# Patient Record
Sex: Male | Born: 1945
Health system: Southern US, Community
[De-identification: ages and names within clinical notes are randomized; demographics above are authoritative.]

## PROBLEM LIST (undated history)

## (undated) DIAGNOSIS — N529 Male erectile dysfunction, unspecified: Secondary | ICD-10-CM

## (undated) DIAGNOSIS — N200 Calculus of kidney: Secondary | ICD-10-CM

## (undated) DIAGNOSIS — R7303 Prediabetes: Secondary | ICD-10-CM

## (undated) DIAGNOSIS — K5730 Diverticulosis of large intestine without perforation or abscess without bleeding: Secondary | ICD-10-CM

## (undated) DIAGNOSIS — I10 Essential (primary) hypertension: Secondary | ICD-10-CM

## (undated) DIAGNOSIS — N201 Calculus of ureter: Secondary | ICD-10-CM

## (undated) DIAGNOSIS — N183 Chronic kidney disease, stage 3 unspecified: Secondary | ICD-10-CM

## (undated) DIAGNOSIS — D649 Anemia, unspecified: Secondary | ICD-10-CM

## (undated) DIAGNOSIS — E782 Mixed hyperlipidemia: Secondary | ICD-10-CM

## (undated) DIAGNOSIS — N401 Enlarged prostate with lower urinary tract symptoms: Secondary | ICD-10-CM

## (undated) DIAGNOSIS — Z972 Presence of dental prosthetic device (complete) (partial): Secondary | ICD-10-CM

## (undated) DIAGNOSIS — I4510 Unspecified right bundle-branch block: Secondary | ICD-10-CM

## (undated) DIAGNOSIS — Z8719 Personal history of other diseases of the digestive system: Secondary | ICD-10-CM

## (undated) DIAGNOSIS — M069 Rheumatoid arthritis, unspecified: Secondary | ICD-10-CM

## (undated) DIAGNOSIS — Z87898 Personal history of other specified conditions: Secondary | ICD-10-CM

## (undated) DIAGNOSIS — Z8639 Personal history of other endocrine, nutritional and metabolic disease: Secondary | ICD-10-CM

## (undated) DIAGNOSIS — Z87448 Personal history of other diseases of urinary system: Secondary | ICD-10-CM

## (undated) DIAGNOSIS — Z8739 Personal history of other diseases of the musculoskeletal system and connective tissue: Secondary | ICD-10-CM

## (undated) HISTORY — PX: TONSILLECTOMY: SUR1361

## (undated) HISTORY — PX: ANKLE SURGERY: SHX546

## (undated) HISTORY — DX: Essential (primary) hypertension: I10

## (undated) HISTORY — PX: INGUINAL HERNIA REPAIR: SUR1180

## (undated) HISTORY — PX: HEMATOMA EVACUATION: SHX5118

## (undated) HISTORY — PX: APPENDECTOMY: SHX54

---

## 1998-01-27 ENCOUNTER — Ambulatory Visit (HOSPITAL_BASED_OUTPATIENT_CLINIC_OR_DEPARTMENT_OTHER): Admission: RE | Admit: 1998-01-27 | Discharge: 1998-01-27 | Payer: Self-pay | Admitting: Orthopedic Surgery

## 2000-04-11 ENCOUNTER — Encounter: Admission: RE | Admit: 2000-04-11 | Discharge: 2000-04-11 | Payer: Self-pay | Admitting: *Deleted

## 2000-04-11 ENCOUNTER — Encounter: Payer: Self-pay | Admitting: *Deleted

## 2002-10-24 HISTORY — PX: PLANTAR FASCIA SURGERY: SHX746

## 2011-05-10 ENCOUNTER — Encounter: Payer: Self-pay | Admitting: Family Medicine

## 2011-05-10 DIAGNOSIS — I10 Essential (primary) hypertension: Secondary | ICD-10-CM | POA: Insufficient documentation

## 2011-05-10 DIAGNOSIS — E781 Pure hyperglyceridemia: Secondary | ICD-10-CM | POA: Insufficient documentation

## 2011-05-10 DIAGNOSIS — R739 Hyperglycemia, unspecified: Secondary | ICD-10-CM | POA: Insufficient documentation

## 2012-10-03 DIAGNOSIS — I10 Essential (primary) hypertension: Secondary | ICD-10-CM | POA: Diagnosis not present

## 2012-10-03 DIAGNOSIS — E785 Hyperlipidemia, unspecified: Secondary | ICD-10-CM | POA: Diagnosis not present

## 2012-10-03 DIAGNOSIS — R7309 Other abnormal glucose: Secondary | ICD-10-CM | POA: Diagnosis not present

## 2012-10-05 DIAGNOSIS — I10 Essential (primary) hypertension: Secondary | ICD-10-CM | POA: Diagnosis not present

## 2012-10-05 DIAGNOSIS — E785 Hyperlipidemia, unspecified: Secondary | ICD-10-CM | POA: Diagnosis not present

## 2012-10-05 DIAGNOSIS — Z125 Encounter for screening for malignant neoplasm of prostate: Secondary | ICD-10-CM | POA: Diagnosis not present

## 2012-12-22 ENCOUNTER — Encounter: Payer: Self-pay | Admitting: *Deleted

## 2012-12-22 ENCOUNTER — Encounter: Payer: Self-pay | Admitting: Physician Assistant

## 2013-03-19 ENCOUNTER — Encounter: Payer: Self-pay | Admitting: Physician Assistant

## 2013-04-11 ENCOUNTER — Encounter: Payer: Self-pay | Admitting: Physician Assistant

## 2013-04-11 ENCOUNTER — Ambulatory Visit (INDEPENDENT_AMBULATORY_CARE_PROVIDER_SITE_OTHER): Payer: Medicare Other | Admitting: Physician Assistant

## 2013-04-11 VITALS — BP 122/84 | HR 68 | Temp 98.0°F | Resp 18 | Ht 64.5 in | Wt 187.0 lb

## 2013-04-11 DIAGNOSIS — R7309 Other abnormal glucose: Secondary | ICD-10-CM | POA: Diagnosis not present

## 2013-04-11 DIAGNOSIS — R739 Hyperglycemia, unspecified: Secondary | ICD-10-CM

## 2013-04-11 DIAGNOSIS — E785 Hyperlipidemia, unspecified: Secondary | ICD-10-CM

## 2013-04-11 DIAGNOSIS — I10 Essential (primary) hypertension: Secondary | ICD-10-CM | POA: Diagnosis not present

## 2013-04-11 LAB — COMPLETE METABOLIC PANEL WITH GFR
ALT: 20 U/L (ref 0–53)
AST: 18 U/L (ref 0–37)
Albumin: 4.8 g/dL (ref 3.5–5.2)
Alkaline Phosphatase: 52 U/L (ref 39–117)
BUN: 16 mg/dL (ref 6–23)
CO2: 29 mEq/L (ref 19–32)
Calcium: 11.1 mg/dL — ABNORMAL HIGH (ref 8.4–10.5)
Chloride: 104 mEq/L (ref 96–112)
Creat: 1.41 mg/dL — ABNORMAL HIGH (ref 0.50–1.35)
GFR, Est African American: 60 mL/min
GFR, Est Non African American: 52 mL/min — ABNORMAL LOW
Glucose, Bld: 95 mg/dL (ref 70–99)
Potassium: 4.6 mEq/L (ref 3.5–5.3)
Sodium: 142 mEq/L (ref 135–145)
Total Bilirubin: 0.7 mg/dL (ref 0.3–1.2)
Total Protein: 7.3 g/dL (ref 6.0–8.3)

## 2013-04-11 LAB — HEMOGLOBIN A1C
Hgb A1c MFr Bld: 5.7 % — ABNORMAL HIGH (ref <5.7)
Mean Plasma Glucose: 117 mg/dL — ABNORMAL HIGH (ref <117)

## 2013-04-11 LAB — LIPID PANEL
Cholesterol: 231 mg/dL — ABNORMAL HIGH (ref 0–200)
HDL: 39 mg/dL — ABNORMAL LOW (ref >39)
LDL Cholesterol: 141 mg/dL — ABNORMAL HIGH (ref 0–99)
Total CHOL/HDL Ratio: 5.9 Ratio
Triglycerides: 256 mg/dL — ABNORMAL HIGH (ref <150)
VLDL: 51 mg/dL — ABNORMAL HIGH (ref 0–40)

## 2013-04-11 MED ORDER — AMLODIPINE BESYLATE 10 MG PO TABS: 10.0000 mg | ORAL_TABLET | Freq: Every day | ORAL | Status: DC

## 2013-04-11 MED ORDER — BENAZEPRIL HCL 20 MG PO TABS: 20.0000 mg | ORAL_TABLET | Freq: Every day | ORAL | Status: DC

## 2013-04-11 MED ORDER — METOPROLOL SUCCINATE ER 25 MG PO TB24: 25.0000 mg | ORAL_TABLET | Freq: Every day | ORAL | Status: DC

## 2013-04-11 MED ORDER — PRAVASTATIN SODIUM 20 MG PO TABS: 20.0000 mg | ORAL_TABLET | Freq: Every day | ORAL | Status: DC

## 2013-04-12 ENCOUNTER — Ambulatory Visit: Payer: Self-pay | Admitting: Physician Assistant

## 2013-04-12 ENCOUNTER — Telehealth: Payer: Self-pay | Admitting: Family Medicine

## 2013-04-12 DIAGNOSIS — Z79899 Other long term (current) drug therapy: Secondary | ICD-10-CM

## 2013-04-12 DIAGNOSIS — E785 Hyperlipidemia, unspecified: Secondary | ICD-10-CM

## 2013-04-12 MED ORDER — PRAVASTATIN SODIUM 40 MG PO TABS: 40.0000 mg | ORAL_TABLET | Freq: Every day | ORAL | Status: DC

## 2013-04-12 NOTE — Progress Notes (Signed)
Patient ID: Mike Davila MRN: [id number], DOB: 09/19/1946, 67 y.o. Date of Encounter: @DATE @  Chief Complaint:  Chief Complaint  Patient presents with  . 6 mth check up    is fasting    HPI: 67 y.o. year old male  presents for routine f/u OV. He has no complaints. Has been feeling good. Runs a dairy farm. At [place] he told me he had decided to "enjoy life"- goes bowling on Mondays and plays golf once a week. Today he says he is still doing these activities. "Feels great".   1-HTN: Taking all 3 BP meds as directed. No adverse effects. He checks BP at home. Mostly gets 120s80s. Highest 145/90. 2-HLD: Taking Pravastatin as directed. No myalgias or other adv effects. Still eats a lot of fried foods. Have discussed this numerous times and "he just cant give that up." ! 3- Hyperglycemia: This became a new diagnosis 09/2012. On htose labs, fasting glucose was 109. A1C was 6.1. At [place] 12/13 he told me he was eating 2-3 fudge bars per day. Today he tells me he has pretty much stopped those fudge bars. Also, has stoped all candy. Has stopped sodas-was drinking 4-5 per day. Now drinking only water. Also, was drinking a lot of OJ. Now just drinks one 8 oz glass each morning. Eats vry little bread/sandwiches. Some potatoes but not a lot. Minimal pasta.   Even with exertion, has no chest pressure, heaviness, tightness. No SOB/DOE.   Past Medical History  Diagnosis Date  . Hypertension   . Hyperglycemia   . Palpitations   . History of tobacco use   . Hernia     2  . Hematoma of leg     left leg  . Hyperlipidemia      Home Meds: See attached medication section for current medication list. Any medications entered into computer today will not appear on this note's list. The medications listed below were entered prior to today. Current Outpatient Prescriptions on File Prior to Visit  Medication Sig Dispense Refill  . ibuprofen (ADVIL,MOTRIN) 100 MG tablet Take 100 mg by mouth every 6 (six) hours  as needed.        . sildenafil (VIAGRA) 100 MG tablet Take 100 mg by mouth daily as needed.         No current facility-administered medications on file prior to visit.    Allergies:  Allergies  Allergen Reactions  . Crestor (Rosuvastatin Calcium)   . Lipitor (Atorvastatin Calcium)   . Sulfa Antibiotics   . Tetanus Toxoids     History   Social History  . Marital Status: Single    Spouse Name: N/A    Number of Children: N/A  . Years of Education: N/A   Occupational History  . Not on file.   Social History Main Topics  . Smoking status: Former Research scientist (life sciences)  . Smokeless tobacco: Not on file  . Alcohol Use: Not on file  . Drug Use: Not on file  . Sexually Active: Not on file   Other Topics Concern  . Not on file   Social History Narrative  . No narrative on file    Family History  Problem Relation Age of Onset  . Heart disease Father     MI     Review of Systems:  See HPI for pertinent ROS. All other ROS negative.    Physical Exam: Blood pressure 122/84, pulse 68, temperature 98 F (36.7 C), temperature source Oral, resp. rate 18, height  5' 4.5" (1.638 m), weight 187 lb (84.823 kg)., Body mass index is 31.61 kg/(m^2). General: WNWD WM. Appears in no acute distress. Neck: Supple. No thyromegaly. No lymphadenopathy. No carotid bruits.  Lungs: Clear bilaterally to auscultation without wheezes, rales, or rhonchi. Breathing is unlabored. Heart: RRR with S1 S2. No murmurs, rubs, or gallops. Abdomen: Soft, non-tender, non-distended with normoactive bowel sounds. No hepatomegaly. No rebound/guarding. No obvious abdominal masses. Musculoskeletal:  Strength and tone normal for age. Extremities/Skin: Warm and dry. No clubbing or cyanosis. No edema. No rashes or suspicious lesions. Neuro: Alert and oriented X 3. Moves all extremities spontaneously. Gait is normal. CNII-XII grossly in tact. Psych:  Responds to questions appropriately with a normal affect.     ASSESSMENT AND  PLAN:  67 y.o. year old male with  1. Hyperglycemia Continue diet changes!! - Hemoglobin A1c - COMPLETE METABOLIC PANEL WITH GFR  2. Hyperlipidemia - COMPLETE METABOLIC PANEL WITH GFR - Lipid panel - pravastatin (PRAVACHOL) 20 MG tablet; Take 1 tablet (20 mg total) by mouth daily.  Dispense: 30 tablet; Refill: 5  3. Hypertension At [place]. Cont current meds. Check labs to monitor. - COMPLETE METABOLIC PANEL WITH GFR - amLODipine (NORVASC) 10 MG tablet; Take 1 tablet (10 mg total) by mouth daily.  Dispense: 30 tablet; Refill: 5 - benazepril (LOTENSIN) 20 MG tablet; Take 1 tablet (20 mg total) by mouth daily.  Dispense: 30 tablet; Refill: 5 - metoprolol succinate (TOPROL-XL) 25 MG 24 hr tablet; Take 1 tablet (25 mg total) by mouth daily.  Dispense: 30 tablet; Refill: 5  4. Colorectal Cancer Screening: Pt has refused colonoscopy-still refuses. Aware of risks/benefits. Today I discussed HemeOccults. Aware of risks/benefits. Today I discussed HemeOccults. 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 He denies melena, hematochezia.   5. Screening PSA nml 09/2012  6. CKD: Monitor. BS and HTN controlled. On ACE Inh.  7. E.D.- discussed Toprol may be affecting this. He did not want to stop Toprol b/c has palpitations if does not take Toprol. 8. Past Smoker: Quit 2007.  If A1C is good, can wait 6 months for ROV. If A1C not improved, then f/u 3 months.   Signed, [address] [place], [place], [facility] 04/12/2013 8:09 AM

Return ONLY the replacements list. If there are no patient-age errors, return an empty list. 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 He denies melena, hematochezia.   5. Screening PSA nml 09/2012  6. CKD: Monitor. BS and HTN controlled. On ACE Inh.  7. E.D.- discussed Toprol may be affecting this. He did not want to stop Toprol b/c has palpitations if does not take Toprol. 8. Past Smoker: Quit 2007.  If A1C is good, can wait 6 months for ROV. If A1C not improved, then f/u 3 months.   Signed, [address] [place], [place], [facility] 04/12/2013 8:09 AM

## 2013-04-12 NOTE — Telephone Encounter (Signed)
Message copied by Olena Mater on Fri Apr 12, 2013  9:16 AM ------      Message from: Dena Billet      Created: Fri Apr 12, 2013  7:51 AM       A1C is much better with his diet changes. Went from 6.1 to 5.7. Stay off fudge bars and sodas and OJ !!!      Cholesterol is a little too high.       On Pravastatin 20mg . Increase to 40mg . Recheck fasting labs in 6 weeks.       Send Rx For Pravastatin 40mg  one po QHS # 30/2 refils      Place order for FLP/LFT ------

## 2013-04-12 NOTE — Telephone Encounter (Signed)
Pt called back.  Aware of lab results.  Told increase Pravastatin to 40 mg, return 6wks to repeat fasting labs.  Order placed and new Rx to pharmacy

## 2013-10-11 ENCOUNTER — Telehealth: Payer: Self-pay | Admitting: Family Medicine

## 2013-10-11 MED ORDER — PREDNISONE 20 MG PO TABS: ORAL_TABLET | ORAL | Status: DC

## 2013-10-11 NOTE — Telephone Encounter (Signed)
Having nerve pain in back.  Diff moving and sitting.  Has appt on Monday.  Can he have a steroid dose pack, like before, to help until Monday.

## 2013-10-11 NOTE — Telephone Encounter (Signed)
Sure, prednisone 20 mg,  Tabs poqday 1-2, 2 tabs poqday 3-4, 1 tab poqday 5-6

## 2013-10-11 NOTE — Telephone Encounter (Signed)
Pt informed, Rx to pharmacy

## 2013-10-14 ENCOUNTER — Ambulatory Visit (INDEPENDENT_AMBULATORY_CARE_PROVIDER_SITE_OTHER): Payer: Medicare Other | Admitting: Physician Assistant

## 2013-10-14 ENCOUNTER — Encounter: Payer: Self-pay | Admitting: Physician Assistant

## 2013-10-14 ENCOUNTER — Ambulatory Visit
Admission: RE | Admit: 2013-10-14 | Discharge: 2013-10-14 | Disposition: A | Discharge status: Home / Self Care | Payer: Medicare Other | Source: Ambulatory Visit | Attending: Physician Assistant | Admitting: Physician Assistant

## 2013-10-14 VITALS — BP 114/78 | HR 68 | Temp 98.7°F | Resp 18 | Wt 195.0 lb

## 2013-10-14 DIAGNOSIS — Z125 Encounter for screening for malignant neoplasm of prostate: Secondary | ICD-10-CM

## 2013-10-14 DIAGNOSIS — I10 Essential (primary) hypertension: Secondary | ICD-10-CM | POA: Diagnosis not present

## 2013-10-14 DIAGNOSIS — R7309 Other abnormal glucose: Secondary | ICD-10-CM | POA: Diagnosis not present

## 2013-10-14 DIAGNOSIS — M47817 Spondylosis without myelopathy or radiculopathy, lumbosacral region: Secondary | ICD-10-CM | POA: Diagnosis not present

## 2013-10-14 DIAGNOSIS — M5431 Sciatica, right side: Secondary | ICD-10-CM

## 2013-10-14 DIAGNOSIS — E785 Hyperlipidemia, unspecified: Secondary | ICD-10-CM | POA: Diagnosis not present

## 2013-10-14 DIAGNOSIS — R739 Hyperglycemia, unspecified: Secondary | ICD-10-CM

## 2013-10-14 DIAGNOSIS — M5430 Sciatica, unspecified side: Secondary | ICD-10-CM

## 2013-10-14 DIAGNOSIS — E781 Pure hyperglyceridemia: Secondary | ICD-10-CM

## 2013-10-14 LAB — COMPLETE METABOLIC PANEL WITH GFR
AST: 11 U/L (ref 0–37)
Albumin: 4.6 g/dL (ref 3.5–5.2)
BUN: 28 mg/dL — ABNORMAL HIGH (ref 6–23)
Calcium: 10.1 mg/dL (ref 8.4–10.5)
Chloride: 103 mEq/L (ref 96–112)
Creat: 1.34 mg/dL (ref 0.50–1.35)
GFR, Est Non African American: 55 mL/min — ABNORMAL LOW
Glucose, Bld: 97 mg/dL (ref 70–99)

## 2013-10-14 LAB — PSA, MEDICARE: PSA: 2.57 ng/mL (ref <=4.00)

## 2013-10-14 LAB — LIPID PANEL
Cholesterol: 204 mg/dL — ABNORMAL HIGH (ref 0–200)
Triglycerides: 269 mg/dL — ABNORMAL HIGH (ref <150)
VLDL: 54 mg/dL — ABNORMAL HIGH (ref 0–40)

## 2013-10-14 LAB — HEMOGLOBIN A1C: Mean Plasma Glucose: 123 mg/dL — ABNORMAL HIGH (ref <117)

## 2013-10-14 MED ORDER — METHYLPREDNISOLONE ACETATE 80 MG/ML IJ SUSP
80.0000 mg | Freq: Once | INTRAMUSCULAR | Status: AC
  Administered 2013-10-14: 80 mg via INTRAMUSCULAR

## 2013-10-14 NOTE — Progress Notes (Signed)
Patient ID: Mike Davila MRN: [id number], DOB: 02/20/1946, 67 y.o. Date of Encounter: @DATE @  Chief Complaint:  Chief Complaint  Patient presents with  . 6 mth check up    c/o sciatica pain, is fasting    HPI: 67 y.o. year old white male  presents for routine followup.  Also, he reports that he is having significant right sciatica. He says that the pain started on the morning of Wednesday 10/09/13. He said he had done nothing out of the ordinary the day before or at the time of the pain onset. He says that he noticed the pain that morning. He left that evening for a trip to [place] with other men. Says he had to sit for 13-1/2 hours to get there. He says that he really been looking forward to this trip and really did not want to miss the trip--so,  he was trying to get through it. However, the pain was so bad that they were only in [place] for 4 hours before they all decided they would just have to come back home because of his pain. He called here on Friday 10/11/13. We called him in a prednisone pack. He says that the pain is some better but not resolved. he is concerned it is not going to completely resolve by the time he completes the pack. He says that this is the third time he has had sciatica. He says that the last episode was probably about 3 or 4 years ago. He says that he can feel the pain from his right low back down to the right buttock- significant pain there- and it goes down the right leg to the level of the calf.  Says he only feels this when he is sitting. Says he has no problem when he is lying down standing or walking. 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 He has had no weakness in the leg. No loss of bowel or bladder  function/incontinent  Hypertention: He is taking all of her blood pressure medications as directed. No adverse effects.  Hyperlipidemia: He is taking pravastatin as directed. No myalgias or other adverse effects. Still it a lot of fried foods. We have discussed this numerous times and "he just can't give that up"!  Hyperglycemia: This became a new diagnosis 09/2012. On those labs fasting glucose is 109. A1c was 6.1. At [place] 09/2012 he told me he was eating 2-3 fudge bars per day. After that, he pretty much stopped eating fudge bars. He had stopped eating candy in general. As well in the past he had been drinking 4-5 sodas per day. He changed this to drinking only water. Also in the past he was drinking a lot of orange juice. Now he has been drinking just one 8 ounce glasses each morning. He says that this is still about the same. Again today he tells me the same as in the past regarding eating very little bread/sandwiches. He eats some potatoes but not a lot. Eats minimal pasta. Says his wife is a very good cook and he really loves to eat. Says he does drink quite a bit of milk but eats little ice cream.  When I was  discussing the orange juice and sugar content he did admit that yesterday while watching TV he ate 7 or 8 tangerines.!!   Past Medical History  Diagnosis Date  . Hypertension   . Hyperglycemia   . Palpitations   . History of tobacco use   . Hernia     2  . Hematoma of leg     left leg  . Hyperlipidemia      Home Meds: See attached medication section for current medication list. Any medications entered into computer today will not appear on this note's list. The medications listed below were entered prior to today. Current Outpatient Prescriptions on File Prior to Visit  Medication Sig Dispense Refill  . amLODipine (NORVASC) 10 MG tablet Take 1 tablet (10 mg total) by mouth daily.  30 tablet  5  . benazepril (LOTENSIN) 20 MG tablet Take 1 tablet (20 mg total) by mouth daily.   30 tablet  5  . ibuprofen (ADVIL,MOTRIN) 100 MG tablet Take 100 mg by mouth every 6 (six) hours as needed.        . metoprolol succinate (TOPROL-XL) 25 MG 24 hr tablet Take 1 tablet (25 mg total) by mouth daily.  30 tablet  5  . pravastatin (PRAVACHOL) 40 MG tablet Take 1 tablet (40 mg total) by mouth daily.  90 tablet  0  . predniSONE (DELTASONE) 20 MG tablet Three tablets by mouth on day 1-2, two tablets by mouth on day 3-4, one tablet by mouth on day 5-6.  12 tablet  0  . sildenafil (VIAGRA) 100 MG tablet Take 100 mg by mouth daily as needed.         No current facility-administered medications on file prior to visit.    Allergies:  Allergies  Allergen Reactions  . Crestor [Rosuvastatin Calcium]   . Lipitor [Atorvastatin Calcium]   . Sulfa Antibiotics   . Tetanus Toxoids     History   Social History  . Marital Status: Single    Spouse Name: N/A    Number of Children: N/A  . Years of Education: N/A   Occupational History  . Not on file.   Social History Main Topics  . Smoking status: Former Research scientist (life sciences)  . Smokeless tobacco: Not on file  . Alcohol Use: Not on file  . Drug Use: Not on file  . Sexual Activity: Not on file   Other Topics Concern  . Not on file   Social History Narrative  . No narrative on file    Family History  Problem Relation Age of Onset  . Heart disease Father     MI     Review of Systems:  See HPI for pertinent ROS. All other ROS negative.    Physical Exam: Blood pressure 114/78, pulse 68, temperature 98.7 F (37.1 C), temperature source Oral, resp. rate 18, weight 195 lb (88.451 kg)., Body mass index is 32.97 kg/(m^2). General: WNWD WM. Very Pleasant. Appears in no acute distress. Neck: Supple. No thyromegaly. No lymphadenopathy. No carotid bruits. Lungs: Clear bilaterally to auscultation without wheezes, rales, or rhonchi. Breathing is unlabored. Heart: RRR with S1 S2. No murmurs, rubs, or gallops. Abdomen: Soft, non-tender, non-distended  with normoactive bowel sounds. No hepatomegaly. No rebound/guarding. No obvious abdominal masses. Musculoskeletal:  Right low back: Mild tenderness with palpation of the right low back around L4 level. Significant pain with palpation of the right sciatic notch.  Bilateral straight leg raise and hip adduction are basically normal. He has 5 over  5 strength of the right lower stomach he and with flexion and extension of the at the ankle. Extremities/Skin: Warm and dry. No clubbing or cyanosis. No edema. No rashes or suspicious lesions. Neuro: Alert and oriented X 3. Moves all extremities spontaneously. Gait is normal. CNII-XII grossly in tact. Psych:  Responds to questions appropriately with a normal affect.     ASSESSMENT AND PLAN:  67 y.o. year old male with  1. Sciatica, right Will obtain x-ray to rule out pathology and assess the discs.  Go ahead and Depo-Medrol 80 mg IM now. Beginning tomorrow he can complete his oral prednisone taper. If all of his pain does not resolve with this, then he definitely needs to followup with me. I have discussed this with him at length and he voices understanding and agrees to follow up if his symptoms do not resolve after completion of this round of prednisone. - DG Lumbar Spine Complete; Future - methylPREDNISolone acetate (DEPO-MEDROL) injection 80 mg; Inject 1 mL (80 mg total) into the muscle once.  2. Hyperglycemia The history of present illness. He has made significant dietary changes that is still eating quite a bit of sugar. - COMPLETE METABOLIC PANEL WITH GFR - Hemoglobin A1c  3. Hypertension Blood pressure is at goal. Continue current medications. Check lab to monitor. - COMPLETE METABOLIC PANEL WITH GFR  4. Hyperlipidemia He is on pravastatin. This was increased from 20 mg to 40 mg on 04/12/13. Recheck labs to monitor. - COMPLETE METABOLIC PANEL WITH GFR - Lipid panel  5. Hypertriglyceridemia See #2 above as well as #4.  6. Prostate cancer  screening Last PSA was 12/13. Recheck now. - PSA, Medicare  7. Colorectal cancer screening: Patient has refused colonoscopy. Still refuses. Aware of risks/benefits. As well we have discussed Hemoccults. He refuses to do these. 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 He denies any melena or hematochezia.  8. CKD: Monitor. Blood sugar and hypertension and pain control. On ACE inhibitor.  9. ED: Discussed Toprol may be affecting this. He does not want to stop Toprol because has palpitations if he does not take Toprol.  10. Past smoker: Quit 2007. He says he has no problems with shortness of breath or wheezing ever since he stopped smoking. Does not need to start medications for COPD.  Regular office visit in 3-6 months depending on how this A1c looks today.   [address] [place], [place], [facility] 10/14/2013 9:34 AM

## 2013-11-13 ENCOUNTER — Other Ambulatory Visit: Payer: Self-pay | Admitting: Physician Assistant

## 2013-11-13 DIAGNOSIS — I10 Essential (primary) hypertension: Secondary | ICD-10-CM

## 2013-11-13 NOTE — Telephone Encounter (Signed)
Medication refilled per protocol. 

## 2014-04-17 ENCOUNTER — Other Ambulatory Visit: Payer: Self-pay | Admitting: Physician Assistant

## 2014-04-17 NOTE — Telephone Encounter (Signed)
Prescription sent to pharmacy.

## 2014-05-16 ENCOUNTER — Encounter: Payer: Self-pay | Admitting: Family Medicine

## 2014-05-16 ENCOUNTER — Other Ambulatory Visit: Payer: Self-pay | Admitting: Physician Assistant

## 2014-05-16 DIAGNOSIS — I10 Essential (primary) hypertension: Secondary | ICD-10-CM

## 2014-05-16 NOTE — Telephone Encounter (Signed)
Medication refill for one time only.  Patient needs to be seen.  Letter sent for patient to call and schedule 

## 2014-05-19 ENCOUNTER — Other Ambulatory Visit: Payer: Self-pay | Admitting: Physician Assistant

## 2014-05-19 NOTE — Telephone Encounter (Signed)
Medication filled x1 with no refills.   Requires office visit before any further refills can be given.   Letter sent.  

## 2014-06-12 ENCOUNTER — Encounter: Payer: Self-pay | Admitting: Physician Assistant

## 2014-06-12 ENCOUNTER — Ambulatory Visit (INDEPENDENT_AMBULATORY_CARE_PROVIDER_SITE_OTHER): Payer: Medicare Other | Admitting: Physician Assistant

## 2014-06-12 VITALS — BP 136/78 | HR 62 | Temp 98.2°F | Resp 14 | Ht 65.0 in | Wt 192.0 lb

## 2014-06-12 DIAGNOSIS — I10 Essential (primary) hypertension: Secondary | ICD-10-CM

## 2014-06-12 DIAGNOSIS — E785 Hyperlipidemia, unspecified: Secondary | ICD-10-CM | POA: Diagnosis not present

## 2014-06-12 DIAGNOSIS — R7309 Other abnormal glucose: Secondary | ICD-10-CM

## 2014-06-12 DIAGNOSIS — R739 Hyperglycemia, unspecified: Secondary | ICD-10-CM

## 2014-06-12 DIAGNOSIS — E781 Pure hyperglyceridemia: Secondary | ICD-10-CM | POA: Diagnosis not present

## 2014-06-12 LAB — LIPID PANEL
CHOL/HDL RATIO: 5.3 ratio
Cholesterol: 189 mg/dL (ref 0–200)
HDL: 36 mg/dL — AB (ref >39)
LDL CALC: 103 mg/dL — AB (ref 0–99)
Triglycerides: 249 mg/dL — ABNORMAL HIGH (ref <150)
VLDL: 50 mg/dL — AB (ref 0–40)

## 2014-06-12 LAB — COMPLETE METABOLIC PANEL WITH GFR
ALBUMIN: 4.5 g/dL (ref 3.5–5.2)
ALT: 32 U/L (ref 0–53)
AST: 22 U/L (ref 0–37)
Alkaline Phosphatase: 51 U/L (ref 39–117)
BUN: 16 mg/dL (ref 6–23)
CALCIUM: 11.4 mg/dL — AB (ref 8.4–10.5)
CHLORIDE: 103 meq/L (ref 96–112)
CO2: 29 meq/L (ref 19–32)
CREATININE: 1.45 mg/dL — AB (ref 0.50–1.35)
GFR, EST AFRICAN AMERICAN: 58 mL/min — AB
GFR, Est Non African American: 50 mL/min — ABNORMAL LOW
Glucose, Bld: 93 mg/dL (ref 70–99)
Potassium: 3.8 mEq/L (ref 3.5–5.3)
Sodium: 142 mEq/L (ref 135–145)
TOTAL PROTEIN: 7.4 g/dL (ref 6.0–8.3)
Total Bilirubin: 0.5 mg/dL (ref 0.2–1.2)

## 2014-06-12 LAB — HEMOGLOBIN A1C
Hgb A1c MFr Bld: 5.9 % — ABNORMAL HIGH (ref <5.7)
MEAN PLASMA GLUCOSE: 123 mg/dL — AB (ref <117)

## 2014-06-12 MED ORDER — METOPROLOL SUCCINATE ER 25 MG PO TB24: ORAL_TABLET | ORAL | Status: DC

## 2014-06-12 MED ORDER — BENAZEPRIL HCL 20 MG PO TABS: ORAL_TABLET | ORAL | Status: DC

## 2014-06-12 MED ORDER — AMLODIPINE BESYLATE 10 MG PO TABS: ORAL_TABLET | ORAL | Status: DC

## 2014-06-12 MED ORDER — PRAVASTATIN SODIUM 40 MG PO TABS: 40.0000 mg | ORAL_TABLET | Freq: Every day | ORAL | Status: DC

## 2014-06-12 NOTE — Progress Notes (Signed)
Patient ID: Mike Davila MRN: [id number], DOB: 02-Oct-1946, 68 y.o. Date of Encounter: @DATE @  Chief Complaint:  Chief Complaint  Patient presents with  . 6 month F/U    is fasting    HPI: 68 y.o. year old white male  presents for routine followup.  Hypertention: He is taking all of her blood pressure medications as directed. No adverse effects.  Hyperlipidemia: He is taking pravastatin as directed. No myalgias or other adverse effects. Still it a lot of fried foods. We have discussed this numerous times and "he just can't give that up"!  Hyperglycemia: This became a new diagnosis 09/2012. On those labs fasting glucose is 109. A1c was 6.1. At [place] 09/2012 he told me he was eating 2-3 fudge bars per day. After that, he pretty much stopped eating fudge bars. He had stopped eating candy in general. As well in the past he had been drinking 4-5 sodas per day. He changed this to drinking only water. Also in the past he was drinking a lot of orange juice. At [place] 09/2013 he said he has been drinking just one 8 ounce glasses each morning.   Again today he tells me the same as in the past regarding eating very little bread/sandwiches. He eats some potatoes but not a lot. Eats minimal pasta. Says his wife is a very good cook and he really loves to eat. In past he said he was drinking quite a bit of milk but eats little ice cream.  Today- 05/2014-- he says that he has stayed off of this sodas. Has stopped the orange juice. Now is drinking very little milk. Says that he basically drinks only water. Drinks water at breakfast lunch supper and throughout the day !! Says the only bread he eats is on sandwiches. Admits that he is does still enjoy his fried squash and has eaten this a lot over the summer. Says his wife works for the school system so she has been off for the summer it has been there to cook. She has just started back to work so she is not there to cook the big lunches that they were  having over the summer.     Past Medical History  Diagnosis Date  . Hypertension   . Hyperglycemia   . Palpitations   . History of tobacco use   . Hernia     2  . Hematoma of leg     left leg  . Hyperlipidemia      Home Meds:  Outpatient Prescriptions Prior to Visit  Medication Sig Dispense Refill  . amLODipine (NORVASC) 10 MG tablet TAKE 1 TABLET (10 MG TOTAL) BY MOUTH DAILY.  30 tablet  0  . benazepril (LOTENSIN) 20 MG tablet TAKE 1 TABLET BY MOUTH EVERY DAY  30 tablet  0  . ibuprofen (ADVIL,MOTRIN) 100 MG tablet Take 100 mg by mouth every 6 (six) hours as needed.        . metoprolol succinate (TOPROL-XL) 25 MG 24 hr tablet TAKE 1 TABLET (25 MG TOTAL) BY MOUTH DAILY.  30 tablet  0  . pravastatin (PRAVACHOL) 40 MG tablet Take 1 tablet (40 mg total) by mouth daily.  90 tablet  0  . sildenafil (VIAGRA) 100 MG tablet Take 100 mg by mouth daily as needed.        . predniSONE (DELTASONE) 20 MG tablet Three tablets by mouth on day 1-2, two tablets by mouth on day 3-4, one tablet by mouth on  day 5-6.  12 tablet  0   No facility-administered medications prior to visit.     Allergies:  Allergies  Allergen Reactions  . Crestor [Rosuvastatin Calcium]   . Lipitor [Atorvastatin Calcium]   . Sulfa Antibiotics   . Tetanus Toxoids     History   Social History  . Marital Status: Single    Spouse Name: N/A    Number of Children: N/A  . Years of Education: N/A   Occupational History  . Not on file.   Social History Main Topics  . Smoking status: Former Research scientist (life sciences)  . Smokeless tobacco: Current User    Types: Chew  . Alcohol Use: No  . Drug Use: No  . Sexual Activity: Yes   Other Topics Concern  . Not on file   Social History Narrative   Married.   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    Family History  Problem Relation Age of Onset  . Heart disease Father     MI     Review of Systems:  See HPI for pertinent ROS. All other ROS negative.    Physical Exam: Blood pressure 136/78, pulse  62, temperature 98.2 F (36.8 C), temperature source Oral, resp. rate 14, height 5\' 5"  (1.651 m), weight 192 lb (87.091 kg)., Body mass index is 31.95 kg/(m^2). General: WNWD WM. Very Pleasant. Appears in no acute distress. Neck: Supple. No thyromegaly. No lymphadenopathy. No carotid bruits. Lungs: Clear bilaterally to auscultation without wheezes, rales, or rhonchi. Breathing is unlabored. Heart: RRR with S1 S2. No murmurs, rubs, or gallops. Abdomen: Soft, non-tender, non-distended with normoactive bowel sounds. No hepatomegaly. No rebound/guarding. No obvious abdominal masses. Musculoskeletal: Strength and tone normal for age.  Extremities/Skin: Warm and dry. No edema.  Neuro: Alert and oriented X 3. Moves all extremities spontaneously. Gait is normal. CNII-XII grossly in tact. Psych:  Responds to questions appropriately with a normal affect.     ASSESSMENT AND PLAN:  68 y.o. year old male with   1. Hyperlipidemia - COMPLETE METABOLIC PANEL WITH GFR - Lipid panel  2. Hypertriglyceridemia - COMPLETE METABOLIC PANEL WITH GFR - Lipid panel  3. Hyperglycemia - COMPLETE METABOLIC PANEL WITH GFR - Hemoglobin A1c  4. Essential hypertension Blood pressure is at goal. Check labs to monitor. Continue current medications. - COMPLETE METABOLIC PANEL WITH GFR   5. Prostate cancer screening Last PSA was 09/2013. Normal.   7. Colorectal cancer screening: Patient has refused colonoscopy. Still refuses. Aware of risks/benefits. As well we have discussed Hemoccults. He refuses to do these. 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 He denies any melena or hematochezia.  8. CKD: Monitor. Blood sugar and hypertension and pain control. On ACE inhibitor.  9. ED: Discussed Toprol may be affecting this. He does not want to stop Toprol because has palpitations if he does not take Toprol.  10. Past smoker:  Quit 2007. He says he has no problems with shortness of breath or wheezing ever since he stopped smoking. Does not need to start medications for COPD.  11. Immunizations:  Discussed pneumonia vaccines. He defers. 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  Regular office visit in 6 months. Follow up sooner if needed.   Signed, [address] [place], [place], [facility] 06/12/2014 9:12 AM

## 2014-06-13 ENCOUNTER — Encounter: Payer: Self-pay | Admitting: *Deleted

## 2014-11-10 ENCOUNTER — Telehealth: Payer: Self-pay | Admitting: Family Medicine

## 2014-11-10 NOTE — Telephone Encounter (Signed)
[phone] [facility] [place] Pt is needing a refill on her prednisone because he has hurt his back

## 2014-11-11 MED ORDER — PREDNISONE 20 MG PO TABS: ORAL_TABLET | ORAL | Status: DC

## 2014-11-11 NOTE — Telephone Encounter (Signed)
Has twisted back again and having sciatica pain.  Says steroid dose pack always helps.   Can he get RX for that??

## 2014-11-11 NOTE — Telephone Encounter (Signed)
Rx to pharmacy and pt is aware.

## 2014-11-11 NOTE — Telephone Encounter (Signed)
Ok 60x2, 40x2, 20x2 of prednisone

## 2014-12-01 ENCOUNTER — Encounter: Payer: Self-pay | Admitting: Family Medicine

## 2014-12-01 ENCOUNTER — Other Ambulatory Visit: Payer: Self-pay | Admitting: Physician Assistant

## 2014-12-01 DIAGNOSIS — I10 Essential (primary) hypertension: Secondary | ICD-10-CM

## 2014-12-01 NOTE — Telephone Encounter (Signed)
Medication refill for one time only.  Patient needs to be seen.  Letter sent for patient to call and schedule 

## 2014-12-17 ENCOUNTER — Telehealth: Payer: Self-pay | Admitting: Family Medicine

## 2014-12-17 DIAGNOSIS — I10 Essential (primary) hypertension: Secondary | ICD-10-CM

## 2014-12-17 MED ORDER — METOPROLOL SUCCINATE ER 25 MG PO TB24: ORAL_TABLET | ORAL | Status: DC

## 2014-12-17 MED ORDER — BENAZEPRIL HCL 20 MG PO TABS: 20.0000 mg | ORAL_TABLET | Freq: Every day | ORAL | Status: DC

## 2014-12-17 MED ORDER — AMLODIPINE BESYLATE 10 MG PO TABS: ORAL_TABLET | ORAL | Status: DC

## 2014-12-17 NOTE — Telephone Encounter (Signed)
Pt has appt 12/23/14  One mth refill each sent

## 2014-12-17 NOTE — Telephone Encounter (Signed)
[phone]  Pt is calling because he is needing a refill on  amLODipine (NORVASC) 10 MG tablet    benazepril (LOTENSIN) 20 MG tablet       metoprolol succinate (TOPROL-XL) 25 MG 24 hr tablet [facility] [place]

## 2014-12-23 ENCOUNTER — Ambulatory Visit (INDEPENDENT_AMBULATORY_CARE_PROVIDER_SITE_OTHER): Payer: Medicare Other | Admitting: Physician Assistant

## 2014-12-23 ENCOUNTER — Encounter: Payer: Self-pay | Admitting: Family Medicine

## 2014-12-23 ENCOUNTER — Encounter: Payer: Self-pay | Admitting: Physician Assistant

## 2014-12-23 VITALS — BP 130/86 | HR 60 | Temp 97.6°F | Resp 18 | Wt 194.0 lb

## 2014-12-23 DIAGNOSIS — E785 Hyperlipidemia, unspecified: Secondary | ICD-10-CM

## 2014-12-23 DIAGNOSIS — R7309 Other abnormal glucose: Secondary | ICD-10-CM | POA: Diagnosis not present

## 2014-12-23 DIAGNOSIS — R739 Hyperglycemia, unspecified: Secondary | ICD-10-CM

## 2014-12-23 DIAGNOSIS — I10 Essential (primary) hypertension: Secondary | ICD-10-CM

## 2014-12-23 DIAGNOSIS — E781 Pure hyperglyceridemia: Secondary | ICD-10-CM | POA: Diagnosis not present

## 2014-12-23 DIAGNOSIS — Z125 Encounter for screening for malignant neoplasm of prostate: Secondary | ICD-10-CM

## 2014-12-23 LAB — COMPLETE METABOLIC PANEL WITH GFR
ALT: 43 U/L (ref 0–53)
AST: 26 U/L (ref 0–37)
Albumin: 4.8 g/dL (ref 3.5–5.2)
Alkaline Phosphatase: 64 U/L (ref 39–117)
BUN: 19 mg/dL (ref 6–23)
CO2: 28 meq/L (ref 19–32)
CREATININE: 1.41 mg/dL — AB (ref 0.50–1.35)
Calcium: 11.8 mg/dL — ABNORMAL HIGH (ref 8.4–10.5)
Chloride: 100 mEq/L (ref 96–112)
GFR, Est African American: 59 mL/min — ABNORMAL LOW
GFR, Est Non African American: 51 mL/min — ABNORMAL LOW
Glucose, Bld: 88 mg/dL (ref 70–99)
Potassium: 4.1 mEq/L (ref 3.5–5.3)
Sodium: 144 mEq/L (ref 135–145)
TOTAL PROTEIN: 7.1 g/dL (ref 6.0–8.3)
Total Bilirubin: 0.7 mg/dL (ref 0.2–1.2)

## 2014-12-23 LAB — LIPID PANEL
CHOLESTEROL: 179 mg/dL (ref 0–200)
HDL: 36 mg/dL — ABNORMAL LOW (ref >=40)
LDL Cholesterol: 102 mg/dL — ABNORMAL HIGH (ref 0–99)
Total CHOL/HDL Ratio: 5 Ratio
Triglycerides: 205 mg/dL — ABNORMAL HIGH (ref <150)
VLDL: 41 mg/dL — AB (ref 0–40)

## 2014-12-23 LAB — HEMOGLOBIN A1C
Hgb A1c MFr Bld: 6.3 % — ABNORMAL HIGH (ref <5.7)
Mean Plasma Glucose: 134 mg/dL — ABNORMAL HIGH (ref <117)

## 2014-12-23 NOTE — Progress Notes (Signed)
Patient ID: KIDUS BESTUL MRN: [id number], DOB: 09/11/1946, 69 y.o. Date of Encounter: @DATE @  Chief Complaint:  Chief Complaint  Patient presents with  . 6 mth check up    is fasting, req check PSA    HPI: 69 y.o. year old white male  presents for routine followup.  Hypertention: He is taking all of her blood pressure medications as directed. No adverse effects.  Hyperlipidemia: He is taking pravastatin as directed. No myalgias or other adverse effects. Still it a lot of fried foods. We have discussed this numerous times and "he just can't give that up"!  Hyperglycemia: This became a new diagnosis 09/2012. On those labs fasting glucose is 109. A1c was 6.1. At [place] 09/2012 he told me he was eating 2-3 fudge bars per day. After that, he pretty much stopped eating fudge bars. He had stopped eating candy in general. As well in the past he had been drinking 4-5 sodas per day. He changed this to drinking only water. Also in the past he was drinking a lot of orange juice. At [place] 09/2013 he said he has been drinking just one 8 ounce glasses each morning.   Again today he tells me the same as in the past regarding eating very little bread/sandwiches. He eats some potatoes but not a lot. Eats minimal pasta. Says his wife is a very good cook and he really loves to eat. In past he said he was drinking quite a bit of milk but eats little ice cream.  At [place]--- 05/2014-- he says that he has stayed off of this sodas. Has stopped the orange juice. Now is drinking very little milk. Says that he basically drinks only water. Drinks water at breakfast lunch supper and throughout the day !! Says the only bread he eats is on sandwiches. Admits that he is does still enjoy his fried squash and has eaten this a lot over the summer. Says his wife works for the school system so she has been off for the summer it has been there to cook. She has just started back to work so she is not there to cook the big lunches  that they were having over the summer.   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     Past Medical History  Diagnosis Date  . Hypertension   . Hyperglycemia   . Palpitations   . History of tobacco use   . Hernia     2  . Hematoma of leg     left leg  . Hyperlipidemia      Home Meds:  Outpatient Prescriptions Prior to Visit  Medication Sig Dispense Refill  . amLODipine (NORVASC) 10 MG tablet TAKE 1 TABLET (10 MG TOTAL) BY MOUTH DAILY. 30 tablet 0  . benazepril (LOTENSIN) 20 MG tablet Take 1 tablet (20 mg total) by mouth daily. 30 tablet 0  . ibuprofen (ADVIL,MOTRIN) 100 MG tablet Take 100 mg by mouth every 6 (six) hours as needed.      . metoprolol succinate (TOPROL-XL) 25 MG 24 hr tablet TAKE 1 TABLET (25 MG TOTAL) BY MOUTH DAILY. 30 tablet 0  . pravastatin (PRAVACHOL) 40 MG tablet Take 1 tablet (40 mg total) by mouth daily. 90 tablet 1  . sildenafil (VIAGRA) 100 MG tablet Take 100 mg by mouth daily as needed.      Marland Kitchen  predniSONE (DELTASONE) 20 MG tablet Take three tablets once daily for 2 days, then two tablets once daily for 2 days, then one tablet daily for 2 days 12 tablet 0   No facility-administered medications prior to visit.     Allergies:  Allergies  Allergen Reactions  . Crestor [Rosuvastatin Calcium]   . Lipitor [Atorvastatin Calcium]   . Sulfa Antibiotics   . Tetanus Toxoids     History   Social History  . Marital Status: Single    Spouse Name: N/A  . Number of Children: N/A  . Years of Education: N/A   Occupational History  . Not on file.   Social History Main Topics  . Smoking status: Former Research scientist (life sciences)  . Smokeless tobacco: Current User    Types: Chew  . Alcohol Use: No  . Drug Use: No  . Sexual Activity: Yes   Other Topics Concern  . Not on file   Social History Narrative   Married.   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    Family History  Problem Relation  Age of Onset  . Heart disease Father     MI     Review of Systems:  See HPI for pertinent ROS. All other ROS negative.    Physical Exam: Blood pressure 130/86, pulse 60, temperature 97.6 F (36.4 C), temperature source Oral, resp. rate 18, weight 194 lb (87.998 kg)., Body mass index is 32.28 kg/(m^2). General: WNWD WM. Very Pleasant. Appears in no acute distress. Neck: Supple. No thyromegaly. No lymphadenopathy. No carotid bruits. Lungs: Clear bilaterally to auscultation without wheezes, rales, or rhonchi. Breathing is unlabored. Heart: RRR with S1 S2. No murmurs, rubs, or gallops. Abdomen: Soft, non-tender, non-distended with normoactive bowel sounds. No hepatomegaly. No rebound/guarding. No obvious abdominal masses. Musculoskeletal: Strength and tone normal for age.  Extremities/Skin: Warm and dry. No edema.  Neuro: Alert and oriented X 3. Moves all extremities spontaneously. Gait is normal. CNII-XII grossly in tact. Psych:  Responds to questions appropriately with a normal affect.     ASSESSMENT AND PLAN:  69 y.o. year old male with   1. Hyperlipidemia - COMPLETE METABOLIC PANEL WITH GFR - Lipid panel  2. Hypertriglyceridemia - COMPLETE METABOLIC PANEL WITH GFR - Lipid panel  3. Hyperglycemia - COMPLETE METABOLIC PANEL WITH GFR - Hemoglobin A1c  4. Essential hypertension Blood pressure is at goal. Check labs to monitor. Continue current medications. - COMPLETE METABOLIC PANEL WITH GFR   5. Prostate cancer screening Last PSA was 09/2013. Normal. Recheck PSA now---12/23/2014   7. Colorectal cancer screening: Patient has refused colonoscopy. Still refuses. Aware of risks/benefits. As well we have discussed Hemoccults. He refuses to do these. 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 He denies any melena or hematochezia.  8. CKD: Monitor. Blood sugar and hypertension and pain  control. On ACE inhibitor.  9. ED: Discussed Toprol may be affecting this. He does not want to stop Toprol because has palpitations if he does not take Toprol.  10. Past smoker: Quit 2007. He says he has no problems with shortness of breath or wheezing ever since he stopped smoking. Does not need to start medications for COPD.  11. Immunizations:  Discussed pneumonia vaccines. He defers. 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  Regular office visit in 6 months. Follow up sooner if needed.  Signed, [address] [place], [place], [facility] 12/23/2014 9:41 AM

## 2014-12-24 LAB — PSA, MEDICARE: PSA: 3.25 ng/mL (ref <=4.00)

## 2014-12-29 DIAGNOSIS — H9190 Unspecified hearing loss, unspecified ear: Secondary | ICD-10-CM | POA: Diagnosis not present

## 2014-12-29 DIAGNOSIS — H6121 Impacted cerumen, right ear: Secondary | ICD-10-CM | POA: Diagnosis not present

## 2015-01-13 ENCOUNTER — Telehealth: Payer: Self-pay | Admitting: Family Medicine

## 2015-01-13 NOTE — Telephone Encounter (Signed)
[phone] PT is needing to have referral to have MRI done of his back, I advised pt that he may have to come in to be seen before apt is scheduled but he said dr pickard is aware of his back pain

## 2015-01-13 NOTE — Telephone Encounter (Signed)
Pt states he is having chronic pain in his neck and back and would like to have MRI done ASAP, states that Dr. Dennard Schaumann had mentioned that if his symptoms continue then will do MRI. Please advise!

## 2015-01-15 DIAGNOSIS — M545 Low back pain: Secondary | ICD-10-CM | POA: Diagnosis not present

## 2015-01-21 DIAGNOSIS — M545 Low back pain: Secondary | ICD-10-CM | POA: Diagnosis not present

## 2015-01-21 DIAGNOSIS — M5440 Lumbago with sciatica, unspecified side: Secondary | ICD-10-CM | POA: Diagnosis not present

## 2015-01-26 DIAGNOSIS — M5441 Lumbago with sciatica, right side: Secondary | ICD-10-CM | POA: Diagnosis not present

## 2015-01-26 DIAGNOSIS — M4806 Spinal stenosis, lumbar region: Secondary | ICD-10-CM | POA: Diagnosis not present

## 2015-01-26 DIAGNOSIS — Z6829 Body mass index (BMI) 29.0-29.9, adult: Secondary | ICD-10-CM | POA: Diagnosis not present

## 2015-01-26 DIAGNOSIS — M4316 Spondylolisthesis, lumbar region: Secondary | ICD-10-CM | POA: Diagnosis not present

## 2015-02-18 ENCOUNTER — Other Ambulatory Visit: Payer: Self-pay | Admitting: Physician Assistant

## 2015-02-18 NOTE — Telephone Encounter (Signed)
Medication refilled per protocol. 

## 2015-08-14 ENCOUNTER — Encounter: Payer: Self-pay | Admitting: Family Medicine

## 2015-08-14 ENCOUNTER — Other Ambulatory Visit: Payer: Self-pay | Admitting: Physician Assistant

## 2015-08-14 NOTE — Telephone Encounter (Signed)
Medication refill for one time only.  Patient needs to be seen.  Letter sent for patient to call and schedule 

## 2015-09-09 ENCOUNTER — Encounter: Payer: Self-pay | Admitting: Physician Assistant

## 2015-09-09 ENCOUNTER — Other Ambulatory Visit: Payer: Self-pay | Admitting: Family Medicine

## 2015-09-09 ENCOUNTER — Ambulatory Visit (INDEPENDENT_AMBULATORY_CARE_PROVIDER_SITE_OTHER): Payer: Medicare Other | Admitting: Physician Assistant

## 2015-09-09 VITALS — BP 116/70 | HR 64 | Temp 98.3°F | Resp 18 | Ht 65.5 in | Wt 196.0 lb

## 2015-09-09 DIAGNOSIS — R739 Hyperglycemia, unspecified: Secondary | ICD-10-CM | POA: Diagnosis not present

## 2015-09-09 DIAGNOSIS — E781 Pure hyperglyceridemia: Secondary | ICD-10-CM

## 2015-09-09 DIAGNOSIS — Z125 Encounter for screening for malignant neoplasm of prostate: Secondary | ICD-10-CM | POA: Diagnosis not present

## 2015-09-09 DIAGNOSIS — R7309 Other abnormal glucose: Secondary | ICD-10-CM | POA: Diagnosis not present

## 2015-09-09 DIAGNOSIS — E785 Hyperlipidemia, unspecified: Secondary | ICD-10-CM

## 2015-09-09 DIAGNOSIS — I10 Essential (primary) hypertension: Secondary | ICD-10-CM

## 2015-09-09 LAB — LIPID PANEL
CHOL/HDL RATIO: 7.3 ratio — AB (ref <=5.0)
Cholesterol: 205 mg/dL — ABNORMAL HIGH (ref 125–200)
HDL: 28 mg/dL — ABNORMAL LOW (ref >=40)
LDL Cholesterol: 115 mg/dL (ref <130)
Triglycerides: 312 mg/dL — ABNORMAL HIGH (ref <150)
VLDL: 62 mg/dL — AB (ref <30)

## 2015-09-09 LAB — COMPLETE METABOLIC PANEL WITH GFR
ALBUMIN: 4.7 g/dL (ref 3.6–5.1)
ALK PHOS: 63 U/L (ref 40–115)
ALT: 44 U/L (ref 9–46)
AST: 27 U/L (ref 10–35)
BUN: 20 mg/dL (ref 7–25)
CO2: 27 mmol/L (ref 20–31)
Calcium: 10.5 mg/dL — ABNORMAL HIGH (ref 8.6–10.3)
Chloride: 101 mmol/L (ref 98–110)
Creat: 1.45 mg/dL — ABNORMAL HIGH (ref 0.70–1.25)
GFR, Est African American: 57 mL/min — ABNORMAL LOW (ref >=60)
GFR, Est Non African American: 49 mL/min — ABNORMAL LOW (ref >=60)
GLUCOSE: 94 mg/dL (ref 70–99)
POTASSIUM: 4.3 mmol/L (ref 3.5–5.3)
SODIUM: 140 mmol/L (ref 135–146)
TOTAL PROTEIN: 7.3 g/dL (ref 6.1–8.1)
Total Bilirubin: 0.5 mg/dL (ref 0.2–1.2)

## 2015-09-09 LAB — HEMOGLOBIN A1C
HEMOGLOBIN A1C: 6.2 % — AB (ref <5.7)
MEAN PLASMA GLUCOSE: 131 mg/dL — AB (ref <117)

## 2015-09-09 MED ORDER — AMLODIPINE BESYLATE 10 MG PO TABS: 10.0000 mg | ORAL_TABLET | Freq: Every day | ORAL | Status: DC

## 2015-09-09 MED ORDER — BENAZEPRIL HCL 20 MG PO TABS: 20.0000 mg | ORAL_TABLET | Freq: Every day | ORAL | Status: DC

## 2015-09-09 MED ORDER — METOPROLOL SUCCINATE ER 25 MG PO TB24: 25.0000 mg | ORAL_TABLET | Freq: Every day | ORAL | Status: DC

## 2015-09-09 MED ORDER — PRAVASTATIN SODIUM 40 MG PO TABS: 40.0000 mg | ORAL_TABLET | Freq: Every day | ORAL | Status: DC

## 2015-09-09 NOTE — Telephone Encounter (Signed)
Medication refilled per protocol. 

## 2015-09-09 NOTE — Progress Notes (Signed)
Patient ID: Mike Davila MRN: [id number], DOB: October 08, 1946, 69 y.o. Date of Encounter: @DATE @  Chief Complaint:  Chief Complaint  Patient presents with  . 6 month check up    is fasrting  . Medication Refill    HPI: 69 y.o. year old white male  presents for routine followup.  Hypertention: He is taking all of her blood pressure medications as directed. No adverse effects.  Hyperlipidemia: He is taking pravastatin as directed. No myalgias or other adverse effects. Still it a lot of fried foods. We have discussed this numerous times and "he just can't give that up"!  Hyperglycemia: This became a new diagnosis 09/2012. On those labs fasting glucose is 109. A1c was 6.1. At [place] 09/2012 he told me he was eating 2-3 fudge bars per day. After that, he pretty much stopped eating fudge bars. He had stopped eating candy in general. As well in the past he had been drinking 4-5 sodas per day. He changed this to drinking only water. Also in the past he was drinking a lot of orange juice. At [place] 09/2013 he said he has been drinking just one 8 ounce glasses each morning.   At f/u [place] he told me the same as in the past regarding eating very little bread/sandwiches. He eats some potatoes but not a lot. Eats minimal pasta. Says his wife is a very good cook and he really loves to eat. In past he said he was drinking quite a bit of milk but eats little ice cream.  At [place]--- 05/2014-- he says that he has stayed off of this sodas. Has stopped the orange juice. Now is drinking very little milk. Says that he basically drinks only water. Drinks water at breakfast lunch supper and throughout the day !! Says the only bread he eats is on sandwiches. Admits that he is does still enjoy his fried squash and has eaten this a lot over the summer. Says his wife works for the school system so she has been off for the summer it has been there to cook. She has just started back to work so she is not there to cook the big  lunches that they were having over the summer.   At [place] 09/09/15: Reviewed with him the result note attached to his last lab. At that time his A1c had increased slightly despite the fact that he had stopped the fudge bars the orange juice the sodas. Discussed that even if he is very compliant with his diet, that it is possible that the natural progression of the disease of diabetes will be that his pancreas continues to not put out proper amount of insulin, his body may not be sensitive to the insulin, and it may still be possible that we eventually have to add medication to control this. He voices understanding. He does state that he does occasionally drink a small Coke and that recently they have had candy left over from Halloween and he will sometimes take him 1 or 2 Reeces Pieces with him as he goes out the door. Also says that his wife has been making these little cakes that are really yummy.  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 Says that he has hired  help for that now. Says that he always just naturally wakes up at about 5 but stays in the bed until around 6.    Past Medical History  Diagnosis Date  . Hypertension   . Hyperglycemia   . Palpitations   . History of tobacco use   . Hernia     2  . Hematoma of leg     left leg  . Hyperlipidemia      Home Meds:  Outpatient Prescriptions Prior to Visit  Medication Sig Dispense Refill  . amLODipine (NORVASC) 10 MG tablet TAKE 1 TABLET BY MOUTH EVERY DAY 30 tablet 0  . benazepril (LOTENSIN) 20 MG tablet TAKE 1 TABLET BY MOUTH EVERY DAY 30 tablet 0  . ibuprofen (ADVIL,MOTRIN) 100 MG tablet Take 100 mg by mouth every 6 (six) hours as needed.      . metoprolol succinate (TOPROL-XL) 25 MG 24 hr tablet TAKE 1 TABLET BY MOUTH EVERY DAY 30 tablet 0  . pravastatin  (PRAVACHOL) 40 MG tablet Take 1 tablet (40 mg total) by mouth daily. 90 tablet 1  . sildenafil (VIAGRA) 100 MG tablet Take 100 mg by mouth daily as needed.       No facility-administered medications prior to visit.     Allergies:  Allergies  Allergen Reactions  . Crestor [Rosuvastatin Calcium]   . Lipitor [Atorvastatin Calcium]   . Sulfa Antibiotics   . Tetanus Toxoids     Social History   Social History  . Marital Status: Single    Spouse Name: N/A  . Number of Children: N/A  . Years of Education: N/A   Occupational History  . Not on file.   Social History Main Topics  . Smoking status: Former Research scientist (life sciences)  . Smokeless tobacco: Current User    Types: Chew  . Alcohol Use: No  . Drug Use: No  . Sexual Activity: Yes   Other Topics Concern  . Not on file   Social History Narrative   Married.   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    Family History  Problem Relation Age of Onset  . Heart disease Father     MI     Review of Systems:  See HPI for pertinent ROS. All other ROS negative.    Physical Exam: Blood pressure 116/70, pulse 64, temperature 98.3 F (36.8 C), temperature source Oral, resp. rate 18, height 5' 5.5" (1.664 m), weight 196 lb (88.905 kg)., Body mass index is 32.11 kg/(m^2). General: WNWD WM. Very Pleasant. Appears in no acute distress. Neck: Supple. No thyromegaly. No lymphadenopathy. No carotid bruits. Lungs: Clear bilaterally to auscultation without wheezes, rales, or rhonchi. Breathing is unlabored. Heart: RRR with S1 S2. No murmurs, rubs, or gallops. Abdomen: Soft, non-tender, non-distended with normoactive bowel sounds. No hepatomegaly. No rebound/guarding. No obvious abdominal masses. Musculoskeletal: Strength and tone normal for age.  Extremities/Skin: Warm and dry. No edema.  Neuro: Alert and oriented X 3. Moves all extremities spontaneously. Gait is normal. CNII-XII grossly in tact. Psych:  Responds to questions appropriately with a normal affect.      ASSESSMENT AND PLAN:  69 y.o. year old male with   1. Hyperlipidemia He is on pravastatin. Will check labs to monitor. - COMPLETE METABOLIC PANEL WITH GFR - Lipid panel  2. Hypertriglyceridemia He is on pravastatin. Will check labs to monitor. - COMPLETE METABOLIC PANEL WITH GFR - Lipid panel  3. Hyperglycemia See history of present illness for details. So far this has been controlled with diet. If A1c  increases significantly this time will have him come back in 3 months rather than waiting 6 months between visits. - COMPLETE METABOLIC PANEL WITH GFR - Hemoglobin A1c  4. Essential hypertension Blood pressure is at goal. Check labs to monitor. Continue current medications. - COMPLETE METABOLIC PANEL WITH GFR   5. Prostate cancer screening Last PSA was 12/23/2014---. Normal. Check annually. Patient does want to make sure this is done annually   7. Colorectal cancer screening: Patient has refused colonoscopy. Still refuses. Aware of risks/benefits. As well we have discussed Hemoccults. He refuses to do these. 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 He denies any melena or hematochezia.  8. CKD: Monitor. Blood sugar and hypertension and pain control. On ACE inhibitor.  9. ED: Discussed Toprol may be affecting this. He does not want to stop Toprol because has palpitations if he does not take Toprol.  10. Past smoker: Quit 2007. He says he has no problems with shortness of breath or wheezing ever since he stopped smoking. Does not need to start medications for COPD.  11. Immunizations:  Discussed pneumonia vaccines. He defers. 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  Regular office visit in 6 months. Follow up sooner if needed.   Marin Olp [place], [place], [facility] 09/09/2015 8:54 AM

## 2015-09-11 ENCOUNTER — Other Ambulatory Visit: Payer: Self-pay | Admitting: *Deleted

## 2015-09-11 MED ORDER — PRAVASTATIN SODIUM 80 MG PO TABS: 80.0000 mg | ORAL_TABLET | Freq: Every day | ORAL | Status: DC

## 2015-09-14 ENCOUNTER — Other Ambulatory Visit: Payer: Self-pay | Admitting: Physician Assistant

## 2015-09-14 NOTE — Telephone Encounter (Signed)
Medication refilled per protocol. 

## 2016-03-14 ENCOUNTER — Other Ambulatory Visit: Payer: Self-pay | Admitting: Physician Assistant

## 2016-03-14 NOTE — Telephone Encounter (Signed)
Refill appropriate and filled per protocol. 

## 2016-06-15 ENCOUNTER — Encounter: Payer: Self-pay | Admitting: Physician Assistant

## 2016-06-15 ENCOUNTER — Ambulatory Visit (INDEPENDENT_AMBULATORY_CARE_PROVIDER_SITE_OTHER): Payer: Medicare Other | Admitting: Physician Assistant

## 2016-06-15 VITALS — BP 116/78 | HR 67 | Temp 98.1°F | Wt 192.0 lb

## 2016-06-15 DIAGNOSIS — I10 Essential (primary) hypertension: Secondary | ICD-10-CM

## 2016-06-15 DIAGNOSIS — E781 Pure hyperglyceridemia: Secondary | ICD-10-CM | POA: Diagnosis not present

## 2016-06-15 DIAGNOSIS — E785 Hyperlipidemia, unspecified: Secondary | ICD-10-CM

## 2016-06-15 DIAGNOSIS — R739 Hyperglycemia, unspecified: Secondary | ICD-10-CM | POA: Diagnosis not present

## 2016-06-15 DIAGNOSIS — E559 Vitamin D deficiency, unspecified: Secondary | ICD-10-CM | POA: Diagnosis not present

## 2016-06-15 DIAGNOSIS — R5383 Other fatigue: Secondary | ICD-10-CM

## 2016-06-15 DIAGNOSIS — Z125 Encounter for screening for malignant neoplasm of prostate: Secondary | ICD-10-CM

## 2016-06-15 LAB — CBC WITH DIFFERENTIAL/PLATELET
BASOS PCT: 1 %
Basophils Absolute: 78 cells/uL (ref 0–200)
EOS PCT: 2 %
Eosinophils Absolute: 156 cells/uL (ref 15–500)
HCT: 41.4 % (ref 38.5–50.0)
Hemoglobin: 13.8 g/dL (ref 13.0–17.0)
LYMPHS PCT: 25 %
Lymphs Abs: 1950 cells/uL (ref 850–3900)
MCH: 29.4 pg (ref 27.0–33.0)
MCHC: 33.3 g/dL (ref 32.0–36.0)
MCV: 88.1 fL (ref 80.0–100.0)
MONOS PCT: 6 %
MPV: 10.4 fL (ref 7.5–12.5)
Monocytes Absolute: 468 cells/uL (ref 200–950)
NEUTROS ABS: 5148 {cells}/uL (ref 1500–7800)
Neutrophils Relative %: 66 %
PLATELETS: 246 10*3/uL (ref 140–400)
RBC: 4.7 MIL/uL (ref 4.20–5.80)
RDW: 14.8 % (ref 11.0–15.0)
WBC: 7.8 10*3/uL (ref 3.8–10.8)

## 2016-06-15 LAB — LIPID PANEL
Cholesterol: 225 mg/dL — ABNORMAL HIGH (ref 125–200)
HDL: 40 mg/dL (ref >=40)
LDL CALC: 114 mg/dL (ref <130)
TRIGLYCERIDES: 353 mg/dL — AB (ref <150)
Total CHOL/HDL Ratio: 5.6 Ratio — ABNORMAL HIGH (ref <=5.0)
VLDL: 71 mg/dL — AB (ref <30)

## 2016-06-15 LAB — COMPLETE METABOLIC PANEL WITH GFR
ALT: 32 U/L (ref 9–46)
AST: 20 U/L (ref 10–35)
Albumin: 4.5 g/dL (ref 3.6–5.1)
Alkaline Phosphatase: 61 U/L (ref 40–115)
BILIRUBIN TOTAL: 0.5 mg/dL (ref 0.2–1.2)
BUN: 28 mg/dL — ABNORMAL HIGH (ref 7–25)
CHLORIDE: 104 mmol/L (ref 98–110)
CO2: 24 mmol/L (ref 20–31)
CREATININE: 1.7 mg/dL — AB (ref 0.70–1.25)
Calcium: 11.1 mg/dL — ABNORMAL HIGH (ref 8.6–10.3)
GFR, EST NON AFRICAN AMERICAN: 41 mL/min — AB (ref >=60)
GFR, Est African American: 47 mL/min — ABNORMAL LOW (ref >=60)
GLUCOSE: 80 mg/dL (ref 70–99)
Potassium: 4.2 mmol/L (ref 3.5–5.3)
SODIUM: 142 mmol/L (ref 135–146)
TOTAL PROTEIN: 7.2 g/dL (ref 6.1–8.1)

## 2016-06-15 LAB — TSH: TSH: 3.28 mIU/L (ref 0.40–4.50)

## 2016-06-15 MED ORDER — SILDENAFIL CITRATE 100 MG PO TABS: 100.0000 mg | ORAL_TABLET | Freq: Every day | ORAL | 11 refills | Status: DC | PRN

## 2016-06-15 NOTE — Progress Notes (Signed)
Patient ID: UZIAH ORENDER MRN: [id number], DOB: 12/12/1945, 70 y.o. Date of Encounter: @DATE @  Chief Complaint:  Chief Complaint  Patient presents with  . Follow-up    HPI: 70 y.o. year old white male  presents for routine followup.  Hypertention: He is taking all of his blood pressure medications as directed. No adverse effects.  Hyperlipidemia: He is taking pravastatin as directed. No myalgias or other adverse effects. Still it a lot of fried foods. We have discussed this numerous times and "he just can't give that up"!  Hyperglycemia: This became a new diagnosis 09/2012. On those labs fasting glucose is 109. A1c was 6.1. At [place] 09/2012 he told me he was eating 2-3 fudge bars per day. After that, he pretty much stopped eating fudge bars. He had stopped eating candy in general. As well in the past he had been drinking 4-5 sodas per day. He changed this to drinking only water. Also in the past he was drinking a lot of orange juice. At [place] 09/2013 he said he has been drinking just one 8 ounce glasses each morning.   At f/u OV he told me the same as in the past regarding eating very little bread/sandwiches. He eats some potatoes but not a lot. Eats minimal pasta. Says his wife is a very good cook and he really loves to eat. In past he said he was drinking quite a bit of milk but eats little ice cream.  At [place]--- 05/2014-- he says that he has stayed off of this sodas. Has stopped the orange juice. Now is drinking very little milk. Says that he basically drinks only water. Drinks water at breakfast lunch supper and throughout the day !! Says the only bread he eats is on sandwiches. Admits that he is does still enjoy his fried squash and has eaten this a lot over the summer. Says his wife works for the school system so she has been off for the summer it has been there to cook. She has just started back to work so she is not there to cook the big lunches that they were having over the  summer.   At [place] 09/09/15: Reviewed with him the result note attached to his last lab. At that time his A1c had increased slightly despite the fact that he had stopped the fudge bars the orange juice the sodas. Discussed that even if he is very compliant with his diet, that it is possible that the natural progression of the disease of diabetes will be that his pancreas continues to not put out proper amount of insulin, his body may not be sensitive to the insulin, and it may still be possible that we eventually have to add medication to control this. He voices understanding. He does state that he does occasionally drink a small Coke and that recently they have had candy left over from Halloween and he will sometimes take him 1 or 2 Reeces Pieces with him as he goes out the door. Also says that his wife has been making these little cakes that are really yummy.  At [place] 05/2016--- he reports that he thinks he has been doing pretty well with being compliant with diet. says that he doesn't feel like he has been eating as much of his wife's cooking this summer as sometimes in the past. Says that with the heat and the hard work he just hasn't felt as hungry. Says the heat and his age are catching up with him!!  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 Says that he always just naturally wakes up at about 5 but stays in the bed until around 6.    Past Medical History:  Diagnosis Date  . Hematoma of leg    left leg  . Hernia    2  . History of tobacco use   . Hyperglycemia   . Hyperlipidemia   . Hypertension   . Palpitations      Home Meds:  Outpatient Medications Prior to Visit  Medication Sig Dispense Refill  . amLODipine (NORVASC) 10 MG tablet TAKE ONE TABLET BY MOUTH DAILY. 90  tablet 0  . benazepril (LOTENSIN) 20 MG tablet TAKE ONE TABLET BY MOUTH DAILY. 90 tablet 0  . ibuprofen (ADVIL,MOTRIN) 100 MG tablet Take 100 mg by mouth every 6 (six) hours as needed.      . metoprolol succinate (TOPROL-XL) 25 MG 24 hr tablet TAKE ONE TABLET BY MOUTH DAILY. 90 tablet 0  . pravastatin (PRAVACHOL) 80 MG tablet Take 1 tablet (80 mg total) by mouth daily. 90 tablet 1  . sildenafil (VIAGRA) 100 MG tablet Take 100 mg by mouth daily as needed.       No facility-administered medications prior to visit.      Allergies:  Allergies  Allergen Reactions  . Crestor [Rosuvastatin Calcium]   . Lipitor [Atorvastatin Calcium]   . Sulfa Antibiotics   . Tetanus Toxoids     Social History   Social History  . Marital status: Single    Spouse name: N/A  . Number of children: N/A  . Years of education: N/A   Occupational History  . Not on file.   Social History Main Topics  . Smoking status: Former Research scientist (life sciences)  . Smokeless tobacco: Current User    Types: Chew  . Alcohol use No  . Drug use: No  . Sexual activity: Yes   Other Topics Concern  . Not on file   Social History Narrative   Married.   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    Family History  Problem Relation Age of Onset  . Heart disease Father     MI     Review of Systems:  See HPI for pertinent ROS. All other ROS negative.    Physical Exam: Blood pressure 116/78, pulse 67, temperature 98.1 F (36.7 C), weight 192 lb (87.1 kg), SpO2 97 %., Body mass index is 31.46 kg/m. General: WNWD WM. Very Pleasant. Appears in no acute distress. Neck: Supple. No thyromegaly. No lymphadenopathy. No carotid bruits. Lungs: Clear bilaterally to auscultation without wheezes, rales, or rhonchi. Breathing is unlabored. Heart: RRR with S1 S2. No murmurs, rubs, or gallops. Abdomen: Soft, non-tender, non-distended with normoactive bowel sounds. No hepatomegaly. No rebound/guarding. No obvious abdominal masses. Musculoskeletal: Strength and tone normal  for age.  Extremities/Skin: Warm and dry. No edema.  Neuro: Alert and oriented X 3. Moves all extremities spontaneously. Gait is normal. CNII-XII grossly in tact. Psych:  Responds to questions appropriately with a normal affect.     ASSESSMENT AND PLAN:  70 y.o. year old male with   1. Hyperlipidemia He is on pravastatin. Will check labs to monitor. - COMPLETE METABOLIC PANEL WITH GFR -  Lipid panel  2. Hypertriglyceridemia He is on pravastatin. Will check labs to monitor. - COMPLETE METABOLIC PANEL WITH GFR - Lipid panel  3. Hyperglycemia See history of present illness for details. So far this has been controlled with diet. If A1c increases significantly this time will have him come back in 3 months rather than waiting 6 months between visits. - COMPLETE METABOLIC PANEL WITH GFR - Hemoglobin A1c  4. Essential hypertension Blood pressure is at goal. Check labs to monitor. Continue current medications. - COMPLETE METABOLIC PANEL WITH GFR   5. Prostate cancer screening Last PSA was 12/23/2014---. Normal. Rechecked 06/15/2016-- Check annually. Patient does want to make sure this is done annually   7. Colorectal cancer screening: Patient has refused colonoscopy. Still refuses. Aware of risks/benefits. As well we have discussed Hemoccults. He refuses to do these. 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 He denies any melena or hematochezia.  8. CKD: Monitor. Blood sugar and hypertension and pain control. On ACE inhibitor.  9. ED: Discussed Toprol may be affecting this. He does not want to stop Toprol because has palpitations if he does not take Toprol. Uses Viagra.  10. Past smoker: Quit 2007. He says he has no problems with shortness of breath or wheezing ever since he stopped smoking. Does not need to start medications for COPD.  11. Immunizations:  Discussed pneumonia vaccines. He defers.  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  Regular office visit in 6 months. Follow up sooner if needed.   [address] [place], [place], [facility] 06/15/2016 10:25 AM

## 2016-06-16 LAB — HEMOGLOBIN A1C
HEMOGLOBIN A1C: 5.9 % — AB (ref <5.7)
Mean Plasma Glucose: 123 mg/dL

## 2016-06-16 LAB — VITAMIN D 25 HYDROXY (VIT D DEFICIENCY, FRACTURES): VIT D 25 HYDROXY: 37 ng/mL (ref 30–100)

## 2016-06-16 LAB — PSA: PSA: 1.7 ng/mL (ref <=4.0)

## 2016-06-17 ENCOUNTER — Other Ambulatory Visit: Payer: Self-pay | Admitting: Physician Assistant

## 2016-06-17 NOTE — Telephone Encounter (Signed)
Refill appropriate and filled per protocol. 

## 2016-09-20 ENCOUNTER — Other Ambulatory Visit: Payer: Self-pay | Admitting: Family Medicine

## 2016-09-21 NOTE — Telephone Encounter (Signed)
Rx filled per protocol  

## 2016-11-28 ENCOUNTER — Encounter: Payer: Self-pay | Admitting: Physician Assistant

## 2016-11-28 ENCOUNTER — Ambulatory Visit (INDEPENDENT_AMBULATORY_CARE_PROVIDER_SITE_OTHER): Payer: Medicare Other | Admitting: Physician Assistant

## 2016-11-28 VITALS — BP 130/82 | HR 71 | Temp 97.6°F | Resp 18 | Wt 202.8 lb

## 2016-11-28 DIAGNOSIS — J0110 Acute frontal sinusitis, unspecified: Secondary | ICD-10-CM | POA: Diagnosis not present

## 2016-11-28 MED ORDER — CETIRIZINE HCL 10 MG PO TABS: 10.0000 mg | ORAL_TABLET | Freq: Every day | ORAL | 11 refills | Status: DC

## 2016-11-28 MED ORDER — PREDNISONE 20 MG PO TABS: ORAL_TABLET | ORAL | 0 refills | Status: DC

## 2016-11-28 NOTE — Progress Notes (Signed)
Patient ID: Mike Davila MRN: [id number], DOB: 03/29/1946, 71 y.o. Date of Encounter: 11/28/2016, 10:20 AM    Chief Complaint:  Chief Complaint  Patient presents with  . Hypertension    when bending over severe head pain on right side of head,when laying down pressure on right side of head for abour 30sec     HPI: 71 y.o. year old male presents with above.   She states that he started checking his blood pressure at home because in the past the only time he's had any type of headache was--- he had some headache, came here, and that's when we found out that he had high blood pressure. Since these symptoms started, he started checking his blood pressure. However, he thinks that his blood pressure machine is not accurate. Says that he put in new batteries but it still was reading "Error" but then at times would give a blood pressure reading but it was high.  Wanted to come here and have [place] recheck blood pressure as he was concerned high blood pressure may be the cause of this headache. Now that we are getting good blood pressure reading here he is sure that just his cuff is not working.  Says that he started noticing the symptoms Friday afternoon 11/25/16. Any time he bends down and has his head down low, he feels pressure all around and behind the right eye. Right fore head and in the right upper cheek region. Has had no vision changes. Has developed no additional new symptoms on Friday or since then. Says that every morning he blows his nose 3 or 4 times and throughout the day will have some runny nose that he just wipes with a handkerchief. Says that this is chronic. Says that this is all clear drainage. He does note that when he blows his nose it all comes out of the right side. Has had no fevers or chills. No thick dark mucus. No other associated symptoms.     Home Meds:   Outpatient Medications Prior to Visit  Medication Sig Dispense Refill  . amLODipine (NORVASC) 10 MG tablet TAKE  ONE TABLET BY MOUTH DAILY. 90 tablet 0  . aspirin (CHILDRENS ASPIRIN) 81 MG chewable tablet Chew 81 mg by mouth daily.    . benazepril (LOTENSIN) 20 MG tablet TAKE ONE TABLET BY MOUTH DAILY. 90 tablet 0  . ibuprofen (ADVIL,MOTRIN) 100 MG tablet Take 100 mg by mouth every 6 (six) hours as needed.      . metoprolol succinate (TOPROL-XL) 25 MG 24 hr tablet TAKE ONE TABLET BY MOUTH DAILY. 90 tablet 0  . pravastatin (PRAVACHOL) 80 MG tablet Take 1 tablet (80 mg total) by mouth daily. 90 tablet 1  . sildenafil (VIAGRA) 100 MG tablet Take 1 tablet (100 mg total) by mouth daily as needed. 10 tablet 11   No facility-administered medications prior to visit.     Allergies:  Allergies  Allergen Reactions  . Crestor [Rosuvastatin Calcium]   . Lipitor [Atorvastatin Calcium]   . Sulfa Antibiotics   . Tetanus Toxoids       Review of Systems: See HPI for pertinent ROS. All other ROS negative.    Physical Exam: Blood pressure 130/82, pulse 71, temperature 97.6 F (36.4 C), temperature source Oral, resp. rate 18, weight 202 lb 12.8 oz (92 kg), SpO2 96 %., Body mass index is 33.23 kg/m. General: WNWD WM.  Appears in no acute distress. HEENT: Normocephalic, atraumatic, eyes without discharge, sclera non-icteric, nares  are without discharge. Bilateral auditory canals clear, TM's are without perforation, pearly grey and translucent with reflective cone of light bilaterally. Oral cavity moist, posterior pharynx without exudate, erythema, peritonsillar abscess, or post nasal drip. No rash on face.  No tenderness with percussion at site of temporal artery. Mild tenderness with percussion to right maxillary and right frontal sinus.  No tenderness with percussion to other sinus regions. Neck: Supple. No thyromegaly. No lymphadenopathy. No carotid bruit. Lungs: Clear bilaterally to auscultation without wheezes, rales, or rhonchi. Breathing is unlabored. Heart: Regular rhythm. No murmurs, rubs, or  gallops. Msk:  Strength and tone normal for age. Extremities/Skin: Warm and dry. No rashes.  Neuro: Alert and oriented X 3. Moves all extremities spontaneously. Gait is normal. CNII-XII grossly in tact. Psych:  Responds to questions appropriately with a normal affect.     ASSESSMENT AND PLAN:  71 y.o. year old male with  1. Acute non-recurrent frontal sinusitis If Zyrtec causes drowsiness, take this at nighttime. Take Zyrtec on a daily basis. Take Prednisone taper as directed. Follow up if develops any additional new symptoms. F/U if symptoms do not resolve upon completion of the prednisone taper. - predniSONE (DELTASONE) 20 MG tablet; Take 3 daily for 2 days, then 2 daily for 2 days, then 1 daily for 2 days.  Dispense: 12 tablet; Refill: 0 - cetirizine (ZYRTEC) 10 MG tablet; Take 1 tablet (10 mg total) by mouth daily.  Dispense: 30 tablet; Refill: [address] [place], [place], [facility] 11/28/2016 10:20 AM

## 2016-12-22 ENCOUNTER — Other Ambulatory Visit: Payer: Self-pay | Admitting: Family Medicine

## 2017-03-21 ENCOUNTER — Other Ambulatory Visit: Payer: Self-pay | Admitting: Family Medicine

## 2017-06-17 ENCOUNTER — Other Ambulatory Visit: Payer: Self-pay | Admitting: Physician Assistant

## 2017-06-19 NOTE — Telephone Encounter (Signed)
Refill appropriate 

## 2017-09-13 ENCOUNTER — Other Ambulatory Visit: Payer: Self-pay | Admitting: Physician Assistant

## 2017-10-05 DIAGNOSIS — Z8719 Personal history of other diseases of the digestive system: Secondary | ICD-10-CM

## 2017-10-05 HISTORY — DX: Personal history of other diseases of the digestive system: Z87.19

## 2017-10-23 ENCOUNTER — Other Ambulatory Visit: Payer: Self-pay | Admitting: Physician Assistant

## 2017-10-23 ENCOUNTER — Ambulatory Visit (HOSPITAL_COMMUNITY)
Admission: RE | Admit: 2017-10-23 | Discharge: 2017-10-23 | Disposition: A | Discharge status: Home / Self Care | Payer: Medicare Other | Source: Ambulatory Visit | Attending: Physician Assistant | Admitting: Physician Assistant

## 2017-10-23 ENCOUNTER — Encounter (HOSPITAL_COMMUNITY): Payer: Self-pay | Admitting: Emergency Medicine

## 2017-10-23 ENCOUNTER — Ambulatory Visit (INDEPENDENT_AMBULATORY_CARE_PROVIDER_SITE_OTHER): Payer: Medicare Other | Admitting: Physician Assistant

## 2017-10-23 ENCOUNTER — Inpatient Hospital Stay (HOSPITAL_COMMUNITY)
Admission: EM | Admit: 2017-10-23 | Discharge: 2017-10-28 | DRG: 682 | Disposition: A | Discharge status: Home / Self Care | Payer: Medicare Other | Attending: Internal Medicine | Admitting: Internal Medicine

## 2017-10-23 ENCOUNTER — Encounter: Payer: Self-pay | Admitting: Physician Assistant

## 2017-10-23 ENCOUNTER — Other Ambulatory Visit: Payer: Self-pay

## 2017-10-23 VITALS — BP 128/84 | HR 104 | Temp 97.6°F | Resp 18 | Wt 176.4 lb

## 2017-10-23 DIAGNOSIS — N400 Enlarged prostate without lower urinary tract symptoms: Secondary | ICD-10-CM | POA: Diagnosis present

## 2017-10-23 DIAGNOSIS — M10061 Idiopathic gout, right knee: Secondary | ICD-10-CM | POA: Diagnosis present

## 2017-10-23 DIAGNOSIS — R112 Nausea with vomiting, unspecified: Secondary | ICD-10-CM

## 2017-10-23 DIAGNOSIS — K5732 Diverticulitis of large intestine without perforation or abscess without bleeding: Secondary | ICD-10-CM | POA: Diagnosis present

## 2017-10-23 DIAGNOSIS — R739 Hyperglycemia, unspecified: Secondary | ICD-10-CM | POA: Diagnosis present

## 2017-10-23 DIAGNOSIS — R197 Diarrhea, unspecified: Secondary | ICD-10-CM

## 2017-10-23 DIAGNOSIS — T39395A Adverse effect of other nonsteroidal anti-inflammatory drugs [NSAID], initial encounter: Secondary | ICD-10-CM | POA: Diagnosis present

## 2017-10-23 DIAGNOSIS — E781 Pure hyperglyceridemia: Secondary | ICD-10-CM | POA: Diagnosis present

## 2017-10-23 DIAGNOSIS — R531 Weakness: Secondary | ICD-10-CM

## 2017-10-23 DIAGNOSIS — E785 Hyperlipidemia, unspecified: Secondary | ICD-10-CM | POA: Diagnosis present

## 2017-10-23 DIAGNOSIS — K5730 Diverticulosis of large intestine without perforation or abscess without bleeding: Secondary | ICD-10-CM | POA: Diagnosis present

## 2017-10-23 DIAGNOSIS — K909 Intestinal malabsorption, unspecified: Secondary | ICD-10-CM | POA: Diagnosis not present

## 2017-10-23 DIAGNOSIS — I10 Essential (primary) hypertension: Secondary | ICD-10-CM | POA: Diagnosis not present

## 2017-10-23 DIAGNOSIS — R634 Abnormal weight loss: Secondary | ICD-10-CM | POA: Diagnosis present

## 2017-10-23 DIAGNOSIS — R7303 Prediabetes: Secondary | ICD-10-CM | POA: Diagnosis present

## 2017-10-23 DIAGNOSIS — Z7982 Long term (current) use of aspirin: Secondary | ICD-10-CM | POA: Diagnosis not present

## 2017-10-23 DIAGNOSIS — N202 Calculus of kidney with calculus of ureter: Secondary | ICD-10-CM | POA: Diagnosis not present

## 2017-10-23 DIAGNOSIS — K648 Other hemorrhoids: Secondary | ICD-10-CM | POA: Diagnosis present

## 2017-10-23 DIAGNOSIS — E875 Hyperkalemia: Secondary | ICD-10-CM | POA: Diagnosis not present

## 2017-10-23 DIAGNOSIS — E43 Unspecified severe protein-calorie malnutrition: Secondary | ICD-10-CM | POA: Diagnosis present

## 2017-10-23 DIAGNOSIS — E860 Dehydration: Secondary | ICD-10-CM | POA: Diagnosis present

## 2017-10-23 DIAGNOSIS — K219 Gastro-esophageal reflux disease without esophagitis: Secondary | ICD-10-CM | POA: Diagnosis present

## 2017-10-23 DIAGNOSIS — Z8639 Personal history of other endocrine, nutritional and metabolic disease: Secondary | ICD-10-CM

## 2017-10-23 DIAGNOSIS — I129 Hypertensive chronic kidney disease with stage 1 through stage 4 chronic kidney disease, or unspecified chronic kidney disease: Secondary | ICD-10-CM | POA: Diagnosis present

## 2017-10-23 DIAGNOSIS — R9431 Abnormal electrocardiogram [ECG] [EKG]: Secondary | ICD-10-CM | POA: Diagnosis not present

## 2017-10-23 DIAGNOSIS — Z8739 Personal history of other diseases of the musculoskeletal system and connective tissue: Secondary | ICD-10-CM

## 2017-10-23 DIAGNOSIS — Z87891 Personal history of nicotine dependence: Secondary | ICD-10-CM | POA: Diagnosis not present

## 2017-10-23 DIAGNOSIS — E872 Acidosis, unspecified: Secondary | ICD-10-CM | POA: Diagnosis present

## 2017-10-23 DIAGNOSIS — N170 Acute kidney failure with tubular necrosis: Principal | ICD-10-CM | POA: Diagnosis present

## 2017-10-23 DIAGNOSIS — R109 Unspecified abdominal pain: Secondary | ICD-10-CM | POA: Diagnosis not present

## 2017-10-23 DIAGNOSIS — D649 Anemia, unspecified: Secondary | ICD-10-CM | POA: Diagnosis present

## 2017-10-23 DIAGNOSIS — Z6827 Body mass index (BMI) 27.0-27.9, adult: Secondary | ICD-10-CM

## 2017-10-23 DIAGNOSIS — N183 Chronic kidney disease, stage 3 (moderate): Secondary | ICD-10-CM | POA: Diagnosis present

## 2017-10-23 DIAGNOSIS — N184 Chronic kidney disease, stage 4 (severe): Secondary | ICD-10-CM | POA: Diagnosis present

## 2017-10-23 DIAGNOSIS — N179 Acute kidney failure, unspecified: Secondary | ICD-10-CM | POA: Diagnosis not present

## 2017-10-23 DIAGNOSIS — M109 Gout, unspecified: Secondary | ICD-10-CM | POA: Diagnosis not present

## 2017-10-23 DIAGNOSIS — Z87448 Personal history of other diseases of urinary system: Secondary | ICD-10-CM

## 2017-10-23 HISTORY — DX: Personal history of other endocrine, nutritional and metabolic disease: Z86.39

## 2017-10-23 HISTORY — DX: Personal history of other diseases of the musculoskeletal system and connective tissue: Z87.39

## 2017-10-23 HISTORY — DX: Personal history of other diseases of urinary system: Z87.448

## 2017-10-23 LAB — URINALYSIS, ROUTINE W REFLEX MICROSCOPIC
BILIRUBIN URINE: NEGATIVE
Bacteria, UA: NONE SEEN
GLUCOSE, UA: NEGATIVE mg/dL
Ketones, ur: NEGATIVE mg/dL
NITRITE: NEGATIVE
PH: 5 (ref 5.0–8.0)
Protein, ur: NEGATIVE mg/dL
SPECIFIC GRAVITY, URINE: 1.011 (ref 1.005–1.030)

## 2017-10-23 LAB — COMPREHENSIVE METABOLIC PANEL
ALT: 12 U/L — AB (ref 17–63)
AST: 9 U/L — ABNORMAL LOW (ref 15–41)
Albumin: 3.7 g/dL (ref 3.5–5.0)
Alkaline Phosphatase: 64 U/L (ref 38–126)
Anion gap: 11 (ref 5–15)
BUN: 97 mg/dL — ABNORMAL HIGH (ref 6–20)
CHLORIDE: 109 mmol/L (ref 101–111)
CO2: 17 mmol/L — ABNORMAL LOW (ref 22–32)
CREATININE: 4.75 mg/dL — AB (ref 0.61–1.24)
Calcium: 11.6 mg/dL — ABNORMAL HIGH (ref 8.9–10.3)
GFR, EST AFRICAN AMERICAN: 13 mL/min — AB (ref >60)
GFR, EST NON AFRICAN AMERICAN: 11 mL/min — AB (ref >60)
Glucose, Bld: 116 mg/dL — ABNORMAL HIGH (ref 65–99)
Potassium: 5.4 mmol/L — ABNORMAL HIGH (ref 3.5–5.1)
Sodium: 137 mmol/L (ref 135–145)
Total Bilirubin: 0.6 mg/dL (ref 0.3–1.2)
Total Protein: 7 g/dL (ref 6.5–8.1)

## 2017-10-23 LAB — CBC WITH DIFFERENTIAL/PLATELET
Basophils Absolute: 0 10*3/uL (ref 0.0–0.1)
Basophils Relative: 0 %
EOS PCT: 2 %
Eosinophils Absolute: 0.2 10*3/uL (ref 0.0–0.7)
HCT: 31.1 % — ABNORMAL LOW (ref 39.0–52.0)
Hemoglobin: 10.5 g/dL — ABNORMAL LOW (ref 13.0–17.0)
LYMPHS ABS: 1.3 10*3/uL (ref 0.7–4.0)
LYMPHS PCT: 14 %
MCH: 28.8 pg (ref 26.0–34.0)
MCHC: 33.8 g/dL (ref 30.0–36.0)
MCV: 85.2 fL (ref 78.0–100.0)
MONO ABS: 0.7 10*3/uL (ref 0.1–1.0)
Monocytes Relative: 7 %
Neutro Abs: 6.9 10*3/uL (ref 1.7–7.7)
Neutrophils Relative %: 77 %
PLATELETS: 236 10*3/uL (ref 150–400)
RBC: 3.65 MIL/uL — AB (ref 4.22–5.81)
RDW: 14.3 % (ref 11.5–15.5)
WBC: 9 10*3/uL (ref 4.0–10.5)

## 2017-10-23 LAB — CK: Total CK: 29 U/L — ABNORMAL LOW (ref 49–397)

## 2017-10-23 LAB — POCT I-STAT CREATININE: CREATININE: 5.7 mg/dL — AB (ref 0.61–1.24)

## 2017-10-23 LAB — CBC
HEMATOCRIT: 34.7 % — AB (ref 38.5–50.0)
Hemoglobin: 12 g/dL — ABNORMAL LOW (ref 13.2–17.1)
MCH: 29.1 pg (ref 27.0–33.0)
MCHC: 34.6 g/dL (ref 32.0–36.0)
MCV: 84 fL (ref 80.0–100.0)
Platelets: 290 10*3/uL (ref 140–400)
RBC: 4.13 10*6/uL — ABNORMAL LOW (ref 4.20–5.80)
RDW: 14.9 % (ref 11.0–15.0)
WBC: 9.8 10*3/uL (ref 3.8–10.8)

## 2017-10-23 LAB — PHOSPHORUS: Phosphorus: 5.2 mg/dL — ABNORMAL HIGH (ref 2.5–4.6)

## 2017-10-23 MED ORDER — IOPAMIDOL (ISOVUE-300) INJECTION 61%: 100.0000 mL | Freq: Once | INTRAVENOUS | Status: DC | PRN

## 2017-10-23 MED ORDER — PROMETHAZINE HCL 25 MG PO TABS: 25.0000 mg | ORAL_TABLET | Freq: Four times a day (QID) | ORAL | 0 refills | Status: DC | PRN

## 2017-10-23 MED ORDER — ORAL CARE MOUTH RINSE
15.0000 mL | Freq: Two times a day (BID) | OROMUCOSAL | Status: DC
  Administered 2017-10-24 – 2017-10-25 (×2): 15 mL via OROMUCOSAL

## 2017-10-23 MED ORDER — ONDANSETRON HCL 4 MG/2ML IJ SOLN
4.0000 mg | Freq: Four times a day (QID) | INTRAMUSCULAR | Status: DC | PRN
  Administered 2017-10-28: 4 mg via INTRAVENOUS
  Filled 2017-10-23: qty 2

## 2017-10-23 MED ORDER — SODIUM CHLORIDE 0.9 % IV SOLN
INTRAVENOUS | Status: DC
Start: 2017-10-23 — End: 2017-10-28
  Administered 2017-10-23 – 2017-10-28 (×11): via INTRAVENOUS

## 2017-10-23 MED ORDER — ACETAMINOPHEN 650 MG RE SUPP: 650.0000 mg | Freq: Four times a day (QID) | RECTAL | Status: DC | PRN

## 2017-10-23 MED ORDER — CIPROFLOXACIN IN D5W 400 MG/200ML IV SOLN
400.0000 mg | Freq: Once | INTRAVENOUS | Status: AC
  Administered 2017-10-23: 400 mg via INTRAVENOUS
  Filled 2017-10-23: qty 200

## 2017-10-23 MED ORDER — SODIUM CHLORIDE 0.9 % IV SOLN
INTRAVENOUS | Status: DC
  Administered 2017-10-23: 19:00:00 via INTRAVENOUS

## 2017-10-23 MED ORDER — MAGNESIUM SULFATE 2 GM/50ML IV SOLN
2.0000 g | INTRAVENOUS | Status: AC
  Administered 2017-10-23: 2 g via INTRAVENOUS
  Filled 2017-10-23: qty 50

## 2017-10-23 MED ORDER — CIPROFLOXACIN IN D5W 400 MG/200ML IV SOLN
400.0000 mg | Freq: Two times a day (BID) | INTRAVENOUS | Status: DC
  Administered 2017-10-24: 400 mg via INTRAVENOUS
  Filled 2017-10-23: qty 200

## 2017-10-23 MED ORDER — ENOXAPARIN SODIUM 30 MG/0.3ML ~~LOC~~ SOLN
30.0000 mg | Freq: Every day | SUBCUTANEOUS | Status: DC
  Filled 2017-10-23 (×2): qty 0.3

## 2017-10-23 MED ORDER — METRONIDAZOLE IN NACL 5-0.79 MG/ML-% IV SOLN
500.0000 mg | Freq: Three times a day (TID) | INTRAVENOUS | Status: DC
  Administered 2017-10-24 – 2017-10-28 (×13): 500 mg via INTRAVENOUS
  Filled 2017-10-23 (×13): qty 100

## 2017-10-23 MED ORDER — METRONIDAZOLE 500 MG PO TABS
500.0000 mg | ORAL_TABLET | Freq: Once | ORAL | Status: AC
  Administered 2017-10-23: 500 mg via ORAL
  Filled 2017-10-23: qty 1

## 2017-10-23 MED ORDER — ONDANSETRON HCL 4 MG/2ML IJ SOLN
4.0000 mg | Freq: Once | INTRAMUSCULAR | Status: AC
Start: 2017-10-23 — End: 2017-10-23
  Administered 2017-10-23: 4 mg via INTRAVENOUS
  Filled 2017-10-23: qty 2

## 2017-10-23 MED ORDER — SODIUM CHLORIDE 0.9 % IV BOLUS (SEPSIS)
1000.0000 mL | Freq: Once | INTRAVENOUS | Status: AC
  Administered 2017-10-23: 1000 mL via INTRAVENOUS

## 2017-10-23 MED ORDER — ONDANSETRON HCL 4 MG PO TABS: 4.0000 mg | ORAL_TABLET | Freq: Four times a day (QID) | ORAL | Status: DC | PRN

## 2017-10-23 MED ORDER — ACETAMINOPHEN 325 MG PO TABS
650.0000 mg | ORAL_TABLET | Freq: Four times a day (QID) | ORAL | Status: DC | PRN
  Administered 2017-10-24: 650 mg via ORAL
  Filled 2017-10-23: qty 2

## 2017-10-23 MED ORDER — BOOST / RESOURCE BREEZE PO LIQD CUSTOM
1.0000 | Freq: Three times a day (TID) | ORAL | Status: DC
  Administered 2017-10-24 – 2017-10-27 (×2): 1 via ORAL

## 2017-10-23 NOTE — Progress Notes (Signed)
Patient ID: Mike Davila MRN: [id number], DOB: 1946/03/15, 71 y.o. Date of Encounter: @DATE @  Chief Complaint:  Chief Complaint  Patient presents with  . Diarrhea    x1 month   . Emesis    x1day     HPI: 71 y.o. year old male  presents with above.   He reports that he has been having diarrhea for about 7-8 weeks. Reports that every time he eats, 12-14 hours later-- he has diarrhea. He reports that anytime he smells food, he feels extremely nauseous like he is going to vomit, but has not vomited at all until this morning. This morning he vomited one episode.  It is the only episode of vomiting he has had. However, he reports that for the past 7 or 8 weeks that he has felt nausea 24 hours a day constant.  Has constantly felt nauseous like he is going to throw up. Has had no area of abdominal pain.  Just the nausea feeling. Has seen no melena or hematochezia. Diarrhea has been watery diarrhea. He has had no recent travel and has not been on antibiotic recently. He reports that on August 1 his son took another job.  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  States that this has caused patient to feel a little upset and emotionally upset.  States that this the only other contributing factor he can think of for me to be aware of. He states that for the past 25 days he has been eating the equivalent of one sausage biscuit per day and has not been eating any more than that for about 25 days now.  States that he is also noticed having decreased urine output.  Reports that he is feeling significantly weak. Reports that when we had the recent snowstorm that that day he was not physically able to walk to the barn.  Was too weak to walk through that deep snow.  Ever since then he has continued to stay in the house and has not gone out to the barn and has not been working.  Asked who is taking care of the cows.  He has a dairy farm.  States that his brother and  his son have been taking care of the dairy farm.  Reports that he has not been out of the house for 3 weeks. He is aware of no other additional associated symptoms.  Has had no known fevers or chills.  Reviewed weights in our chart. August 2017 --- 192 pounds 11/28/2016 -------- 202 pounds Today ----------- 176 pounds  Blood Pressure here today is 128/84.  Patient reports that he does feel weak.  However every time he stands up he states that he is not feeling lightheaded or presyncope with that.   Past Medical History:  Diagnosis Date  . Hematoma of leg    left leg  . Hernia    2  . History of tobacco use   . Hyperglycemia   . Hyperlipidemia   . Hypertension   . Palpitations      Home Meds: Outpatient Medications Prior to Visit  Medication Sig Dispense Refill  . amLODipine (NORVASC) 10 MG tablet TAKE ONE TABLET BY MOUTH DAILY. 30 tablet 0  . aspirin (CHILDRENS ASPIRIN) 81 MG chewable tablet Chew 81 mg by mouth daily.    . benazepril (LOTENSIN) 20 MG tablet TAKE ONE TABLET BY MOUTH DAILY. 30 tablet 0  . cetirizine (ZYRTEC) 10  MG tablet Take 1 tablet (10 mg total) by mouth daily. 30 tablet 11  . ibuprofen (ADVIL,MOTRIN) 100 MG tablet Take 100 mg by mouth every 6 (six) hours as needed.      . metoprolol succinate (TOPROL-XL) 25 MG 24 hr tablet TAKE ONE TABLET BY MOUTH DAILY. 30 tablet 0  . pravastatin (PRAVACHOL) 80 MG tablet Take 1 tablet (80 mg total) by mouth daily. 90 tablet 1  . sildenafil (VIAGRA) 100 MG tablet Take 1 tablet (100 mg total) by mouth daily as needed. 10 tablet 11  . predniSONE (DELTASONE) 20 MG tablet Take 3 daily for 2 days, then 2 daily for 2 days, then 1 daily for 2 days. 12 tablet 0   No facility-administered medications prior to visit.     Allergies:  Allergies  Allergen Reactions  . Crestor [Rosuvastatin Calcium]   . Lipitor [Atorvastatin Calcium]   . Sulfa Antibiotics   . Tetanus Toxoids     Social History   Socioeconomic History  .  Marital status: Single    Spouse name: Not on file  . Number of children: Not on file  . Years of education: Not on file  . Highest education level: Not on file  Social Needs  . Financial resource strain: Not on file  . Food insecurity - worry: Not on file  . Food insecurity - inability: Not on file  . Transportation needs - medical: Not on file  . Transportation needs - non-medical: Not on file  Occupational History  . Not on file  Tobacco Use  . Smoking status: Former Research scientist (life sciences)  . Smokeless tobacco: Current User    Types: Chew  Substance and Sexual Activity  . Alcohol use: No  . Drug use: No  . Sexual activity: Yes  Other Topics Concern  . Not on file  Social History Narrative   Married.   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    Family History  Problem Relation Age of Onset  . Heart disease Father        MI     Review of Systems:  See HPI for pertinent ROS. All other ROS negative.    Physical Exam: Blood pressure 128/84, pulse (!) 104, temperature 97.6 F (36.4 C), temperature source Oral, resp. rate 18, weight 80 kg (176 lb 6.4 oz), SpO2 98 %., Body mass index is 28.91 kg/m. General: WNWD WM. Appears in no acute distress. Neck: Supple. No thyromegaly. No lymphadenopathy. Lungs: Clear bilaterally to auscultation without wheezes, rales, or rhonchi. Breathing is unlabored. Heart: RRR with S1 S2. No murmurs, rubs, or gallops. Abdomen: Soft, non-tender, non-distended with normoactive bowel sounds. No hepatomegaly. No rebound/guarding. No obvious abdominal masses.  There is no area of tenderness with palpation of the abdomen. Musculoskeletal:  Strength and tone normal for age. Extremities/Skin: Warm and dry. Neuro: Alert and oriented X 3. Moves all extremities spontaneously. Gait is normal. CNII-XII grossly in tact. Psych:  Responds to questions appropriately with a normal affect.   Results for orders placed or performed in visit on 10/23/17  CBC  Result Value Ref Range   WBC 9.8 3.8 - 10.8  Thousand/uL   RBC 4.13 (L) 4.20 - 5.80 Million/uL   Hemoglobin 12.0 (L) 13.2 - 17.1 g/dL   HCT 34.7 (L) 38.5 - 50.0 %   MCV 84.0 80.0 - 100.0 fL   MCH 29.1 27.0 - 33.0 pg   MCHC 34.6 32.0 - 36.0 g/dL   RDW 14.9 11.0 - 15.0 %   Platelets 290 140 -  400 Thousand/uL     ASSESSMENT AND PLAN:  71 y.o. year old male with  1. Unintentional weight loss CBC run here in the office and got that result while patient here.  This is stable. I was going to give him 2 bags of fluid but nurse unable to get IV started. I am in the process of having him scheduled for a CT abdomen pelvis for today.  He is fasting. I will give him Phenergan to use to keep the nausea controlled so that he will have more appetite. After CT complete, he is to force fluids.  Follow-up with him with results as I get results of the labs and CT. As well I have put in referral to GI and marked this as urgent.  If I have no answers for him once I get results of labs and CT and he will follow-up with GI for further evaluation. He voices understanding and agrees.  Discussed indications for going to ER. - CBC - COMPLETE METABOLIC PANEL WITH GFR - TSH - Amylase - Lipase - Celiac Pnl 2 rflx Endomysial Ab Ttr - Celiac Disease Comprehensive Panel with Reflexes - CT ABDOMEN W CONTRAST; Future - Ambulatory referral to Gastroenterology  2. Weakness - CBC - COMPLETE METABOLIC PANEL WITH GFR - TSH - Amylase - Lipase - Celiac Pnl 2 rflx Endomysial Ab Ttr - Celiac Disease Comprehensive Panel with Reflexes - CT ABDOMEN W CONTRAST; Future - Ambulatory referral to Gastroenterology  3. Nausea and vomiting, intractability of vomiting not specified, unspecified vomiting type - CBC - COMPLETE METABOLIC PANEL WITH GFR - TSH - Amylase - Lipase - CT ABDOMEN W CONTRAST; Future - promethazine (PHENERGAN) 25 MG tablet; Take 1 tablet (25 mg total) by mouth every 6 (six) hours as needed for nausea or vomiting.  Dispense: 30 tablet; Refill:  0 - Ambulatory referral to Gastroenterology  4. Diarrhea, unspecified type - CBC - COMPLETE METABOLIC PANEL WITH GFR - TSH - Amylase - Lipase - Celiac Pnl 2 rflx Endomysial Ab Ttr - Celiac Disease Comprehensive Panel with Reflexes - CT ABDOMEN W CONTRAST; Future - Ambulatory referral to Gastroenterology   Signed, Olean Ree [place], [place], [facility] 10/23/2017 1:20 PM

## 2017-10-23 NOTE — ED Notes (Signed)
Pt sent for CT by PCP  pt noted to have creatinine of 5.7 after CT  Called PCP who instructed Rad to bring pt to ED for eval

## 2017-10-23 NOTE — H&P (Signed)
History and Physical    Mike Davila TMH:[id number] DOB: 30-May-1946 DOA: 10/23/2017  PCP: [name], MD   Patient coming from: Home.  I have personally briefly reviewed patient's old medical records in [place]  Chief Complaint: Abnormal lab results.  HPI: Mike Davila is a 71 y.o. male with medical history significant of hematoma of the left lower extremity, bilateral inguinal hernia surgical repair, previous tobacco use, hyperglycemia, hypertriglyceridemia, hypertension, palpitations who is coming to the emergency department referred by his primary care doctor due to abnormal lab results.  The patient states that for the past 6-8 weeks he has been having multiple episodes of diarrhea.  He mentions that each time he eats, he subsequently has diarrhea.  He mentions that the smell of food makes him very nauseous, but he has not vomited until he had an episode of emesis today in the morning.  He has loss of about 26 pounds. He denies abdominal pain, melena or hematochezia. Today, he went to see his primary care provider due to these issues.  Lab work and a CT scan abdomen/pelvis with contrast were ordered.  However, his creatinine level was elevated from baseline and a noncontrast CT was ordered.  He was subsequently asked to come to the emergency department for further evaluation and treatment.  He mentions that he has been urinating very little the past 3-4 days, but had not urinated today, until he was given an IV fluid bolus in the emergency department.  He denies fever, chills, dyspnea, chest pain, palpitations, dizziness, diaphoresis, PND, orthopnea or pitting edema of the lower extremities.  He denies dysuria, frequency or hematuria, but has been oliguric for the past few days.  ED Course: Initial vital signs temperature 98.74F, pulse 115, respirations 18, blood pressure 126/82 and O2 sat 96% on room air.    His workup shows a CBC with a white count of 9.0 and  normal differential, hemoglobin 10.5 g/dL and platelets 236.  Sodium 137, potassium 5.4, chloride 109 and CO2 17 mmol/L.  He has a normal anion gap.  His glucose was 116, BUN 97, creatinine 4.75 and calcium 11.6 mg/dL.  LFTs were unremarkable.   CT abdomen/pelvis without contrast 1. Mild sigmoid diverticulitis.  No perforation or abscess. 2. 9 mm calculus in the mid to distal right ureter, without evidence of hydronephrosis. 3. Additional 7 mm nonobstructing calculus in the upper pole of the left kidney. Two small layering calculi within the bladder. 4. Marked prostatomegaly with mass effect on the left posterior bladder wall. 5.  Aortic atherosclerosis (ICD10-I70.0).  Please see images and full radiology report for further detail.  Treatment in ED.  The patient received a 1 L normal saline bolus, Zofran 4 mg IVPB x1, Cipro 400 mg and metronidazole 500 mg IVPB.  Patient stated that he felt better after IV fluids.  Review of Systems: As per HPI otherwise 10 point review of systems negative.    Past Medical History:  Diagnosis Date  . Hematoma of leg    left leg  . Hernia    2  . History of tobacco use   . Hyperglycemia   . Hyperlipidemia   . Hypertension   . Palpitations     Past Surgical History:  Procedure Laterality Date  . ANKLE SURGERY     4 or 5 ankle surgeries.  . APPENDECTOMY    . HERNIA REPAIR    . PLANTAR FASCIA SURGERY    . TONSILLECTOMY  reports that he has quit smoking. His smokeless tobacco use includes chew. He reports that he does not drink alcohol or use drugs.  Allergies  Allergen Reactions  . Crestor [Rosuvastatin Calcium]   . Lipitor [Atorvastatin Calcium]   . Sulfa Antibiotics   . Tetanus Toxoids     Family History  Problem Relation Age of Onset  . Thyroid cancer Mother   . Heart disease Father        MI  . Colon cancer Father   . Melanoma Father   . Benign prostatic hyperplasia Brother   . Diabetes Mellitus II Maternal Grandmother     . Ovarian cancer Daughter     Prior to Admission medications   Medication Sig Start Date End Date Taking? Authorizing Provider  amLODipine (NORVASC) 10 MG tablet TAKE ONE TABLET BY MOUTH DAILY. 09/13/17   Orlena Sheldon, PA-C  aspirin (CHILDRENS ASPIRIN) 81 MG chewable tablet Chew 81 mg by mouth daily.    [provider]  benazepril (LOTENSIN) 20 MG tablet TAKE ONE TABLET BY MOUTH DAILY. 09/13/17   Orlena Sheldon, PA-C  cetirizine (ZYRTEC) 10 MG tablet Take 1 tablet (10 mg total) by mouth daily. 11/28/16   Orlena Sheldon, PA-C  ibuprofen (ADVIL,MOTRIN) 100 MG tablet Take 100 mg by mouth every 6 (six) hours as needed.      [provider]  metoprolol succinate (TOPROL-XL) 25 MG 24 hr tablet TAKE ONE TABLET BY MOUTH DAILY. 09/13/17   Orlena Sheldon, PA-C  pravastatin (PRAVACHOL) 80 MG tablet Take 1 tablet (80 mg total) by mouth daily. 09/11/15   Orlena Sheldon, PA-C  promethazine (PHENERGAN) 25 MG tablet Take 1 tablet (25 mg total) by mouth every 6 (six) hours as needed for nausea or vomiting. 10/23/17   Orlena Sheldon, PA-C  sildenafil (VIAGRA) 100 MG tablet Take 1 tablet (100 mg total) by mouth daily as needed. 06/15/16   Orlena Sheldon, PA-C    Physical Exam: Vitals:   10/23/17 2000 10/23/17 2100 10/23/17 2130 10/23/17 2305  BP: (!) 96/47 114/74 109/75 95/71  Pulse: 98 93 100 (!) 101  Resp:      Temp:    98.4 F (36.9 C)  TempSrc:    Oral  SpO2: 97% 96% 98% 97%  Weight:    81 kg (178 lb 9.2 oz)  Height:    5\' 8"  (1.727 m)    Constitutional: Looks chronically ill, but in NAD, calm, comfortable Eyes: PERRL, lids and conjunctivae normal ENMT: Mucous membranes and lips are dry. Posterior pharynx clear of any exudate or lesions. Neck: normal, supple, no masses, no thyromegaly Respiratory: clear to auscultation bilaterally, no wheezing, no crackles. Normal respiratory effort. No accessory muscle use.  Cardiovascular: Tachycardic at 102 bpm, no murmurs / rubs / gallops. No  extremity edema. 2+ pedal pulses. No carotid bruits.  Abdomen: Soft, no tenderness, no masses palpated. No hepatosplenomegaly. Bowel sounds positive.  Musculoskeletal: no clubbing / cyanosis. Good ROM, no contractures. Normal muscle tone.  Skin: Multiple hypopigmented and hyperpigmented macules on forearms. Neurologic: CN 2-12 grossly intact. Sensation intact, DTR normal. Strength 5/5 in all 4.  Psychiatric: Normal judgment and insight. Alert and oriented x 4.  Normal mood.    Labs on Admission: I have personally reviewed following labs and imaging studies  CBC: Recent Labs  Lab 10/23/17 1208 10/23/17 1947  WBC 9.8 9.0  NEUTROABS  --  6.9  HGB 12.0* 10.5*  HCT 34.7* 31.1*  MCV 84.0  85.2  PLT 290 474   Basic Metabolic Panel: Recent Labs  Lab 10/23/17 1737 10/23/17 1947  NA  --  137  K  --  5.4*  CL  --  109  CO2  --  17*  GLUCOSE  --  116*  BUN  --  97*  CREATININE 5.70* 4.75*  CALCIUM  --  11.6*  PHOS  --  5.2*   GFR: Estimated Creatinine Clearance: 14 mL/min (A) (by C-G formula based on SCr of 4.75 mg/dL (H)). Liver Function Tests: Recent Labs  Lab 10/23/17 1947  AST 9*  ALT 12*  ALKPHOS 64  BILITOT 0.6  PROT 7.0  ALBUMIN 3.7   No results for input(s): LIPASE, AMYLASE in the last 168 hours. No results for input(s): AMMONIA in the last 168 hours. Coagulation Profile: No results for input(s): INR, PROTIME in the last 168 hours. Cardiac Enzymes: Recent Labs  Lab 10/23/17 2158  CKTOTAL 29*   BNP (last 3 results) No results for input(s): PROBNP in the last 8760 hours. HbA1C: No results for input(s): HGBA1C in the last 72 hours. CBG: No results for input(s): GLUCAP in the last 168 hours. Lipid Profile: No results for input(s): CHOL, HDL, LDLCALC, TRIG, CHOLHDL, LDLDIRECT in the last 72 hours. Thyroid Function Tests: No results for input(s): TSH, T4TOTAL, FREET4, T3FREE, THYROIDAB in the last 72 hours. Anemia Panel: No results for input(s): VITAMINB12,  FOLATE, FERRITIN, TIBC, IRON, RETICCTPCT in the last 72 hours. Urine analysis:    Component Value Date/Time   COLORURINE STRAW (A) 10/23/2017 2053   APPEARANCEUR CLEAR 10/23/2017 2053   LABSPEC 1.011 10/23/2017 2053   PHURINE 5.0 10/23/2017 2053   GLUCOSEU NEGATIVE 10/23/2017 2053   HGBUR SMALL (A) 10/23/2017 2053   BILIRUBINUR NEGATIVE 10/23/2017 2053   KETONESUR NEGATIVE 10/23/2017 2053   PROTEINUR NEGATIVE 10/23/2017 2053   NITRITE NEGATIVE 10/23/2017 2053   LEUKOCYTESUR TRACE (A) 10/23/2017 2053    Radiological Exams on Admission: Ct Abdomen Pelvis Wo Contrast  Result Date: 10/23/2017 CLINICAL DATA:  Nausea, vomiting, and diarrhea. EXAM: CT ABDOMEN AND PELVIS WITHOUT CONTRAST TECHNIQUE: Multidetector CT imaging of the abdomen and pelvis was performed following the standard protocol without IV contrast. COMPARISON:  None. FINDINGS: Lower chest: No acute abnormality. Hepatobiliary: No focal liver abnormality is seen. No gallstones, gallbladder wall thickening, or biliary dilatation. Pancreas: Unremarkable. No pancreatic ductal dilatation or surrounding inflammatory changes. Spleen: Normal in size without focal abnormality. Adrenals/Urinary Tract: The adrenal glands are unremarkable. Right greater than left renal cortical scarring. There is a 1.7 cm cystic lesion within the midpole of the right kidney, with very thin marginal calcification. There is a 9 mm calculus in the distal right ureter. No right hydronephrosis. There is a 3.7 cm simple cyst in the left kidney. Subcentimeter hyperdense lesion in the left kidney this bone likely a complicated cyst. There is a 7 mm nonobstructing calculus in the upper pole the left kidney. There are two small layering calculi in the bladder. Mild circumferential bladder wall thickening is nonspecific, and may be related to underdistention. Stomach/Bowel: Prominent left-sided colonic diverticulosis with focal short segment wall thickening of the sigmoid  colon, with mild surrounding inflammatory changes. The appendix is surgically absent. The stomach and small bowel are unremarkable. No bowel obstruction. Vascular/Lymphatic: Aortic atherosclerosis. Ectasia of the right common iliac artery. No enlarged abdominal or pelvic lymph nodes. Reproductive: The prostate is markedly enlarged, and indents the left posterior bladder wall. Other: No free fluid or pneumoperitoneum. Musculoskeletal: No  acute or significant osseous findings. IMPRESSION: 1. Mild sigmoid diverticulitis.  No perforation or abscess. 2. 9 mm calculus in the mid to distal right ureter, without evidence of hydronephrosis. 3. Additional 7 mm nonobstructing calculus in the upper pole of the left kidney. Two small layering calculi within the bladder. 4. Marked prostatomegaly with mass effect on the left posterior bladder wall. 5.  Aortic atherosclerosis (ICD10-I70.0). Electronically Signed   By: Titus Dubin M.D.   On: 10/23/2017 18:11    EKG: Independently reviewed. Vent. rate 89 BPM PR interval 142 ms QRS duration 108 ms QT/QTc 342/416 ms P-R-T axes 58 -17 60 Normal sinus rhythm Incomplete right bundle branch block Cannot rule out Anterior infarct , age undetermined Abnormal ECG No previous tracing to compare with.  Assessment/Plan Principal Problem:   Acute renal failure (ARF) (HCC)   AKI (acute kidney injury) ([place]) Seems to be prerenal due to GI losses and decreased oral intake. However, the patient has an enlarged prostate, although he denies  difficulty urinating. Admit to telemetry/inpatient. Continue IV fluids. Hold benazepril. Monitor renal function and electrolytes. Monitor intake and output. Consider expanding workup with no improvement. Consider nephrology consult if no improvement. Check total CK.  Active Problems:   Diverticulitis large intestine Continue ciprofloxacin 400 mg IVPB every 12 hours. Continue metronidazole 500 mg IVPB every 8 hours. Clear liquids  diet. Consider GI evaluation. Consider general surgery evaluation if he worsens.    Hyperkalemia Continue IV hydration. Follow-up potassium level in the morning.    Hypercalcemia Likely due to ARF. Continue IV hydration. Follow-up calcium level in a.m.    Hypertension Hold ACE inhibitor. Continue amlodipine 10 mg p.o. daily. Continue metoprolol 25 mg p.o. daily. Monitor blood pressure, heart rate and renal function.    Hyperglycemia Check hemoglobin A1c. CBG monitoring before meals and bedtime.    Hypertriglyceridemia/Hyperlipidemia Hold pravastatin. Check total CK.    Anemia Monitor hematocrit and hemoglobin. Check anemia panel.     DVT prophylaxis: Lovenox SQ. Code Status: Full code. Family Communication: His wife and daughter were present in the [facility] room. Disposition Plan: Admit for IV hydration and further workup. Consults called: Routine nephrology consult. Admission status: Inpatient/telemetry.   Reubin Milan MD [facility] Pager [phone].  If 7PM-7AM, please contact night-coverage [email or web address] Password [email or web address]  10/23/2017, 11:10 PM

## 2017-10-23 NOTE — ED Notes (Signed)
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

## 2017-10-23 NOTE — ED Provider Notes (Signed)
[facility] EMERGENCY DEPARTMENT Provider Note   CSN: [id number] Arrival date & time: 10/23/17  1754     History   Chief Complaint Chief Complaint  Patient presents with  . Abnormal Lab    HPI Mike Davila is a 71 y.o. male.  HPI  The patient is a 71 year old male, he has a known history of hypertension, hyperlipidemia, he has had diarrhea for the better part of the last 2 months which he states is daily, multiple episodes, watery and brown, he denies any blood, abdominal pain or vomiting but states he has lost his appetite and is nauseated such that he is not eating or drinking anything at all.  The patient reports that this has been progressive and today he went to the doctor for the first time, he was sent for a CT scan and a measure of renal function prior to the CT scan showed acute renal failure.  The patient reports that he has had very little to drink, his urinary output has become significantly decreased over the last couple of months but especially over the last few days.  Symptoms are gradually worsening and have now become severe.  He is on an ACE inhibitor  Past Medical History:  Diagnosis Date  . Hematoma of leg    left leg  . Hernia    2  . History of tobacco use   . Hyperglycemia   . Hyperlipidemia   . Hypertension   . Palpitations     Patient Active Problem List   Diagnosis Date Noted  . Screening for prostate cancer 12/23/2014  . Hyperlipidemia   . Hypertension   . Hyperglycemia   . Hypertriglyceridemia     Past Surgical History:  Procedure Laterality Date  . ANKLE SURGERY    . APPENDECTOMY    . HERNIA REPAIR    . PLANTAR FASCIA SURGERY    . TONSILLECTOMY         Home Medications    Prior to Admission medications   Medication Sig Start Date End Date Taking? Authorizing Provider  amLODipine (NORVASC) 10 MG tablet TAKE ONE TABLET BY MOUTH DAILY. 09/13/17   Orlena Sheldon, PA-C  aspirin (CHILDRENS ASPIRIN) 81 MG chewable tablet Chew  81 mg by mouth daily.    [provider]  benazepril (LOTENSIN) 20 MG tablet TAKE ONE TABLET BY MOUTH DAILY. 09/13/17   Orlena Sheldon, PA-C  cetirizine (ZYRTEC) 10 MG tablet Take 1 tablet (10 mg total) by mouth daily. 11/28/16   Orlena Sheldon, PA-C  ibuprofen (ADVIL,MOTRIN) 100 MG tablet Take 100 mg by mouth every 6 (six) hours as needed.      [provider]  metoprolol succinate (TOPROL-XL) 25 MG 24 hr tablet TAKE ONE TABLET BY MOUTH DAILY. 09/13/17   Orlena Sheldon, PA-C  pravastatin (PRAVACHOL) 80 MG tablet Take 1 tablet (80 mg total) by mouth daily. 09/11/15   Orlena Sheldon, PA-C  promethazine (PHENERGAN) 25 MG tablet Take 1 tablet (25 mg total) by mouth every 6 (six) hours as needed for nausea or vomiting. 10/23/17   Orlena Sheldon, PA-C  sildenafil (VIAGRA) 100 MG tablet Take 1 tablet (100 mg total) by mouth daily as needed. 06/15/16   Orlena Sheldon, PA-C    Family History Family History  Problem Relation Age of Onset  . Heart disease Father        MI    Social History Social History   Tobacco Use  . Smoking  status: Former Research scientist (life sciences)  . Smokeless tobacco: Current User    Types: Chew  Substance Use Topics  . Alcohol use: No  . Drug use: No     Allergies   Crestor [rosuvastatin calcium]; Lipitor [atorvastatin calcium]; Sulfa antibiotics; and Tetanus toxoids   Review of Systems Review of Systems  All other systems reviewed and are negative.    Physical Exam Updated Vital Signs BP 126/82   Pulse (!) 115   Temp 98.1 F (36.7 C) (Oral)   Resp 18   SpO2 96%   Physical Exam  Constitutional: He appears well-developed and well-nourished. No distress.  HENT:  Head: Normocephalic and atraumatic.  Mouth/Throat: Oropharynx is clear and moist. No oropharyngeal exudate.  Eyes: Conjunctivae and EOM are normal. Pupils are equal, round, and reactive to light. Right eye exhibits no discharge. Left eye exhibits no discharge. No scleral icterus.  Neck: Normal range of  motion. Neck supple. No JVD present. No thyromegaly present.  Cardiovascular: Normal rate, regular rhythm, normal heart sounds and intact distal pulses. Exam reveals no gallop and no friction rub.  No murmur heard. Pulmonary/Chest: Effort normal and breath sounds normal. No respiratory distress. He has no wheezes. He has no rales.  Abdominal: Soft. Bowel sounds are normal. He exhibits no distension and no mass. There is no tenderness.  Nontender abdomen, no masses  Musculoskeletal: Normal range of motion. He exhibits no edema or tenderness.  Lymphadenopathy:    He has no cervical adenopathy.  Neurological: He is alert. Coordination normal.  Skin: Skin is warm and dry. No rash noted. No erythema.  Psychiatric: He has a normal mood and affect. His behavior is normal.  Nursing note and vitals reviewed.    ED Treatments / Results  Labs (all labs ordered are listed, but only abnormal results are displayed) Labs Reviewed  URINE CULTURE  C DIFFICILE QUICK SCREEN W PCR REFLEX  GASTROINTESTINAL PANEL BY PCR, STOOL (REPLACES STOOL CULTURE)  CBC WITH DIFFERENTIAL/PLATELET  COMPREHENSIVE METABOLIC PANEL  URINALYSIS, ROUTINE W REFLEX MICROSCOPIC  PHOSPHORUS    Radiology Ct Abdomen Pelvis Wo Contrast  Result Date: 10/23/2017 CLINICAL DATA:  Nausea, vomiting, and diarrhea. EXAM: CT ABDOMEN AND PELVIS WITHOUT CONTRAST TECHNIQUE: Multidetector CT imaging of the abdomen and pelvis was performed following the standard protocol without IV contrast. COMPARISON:  None. FINDINGS: Lower chest: No acute abnormality. Hepatobiliary: No focal liver abnormality is seen. No gallstones, gallbladder wall thickening, or biliary dilatation. Pancreas: Unremarkable. No pancreatic ductal dilatation or surrounding inflammatory changes. Spleen: Normal in size without focal abnormality. Adrenals/Urinary Tract: The adrenal glands are unremarkable. Right greater than left renal cortical scarring. There is a 1.7 cm cystic  lesion within the midpole of the right kidney, with very thin marginal calcification. There is a 9 mm calculus in the distal right ureter. No right hydronephrosis. There is a 3.7 cm simple cyst in the left kidney. Subcentimeter hyperdense lesion in the left kidney this bone likely a complicated cyst. There is a 7 mm nonobstructing calculus in the upper pole the left kidney. There are two small layering calculi in the bladder. Mild circumferential bladder wall thickening is nonspecific, and may be related to underdistention. Stomach/Bowel: Prominent left-sided colonic diverticulosis with focal short segment wall thickening of the sigmoid colon, with mild surrounding inflammatory changes. The appendix is surgically absent. The stomach and small bowel are unremarkable. No bowel obstruction. Vascular/Lymphatic: Aortic atherosclerosis. Ectasia of the right common iliac artery. No enlarged abdominal or pelvic lymph nodes. Reproductive: The prostate is  markedly enlarged, and indents the left posterior bladder wall. Other: No free fluid or pneumoperitoneum. Musculoskeletal: No acute or significant osseous findings. IMPRESSION: 1. Mild sigmoid diverticulitis.  No perforation or abscess. 2. 9 mm calculus in the mid to distal right ureter, without evidence of hydronephrosis. 3. Additional 7 mm nonobstructing calculus in the upper pole of the left kidney. Two small layering calculi within the bladder. 4. Marked prostatomegaly with mass effect on the left posterior bladder wall. 5.  Aortic atherosclerosis (ICD10-I70.0). Electronically Signed   By: Titus Dubin M.D.   On: 10/23/2017 18:11    Procedures Procedures (including critical care time)  Medications Ordered in ED Medications  sodium chloride 0.9 % bolus 1,000 mL (not administered)  magnesium sulfate IVPB 2 g 50 mL (not administered)  0.9 %  sodium chloride infusion (not administered)     Initial Impression / Assessment and Plan / ED Course  I have  reviewed the triage vital signs and the nursing notes.  Pertinent labs & imaging results that were available during my care of the patient were reviewed by me and considered in my medical decision making (see chart for details).     The patient does appear dehydrated, he does have acute kidney injury as witnessed by his labs below, he was last checked over a year ago with a creatinine of 1.7 but today it measured over 5.  He does have what appears to be an obstructing renal stone however no hydronephrosis to suggest that this is the cause.  Rather his lack of oral intake with his associated diarrhea is much more likely the source.  I would suspect this is prerenal and thus I will give him IV fluids, discuss with urology as well as the hospitalist and I anticipate admission to the hospital.  There is no other CT findings other than mild diverticulitis however the patient has absolutely no abdominal tenderness or fevers.  The patient will be admitted to the hospital on the hospitalist service  BUN  Date Value Ref Range Status  06/15/2016 28 (H) 7 - 25 mg/dL Final  09/09/2015 20 7 - 25 mg/dL Final  12/23/2014 19 6 - 23 mg/dL Final  06/12/2014 16 6 - 23 mg/dL Final   Creat  Date Value Ref Range Status  06/15/2016 1.70 (H) 0.70 - 1.25 mg/dL Final    Comment:      For patients > or = 70 years of age: The upper reference limit for Creatinine is approximately 13% higher for people identified as African-American.     09/09/2015 1.45 (H) 0.70 - 1.25 mg/dL Final  12/23/2014 1.41 (H) 0.50 - 1.35 mg/dL Final  06/12/2014 1.45 (H) 0.50 - 1.35 mg/dL Final   Creatinine, Ser  Date Value Ref Range Status  10/23/2017 5.70 (H) 0.61 - 1.24 mg/dL Final      Final Clinical Impressions(s) / ED Diagnoses   Final diagnoses:  Acute renal failure, unspecified acute renal failure type [facility])  Diverticulitis of colon    ED Discharge Orders    None       Noemi Chapel, MD 10/30/17 [phone]

## 2017-10-23 NOTE — ED Notes (Signed)
Pt states he is unable to urinate at this time & hasn't been able to all day. RN aware.

## 2017-10-23 NOTE — ED Notes (Signed)
Call for report 

## 2017-10-23 NOTE — ED Triage Notes (Signed)
Pt here for out patient abd CT, creatine was elevated.  diarrhea for over a month.

## 2017-10-23 NOTE — ED Notes (Signed)
Pt vomited approx 400 cc of ? contrast

## 2017-10-24 ENCOUNTER — Inpatient Hospital Stay (HOSPITAL_COMMUNITY): Payer: Medicare Other

## 2017-10-24 ENCOUNTER — Other Ambulatory Visit: Payer: Self-pay

## 2017-10-24 DIAGNOSIS — R9431 Abnormal electrocardiogram [ECG] [EKG]: Secondary | ICD-10-CM

## 2017-10-24 DIAGNOSIS — R197 Diarrhea, unspecified: Secondary | ICD-10-CM

## 2017-10-24 DIAGNOSIS — R739 Hyperglycemia, unspecified: Secondary | ICD-10-CM

## 2017-10-24 DIAGNOSIS — D649 Anemia, unspecified: Secondary | ICD-10-CM

## 2017-10-24 DIAGNOSIS — E875 Hyperkalemia: Secondary | ICD-10-CM

## 2017-10-24 DIAGNOSIS — I10 Essential (primary) hypertension: Secondary | ICD-10-CM

## 2017-10-24 DIAGNOSIS — Z87898 Personal history of other specified conditions: Secondary | ICD-10-CM

## 2017-10-24 DIAGNOSIS — E781 Pure hyperglyceridemia: Secondary | ICD-10-CM

## 2017-10-24 DIAGNOSIS — K5732 Diverticulitis of large intestine without perforation or abscess without bleeding: Secondary | ICD-10-CM

## 2017-10-24 HISTORY — PX: TRANSTHORACIC ECHOCARDIOGRAM: SHX275

## 2017-10-24 HISTORY — DX: Personal history of other specified conditions: Z87.898

## 2017-10-24 LAB — COMPLETE METABOLIC PANEL WITH GFR
AG RATIO: 1.5 (calc) (ref 1.0–2.5)
ALKALINE PHOSPHATASE (APISO): 76 U/L (ref 40–115)
ALT: 8 U/L — AB (ref 9–46)
AST: 4 U/L — AB (ref 10–35)
Albumin: 4.3 g/dL (ref 3.6–5.1)
BUN/Creatinine Ratio: 20 (calc) (ref 6–22)
BUN: 96 mg/dL — ABNORMAL HIGH (ref 7–25)
CALCIUM: 12.7 mg/dL — AB (ref 8.6–10.3)
CHLORIDE: 106 mmol/L (ref 98–110)
CO2: 19 mmol/L — ABNORMAL LOW (ref 20–32)
Creat: 4.84 mg/dL — ABNORMAL HIGH (ref 0.70–1.18)
GFR, EST NON AFRICAN AMERICAN: 11 mL/min/{1.73_m2} — AB (ref >=60)
GFR, Est African American: 13 mL/min/{1.73_m2} — ABNORMAL LOW (ref >=60)
Globulin: 2.8 g/dL (calc) (ref 1.9–3.7)
Glucose, Bld: 106 mg/dL — ABNORMAL HIGH (ref 65–99)
POTASSIUM: 6 mmol/L — AB (ref 3.5–5.3)
SODIUM: 136 mmol/L (ref 135–146)
Total Bilirubin: 0.3 mg/dL (ref 0.2–1.2)
Total Protein: 7.1 g/dL (ref 6.1–8.1)

## 2017-10-24 LAB — BASIC METABOLIC PANEL
Anion gap: 9 (ref 5–15)
BUN: 90 mg/dL — ABNORMAL HIGH (ref 6–20)
CO2: 15 mmol/L — AB (ref 22–32)
Calcium: 10.2 mg/dL (ref 8.9–10.3)
Chloride: 111 mmol/L (ref 101–111)
Creatinine, Ser: 4.3 mg/dL — ABNORMAL HIGH (ref 0.61–1.24)
GFR calc non Af Amer: 13 mL/min — ABNORMAL LOW (ref >60)
GFR, EST AFRICAN AMERICAN: 15 mL/min — AB (ref >60)
GLUCOSE: 99 mg/dL (ref 65–99)
POTASSIUM: 5 mmol/L (ref 3.5–5.1)
Sodium: 135 mmol/L (ref 135–145)

## 2017-10-24 LAB — C DIFFICILE QUICK SCREEN W PCR REFLEX
C DIFFICILE (CDIFF) INTERP: NOT DETECTED
C DIFFICILE (CDIFF) TOXIN: NEGATIVE
C DIFFICLE (CDIFF) ANTIGEN: NEGATIVE

## 2017-10-24 LAB — FOLATE: FOLATE: 12.5 ng/mL (ref >5.9)

## 2017-10-24 LAB — CBC
HEMATOCRIT: 27.4 % — AB (ref 39.0–52.0)
Hemoglobin: 8.9 g/dL — ABNORMAL LOW (ref 13.0–17.0)
MCH: 28.1 pg (ref 26.0–34.0)
MCHC: 32.5 g/dL (ref 30.0–36.0)
MCV: 86.4 fL (ref 78.0–100.0)
Platelets: 214 10*3/uL (ref 150–400)
RBC: 3.17 MIL/uL — AB (ref 4.22–5.81)
RDW: 14.2 % (ref 11.5–15.5)
WBC: 7.4 10*3/uL (ref 4.0–10.5)

## 2017-10-24 LAB — IRON AND TIBC
Iron: 59 ug/dL (ref 45–182)
SATURATION RATIOS: 25 % (ref 17.9–39.5)
TIBC: 241 ug/dL — AB (ref 250–450)
UIBC: 182 ug/dL

## 2017-10-24 LAB — ECHOCARDIOGRAM COMPLETE
HEIGHTINCHES: 68 in
Weight: 2857.16 oz

## 2017-10-24 LAB — HEMOGLOBIN A1C
Hgb A1c MFr Bld: 5.8 % — ABNORMAL HIGH (ref 4.8–5.6)
Mean Plasma Glucose: 119.76 mg/dL

## 2017-10-24 LAB — RETICULOCYTES
RBC.: 3.19 MIL/uL — AB (ref 4.22–5.81)
Retic Count, Absolute: 63.8 10*3/uL (ref 19.0–186.0)
Retic Ct Pct: 2 % (ref 0.4–3.1)

## 2017-10-24 LAB — FERRITIN: Ferritin: 335 ng/mL (ref 24–336)

## 2017-10-24 LAB — AMYLASE: AMYLASE: 82 U/L (ref 21–101)

## 2017-10-24 LAB — OCCULT BLOOD X 1 CARD TO LAB, STOOL: FECAL OCCULT BLD: POSITIVE — AB

## 2017-10-24 LAB — LIPASE: LIPASE: 52 U/L (ref 7–60)

## 2017-10-24 LAB — TSH: TSH: 4.99 m[IU]/L — AB (ref 0.40–4.50)

## 2017-10-24 LAB — VITAMIN B12: VITAMIN B 12: 476 pg/mL (ref 180–914)

## 2017-10-24 MED ORDER — CIPROFLOXACIN IN D5W 400 MG/200ML IV SOLN
400.0000 mg | INTRAVENOUS | Status: DC
  Administered 2017-10-25 – 2017-10-27 (×3): 400 mg via INTRAVENOUS
  Filled 2017-10-24 (×3): qty 200

## 2017-10-24 MED ORDER — AMLODIPINE BESYLATE 5 MG PO TABS
10.0000 mg | ORAL_TABLET | Freq: Every day | ORAL | Status: DC
  Administered 2017-10-25 – 2017-10-28 (×3): 10 mg via ORAL
  Filled 2017-10-24 (×3): qty 2

## 2017-10-24 MED ORDER — FAMOTIDINE 20 MG PO TABS
20.0000 mg | ORAL_TABLET | Freq: Every day | ORAL | Status: DC
  Administered 2017-10-24 – 2017-10-28 (×4): 20 mg via ORAL
  Filled 2017-10-24 (×4): qty 1

## 2017-10-24 MED ORDER — SODIUM BICARBONATE 650 MG PO TABS
650.0000 mg | ORAL_TABLET | Freq: Two times a day (BID) | ORAL | Status: DC
  Administered 2017-10-24 – 2017-10-26 (×5): 650 mg via ORAL
  Filled 2017-10-24 (×5): qty 1

## 2017-10-24 MED ORDER — ASPIRIN 81 MG PO CHEW
81.0000 mg | CHEWABLE_TABLET | Freq: Every day | ORAL | Status: DC
  Administered 2017-10-24 – 2017-10-26 (×3): 81 mg via ORAL
  Filled 2017-10-24 (×3): qty 1

## 2017-10-24 MED ORDER — LORATADINE 10 MG PO TABS
10.0000 mg | ORAL_TABLET | Freq: Every day | ORAL | Status: DC
  Administered 2017-10-24 – 2017-10-28 (×4): 10 mg via ORAL
  Filled 2017-10-24 (×4): qty 1

## 2017-10-24 MED ORDER — METOPROLOL SUCCINATE ER 25 MG PO TB24
25.0000 mg | ORAL_TABLET | Freq: Every day | ORAL | Status: DC
  Administered 2017-10-26 – 2017-10-28 (×3): 25 mg via ORAL
  Filled 2017-10-24 (×4): qty 1

## 2017-10-24 NOTE — Progress Notes (Signed)
PROGRESS NOTE    Mike Davila  GGY:[id number] DOB: 28-May-1946 DOA: 10/23/2017 PCP: [name], MD    Brief Narrative:  Mike Davila is a 72 y.o. male with medical history significant of hematoma of the left lower extremity, bilateral inguinal hernia surgical repair, previous tobacco use, hyperglycemia, hypertriglyceridemia, hypertension, palpitations who is coming to the emergency department referred by his primary care doctor due to abnormal lab results.  The patient states that for the past 6-8 weeks he has been having multiple episodes of diarrhea.  He mentions that each time he eats, he subsequently has diarrhea.  He mentions that the smell of food makes him very nauseous, but he has not vomited until he had an episode of emesis today in the morning.  He has loss of about 26 pounds. He denies abdominal pain, melena or hematochezia. Today, he went to see his primary care provider due to these issues.  Lab work and a CT scan abdomen/pelvis with contrast were ordered.  However, his creatinine level was elevated from baseline and a noncontrast CT was ordered.  He was subsequently asked to come to the emergency department for further evaluation and treatment.  He mentions that he has been urinating very little the past 3-4 days, but had not urinated today, until he was given an IV fluid bolus in the emergency department.  He denies fever, chills, dyspnea, chest pain, palpitations, dizziness, diaphoresis, PND, orthopnea or pitting edema of the lower extremities.  He denies dysuria, frequency or hematuria, but has been oliguric for the past few days.     Assessment & Plan:   Principal Problem:   Acute renal failure (ARF) (HCC) Active Problems:   Hypertension   Hyperglycemia   Hypertriglyceridemia   Hyperlipidemia   Anemia   Hyperkalemia   AKI (acute kidney injury) ([place])   Diverticulitis large intestine   Hypercalcemia   Acute renal failure (ARF) (HCC)   AKI (acute  kidney injury) ([place]) Continue IV fluids Will need outpatient urology as prostate was seen to be enlarged on CT scan Hold benazepril. Nephrology consulted Monitor intake and output. CK of 29  Diverticulitis large intestine Ciprofloxacin 400 mg IV every 12 hours and metronidazole 500 mg IV every 8 hours Clear liquids diet. GI consultation    Hyperkalemia Potassium has improved with IV hydration    Hypercalcemia Likely due to ARF. Continue IV hydration. Follow-up calcium level in a.m.    Hypertension Hold ACE inhibitor even renal failure Continue amlodipine 10 mg p.o. daily. Continue metoprolol 25 mg p.o. daily. Monitor blood pressure, heart rate and renal function.    Hyperglycemia Hemoglobin A1c is pending CBG monitoring before meals and bedtime.    Hypertriglyceridemia/Hyperlipidemia Hold pravastatin.    Anemia H&H slowly downtrending but likely slowly approaching what is patient's norm given that he was significantly volume depleted Iron panel pending     DVT prophylaxis: lovenox Code Status: Full code Family Communication: wife is bedside Disposition Plan: pending evaluation by GI and improvement in kidney function   Consultants:   Gastroenterology  Nephrology  Procedures:   None  Antimicrobials:   None    Subjective: HEENT and evaluated.  He reports nausea and vomiting for at least 8 weeks and a loss of 26 pounds.  He states his last colonoscopy was over 20 years ago secondary to his father having a colonoscopy in 6 months later being diagnosed with colon cancer.  He has not eaten since Saturday which was 4 days ago.  Denies abdominal pain.  Says he feels better today than he did at time of admission.  Objective: Vitals:   10/23/17 2100 10/23/17 2130 10/23/17 2305 10/24/17 0701  BP: 114/74 109/75 95/71 (!) 91/53  Pulse: 93 100 (!) 101 81  Resp:    18  Temp:   98.4 F (36.9 C) (!) 97.5 F (36.4 C)  TempSrc:   Oral Oral  SpO2: 96% 98%  97% 95%  Weight:   81 kg (178 lb 9.2 oz)   Height:   5\' 8"  (1.727 m)     Intake/Output Summary (Last 24 hours) at 10/24/2017 1007 Last data filed at 10/24/2017 0600 Gross per 24 hour  Intake 827.5 ml  Output 1175 ml  Net -347.5 ml   Filed Weights   10/23/17 2305  Weight: 81 kg (178 lb 9.2 oz)    Examination:  General exam: Appears calm and comfortable  Respiratory system: Clear to auscultation. Respiratory effort normal. Cardiovascular system: S1 & S2 heard, RRR. No JVD, murmurs, rubs, gallops or clicks. No pedal edema. Gastrointestinal system: Abdomen is nondistended, soft and nontender. No organomegaly or masses felt. Normal bowel sounds heard. Central nervous system: Alert and oriented. No focal neurological deficits. Extremities: Symmetric 5 x 5 power. Skin: Hypopigmented and hyperpigmented macules on forearms bilaterally Psychiatry: Judgement and insight appear normal. Mood & affect appropriate.     Data Reviewed: I have personally reviewed following labs and imaging studies  CBC: Recent Labs  Lab 10/23/17 1208 10/23/17 1947 10/24/17 0542  WBC 9.8 9.0 7.4  NEUTROABS  --  6.9  --   HGB 12.0* 10.5* 8.9*  HCT 34.7* 31.1* 27.4*  MCV 84.0 85.2 86.4  PLT 290 236 811   Basic Metabolic Panel: Recent Labs  Lab 10/23/17 1208 10/23/17 1737 10/23/17 1947 10/24/17 0542  NA 136  --  137 135  K 6.0*  --  5.4* 5.0  CL 106  --  109 111  CO2 19*  --  17* 15*  GLUCOSE 106*  --  116* 99  BUN 96*  --  97* 90*  CREATININE 4.84* 5.70* 4.75* 4.30*  CALCIUM 12.7*  --  11.6* 10.2  PHOS  --   --  5.2*  --    GFR: Estimated Creatinine Clearance: 15.5 mL/min (A) (by C-G formula based on SCr of 4.3 mg/dL (H)). Liver Function Tests: Recent Labs  Lab 10/23/17 1208 10/23/17 1947  AST 4* 9*  ALT 8* 12*  ALKPHOS  --  64  BILITOT 0.3 0.6  PROT 7.1 7.0  ALBUMIN  --  3.7   Recent Labs  Lab 10/23/17 1208  LIPASE 52  AMYLASE 82   No results for input(s): AMMONIA in the last  168 hours. Coagulation Profile: No results for input(s): INR, PROTIME in the last 168 hours. Cardiac Enzymes: Recent Labs  Lab 10/23/17 2158  CKTOTAL 29*   BNP (last 3 results) No results for input(s): PROBNP in the last 8760 hours. HbA1C: No results for input(s): HGBA1C in the last 72 hours. CBG: No results for input(s): GLUCAP in the last 168 hours. Lipid Profile: No results for input(s): CHOL, HDL, LDLCALC, TRIG, CHOLHDL, LDLDIRECT in the last 72 hours. Thyroid Function Tests: Recent Labs    10/23/17 1208  TSH 4.99*   Anemia Panel: No results for input(s): VITAMINB12, FOLATE, FERRITIN, TIBC, IRON, RETICCTPCT in the last 72 hours. Sepsis Labs: No results for input(s): PROCALCITON, LATICACIDVEN in the last 168 hours.  Recent Results (from the past 240  hour(s))  C difficile quick scan w PCR reflex     Status: None   Collection Time: 10/24/17  4:20 AM  Result Value Ref Range Status   C Diff antigen NEGATIVE NEGATIVE Final   C Diff toxin NEGATIVE NEGATIVE Final   C Diff interpretation No C. difficile detected.  Final         Radiology Studies: Ct Abdomen Pelvis Wo Contrast  Result Date: 10/23/2017 CLINICAL DATA:  Nausea, vomiting, and diarrhea. EXAM: CT ABDOMEN AND PELVIS WITHOUT CONTRAST TECHNIQUE: Multidetector CT imaging of the abdomen and pelvis was performed following the standard protocol without IV contrast. COMPARISON:  None. FINDINGS: Lower chest: No acute abnormality. Hepatobiliary: No focal liver abnormality is seen. No gallstones, gallbladder wall thickening, or biliary dilatation. Pancreas: Unremarkable. No pancreatic ductal dilatation or surrounding inflammatory changes. Spleen: Normal in size without focal abnormality. Adrenals/Urinary Tract: The adrenal glands are unremarkable. Right greater than left renal cortical scarring. There is a 1.7 cm cystic lesion within the midpole of the right kidney, with very thin marginal calcification. There is a 9 mm  calculus in the distal right ureter. No right hydronephrosis. There is a 3.7 cm simple cyst in the left kidney. Subcentimeter hyperdense lesion in the left kidney this bone likely a complicated cyst. There is a 7 mm nonobstructing calculus in the upper pole the left kidney. There are two small layering calculi in the bladder. Mild circumferential bladder wall thickening is nonspecific, and may be related to underdistention. Stomach/Bowel: Prominent left-sided colonic diverticulosis with focal short segment wall thickening of the sigmoid colon, with mild surrounding inflammatory changes. The appendix is surgically absent. The stomach and small bowel are unremarkable. No bowel obstruction. Vascular/Lymphatic: Aortic atherosclerosis. Ectasia of the right common iliac artery. No enlarged abdominal or pelvic lymph nodes. Reproductive: The prostate is markedly enlarged, and indents the left posterior bladder wall. Other: No free fluid or pneumoperitoneum. Musculoskeletal: No acute or significant osseous findings. IMPRESSION: 1. Mild sigmoid diverticulitis.  No perforation or abscess. 2. 9 mm calculus in the mid to distal right ureter, without evidence of hydronephrosis. 3. Additional 7 mm nonobstructing calculus in the upper pole of the left kidney. Two small layering calculi within the bladder. 4. Marked prostatomegaly with mass effect on the left posterior bladder wall. 5.  Aortic atherosclerosis (ICD10-I70.0). Electronically Signed   By: Titus Dubin M.D.   On: 10/23/2017 18:11        Scheduled Meds: . enoxaparin (LOVENOX) injection  30 mg Subcutaneous QHS  . feeding supplement  1 Container Oral TID BM  . mouth rinse  15 mL Mouth Rinse BID  . sodium bicarbonate  650 mg Oral BID   Continuous Infusions: . sodium chloride 135 mL/hr at 10/24/17 0952  . ciprofloxacin 400 mg (10/24/17 0953)  . metronidazole Stopped (10/24/17 0509)     LOS: 1 day    Time spent: 35 minutes     Loretha Stapler,  MD [facility] Pager [phone]  If 7PM-7AM, please contact night-coverage [email or web address] Password [email or web address] 10/24/2017, 10:07 AM

## 2017-10-24 NOTE — Progress Notes (Signed)
*  PRELIMINARY RESULTS* Echocardiogram 2D Echocardiogram has been performed.  [name] 10/24/2017, 9:33 AM

## 2017-10-24 NOTE — Progress Notes (Signed)
Pt refused Lovenox dose and mouthwash tonight. Pt educated that lovenox was for DVT prevention. Pt stated, " I have gotten poked to many times tonight." Will continue to monitor

## 2017-10-24 NOTE — Progress Notes (Addendum)
Verbal order to wait to do EKG this am per Dr. Olevia Bowens

## 2017-10-24 NOTE — Consult Note (Signed)
Reason for Consult: Acute kidney injury superimposed on chronic Referring Physician: [name] is an 73 y.o. male.  HPI: He is a patient who has history of hypertension, headache, chronic renal failure, back pain presently came after he was found to have elevated BUN and creatinine.  According to the patient he has some nausea and diarrhea for the last couple of weeks.  Initially he was having 3-4 bowel movements a day but the last couple of weeks has increased to more than 5/day.  There is no blood.  It is associated with nausea and poor p.o. intake but no vomiting.  Patient also denies any abdominal pain, fever, chills or sweating.  Patient denies any previous history of kidney stone.  Past Medical History:  Diagnosis Date  . Hematoma of leg    left leg  . Hernia    2  . History of tobacco use   . Hyperglycemia   . Hyperlipidemia   . Hypertension   . Palpitations     Past Surgical History:  Procedure Laterality Date  . ANKLE SURGERY     4 or 5 ankle surgeries.  . APPENDECTOMY    . HERNIA REPAIR    . PLANTAR FASCIA SURGERY    . TONSILLECTOMY      Family History  Problem Relation Age of Onset  . Thyroid cancer Mother   . Heart disease Father        MI  . Colon cancer Father   . Melanoma Father   . Benign prostatic hyperplasia Brother   . Diabetes Mellitus II Maternal Grandmother   . Ovarian cancer Daughter     Social History:  reports that he has quit smoking. His smokeless tobacco use includes chew. He reports that he does not drink alcohol or use drugs.  Allergies:  Allergies  Allergen Reactions  . Crestor [Rosuvastatin Calcium]   . Lipitor [Atorvastatin Calcium]   . Sulfa Antibiotics   . Tetanus Toxoids     Medications: I have reviewed the patient's current medications.  Results for orders placed or performed during the hospital encounter of 10/23/17 (from the past 48 hour(s))  CBC with Differential/Platelet     Status: Abnormal    Collection Time: 10/23/17  7:47 PM  Result Value Ref Range   WBC 9.0 4.0 - 10.5 K/uL   RBC 3.65 (L) 4.22 - 5.81 MIL/uL   Hemoglobin 10.5 (L) 13.0 - 17.0 g/dL   HCT 31.1 (L) 39.0 - 52.0 %   MCV 85.2 78.0 - 100.0 fL   MCH 28.8 26.0 - 34.0 pg   MCHC 33.8 30.0 - 36.0 g/dL   RDW 14.3 11.5 - 15.5 %   Platelets 236 150 - 400 K/uL   Neutrophils Relative % 77 %   Neutro Abs 6.9 1.7 - 7.7 K/uL   Lymphocytes Relative 14 %   Lymphs Abs 1.3 0.7 - 4.0 K/uL   Monocytes Relative 7 %   Monocytes Absolute 0.7 0.1 - 1.0 K/uL   Eosinophils Relative 2 %   Eosinophils Absolute 0.2 0.0 - 0.7 K/uL   Basophils Relative 0 %   Basophils Absolute 0.0 0.0 - 0.1 K/uL  Comprehensive metabolic panel     Status: Abnormal   Collection Time: 10/23/17  7:47 PM  Result Value Ref Range   Sodium 137 135 - 145 mmol/L   Potassium 5.4 (H) 3.5 - 5.1 mmol/L   Chloride 109 101 - 111 mmol/L   CO2 17 (L) 22 -  32 mmol/L   Glucose, Bld 116 (H) 65 - 99 mg/dL   BUN 97 (H) 6 - 20 mg/dL   Creatinine, Ser 4.75 (H) 0.61 - 1.24 mg/dL   Calcium 11.6 (H) 8.9 - 10.3 mg/dL   Total Protein 7.0 6.5 - 8.1 g/dL   Albumin 3.7 3.5 - 5.0 g/dL   AST 9 (L) 15 - 41 U/L   ALT 12 (L) 17 - 63 U/L   Alkaline Phosphatase 64 38 - 126 U/L   Total Bilirubin 0.6 0.3 - 1.2 mg/dL   GFR calc non Af Amer 11 (L) >60 mL/min   GFR calc Af Amer 13 (L) >60 mL/min    Comment: (NOTE) The eGFR has been calculated using the CKD EPI equation. This calculation has not been validated in all clinical situations. eGFR's persistently <60 mL/min signify possible Chronic Kidney Disease.    Anion gap 11 5 - 15  Phosphorus     Status: Abnormal   Collection Time: 10/23/17  7:47 PM  Result Value Ref Range   Phosphorus 5.2 (H) 2.5 - 4.6 mg/dL  Urinalysis, Routine w reflex microscopic     Status: Abnormal   Collection Time: 10/23/17  8:53 PM  Result Value Ref Range   Color, Urine STRAW (A) YELLOW   APPearance CLEAR CLEAR   Specific Gravity, Urine 1.011 1.005 -  1.030   pH 5.0 5.0 - 8.0   Glucose, UA NEGATIVE NEGATIVE mg/dL   Hgb urine dipstick SMALL (A) NEGATIVE   Bilirubin Urine NEGATIVE NEGATIVE   Ketones, ur NEGATIVE NEGATIVE mg/dL   Protein, ur NEGATIVE NEGATIVE mg/dL   Nitrite NEGATIVE NEGATIVE   Leukocytes, UA TRACE (A) NEGATIVE   RBC / HPF 0-5 0 - 5 RBC/hpf   WBC, UA 0-5 0 - 5 WBC/hpf   Bacteria, UA NONE SEEN NONE SEEN   Squamous Epithelial / LPF 0-5 (A) NONE SEEN  CK     Status: Abnormal   Collection Time: 10/23/17  9:58 PM  Result Value Ref Range   Total CK 29 (L) 49 - 397 U/L  C difficile quick scan w PCR reflex     Status: None   Collection Time: 10/24/17  4:20 AM  Result Value Ref Range   C Diff antigen NEGATIVE NEGATIVE   C Diff toxin NEGATIVE NEGATIVE   C Diff interpretation No C. difficile detected.   Basic metabolic panel     Status: Abnormal   Collection Time: 10/24/17  5:42 AM  Result Value Ref Range   Sodium 135 135 - 145 mmol/L   Potassium 5.0 3.5 - 5.1 mmol/L   Chloride 111 101 - 111 mmol/L   CO2 15 (L) 22 - 32 mmol/L   Glucose, Bld 99 65 - 99 mg/dL   BUN 90 (H) 6 - 20 mg/dL   Creatinine, Ser 4.30 (H) 0.61 - 1.24 mg/dL   Calcium 10.2 8.9 - 10.3 mg/dL   GFR calc non Af Amer 13 (L) >60 mL/min   GFR calc Af Amer 15 (L) >60 mL/min    Comment: (NOTE) The eGFR has been calculated using the CKD EPI equation. This calculation has not been validated in all clinical situations. eGFR's persistently <60 mL/min signify possible Chronic Kidney Disease.    Anion gap 9 5 - 15  CBC     Status: Abnormal   Collection Time: 10/24/17  5:42 AM  Result Value Ref Range   WBC 7.4 4.0 - 10.5 K/uL   RBC 3.17 (L) 4.22 - 5.81  MIL/uL   Hemoglobin 8.9 (L) 13.0 - 17.0 g/dL   HCT 27.4 (L) 39.0 - 52.0 %   MCV 86.4 78.0 - 100.0 fL   MCH 28.1 26.0 - 34.0 pg   MCHC 32.5 30.0 - 36.0 g/dL   RDW 14.2 11.5 - 15.5 %   Platelets 214 150 - 400 K/uL    Ct Abdomen Pelvis Wo Contrast  Result Date: 10/23/2017 CLINICAL DATA:  Nausea,  vomiting, and diarrhea. EXAM: CT ABDOMEN AND PELVIS WITHOUT CONTRAST TECHNIQUE: Multidetector CT imaging of the abdomen and pelvis was performed following the standard protocol without IV contrast. COMPARISON:  None. FINDINGS: Lower chest: No acute abnormality. Hepatobiliary: No focal liver abnormality is seen. No gallstones, gallbladder wall thickening, or biliary dilatation. Pancreas: Unremarkable. No pancreatic ductal dilatation or surrounding inflammatory changes. Spleen: Normal in size without focal abnormality. Adrenals/Urinary Tract: The adrenal glands are unremarkable. Right greater than left renal cortical scarring. There is a 1.7 cm cystic lesion within the midpole of the right kidney, with very thin marginal calcification. There is a 9 mm calculus in the distal right ureter. No right hydronephrosis. There is a 3.7 cm simple cyst in the left kidney. Subcentimeter hyperdense lesion in the left kidney this bone likely a complicated cyst. There is a 7 mm nonobstructing calculus in the upper pole the left kidney. There are two small layering calculi in the bladder. Mild circumferential bladder wall thickening is nonspecific, and may be related to underdistention. Stomach/Bowel: Prominent left-sided colonic diverticulosis with focal short segment wall thickening of the sigmoid colon, with mild surrounding inflammatory changes. The appendix is surgically absent. The stomach and small bowel are unremarkable. No bowel obstruction. Vascular/Lymphatic: Aortic atherosclerosis. Ectasia of the right common iliac artery. No enlarged abdominal or pelvic lymph nodes. Reproductive: The prostate is markedly enlarged, and indents the left posterior bladder wall. Other: No free fluid or pneumoperitoneum. Musculoskeletal: No acute or significant osseous findings. IMPRESSION: 1. Mild sigmoid diverticulitis.  No perforation or abscess. 2. 9 mm calculus in the mid to distal right ureter, without evidence of hydronephrosis. 3.  Additional 7 mm nonobstructing calculus in the upper pole of the left kidney. Two small layering calculi within the bladder. 4. Marked prostatomegaly with mass effect on the left posterior bladder wall. 5.  Aortic atherosclerosis (ICD10-I70.0). Electronically Signed   By: Titus Dubin M.D.   On: 10/23/2017 18:11    Review of Systems  Constitutional: Negative for chills and fever.  Respiratory: Negative for cough, sputum production and shortness of breath.   Cardiovascular: Negative for palpitations, orthopnea and leg swelling.  Gastrointestinal: Positive for diarrhea and nausea. Negative for abdominal pain, blood in stool, melena and vomiting.  Genitourinary: Negative for dysuria, frequency, hematuria and urgency.       Decreased urine output  Musculoskeletal: Positive for back pain.  Neurological: Positive for headaches.  Psychiatric/Behavioral: The patient is nervous/anxious.    Blood pressure (!) 91/53, pulse 81, temperature (!) 97.5 F (36.4 C), temperature source Oral, resp. rate 18, height 5' 8"  (1.727 m), weight 81 kg (178 lb 9.2 oz), SpO2 95 %. Physical Exam  Constitutional: He is oriented to person, place, and time. No distress.  Eyes: No scleral icterus.  Neck: No JVD present.  Cardiovascular: Normal rate and regular rhythm.  Respiratory: No respiratory distress. He has no wheezes.  GI: He exhibits no distension. There is no tenderness.  Musculoskeletal: He exhibits no edema.  Neurological: He is alert and oriented to person, place, and time.  Assessment/Plan: 1] acute kidney injury superimposed on chronic: Etiology seems to be multifactorial including prerenal syndrome/ACE/NSAIDS as patient was taking ibuprofen for headache and also back pain.  Patient is states that he is taking possibly about 12-15 tablets a week but not every day.  He has been taking this for many years. 2] chronic renal failure: Etiology at this moment not clear.  Patient is states that he had history  of nephritis as a child hood and also history of urinary tract infection.  However since then he was doing all right and no history of renal failure.  His blood work showed his creatinine to be 1.41 on 04/11/2013.  Repeat creatinine on 12/23/14 was 1.41.  His last blood work on 06/15/16 his creatinine was 1.70 and EGFR of 41 cc/min hence his stage III.  This could be secondary to NSAIDS/chronic interstitial nephritis/questionable obstructive uropathy as patient has bilateral kidney stone and also history of BPH. 3] hyperkalemia: His potassium has corrected.  This could be secondary to his renal failure/ACE/NSAID S. 4] hypercalcemia possibly secondary to dehydration however other etiologies at this moment cannot be a ruled out.  His calcium has improved with hydration. 5] hypertension: His blood pressure is reasonably controlled 6] low CO2: Possibly metabolic acidosis 7] history of hyperglycemia 8] anemia: Workup is pending. Plan: 1]We will check ANA, complement, hepatitis B surface antigen, hepatitis C antibody 2] we will check 125 vitamin D level and intact PTH 3] we will check 24-hour urine for protein and immunoelectrophoresis 4] agree with hydration and will check his renal panel in the morning. 5] will start patient on sodium bicarbonate 650 mg p.o. twice daily 6] we will decrease IV fluid to 135 cc/h  Shada Nienaber S 10/24/2017, 8:58 AM

## 2017-10-24 NOTE — Consult Note (Signed)
Referring Provider: Eber Jones, MD Primary Care Physician:  [name], MD Primary Gastroenterologist:  Dr. Laural Golden  Reason for Consultation:    Diarrhea of over 4 months duration with weight loss. Abnormal CT suggesting sigmoid diverticulitis.  HPI:   Patient is 72 year old Caucasian male who was in usual state of health until August 2018 when he developed diarrhea.  He felt like he had a virus or a GI bug.  He did not experience fever rectal bleeding abdominal pain nausea or vomiting.  He felt better with Pepto-Bismol but diarrhea would return on stopping this medication.  He had no more than 3-4 bowel movements per day.  However around Thanksgiving he began to have at least 8 stools per day.  Stool was loose without blood or black color.  He did not experience abdominal pain fever or chills.  He did have a single episode of spontaneous vomiting.  He also began to experience nausea and heartburn for which he took Tums with quick relief.  Lately he had been using Tums daily.  Since his diarrhea got worse he began to lose weight.  He states in the last 5-6 weeks he has lost 20 pounds.  All in all he has lost 24 pounds in he feels he lost 4 pounds in the preceding 3 months.  There is no history of pancreatitis. Patient finally decided to see his primary care physician.  He was seen by Ms. Orlena Sheldon PA-C yesterday morning.  He had lab studies and was scheduled for abdominopelvic CT.  However his creatinine was 4.84.  His serum creatinine about a year ago was 1.7.  He was also noted to have serum potassium of 6 calcium of 12.7.  Patient was therefore advised to come to emergency room.  He was begun on IV fluids.  Abdominopelvic CT with contrast was obtained.  It showed aortic atherosclerotic changes stone in upper pole of left kidney and a stone in right ureter without obstructive uropathy.  Is also noted to have left-sided diverticulosis and mild changes sigmoid diverticulitis.  Patient  was hospitalized.  He was begun on IV fluids.  He was seen in consultation by Dr. Lowanda Foster.  Stool studies were ordered.  GI pathogen panel is pending but C. difficile antigen and toxin are negative. Patient reports having a colonoscopy 20 years ago and was normal.  He decided not to have another exam even though his father was diagnosed with colon carcinoma at age 74. Multiple other studies are pending including serum iron TIBC ferritin B12 and folate levels. Ms. Luna Glasgow ordered celiac panel which is also pending. Serum amylase was 82 and lipase 52.  Both of these are normal.  Patient has been taking ibuprofen for back pain.  He has been taking this medication on frequent basis but not daily.  He told Dr. Lowanda Foster that he takes 12-15 pills/day but to me he indicates he takes less than that.  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  He has not worked in the last 1 month.  He smokes cigarettes with 35 years but a pack a day but quit 17 years ago.  He does not drink alcohol.  He is married.  His wife is at bedside.  They have daughter age 53 who was treated for ovarian cancer 20 years earlier and is doing fine.  There is son is 65 years old and in good health.  Mother is 60 years old and lives at home with her  daughter.  She has memory loss.  Mother was treated for thyroid cancer.  Father died at age 22 of MI.  He had surgery for  Colo-rectal carcinoma at age 54 and was also treated for melanoma around the same time. Patient has a brother and a sister but they are both older and doing well.   Past Medical History:  Diagnosis Date   Hypertension    Hyperlipidemia   ED              .    .    .      Past Surgical History:  Procedure Laterality Date  . ANKLE SURGERY     4 or 5 ankle surgeries.  . APPENDECTOMY    . HERNIA REPAIR; bilateral    . PLANTAR FASCIA SURGERY    . TONSILLECTOMY      Prior to Admission medications   Medication Sig Start Date End Date Taking? Authorizing  Provider  amLODipine (NORVASC) 10 MG tablet TAKE ONE TABLET BY MOUTH DAILY. 09/13/17  Yes Dixon, Mary B, PA-C  benazepril (LOTENSIN) 20 MG tablet TAKE ONE TABLET BY MOUTH DAILY. 09/13/17  Yes Dena Billet B, PA-C  ibuprofen (ADVIL,MOTRIN) 200 MG tablet Take 800 mg by mouth every 6 (six) hours as needed.   Yes [provider]  metoprolol succinate (TOPROL-XL) 25 MG 24 hr tablet TAKE ONE TABLET BY MOUTH DAILY. 09/13/17  Yes Orlena Sheldon, PA-C  promethazine (PHENERGAN) 25 MG tablet Take 1 tablet (25 mg total) by mouth every 6 (six) hours as needed for nausea or vomiting. 10/23/17  Yes Dena Billet B, PA-C  sildenafil (VIAGRA) 100 MG tablet Take 1 tablet (100 mg total) by mouth daily as needed. 06/15/16  Yes Orlena Sheldon, PA-C    Current Facility-Administered Medications  Medication Dose Route Frequency Provider Last Rate Last Dose  . 0.9 %  sodium chloride infusion   Intravenous Continuous Fran Lowes, MD 135 mL/hr at 10/24/17 0952    . acetaminophen (TYLENOL) tablet 650 mg  650 mg Oral Q6H PRN Reubin Milan, MD       Or  . acetaminophen (TYLENOL) suppository 650 mg  650 mg Rectal Q6H PRN Reubin Milan, MD      . amLODipine [facility]) tablet 10 mg  10 mg Oral Daily Reubin Milan, MD      . aspirin chewable tablet 81 mg  81 mg Oral Daily Reubin Milan, MD   81 mg at 10/24/17 1146  . ciprofloxacin (CIPRO) IVPB 400 mg  400 mg Intravenous Q12H Reubin Milan, MD   Stopped at 10/24/17 1053  . enoxaparin (LOVENOX) injection 30 mg  30 mg Subcutaneous QHS Reubin Milan, MD      . feeding supplement (BOOST / RESOURCE BREEZE) liquid 1 Container  1 Container Oral TID BM Reubin Milan, MD   1 Container at 10/24/17 1015  . loratadine (CLARITIN) tablet 10 mg  10 mg Oral Daily Reubin Milan, MD   10 mg at 10/24/17 1146  . MEDLINE mouth rinse  15 mL Mouth Rinse BID Reubin Milan, MD   15 mL at 10/24/17 1015  . metoprolol succinate (TOPROL-XL) 24  hr tablet 25 mg  25 mg Oral Daily Reubin Milan, MD      . metroNIDAZOLE (FLAGYL) IVPB 500 mg  500 mg Intravenous Q8H Reubin Milan, MD 100 mL/hr at 10/24/17 1146 500 mg at 10/24/17 1146  . ondansetron (ZOFRAN) tablet 4  mg  4 mg Oral Q6H PRN Reubin Milan, MD       Or  . ondansetron [facility]) injection 4 mg  4 mg Intravenous Q6H PRN Reubin Milan, MD      . sodium bicarbonate tablet 650 mg  650 mg Oral BID Fran Lowes, MD   650 mg at 10/24/17 1015    Allergies as of 10/23/2017 - Review Complete 10/23/2017  Allergen Reaction Noted  . Crestor [rosuvastatin calcium]  05/10/2011  . Lipitor [atorvastatin calcium]  05/10/2011  . Sulfa antibiotics  05/10/2011  . Tetanus toxoids  05/10/2011    Family History  Problem Relation Age of Onset  . Thyroid cancer Mother   . Heart disease Father        MI  . Colon cancer Father   . Melanoma Father   . Benign prostatic hyperplasia Brother   . Diabetes Mellitus II Maternal Grandmother   . Ovarian cancer Daughter     Social History   Socioeconomic History  . Marital status: Single    Spouse name: Not on file  . Number of children: Not on file  . Years of education: Not on file  . Highest education level: Not on file  Social Needs  . Financial resource strain: Not on file  . Food insecurity - worry: Not on file  . Food insecurity - inability: Not on file  . Transportation needs - medical: Not on file  . Transportation needs - non-medical: Not on file  Occupational History  . Not on file  Tobacco Use  . Smoking status: Former Research scientist (life sciences)  . Smokeless tobacco: Current User    Types: Chew  Substance and Sexual Activity  . Alcohol use: No  . Drug use: No  . Sexual activity: Yes  Other Topics Concern  . Not on file  Social History Narrative   Married.   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    Review of Systems: See HPI, otherwise normal ROS  Physical Exam: Temp:  [97.5 F (36.4 C)-98.4 F (36.9 C)] 97.5 F (36.4 C) (01/01  0701) Pulse Rate:  [81-115] 81 (01/01 0701) Resp:  [18] 18 (01/01 0701) BP: (91-163)/(47-151) 91/53 (01/01 0701) SpO2:  [95 %-98 %] 95 % (01/01 0701) Weight:  [178 lb 9.2 oz (81 kg)] 178 lb 9.2 oz (81 kg) (12/31 2305) Last BM Date: 10/23/17  Well-developed well-nourished Caucasian male in NAD. Conjunctivitis pale.  Sclerae nonicteric. Oropharyngeal mucosa is normal.  He has few teeth and lower jaw in poor condition.  He is edentulous in upper jaw. No neck masses or thyromegaly noted. Cardiac exam with regular rhythm normal S1 and S2.  No murmur or gallop noted. Auscultation of lungs revealed was a clear breath sounds bilaterally. Abdomen is symmetrical.  Bowel sounds are normal.  No bruit noted.  On palpation abdomen is soft and nontender without organomegaly or masses. No peripheral edema or clubbing noted.   Lab Results: Recent Labs    10/23/17 1208 10/23/17 1947 10/24/17 0542  WBC 9.8 9.0 7.4  HGB 12.0* 10.5* 8.9*  HCT 34.7* 31.1* 27.4*  PLT 290 236 214   BMET Recent Labs    10/23/17 1208 10/23/17 1737 10/23/17 1947 10/24/17 0542  NA 136  --  137 135  K 6.0*  --  5.4* 5.0  CL 106  --  109 111  CO2 19*  --  17* 15*  GLUCOSE 106*  --  116* 99  BUN 96*  --  97* 90*  CREATININE 4.84* 5.70*  4.75* 4.30*  CALCIUM 12.7*  --  11.6* 10.2   LFT Recent Labs    10/23/17 1947  PROT 7.0  ALBUMIN 3.7  AST 9*  ALT 12*  ALKPHOS 64  BILITOT 0.6   C. difficile antigen negative C. difficile toxin negative. GI pathogen panel pending.   Studies/Results: Ct Abdomen Pelvis Wo Contrast  Result Date: 10/23/2017 CLINICAL DATA:  Nausea, vomiting, and diarrhea. EXAM: CT ABDOMEN AND PELVIS WITHOUT CONTRAST TECHNIQUE: Multidetector CT imaging of the abdomen and pelvis was performed following the standard protocol without IV contrast. COMPARISON:  None. FINDINGS: Lower chest: No acute abnormality. Hepatobiliary: No focal liver abnormality is seen. No gallstones, gallbladder wall  thickening, or biliary dilatation. Pancreas: Unremarkable. No pancreatic ductal dilatation or surrounding inflammatory changes. Spleen: Normal in size without focal abnormality. Adrenals/Urinary Tract: The adrenal glands are unremarkable. Right greater than left renal cortical scarring. There is a 1.7 cm cystic lesion within the midpole of the right kidney, with very thin marginal calcification. There is a 9 mm calculus in the distal right ureter. No right hydronephrosis. There is a 3.7 cm simple cyst in the left kidney. Subcentimeter hyperdense lesion in the left kidney this bone likely a complicated cyst. There is a 7 mm nonobstructing calculus in the upper pole the left kidney. There are two small layering calculi in the bladder. Mild circumferential bladder wall thickening is nonspecific, and may be related to underdistention. Stomach/Bowel: Prominent left-sided colonic diverticulosis with focal short segment wall thickening of the sigmoid colon, with mild surrounding inflammatory changes. The appendix is surgically absent. The stomach and small bowel are unremarkable. No bowel obstruction. Vascular/Lymphatic: Aortic atherosclerosis. Ectasia of the right common iliac artery. No enlarged abdominal or pelvic lymph nodes. Reproductive: The prostate is markedly enlarged, and indents the left posterior bladder wall. Other: No free fluid or pneumoperitoneum. Musculoskeletal: No acute or significant osseous findings. IMPRESSION: 1. Mild sigmoid diverticulitis.  No perforation or abscess. 2. 9 mm calculus in the mid to distal right ureter, without evidence of hydronephrosis. 3. Additional 7 mm nonobstructing calculus in the upper pole of the left kidney. Two small layering calculi within the bladder. 4. Marked prostatomegaly with mass effect on the left posterior bladder wall. 5.  Aortic atherosclerosis (ICD10-I70.0). Electronically Signed   By: Titus Dubin M.D.   On: 10/23/2017 18:11   I have reviewed  abdominopelvic CT; diverticula noted in descending and sigmoid colon with focal wall thickening and sigmoid colon and pericolonic stranding which is mild.  CT also showed cholelithiasis.  Assessment;  Patient is 72 year old Caucasian male who presents with over 27-month history of diarrhea which became worse about 5 weeks ago associated with 24 pound weight loss.  Over the last few weeks he has suffered nausea frequent heartburn and single episode of emesis.  On initial evaluation in the emergency room he was noted to be in renal failure with hyperkalemia hypercalcemia and metabolic acidosis.  Serum potassium and calcium levels have normalized with hydration. Abdominal pelvic CT reveals a nonobstructive stone in left kidney and right ureter as well as left-sided colonic diverticulosis and mild changes of sigmoid diverticulitis.  Patient is now on antibiotics.  Clinically he does not have diverticulitis.  Patient's GI issues can be summarized as below.  #1. Diarrhea of over 4 months duration.  Patient does not appear to be toxic or febrile, therefore infection is less likely.  GI pathogen panel and celiac antibody panel are pending.  He could have low-grade inflammatory bowel disease or  malabsorptive syndrome.  He will need diagnostic colonoscopy in near future.  Colonoscopy may have to be delayed if GI pathogen panel is positive.  #2.  Abnormal CT raising suspicion of diverticulitis.  As noted above his symptoms are not consistent with this diagnosis and so is abdominal exam which is benign.  Will get another opinion on the CT.  #3. Anemia.  No history of melena or rectal bleeding.  Will do Hemoccult and wait for iron studies.  He could have peptic ulcer disease given frequent NSAID use.  If he has iron deficiency he will need EGD and colonoscopy.  #4.  GERD.  He has been using Tums on frequent basis lately.  We will start him on H2B.  #5.  Acute on chronic kidney disease.  Acute injury most likely  secondary to dehydration and NSAID use as he has been taking Advil very frequently if not daily.  #6.  Asymptomatic cholelithiasis.  Recommendations;  Hemoccults x1. Continue IV antibiotics for now. Advance diet to full liquids. Famotidine 20 mg p.o. twice daily. Read review of CT with radiologist in a.m. for second opinion. Timing of colonoscopy to be determined.   LOS: 1 day   Malayasia Mirkin  10/24/2017, 12:12 PM

## 2017-10-24 NOTE — Progress Notes (Signed)
Hold Metoprolol and Amlodipine per Dr Adair Patter. BP is 91/53.  Will continue to monitor.

## 2017-10-24 NOTE — Progress Notes (Signed)
Late entry:  24 hour urine protein urine started at 1040 on 10/24/2017.

## 2017-10-25 DIAGNOSIS — R197 Diarrhea, unspecified: Secondary | ICD-10-CM

## 2017-10-25 DIAGNOSIS — K909 Intestinal malabsorption, unspecified: Secondary | ICD-10-CM

## 2017-10-25 LAB — CBC
HEMATOCRIT: 28.1 % — AB (ref 39.0–52.0)
HEMOGLOBIN: 9.1 g/dL — AB (ref 13.0–17.0)
MCH: 28.4 pg (ref 26.0–34.0)
MCHC: 32.4 g/dL (ref 30.0–36.0)
MCV: 87.8 fL (ref 78.0–100.0)
Platelets: 229 10*3/uL (ref 150–400)
RBC: 3.2 MIL/uL — AB (ref 4.22–5.81)
RDW: 14.7 % (ref 11.5–15.5)
WBC: 7.3 10*3/uL (ref 4.0–10.5)

## 2017-10-25 LAB — RENAL FUNCTION PANEL
Albumin: 3.4 g/dL — ABNORMAL LOW (ref 3.5–5.0)
Anion gap: 8 (ref 5–15)
BUN: 70 mg/dL — ABNORMAL HIGH (ref 6–20)
CHLORIDE: 115 mmol/L — AB (ref 101–111)
CO2: 15 mmol/L — ABNORMAL LOW (ref 22–32)
Calcium: 9.6 mg/dL (ref 8.9–10.3)
Creatinine, Ser: 3.98 mg/dL — ABNORMAL HIGH (ref 0.61–1.24)
GFR calc non Af Amer: 14 mL/min — ABNORMAL LOW (ref >60)
GFR, EST AFRICAN AMERICAN: 16 mL/min — AB (ref >60)
Glucose, Bld: 85 mg/dL (ref 65–99)
POTASSIUM: 5.2 mmol/L — AB (ref 3.5–5.1)
Phosphorus: 3.1 mg/dL (ref 2.5–4.6)
Sodium: 138 mmol/L (ref 135–145)

## 2017-10-25 LAB — CELIAC DISEASE COMPREHENSIVE PANEL WITH REFLEXES
(tTG) Ab, IgA: 1 U/mL
IMMUNOGLOBULIN A: 207 mg/dL (ref 81–463)

## 2017-10-25 LAB — GASTROINTESTINAL PANEL BY PCR, STOOL (REPLACES STOOL CULTURE)

## 2017-10-25 LAB — COMPLEMENT, TOTAL

## 2017-10-25 LAB — C4 COMPLEMENT: COMPLEMENT C4, BODY FLUID: 38 mg/dL (ref 14–44)

## 2017-10-25 LAB — BASIC METABOLIC PANEL
Anion gap: 8 (ref 5–15)
BUN: 70 mg/dL — ABNORMAL HIGH (ref 6–20)
CHLORIDE: 114 mmol/L — AB (ref 101–111)
CO2: 15 mmol/L — ABNORMAL LOW (ref 22–32)
Calcium: 9.6 mg/dL (ref 8.9–10.3)
Creatinine, Ser: 3.96 mg/dL — ABNORMAL HIGH (ref 0.61–1.24)
GFR calc non Af Amer: 14 mL/min — ABNORMAL LOW (ref >60)
GFR, EST AFRICAN AMERICAN: 16 mL/min — AB (ref >60)
Glucose, Bld: 85 mg/dL (ref 65–99)
POTASSIUM: 5.1 mmol/L (ref 3.5–5.1)
SODIUM: 137 mmol/L (ref 135–145)

## 2017-10-25 LAB — PROTEIN, URINE, 24 HOUR
COLLECTION INTERVAL-UPROT: 24 h
Protein, 24H Urine: 660 mg/d — ABNORMAL HIGH (ref 50–100)
Protein, Urine: 22 mg/dL
Urine Total Volume-UPROT: 3000 mL

## 2017-10-25 LAB — PARATHYROID HORMONE, INTACT (NO CA): PTH: 13 pg/mL — ABNORMAL LOW (ref 15–65)

## 2017-10-25 LAB — ANTINUCLEAR ANTIBODIES, IFA: ANA Ab, IFA: NEGATIVE

## 2017-10-25 LAB — HEPATITIS B SURFACE ANTIGEN: Hepatitis B Surface Ag: NEGATIVE

## 2017-10-25 LAB — HEPATITIS C ANTIBODY

## 2017-10-25 LAB — URINE CULTURE: Culture: NO GROWTH

## 2017-10-25 LAB — C3 COMPLEMENT: C3 Complement: 109 mg/dL (ref 82–167)

## 2017-10-25 LAB — EXTRA LAV TOP TUBE

## 2017-10-25 MED ORDER — PREDNISONE 20 MG PO TABS
40.0000 mg | ORAL_TABLET | Freq: Every day | ORAL | Status: DC
  Administered 2017-10-25 – 2017-10-28 (×3): 40 mg via ORAL
  Filled 2017-10-25 (×3): qty 2

## 2017-10-25 NOTE — Plan of Care (Signed)
Pt refuses Boost supplement and Medline mouth rinse; will continue to monitor.

## 2017-10-25 NOTE — Progress Notes (Signed)
Mike Davila  MRN: [id number]  DOB/AGE: 1946-07-17 72 y.o.  Primary Care Physician:Pickard, Cammie Mcgee, MD  Admit date: 10/23/2017  Chief Complaint:  Chief Complaint  Patient presents with  . Abnormal Lab    S-Pt presented on  10/23/2017 with  Chief Complaint  Patient presents with  . Abnormal Lab  .    Pt says " I am feeling  100% better"    Pt wife is present in the room, she voices no new concerns.   Meds . amLODipine  10 mg Oral Daily  . aspirin  81 mg Oral Daily  . enoxaparin (LOVENOX) injection  30 mg Subcutaneous QHS  . famotidine  20 mg Oral Daily  . feeding supplement  1 Container Oral TID BM  . loratadine  10 mg Oral Daily  . mouth rinse  15 mL Mouth Rinse BID  . metoprolol succinate  25 mg Oral Daily  . sodium bicarbonate  650 mg Oral BID       Physical Exam: Vital signs in last 24 hours: Temp:  [97.6 F (36.4 C)-98.3 F (36.8 C)] 97.9 F (36.6 C) (01/02 0645) Pulse Rate:  [85-98] 85 (01/02 0645) Resp:  [18-48] 18 (01/02 0645) BP: (86-96)/(57-64) 96/63 (01/02 0645) SpO2:  [96 %-100 %] 98 % (01/02 0645) Weight change:  Last BM Date: 10/24/17  Intake/Output from previous day: 01/01 0701 - 01/02 0700 In: 4182.8 [P.O.:240; I.V.:3342.8; IV Piggyback:600] Out: -  No intake/output data recorded.   Physical Exam: General- pt is awake,alert, oriented to time place and person Resp- No acute REsp distress, CTA B/L NO Rhonchi CVS- S1S2 regular in rate and rhythm GIT- BS+, soft, NT, ND EXT- NO LE Edema, Cyanosis   Lab Results: CBC Recent Labs    10/24/17 0542 10/25/17 0612  WBC 7.4 7.3  HGB 8.9* 9.1*  HCT 27.4* 28.1*  PLT 214 229    BMET Recent Labs    10/25/17 0612 10/25/17 0616  NA 137 138  K 5.1 5.2*  CL 114* 115*  CO2 15* 15*  GLUCOSE 85 85  BUN 70* 70*  CREATININE 3.96* 3.98*  CALCIUM 9.6 9.6   Creat trend 2018  5.7=> 3.98 2017  1.7 2016   1.4 2015   1.4 2014   1.3-1.4   MICRO Recent Results (from the past 240  hour(s))  C difficile quick scan w PCR reflex     Status: None   Collection Time: 10/24/17  4:20 AM  Result Value Ref Range Status   C Diff antigen NEGATIVE NEGATIVE Final   C Diff toxin NEGATIVE NEGATIVE Final   C Diff interpretation No C. difficile detected.  Final      Lab Results  Component Value Date   CALCIUM 9.6 10/25/2017   PHOS 3.1 10/25/2017               Impression: 1)Renal  AKI secondary to ATN/Prerenal                AKI sec to hypovolemia/NSAIDS                AKi on CKD               CKD stage 3.               CKD since 2014               CKD secondary to NSAIDS/hx of Multiple UTI causing FSGS  Progression of CKD marked with AKI                Proteinura will check.                  AKI better                Creat trending down    2)HTN  Medication- On Calcium Channel Blockers   3)Anemia HGb at goal (9--11)  4)CKD Mineral-Bone Disorder PTH not avail. Secondary Hyperparathyroidism w/u pending . Phosphorus at goal. Calcium is at goal  5)GI- admitted with chronic diarrhea GI and Primary MD following  6)Electrolytes  hyperkalemic    Minimal  NOrmonatremic   7)Acid base Co2 not at goal On po bicarb    Plan:   Will continue current care. Will follow bmet. I did have extensive discussion with pt and his spouse ( after pt permission) regarding his kidney related issues.   Kenden Brandt S 10/25/2017, 9:26 AM

## 2017-10-25 NOTE — Progress Notes (Signed)
Patient c/o pain and swelling of right knee since last night. He said he forgot to tell me this morning. He is able to ambulate but with difficulty. On exam right knee is moderately swollen, no warmth but tender in lower anterior knee. Suspect acute gouty arthritis. Will place him on oral prednisone 40 mg daily x 5 days.

## 2017-10-25 NOTE — Progress Notes (Signed)
PROGRESS NOTE                                                                                                                                                                                                             Patient Demographics:    Mike Davila, is a 72 y.o. male, DOB - Dec 31, 1945, KDT:[id number]  Admit date - 10/23/2017   Admitting Physician Reubin Milan, MD  Outpatient Primary MD for the patient is [name], MD  LOS - 2  Outpatient Specialists: None  Chief Complaint  Patient presents with  . Abnormal Lab       Brief Narrative   72 year old male with hypertension, hyperlipidemia, remote tobacco use presented with 6-8 weeks of persistent diarrhea and almost 30 pound weight loss without associated abdominal pain, melena or hematochezia. He went to PCP on the day of admission where blood work was done which showed acute kidney injury and sent to the ED. Patient admitted for further workup and GI and nephrology consulted.   Subjective:   Patient reports having 3 episodes of watery diarrhea yesterday. Denies abdominal pain.  Assessment  & Plan :    Principal Problem: Acute kidney injury on chronic kidney disease stage III ([place]) Secondary to acute tubular necrosis with dehydration and NSAID use. Per nephrology patient has EKG due to NSAID use and FSGS. Complements and ANA negative. Follow ANCA antibody. Renal function slightly improving in a.m. lab. Avoid nephrotoxins. Monitor closely.  Active Problems: Diarrhea with significant weight loss. Stool for C. difficile negative. Pending GI pathogen panel and celiac antibody. GI suspect she could have inflammatory bowel disease versus malabsorption syndrome. Plan on colonoscopy possibly as inpatient GI pathogen panel negative. CT abdomen with suspicion for diverticulitis, however has no abdominal symptoms besides diarrhea. On empiric Cipro and  Flagyl.  Anemia Hemoccult positive. Iron panel unremarkable. Normal B12. Possibly needs EGD this admission.  Hyperkalemia Monitor with fluids.  Hypercalcemia Likely associated with acute kidney injury. Improved with fluids. PTH low.  Prediabetes Monitor CBG.   Hyperlipidemia  Old statin.  Normal anion gap metabolic acidosis Possibly due to ongoing diarrhea and HTN. Monitor for now.  Code Status : Full code  Family Communication  : Wife at bedside  Disposition Plan  : Pending inpatient workup  Barriers For Discharge : Active symptoms  Consults  :  GI Nephrology   Procedures  :  CT abdomen and pelvis   DVT Prophylaxis  : Lovenox.  Lab Results  Component Value Date   PLT 229 10/25/2017    Antibiotics  :   Anti-infectives (From admission, onward)   Start     Dose/Rate Route Frequency Ordered Stop   10/25/17 0953  ciprofloxacin (CIPRO) IVPB 400 mg     400 mg 200 mL/hr over 60 Minutes Intravenous Every 24 hours 10/24/17 1230     10/24/17 0800  ciprofloxacin (CIPRO) IVPB 400 mg  Status:  Discontinued     400 mg 200 mL/hr over 60 Minutes Intravenous Every 12 hours 10/23/17 2218 10/24/17 1230   10/24/17 0300  metroNIDAZOLE (FLAGYL) IVPB 500 mg     500 mg 100 mL/hr over 60 Minutes Intravenous Every 8 hours 10/23/17 2218     10/23/17 1830  ciprofloxacin (CIPRO) IVPB 400 mg     400 mg 200 mL/hr over 60 Minutes Intravenous  Once 10/23/17 1828 10/23/17 2039   10/23/17 1830  metroNIDAZOLE (FLAGYL) tablet 500 mg     500 mg Oral  Once 10/23/17 1828 10/23/17 1910        Objective:   Vitals:   10/24/17 1416 10/24/17 2006 10/24/17 2031 10/25/17 0645  BP: 93/64 (!) 86/57 95/62 96/63   Pulse: 98 88  85  Resp: (!) 48 18  18  Temp: 98.3 F (36.8 C) 97.6 F (36.4 C)  97.9 F (36.6 C)  TempSrc: Oral Oral  Oral  SpO2: 100% 96%  98%  Weight:      Height:        Wt Readings from Last 3 Encounters:  10/23/17 81 kg (178 lb 9.2 oz)  10/23/17 80 kg (176 lb 6.4 oz)    11/28/16 92 kg (202 lb 12.8 oz)     Intake/Output Summary (Last 24 hours) at 10/25/2017 1545 Last data filed at 10/25/2017 1500 Gross per 24 hour  Intake 4597.5 ml  Output -  Net 4597.5 ml     Physical Exam  Gen: not in distress HEENT: Pallor present, moist mucosa, supple neck Chest: clear b/l, no added sounds CVS: N S1&S2, no murmurs, rubs or gallop GI: soft, NT, ND, BS+ Musculoskeletal: warm, no edema     Data Review:    CBC Recent Labs  Lab 10/23/17 1208 10/23/17 1947 10/24/17 0542 10/25/17 0612  WBC 9.8 9.0 7.4 7.3  HGB 12.0* 10.5* 8.9* 9.1*  HCT 34.7* 31.1* 27.4* 28.1*  PLT 290 236 214 229  MCV 84.0 85.2 86.4 87.8  MCH 29.1 28.8 28.1 28.4  MCHC 34.6 33.8 32.5 32.4  RDW 14.9 14.3 14.2 14.7  LYMPHSABS  --  1.3  --   --   MONOABS  --  0.7  --   --   EOSABS  --  0.2  --   --   BASOSABS  --  0.0  --   --     Chemistries  Recent Labs  Lab 10/23/17 1208 10/23/17 1737 10/23/17 1947 10/24/17 0542 10/25/17 0612 10/25/17 0616  NA 136  --  137 135 137 138  K 6.0*  --  5.4* 5.0 5.1 5.2*  CL 106  --  109 111 114* 115*  CO2 19*  --  17* 15* 15* 15*  GLUCOSE 106*  --  116* 99 85 85  BUN 96*  --  97* 90* 70* 70*  CREATININE 4.84* 5.70* 4.75* 4.30* 3.96* 3.98*  CALCIUM 12.7*  --  11.6*  10.2 9.6 9.6  AST 4*  --  9*  --   --   --   ALT 8*  --  12*  --   --   --   ALKPHOS  --   --  64  --   --   --   BILITOT 0.3  --  0.6  --   --   --    ------------------------------------------------------------------------------------------------------------------ No results for input(s): CHOL, HDL, LDLCALC, TRIG, CHOLHDL, LDLDIRECT in the last 72 hours.  Lab Results  Component Value Date   HGBA1C 5.8 (H) 10/24/2017   ------------------------------------------------------------------------------------------------------------------ Recent Labs    10/23/17 1208  TSH 4.99*    ------------------------------------------------------------------------------------------------------------------ Recent Labs    10/24/17 0929  VITAMINB12 476  FOLATE 12.5  FERRITIN 335  TIBC 241*  IRON 59  RETICCTPCT 2.0    Coagulation profile No results for input(s): INR, PROTIME in the last 168 hours.  No results for input(s): DDIMER in the last 72 hours.  Cardiac Enzymes No results for input(s): CKMB, TROPONINI, MYOGLOBIN in the last 168 hours.  Invalid input(s): CK ------------------------------------------------------------------------------------------------------------------ No results found for: BNP  Inpatient Medications  Scheduled Meds: . amLODipine  10 mg Oral Daily  . aspirin  81 mg Oral Daily  . enoxaparin (LOVENOX) injection  30 mg Subcutaneous QHS  . famotidine  20 mg Oral Daily  . feeding supplement  1 Container Oral TID BM  . loratadine  10 mg Oral Daily  . mouth rinse  15 mL Mouth Rinse BID  . metoprolol succinate  25 mg Oral Daily  . sodium bicarbonate  650 mg Oral BID   Continuous Infusions: . sodium chloride 135 mL/hr at 10/25/17 0950  . ciprofloxacin Stopped (10/25/17 1045)  . metronidazole Stopped (10/25/17 1233)   PRN Meds:.acetaminophen **OR** acetaminophen, ondansetron **OR** ondansetron (ZOFRAN) IV  Micro Results Recent Results (from the past 240 hour(s))  Urine Culture     Status: None   Collection Time: 10/23/17  8:53 PM  Result Value Ref Range Status   Specimen Description URINE, CLEAN CATCH  Final   Special Requests NONE  Final   Culture   Final    NO GROWTH Performed at [facility], [place] [address], [place], [place] [address]    Report Status 10/25/2017 FINAL  Final  C difficile quick scan w PCR reflex     Status: None   Collection Time: 10/24/17  4:20 AM  Result Value Ref Range Status   C Diff antigen NEGATIVE NEGATIVE Final   C Diff toxin NEGATIVE NEGATIVE Final   C Diff interpretation No C. difficile detected.   Final  Gastrointestinal Panel by PCR , Stool     Status: None   Collection Time: 10/24/17  4:20 AM  Result Value Ref Range Status   Campylobacter species NOT DETECTED NOT DETECTED Final   Plesimonas shigelloides NOT DETECTED NOT DETECTED Final   Salmonella species NOT DETECTED NOT DETECTED Final   Yersinia enterocolitica NOT DETECTED NOT DETECTED Final   Vibrio species NOT DETECTED NOT DETECTED Final   Vibrio cholerae NOT DETECTED NOT DETECTED Final   Enteroaggregative E coli (EAEC) NOT DETECTED NOT DETECTED Final   Enteropathogenic E coli (EPEC) NOT DETECTED NOT DETECTED Final   Enterotoxigenic E coli (ETEC) NOT DETECTED NOT DETECTED Final   Shiga like toxin producing E coli (STEC) NOT DETECTED NOT DETECTED Final   Shigella/Enteroinvasive E coli (EIEC) NOT DETECTED NOT DETECTED Final   Cryptosporidium NOT DETECTED NOT DETECTED Final  Cyclospora cayetanensis NOT DETECTED NOT DETECTED Final   Entamoeba histolytica NOT DETECTED NOT DETECTED Final   Giardia lamblia NOT DETECTED NOT DETECTED Final   Adenovirus F40/41 NOT DETECTED NOT DETECTED Final   Astrovirus NOT DETECTED NOT DETECTED Final   Norovirus GI/GII NOT DETECTED NOT DETECTED Final   Rotavirus A NOT DETECTED NOT DETECTED Final   Sapovirus (I, II, IV, and V) NOT DETECTED NOT DETECTED Final    Comment: Performed at [facility], [address] [place], [place] [id number]    Radiology Reports Ct Abdomen Pelvis Wo Contrast  Result Date: 10/23/2017 CLINICAL DATA:  Nausea, vomiting, and diarrhea. EXAM: CT ABDOMEN AND PELVIS WITHOUT CONTRAST TECHNIQUE: Multidetector CT imaging of the abdomen and pelvis was performed following the standard protocol without IV contrast. COMPARISON:  None. FINDINGS: Lower chest: No acute abnormality. Hepatobiliary: No focal liver abnormality is seen. No gallstones, gallbladder wall thickening, or biliary dilatation. Pancreas: Unremarkable. No pancreatic ductal dilatation or surrounding  inflammatory changes. Spleen: Normal in size without focal abnormality. Adrenals/Urinary Tract: The adrenal glands are unremarkable. Right greater than left renal cortical scarring. There is a 1.7 cm cystic lesion within the midpole of the right kidney, with very thin marginal calcification. There is a 9 mm calculus in the distal right ureter. No right hydronephrosis. There is a 3.7 cm simple cyst in the left kidney. Subcentimeter hyperdense lesion in the left kidney this bone likely a complicated cyst. There is a 7 mm nonobstructing calculus in the upper pole the left kidney. There are two small layering calculi in the bladder. Mild circumferential bladder wall thickening is nonspecific, and may be related to underdistention. Stomach/Bowel: Prominent left-sided colonic diverticulosis with focal short segment wall thickening of the sigmoid colon, with mild surrounding inflammatory changes. The appendix is surgically absent. The stomach and small bowel are unremarkable. No bowel obstruction. Vascular/Lymphatic: Aortic atherosclerosis. Ectasia of the right common iliac artery. No enlarged abdominal or pelvic lymph nodes. Reproductive: The prostate is markedly enlarged, and indents the left posterior bladder wall. Other: No free fluid or pneumoperitoneum. Musculoskeletal: No acute or significant osseous findings. IMPRESSION: 1. Mild sigmoid diverticulitis.  No perforation or abscess. 2. 9 mm calculus in the mid to distal right ureter, without evidence of hydronephrosis. 3. Additional 7 mm nonobstructing calculus in the upper pole of the left kidney. Two small layering calculi within the bladder. 4. Marked prostatomegaly with mass effect on the left posterior bladder wall. 5.  Aortic atherosclerosis (ICD10-I70.0). Electronically Signed   By: Titus Dubin M.D.   On: 10/23/2017 18:11    Time Spent in minutes  25   Ethin Drummond M.D on 10/25/2017 at 3:45 PM  Between 7am to 7pm - Pager - [phone]  After 7pm  go to [email or web address] - password [facility]  [facility] -  Office  [phone]

## 2017-10-25 NOTE — Progress Notes (Signed)
  Subjective:  Patient has no complaints.  His appetite is improved.  He is not nauseated.  He denies abdominal pain.  He has noted increased urine output.  He had 2 bowel movements today and each time he passed small amount of stool.  He denies melena or rectal bleeding.  Objective: Blood pressure (!) 95/56, pulse 89, temperature 98.7 F (37.1 C), temperature source Oral, resp. rate 17, height 5\' 8"  (1.727 m), weight 178 lb 9.2 oz (81 kg), SpO2 98 %. Patient is alert and in no acute distress. Abdomen is full.  Bowel sounds are normal.  On palpation it soft and nontender without organomegaly or masses.  Labs/studies Results:  Recent Labs    11/17/17 1947 10/24/17 0542 10/25/17 0612  WBC 9.0 7.4 7.3  HGB 10.5* 8.9* 9.1*  HCT 31.1* 27.4* 28.1*  PLT 236 214 229    BMET  Recent Labs    10/24/17 0542 10/25/17 0612 10/25/17 0616  NA 135 137 138  K 5.0 5.1 5.2*  CL 111 114* 115*  CO2 15* 15* 15*  GLUCOSE 99 85 85  BUN 90* 70* 70*  CREATININE 4.30* 3.96* 3.98*  CALCIUM 10.2 9.6 9.6    LFT  Recent Labs    11/17/2017 1208 2017/11/17 1947 10/25/17 0616  PROT 7.1 7.0  --   ALBUMIN  --  3.7 3.4*  AST 4* 9*  --   ALT 8* 12*  --   ALKPHOS  --  64  --   BILITOT 0.3 0.6  --     PT/INR  No results for input(s): LABPROT, INR in the last 72 hours.  Hepatitis Panel  Recent Labs    10/24/17 0929  HEPBSAG Negative  HCVAB <0.1    Stool is guaiac positive.  GI pathogen panel is negative.  Assessment:  #1.  Diarrhea of over 4 months duration with weight loss.  GI pathogen panel is negative.  Stool is guaiac positive.  He could have low-grade inflammatory bowel disease.  Ms. Orlena Sheldon PA-C had ordered celiac disease panel but for some reason has not been done.  #2.  Mild sigmoid diverticulitis.  CT reviewed with Dr. Lorriane Shire and finding is very soft.  Patient's abdominal exam remains benign.  Since he has no symptoms of diverticulitis I believe we could proceed with  colonoscopy during this admission.  #3.  Anemia.  No evidence of iron B12 or folate deficiency.  Stool is guaiac positive.  Therefore he could be losing blood from his GI tract but I suspect anemia is multifactorial.  #4.  Acute on chronic kidney disease.  Renal function is improving.  Source of acute injury felt to be due to NSAID use and dehydration.  Patient is being followed by Dr. Lowanda Foster of nephrology service.  #5.  Acute right knee gout.  Patient begun on prednisone by Dr. Clementeen Graham.  Recommendations:  Colonoscopy on 10/27/2017.

## 2017-10-25 NOTE — Progress Notes (Signed)
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 CT abdomen/pelivs findings suggest: mild sigmoid diverticulitis.  GI is following and full workup is in process (colonoscopy, celiac antibody -pathogen panel). Acute on chronic kidney disease. BUN on 06/15/16-28 mg/dl, and Cr 1.70 mg/dl. He relays that the past 5-6 weeks he has felt like he had "a stomach flu". He has not been able to tolerate even the smell of food as his wife was cooking. Today he is feeling much better and has consumed 2 bowls of cream soup and ice cream.  His weight is down significantly since Thanksgiving-by 10%. He says his wt had increased up to 200 lb range after he stopped smoking.   1/3- This morning he is not a very good mood. He is complaining that nutrition services didn't send anything he could eat and is not happy with the liquid diet. We talked about alternate options and he is not interested in supplementing protein -says why bother I'm having a colonoscopy later today and it will just be washed out. Talked to him about drinking the [facility] - he doesn't like the taste and is not interested in trying it mixed with a different juice, Sprite or gingerale. He wants [place] to send him a Ribeye.  The patient meets criteria for acute severe malnutrition energy intake </= 50% for >/= 5 days, > 5% wt loss in 1 month. The patient was dehydrated on admission, has acute on chronic CKD, mild sigmoid diverticulitis.   Intake/Output Summary (Last  24 hours) at 10/26/2017 1119 Last data filed at 10/26/2017 0900 Gross per 24 hour  Intake 4247.75 ml  Output -  Net 4247.75 ml   Patient reports much improved urine output.   Recent Labs  Lab 10/23/17 1947  10/25/17 0616 10/26/17 0619 10/26/17 0623  NA 137   < > 138 138 138  K 5.4*   < > 5.2* 5.6* 5.6*  CL 109   < > 115* 116* 116*  CO2 17*   < > 15* 12* 13*  BUN 97*   < > 70* 56* 54*  CREATININE 4.75*   < > 3.98* 3.55* 3.55*  CALCIUM 11.6*   < > 9.6 9.2 9.1  PHOS 5.2*  --  3.1  --  3.3  GLUCOSE 116*   < > 85 127* 129*   < > = values in this interval not displayed.    Labs: potassium 5.2, Cl-115 BUN-70, Cr 3.98. His B-12 -WNL. Albumin 3.4 H/H- 9.1/28.1.    Meds: Pepcid   NUTRITION - FOCUSED PHYSICAL EXAM:  Mild muscle and fat depletions upper body consistent with acute illness. He meets criteria for for acute severe malnutrition.    Diet Order:  Diet clear liquid Room service appropriate? Yes; Fluid consistency: Thin  EDUCATION NEEDS:   Education needs have been addressed Skin:  Skin Assessment: Reviewed RN Assessment  Last BM:  1/1- loose stool (type 7)  Height:   Ht Readings from Last  1 Encounters:  10/23/17 5\' 8"  (1.727 m)    Weight:   Wt Readings from Last 1 Encounters:  10/23/17 178 lb 9.2 oz (81 kg)    Ideal Body Weight:  70 kg  BMI:  Body mass index is 27.15 kg/m.  Estimated Nutritional Needs:   Kcal:  6546-5035  Protein:  55-62 gr   Fluid:  >2.0 liters daily   Colman Cater MS,RD,CSG,LDN Office: #[phone] Pager: [phone]

## 2017-10-26 ENCOUNTER — Encounter (INDEPENDENT_AMBULATORY_CARE_PROVIDER_SITE_OTHER): Payer: Self-pay | Admitting: Internal Medicine

## 2017-10-26 DIAGNOSIS — N170 Acute kidney failure with tubular necrosis: Principal | ICD-10-CM

## 2017-10-26 DIAGNOSIS — E43 Unspecified severe protein-calorie malnutrition: Secondary | ICD-10-CM | POA: Diagnosis present

## 2017-10-26 DIAGNOSIS — M109 Gout, unspecified: Secondary | ICD-10-CM

## 2017-10-26 LAB — RENAL FUNCTION PANEL
Albumin: 3.4 g/dL — ABNORMAL LOW (ref 3.5–5.0)
Anion gap: 9 (ref 5–15)
BUN: 54 mg/dL — ABNORMAL HIGH (ref 6–20)
CALCIUM: 9.1 mg/dL (ref 8.9–10.3)
CO2: 13 mmol/L — AB (ref 22–32)
CREATININE: 3.55 mg/dL — AB (ref 0.61–1.24)
Chloride: 116 mmol/L — ABNORMAL HIGH (ref 101–111)
GFR calc Af Amer: 19 mL/min — ABNORMAL LOW (ref >60)
GFR calc non Af Amer: 16 mL/min — ABNORMAL LOW (ref >60)
GLUCOSE: 129 mg/dL — AB (ref 65–99)
Phosphorus: 3.3 mg/dL (ref 2.5–4.6)
Potassium: 5.6 mmol/L — ABNORMAL HIGH (ref 3.5–5.1)
Sodium: 138 mmol/L (ref 135–145)

## 2017-10-26 LAB — BASIC METABOLIC PANEL
Anion gap: 10 (ref 5–15)
BUN: 56 mg/dL — ABNORMAL HIGH (ref 6–20)
CO2: 12 mmol/L — AB (ref 22–32)
CREATININE: 3.55 mg/dL — AB (ref 0.61–1.24)
Calcium: 9.2 mg/dL (ref 8.9–10.3)
Chloride: 116 mmol/L — ABNORMAL HIGH (ref 101–111)
GFR calc non Af Amer: 16 mL/min — ABNORMAL LOW (ref >60)
GFR, EST AFRICAN AMERICAN: 19 mL/min — AB (ref >60)
GLUCOSE: 127 mg/dL — AB (ref 65–99)
Potassium: 5.6 mmol/L — ABNORMAL HIGH (ref 3.5–5.1)
Sodium: 138 mmol/L (ref 135–145)

## 2017-10-26 LAB — MPO/PR-3 (ANCA) ANTIBODIES: ANCA Proteinase 3: <3.5 U/mL (ref 0.0–3.5)

## 2017-10-26 LAB — CBC
HCT: 28.1 % — ABNORMAL LOW (ref 39.0–52.0)
Hemoglobin: 9 g/dL — ABNORMAL LOW (ref 13.0–17.0)
MCH: 28 pg (ref 26.0–34.0)
MCHC: 32 g/dL (ref 30.0–36.0)
MCV: 87.3 fL (ref 78.0–100.0)
PLATELETS: 221 10*3/uL (ref 150–400)
RBC: 3.22 MIL/uL — ABNORMAL LOW (ref 4.22–5.81)
RDW: 14.6 % (ref 11.5–15.5)
WBC: 6.5 10*3/uL (ref 4.0–10.5)

## 2017-10-26 MED ORDER — SODIUM BICARBONATE 650 MG PO TABS
650.0000 mg | ORAL_TABLET | Freq: Three times a day (TID) | ORAL | Status: DC
  Administered 2017-10-26 – 2017-10-28 (×5): 650 mg via ORAL
  Filled 2017-10-26 (×5): qty 1

## 2017-10-26 MED ORDER — SODIUM POLYSTYRENE SULFONATE 15 GM/60ML PO SUSP
30.0000 g | ORAL | Status: AC
  Administered 2017-10-26 (×2): 30 g via ORAL
  Filled 2017-10-26 (×2): qty 120

## 2017-10-26 MED ORDER — PEG 3350-KCL-NA BICARB-NACL 420 G PO SOLR
4000.0000 mL | Freq: Once | ORAL | Status: AC
  Administered 2017-10-26: 4000 mL via ORAL
  Filled 2017-10-26: qty 4000

## 2017-10-26 MED ORDER — SODIUM CHLORIDE 0.9 % IV SOLN: INTRAVENOUS | Status: DC

## 2017-10-26 NOTE — Progress Notes (Signed)
  Subjective:  Patient has no complaints.  He had one bowel movement this morning when he passed small amount of mushy stool.  He denies melena or rectal bleeding or abdominal pain.  His appetite is much better.  Objective: Blood pressure 102/70, pulse 73, temperature 97.7 F (36.5 C), temperature source Oral, resp. rate 16, height 5\' 8"  (1.727 m), weight 178 lb 9.2 oz (81 kg), SpO2 98 %. Patient is alert and in no acute distress. Abdomen is symmetrical.  Bowel sounds are normal.  On palpation it is soft and nontender without organomegaly or masses. 1+ pitting edema around ankles.  Labs/studies Results:  Recent Labs    11-13-2017 0542 10/25/17 0612 10/26/17 0619  WBC 7.4 7.3 6.5  HGB 8.9* 9.1* 9.0*  HCT 27.4* 28.1* 28.1*  PLT 214 229 221    BMET  Recent Labs    10/25/17 0616 10/26/17 0619 10/26/17 0623  NA 138 138 138  K 5.2* 5.6* 5.6*  CL 115* 116* 116*  CO2 15* 12* 13*  GLUCOSE 85 127* 129*  BUN 70* 56* 54*  CREATININE 3.98* 3.55* 3.55*  CALCIUM 9.6 9.2 9.1    LFT  Recent Labs    10/23/17 1208 10/23/17 1947 10/25/17 0616 10/26/17 0623  PROT 7.1 7.0  --   --   ALBUMIN  --  3.7 3.4* 3.4*  AST 4* 9*  --   --   ALT 8* 12*  --   --   ALKPHOS  --  64  --   --   BILITOT 0.3 0.6  --   --     PT/INR  No results for input(s): LABPROT, INR in the last 72 hours.  Hepatitis Panel  Recent Labs    13-Nov-2017 0929  HEPBSAG Negative  HCVAB <0.1     Assessment:  #1.  Unexplained diarrhea.  He has had diarrhea for more than 4 months associated with weight loss.  GI pathogen panel is negative.  Tissue transglutaminase antibody IgA is normal.  Given that he has heme positive stool he could have low-grade inflammatory bowel disease.  #2.  Anemia.  No evidence of iron deficiency and B12 and folate levels are normal.  Since his stool is heme positive he could have been losing blood from his GI tract.  #3.  Abnormal CT raising concern for mild sigmoid colon diverticulitis.   Abdominal exam is benign.  He remains on antibiotics.  Will consider stopping antibiotics following colonoscopy tomorrow.  #4.  Acute on chronic kidney disease.  Gradual improvement in renal function with hydration.  He has mild hyperkalemia she has been addressed by Dr. Lowanda Foster.   Recommendations:  DC aspirin. DC Lovenox order (which he is not taking). Nulytely prep today. Colonoscopy on 10/27/2017.

## 2017-10-26 NOTE — Progress Notes (Signed)
PROGRESS NOTE                                                                                                                                                                                                             Patient Demographics:    Mike Davila, is a 72 y.o. male, DOB - 1946-07-13, SKA:[id number]  Admit date - 10/23/2017   Admitting Physician Reubin Milan, MD  Outpatient Primary MD for the patient is [name], MD  LOS - 3  Outpatient Specialists: None  Chief Complaint  Patient presents with  . Abnormal Lab       Brief Narrative   72 year old male with hypertension, hyperlipidemia, remote tobacco use presented with 6-8 weeks of persistent diarrhea and almost 30 pound weight loss without associated abdominal pain, melena or hematochezia. He went to PCP on the day of admission where blood work was done which showed acute kidney injury and sent to the ED. Patient admitted for further workup and GI and nephrology consulted.   Subjective:   Only had 1 loose bowel movement today. Right knee pain much improved this morning.  Assessment  & Plan :    Principal Problem: Acute kidney injury on chronic kidney disease stage III ([place]) Secondary to acute tubular necrosis with dehydration and NSAID use. 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 Complements and ANA negative. Follow ANCA antibody. Creatinine unchanged from yesterday. Continue to monitor.  Active Problems: Diarrhea with significant weight loss. Stool for C. difficile and GI pathogen panel negative. Celiac antibody pending.. GI suspect she could have inflammatory bowel disease versus malabsorption syndrome. Colonoscopy tomorrow. CT abdomen with suspicion for diverticulitis, however has no abdominal symptoms besides diarrhea. On empiric Cipro and Flagyl.  Anemia Hemoccult positive. Iron panel unremarkable. Normal B12.  colonoscopy tomorrow.  Hyperkalemia Reportedly drinking orange juice and has been placed on low potassium diet and ordered Kayexalate by nephrology.  Normal anion gap metabolic acidosis Possibly due to ongoing diarrhea and HTN. Nephrology recommends to continue sodium bicarbonate supplement and monitor.  Acute gouty arthritis of right knee Started on 5 day course of oral prednisone with improvement.  Hypercalcemia Likely associated with acute kidney injury. Improved with fluids. PTH low.  Prediabetes Monitor CBG.   Hyperlipidemia  Hold statin    Code Status : Full code  Family Communication  : Wife at bedside  Disposition Plan  : Pending inpatient workup  Barriers For Discharge : Active symptoms  Consults  :   GI Nephrology   Procedures  :  CT abdomen and pelvis   DVT Prophylaxis  : Lovenox.  Lab Results  Component Value Date   PLT 221 10/26/2017    Antibiotics  :   Anti-infectives (From admission, onward)   Start     Dose/Rate Route Frequency Ordered Stop   10/25/17 0953  ciprofloxacin (CIPRO) IVPB 400 mg     400 mg 200 mL/hr over 60 Minutes Intravenous Every 24 hours 10/24/17 1230     10/24/17 0800  ciprofloxacin (CIPRO) IVPB 400 mg  Status:  Discontinued     400 mg 200 mL/hr over 60 Minutes Intravenous Every 12 hours 10/23/17 2218 10/24/17 1230   10/24/17 0300  metroNIDAZOLE (FLAGYL) IVPB 500 mg     500 mg 100 mL/hr over 60 Minutes Intravenous Every 8 hours 10/23/17 2218     10/23/17 1830  ciprofloxacin (CIPRO) IVPB 400 mg     400 mg 200 mL/hr over 60 Minutes Intravenous  Once 10/23/17 1828 10/23/17 2039   10/23/17 1830  metroNIDAZOLE (FLAGYL) tablet 500 mg     500 mg Oral  Once 10/23/17 1828 10/23/17 1910        Objective:   Vitals:   10/25/17 0645 10/25/17 1811 10/25/17 2023 10/26/17 0614  BP: 96/63 (!) 95/56 94/60 102/70  Pulse: 85 89 77 73  Resp: 18 17 16 16   Temp: 97.9 F (36.6 C) 98.7 F (37.1 C) 98.2 F (36.8 C) 97.7 F (36.5 C)   TempSrc: Oral Oral Oral Oral  SpO2: 98% 98% 99% 98%  Weight:      Height:        Wt Readings from Last 3 Encounters:  10/23/17 81 kg (178 lb 9.2 oz)  10/23/17 80 kg (176 lb 6.4 oz)  11/28/16 92 kg (202 lb 12.8 oz)     Intake/Output Summary (Last 24 hours) at 10/26/2017 1412 Last data filed at 10/26/2017 0900 Gross per 24 hour  Intake 4247.75 ml  Output -  Net 4247.75 ml     Physical Exam Gen.: Elderly male not in distress HEENT: Pallor +, moist mucosa, supple neck Chest: Clear bilaterally   CVS: Normal S1 and S2, no murmurs GI: Soft, nondistended, nontender Musculoskeletal: Right knee swelling and tenderness improved from yesterday.   Data Review:    CBC Recent Labs  Lab 10/23/17 1208 10/23/17 1947 10/24/17 0542 10/25/17 0612 10/26/17 0619  WBC 9.8 9.0 7.4 7.3 6.5  HGB 12.0* 10.5* 8.9* 9.1* 9.0*  HCT 34.7* 31.1* 27.4* 28.1* 28.1*  PLT 290 236 214 229 221  MCV 84.0 85.2 86.4 87.8 87.3  MCH 29.1 28.8 28.1 28.4 28.0  MCHC 34.6 33.8 32.5 32.4 32.0  RDW 14.9 14.3 14.2 14.7 14.6  LYMPHSABS  --  1.3  --   --   --   MONOABS  --  0.7  --   --   --   EOSABS  --  0.2  --   --   --   BASOSABS  --  0.0  --   --   --     Chemistries  Recent Labs  Lab 10/23/17 1208  10/23/17 1947 10/24/17 0542 10/25/17 0612 10/25/17 0616 10/26/17 0619 10/26/17 0623  NA 136  --  137 135 137 138 138 138  K 6.0*  --  5.4* 5.0  5.1 5.2* 5.6* 5.6*  CL 106  --  109 111 114* 115* 116* 116*  CO2 19*  --  17* 15* 15* 15* 12* 13*  GLUCOSE 106*  --  116* 99 85 85 127* 129*  BUN 96*  --  97* 90* 70* 70* 56* 54*  CREATININE 4.84*   < > 4.75* 4.30* 3.96* 3.98* 3.55* 3.55*  CALCIUM 12.7*  --  11.6* 10.2 9.6 9.6 9.2 9.1  AST 4*  --  9*  --   --   --   --   --   ALT 8*  --  12*  --   --   --   --   --   ALKPHOS  --   --  64  --   --   --   --   --   BILITOT 0.3  --  0.6  --   --   --   --   --    < > = values in this interval not displayed.    ------------------------------------------------------------------------------------------------------------------ No results for input(s): CHOL, HDL, LDLCALC, TRIG, CHOLHDL, LDLDIRECT in the last 72 hours.  Lab Results  Component Value Date   HGBA1C 5.8 (H) 10/24/2017   ------------------------------------------------------------------------------------------------------------------ No results for input(s): TSH, T4TOTAL, T3FREE, THYROIDAB in the last 72 hours.  Invalid input(s): FREET3 ------------------------------------------------------------------------------------------------------------------ Recent Labs    10/24/17 0929  VITAMINB12 476  FOLATE 12.5  FERRITIN 335  TIBC 241*  IRON 59  RETICCTPCT 2.0    Coagulation profile No results for input(s): INR, PROTIME in the last 168 hours.  No results for input(s): DDIMER in the last 72 hours.  Cardiac Enzymes No results for input(s): CKMB, TROPONINI, MYOGLOBIN in the last 168 hours.  Invalid input(s): CK ------------------------------------------------------------------------------------------------------------------ No results found for: BNP  Inpatient Medications  Scheduled Meds: . amLODipine  10 mg Oral Daily  . famotidine  20 mg Oral Daily  . feeding supplement  1 Container Oral TID BM  . loratadine  10 mg Oral Daily  . mouth rinse  15 mL Mouth Rinse BID  . metoprolol succinate  25 mg Oral Daily  . polyethylene glycol-electrolytes  4,000 mL Oral Once  . predniSONE  40 mg Oral Daily  . sodium bicarbonate  650 mg Oral TID  . sodium polystyrene  30 g Oral Q4H   Continuous Infusions: . sodium chloride 135 mL/hr at 10/26/17 0507  . ciprofloxacin Stopped (10/26/17 1129)  . metronidazole 500 mg (10/26/17 1254)   PRN Meds:.acetaminophen **OR** acetaminophen, ondansetron **OR** ondansetron (ZOFRAN) IV  Micro Results Recent Results (from the past 240 hour(s))  Urine Culture     Status: None   Collection Time:  10/23/17  8:53 PM  Result Value Ref Range Status   Specimen Description URINE, CLEAN CATCH  Final   Special Requests NONE  Final   Culture   Final    NO GROWTH Performed at [facility], [place] [address], [place], [place] [address]    Report Status 10/25/2017 FINAL  Final  C difficile quick scan w PCR reflex     Status: None   Collection Time: 10/24/17  4:20 AM  Result Value Ref Range Status   C Diff antigen NEGATIVE NEGATIVE Final   C Diff toxin NEGATIVE NEGATIVE Final   C Diff interpretation No C. difficile detected.  Final  Gastrointestinal Panel by PCR , Stool     Status: None   Collection Time: 10/24/17  4:20 AM  Result Value Ref  Range Status   Campylobacter species NOT DETECTED NOT DETECTED Final   Plesimonas shigelloides NOT DETECTED NOT DETECTED Final   Salmonella species NOT DETECTED NOT DETECTED Final   Yersinia enterocolitica NOT DETECTED NOT DETECTED Final   Vibrio species NOT DETECTED NOT DETECTED Final   Vibrio cholerae NOT DETECTED NOT DETECTED Final   Enteroaggregative E coli (EAEC) NOT DETECTED NOT DETECTED Final   Enteropathogenic E coli (EPEC) NOT DETECTED NOT DETECTED Final   Enterotoxigenic E coli (ETEC) NOT DETECTED NOT DETECTED Final   Shiga like toxin producing E coli (STEC) NOT DETECTED NOT DETECTED Final   Shigella/Enteroinvasive E coli (EIEC) NOT DETECTED NOT DETECTED Final   Cryptosporidium NOT DETECTED NOT DETECTED Final   Cyclospora cayetanensis NOT DETECTED NOT DETECTED Final   Entamoeba histolytica NOT DETECTED NOT DETECTED Final   Giardia lamblia NOT DETECTED NOT DETECTED Final   Adenovirus F40/41 NOT DETECTED NOT DETECTED Final   Astrovirus NOT DETECTED NOT DETECTED Final   Norovirus GI/GII NOT DETECTED NOT DETECTED Final   Rotavirus A NOT DETECTED NOT DETECTED Final   Sapovirus (I, II, IV, and V) NOT DETECTED NOT DETECTED Final    Comment: Performed at [facility], [address] [place], [place] [address]    Radiology  Reports Ct Abdomen Pelvis Wo Contrast  Result Date: 10/23/2017 CLINICAL DATA:  Nausea, vomiting, and diarrhea. EXAM: CT ABDOMEN AND PELVIS WITHOUT CONTRAST TECHNIQUE: Multidetector CT imaging of the abdomen and pelvis was performed following the standard protocol without IV contrast. COMPARISON:  None. FINDINGS: Lower chest: No acute abnormality. Hepatobiliary: No focal liver abnormality is seen. No gallstones, gallbladder wall thickening, or biliary dilatation. Pancreas: Unremarkable. No pancreatic ductal dilatation or surrounding inflammatory changes. Spleen: Normal in size without focal abnormality. Adrenals/Urinary Tract: The adrenal glands are unremarkable. Right greater than left renal cortical scarring. There is a 1.7 cm cystic lesion within the midpole of the right kidney, with very thin marginal calcification. There is a 9 mm calculus in the distal right ureter. No right hydronephrosis. There is a 3.7 cm simple cyst in the left kidney. Subcentimeter hyperdense lesion in the left kidney this bone likely a complicated cyst. There is a 7 mm nonobstructing calculus in the upper pole the left kidney. There are two small layering calculi in the bladder. Mild circumferential bladder wall thickening is nonspecific, and may be related to underdistention. Stomach/Bowel: Prominent left-sided colonic diverticulosis with focal short segment wall thickening of the sigmoid colon, with mild surrounding inflammatory changes. The appendix is surgically absent. The stomach and small bowel are unremarkable. No bowel obstruction. Vascular/Lymphatic: Aortic atherosclerosis. Ectasia of the right common iliac artery. No enlarged abdominal or pelvic lymph nodes. Reproductive: The prostate is markedly enlarged, and indents the left posterior bladder wall. Other: No free fluid or pneumoperitoneum. Musculoskeletal: No acute or significant osseous findings. IMPRESSION: 1. Mild sigmoid diverticulitis.  No perforation or abscess. 2.  9 mm calculus in the mid to distal right ureter, without evidence of hydronephrosis. 3. Additional 7 mm nonobstructing calculus in the upper pole of the left kidney. Two small layering calculi within the bladder. 4. Marked prostatomegaly with mass effect on the left posterior bladder wall. 5.  Aortic atherosclerosis (ICD10-I70.0). Electronically Signed   By: Titus Dubin M.D.   On: 10/23/2017 18:11    Time Spent in minutes  25   Carmisha Larusso M.D on 10/26/2017 at 2:12 PM  Between 7am to 7pm - Pager - [phone]  After 7pm go to [email or web address] - password  [facility] -  Office  [phone]

## 2017-10-26 NOTE — Progress Notes (Signed)
Subjective: Interval History: has no complaint of nausea or vomiting.  Patient also denies any difficulty breathing..  Objective: Vital signs in last 24 hours: Temp:  [97.7 F (36.5 C)-98.7 F (37.1 C)] 97.7 F (36.5 C) (01/03 0614) Pulse Rate:  [73-89] 73 (01/03 0614) Resp:  [16-17] 16 (01/03 0614) BP: (94-102)/(56-70) 102/70 (01/03 0614) SpO2:  [98 %-99 %] 98 % (01/03 5638) Weight change:   Intake/Output from previous day: 01/02 0701 - 01/03 0700 In: 4007.8 [I.V.:3507.8; IV Piggyback:500] Out: -  Intake/Output this shift: Total I/O In: 240 [P.O.:240] Out: -   General appearance: alert, cooperative and no distress Resp: clear to auscultation bilaterally Cardio: regular rate and rhythm Extremities: No edema  Lab Results: Recent Labs    10/25/17 0612 10/26/17 0619  WBC 7.3 6.5  HGB 9.1* 9.0*  HCT 28.1* 28.1*  PLT 229 221   BMET:  Recent Labs    10/26/17 0619 10/26/17 0623  NA 138 138  K 5.6* 5.6*  CL 116* 116*  CO2 12* 13*  GLUCOSE 127* 129*  BUN 56* 54*  CREATININE 3.55* 3.55*  CALCIUM 9.2 9.1   Recent Labs    10/24/17 0929  PTH 13*   Iron Studies:  Recent Labs    10/24/17 0929  IRON 59  TIBC 241*  FERRITIN 335    Studies/Results: No results found.  I have reviewed the patient's current medications.  Assessment/Plan: 1] acute kidney injury superimposed on chronic.  Etiology for his acute kidney injury was thought to be secondary to prerenal syndrome/ACE/NSAIDS/hypercalcemia.  Presently his renal function is improving.  Patient presently is a symptomatic. 2] hyperkalemia: His potassium has increased after improving.  Etiology was thought to be secondary to combination of ACE inhibitor/worsening of renal failure/ibuprofen and high potassium intake.  His potassium today is 5.6.  Patient has been drinking orange juice. 3] hypercalcemia: His calcium is normal.  When he came calcium was high and PTH was low.  Patient has been consuming lots of  Tums. 4] low CO2: Possibly metabolic.  His is on sodium bicarb CO2 is low but improving 5] anemia: Possibly secondary to chronic renal failure however iron deficiency anemia cannot be ruled out.  Patient for colonoscopy possibly tomorrow. 6] chronic renal failure stage III.  Etiology was thought to be secondary to NSAIDS/chronic interstitial nephritis  Plan: 1]We will put porcine on low potassium diet 2] we will give him Kayexalate 30 g p.o. x2 doses 3] we will check his renal panel in the morning.   LOS: 3 days   Kaarin Pardy S 10/26/2017,11:09 AM

## 2017-10-27 ENCOUNTER — Encounter (HOSPITAL_COMMUNITY): Payer: Self-pay | Admitting: *Deleted

## 2017-10-27 ENCOUNTER — Encounter (HOSPITAL_COMMUNITY)
Admission: EM | Disposition: A | Discharge status: Home / Self Care | Payer: Self-pay | Source: Home / Self Care | Attending: Internal Medicine

## 2017-10-27 DIAGNOSIS — E872 Acidosis: Secondary | ICD-10-CM

## 2017-10-27 DIAGNOSIS — K5730 Diverticulosis of large intestine without perforation or abscess without bleeding: Secondary | ICD-10-CM

## 2017-10-27 DIAGNOSIS — K648 Other hemorrhoids: Secondary | ICD-10-CM

## 2017-10-27 HISTORY — PX: COLONOSCOPY: SHX5424

## 2017-10-27 LAB — RENAL FUNCTION PANEL
Albumin: 3.1 g/dL — ABNORMAL LOW (ref 3.5–5.0)
Anion gap: 9 (ref 5–15)
BUN: 41 mg/dL — ABNORMAL HIGH (ref 6–20)
CALCIUM: 8.1 mg/dL — AB (ref 8.9–10.3)
CO2: 14 mmol/L — AB (ref 22–32)
Chloride: 115 mmol/L — ABNORMAL HIGH (ref 101–111)
Creatinine, Ser: 3 mg/dL — ABNORMAL HIGH (ref 0.61–1.24)
GFR calc non Af Amer: 20 mL/min — ABNORMAL LOW (ref >60)
GFR, EST AFRICAN AMERICAN: 23 mL/min — AB (ref >60)
GLUCOSE: 103 mg/dL — AB (ref 65–99)
PHOSPHORUS: 3.3 mg/dL (ref 2.5–4.6)
POTASSIUM: 4.4 mmol/L (ref 3.5–5.1)
Sodium: 138 mmol/L (ref 135–145)

## 2017-10-27 LAB — CBC
HEMATOCRIT: 25.7 % — AB (ref 39.0–52.0)
HEMOGLOBIN: 8.2 g/dL — AB (ref 13.0–17.0)
MCH: 27.9 pg (ref 26.0–34.0)
MCHC: 31.9 g/dL (ref 30.0–36.0)
MCV: 87.4 fL (ref 78.0–100.0)
Platelets: 228 10*3/uL (ref 150–400)
RBC: 2.94 MIL/uL — AB (ref 4.22–5.81)
RDW: 14.8 % (ref 11.5–15.5)
WBC: 8.3 10*3/uL (ref 4.0–10.5)

## 2017-10-27 SURGERY — COLONOSCOPY
Anesthesia: Moderate Sedation

## 2017-10-27 MED ORDER — STERILE WATER FOR IRRIGATION IR SOLN
Status: DC | PRN
  Administered 2017-10-27: 14:00:00

## 2017-10-27 MED ORDER — MIDAZOLAM HCL 5 MG/5ML IJ SOLN
INTRAMUSCULAR | Status: AC
  Filled 2017-10-27: qty 5

## 2017-10-27 MED ORDER — SODIUM CHLORIDE 0.9 % IV SOLN
Freq: Once | INTRAVENOUS | Status: AC
  Administered 2017-10-27: 14:00:00 via INTRAVENOUS

## 2017-10-27 MED ORDER — MEPERIDINE HCL 50 MG/ML IJ SOLN: INTRAMUSCULAR | Status: DC | PRN

## 2017-10-27 MED ORDER — MIDAZOLAM HCL 5 MG/5ML IJ SOLN
INTRAMUSCULAR | Status: DC | PRN
  Administered 2017-10-27: 1 mg via INTRAVENOUS
  Administered 2017-10-27 (×2): 2 mg via INTRAVENOUS

## 2017-10-27 MED ORDER — FENTANYL CITRATE (PF) 100 MCG/2ML IJ SOLN
INTRAMUSCULAR | Status: DC | PRN
  Administered 2017-10-27 (×4): 25 ug via INTRAVENOUS

## 2017-10-27 MED ORDER — FENTANYL CITRATE (PF) 100 MCG/2ML IJ SOLN
INTRAMUSCULAR | Status: AC
  Filled 2017-10-27: qty 2

## 2017-10-27 NOTE — Progress Notes (Signed)
PROGRESS NOTE                                                                                                                                                                                                             Patient Demographics:    Mike Davila, is a 72 y.o. male, DOB - 1946-05-13, LKG:[id number]  Admit date - 10/23/2017   Admitting Physician Reubin Milan, MD  Outpatient Primary MD for the patient is [name], MD  LOS - 4  Outpatient Specialists: None  Chief Complaint  Patient presents with  . Abnormal Lab       Brief Narrative   72 year old male with hypertension, hyperlipidemia, remote tobacco use presented with 6-8 weeks of persistent diarrhea and almost 30 pound weight loss without associated abdominal pain, melena or hematochezia. He went to PCP on the day of admission where blood work was done which showed acute kidney injury and sent to the ED. Patient admitted for further workup and GI and nephrology consulted.   Subjective:   No further diarrhea. Right knee pain resolved. Patient anxious about his colonoscopy.  Assessment  & Plan :    Principal Problem: Acute kidney injury on chronic kidney disease stage III ([place]) Secondary to acute tubular necrosis with dehydration and NSAID use. 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 Complements , ANA and ANCA negative. Creatinine slowly improving. Nephrology recommends to continue fluids at current rate.  Active Problems: Diarrhea with significant weight loss. Stool for C. difficile and GI pathogen panel negative. Celiac antibody negative.. GI suspect he could have inflammatory bowel disease versus malabsorption syndrome. Colonoscopy today. CT abdomen suspicious for diverticulitis, however has no abdominal symptoms besides diarrhea.  On empiric Cipro and Flagyl which can be discontinued if colonoscopy unremarkable.    Anemia Hemoccult positive. Iron panel unremarkable. Normalfollow with colonoscopy.  Hyperkalemia reportedly drinking orange juice and was placed on low potassium diet. Improved with Kayexalate.   Normal anion gap metabolic acidosis Possibly due to ongoing diarrhea andbicarbonates slowly improving. Nephrology recommends to continue supplement and monitor.  Acute gouty arthritis of right  knee On 5 day course of prednisone with significant improvement.  Hypercalcemia Likely associated with acute kidney injury. Improved with fluids. PTH low.  Prediabetes Monitor CBG.   Hyperlipidemia  Hold statin    Code  Status : Full code  Family Communication  : Wife at bedside  Disposition Plan  : Pending inpatient workup, home possibly in 1-2 days if function continues to improve.   Barriers For Discharge : Active symptoms  Consults  :   GI Nephrology   Procedures  :  CT abdomen and pelvis  colonoscopy this afternoon   DVT Prophylaxis  : Lovenox.  Lab Results  Component Value Date   PLT 228 10/27/2017    Antibiotics  :   Anti-infectives (From admission, onward)   Start     Dose/Rate Route Frequency Ordered Stop   10/25/17 0953  ciprofloxacin (CIPRO) IVPB 400 mg     400 mg 200 mL/hr over 60 Minutes Intravenous Every 24 hours 10/24/17 1230     10/24/17 0800  ciprofloxacin (CIPRO) IVPB 400 mg  Status:  Discontinued     400 mg 200 mL/hr over 60 Minutes Intravenous Every 12 hours 10/23/17 2218 10/24/17 1230   10/24/17 0300  metroNIDAZOLE (FLAGYL) IVPB 500 mg     500 mg 100 mL/hr over 60 Minutes Intravenous Every 8 hours 10/23/17 2218     10/23/17 1830  ciprofloxacin (CIPRO) IVPB 400 mg     400 mg 200 mL/hr over 60 Minutes Intravenous  Once 10/23/17 1828 10/23/17 2039   10/23/17 1830  metroNIDAZOLE (FLAGYL) tablet 500 mg     500 mg Oral  Once 10/23/17 1828 10/23/17 1910        Objective:   Vitals:   10/26/17 0614 10/26/17 2212 10/27/17 0602 10/27/17 1028  BP:  102/70 120/73 96/65 111/65  Pulse: 73 85 70 77  Resp: 16 16 16    Temp: 97.7 F (36.5 C) (!) 97.4 F (36.3 C) (!) 97.5 F (36.4 C)   TempSrc: Oral Oral Oral   SpO2: 98% 100% 99%   Weight:      Height:        Wt Readings from Last 3 Encounters:  10/23/17 81 kg (178 lb 9.2 oz)  10/23/17 80 kg (176 lb 6.4 oz)  11/28/16 92 kg (202 lb 12.8 oz)     Intake/Output Summary (Last 24 hours) at 10/27/2017 1149 Last data filed at 10/27/2017 0620 Gross per 24 hour  Intake 3897.5 ml  Output -  Net 3897.5 ml     Physical Exam Gen.: Elderly male not in distress HEENT: Pallor present, moist mucosa  chest: Clear bilaterally  CVS: Normal S1 and S2, no murmurs GI: Soft, nondistended, nontender Musculoskeletal: Warm, no edema, right knee swelling and tenderness resolved     Data Review:    CBC Recent Labs  Lab 10/23/17 1947 10/24/17 0542 10/25/17 0612 10/26/17 0619 10/27/17 0548  WBC 9.0 7.4 7.3 6.5 8.3  HGB 10.5* 8.9* 9.1* 9.0* 8.2*  HCT 31.1* 27.4* 28.1* 28.1* 25.7*  PLT 236 214 229 221 228  MCV 85.2 86.4 87.8 87.3 87.4  MCH 28.8 28.1 28.4 28.0 27.9  MCHC 33.8 32.5 32.4 32.0 31.9  RDW 14.3 14.2 14.7 14.6 14.8  LYMPHSABS 1.3  --   --   --   --   MONOABS 0.7  --   --   --   --   EOSABS 0.2  --   --   --   --   BASOSABS 0.0  --   --   --   --     Chemistries  Recent Labs  Lab 10/23/17 1208  10/23/17 1947  10/25/17 0612 10/25/17 2119 10/26/17 4174 10/26/17 0814 10/27/17 4818  NA 136  --  137   < > 137 138 138 138 138  K 6.0*  --  5.4*   < > 5.1 5.2* 5.6* 5.6* 4.4  CL 106  --  109   < > 114* 115* 116* 116* 115*  CO2 19*  --  17*   < > 15* 15* 12* 13* 14*  GLUCOSE 106*  --  116*   < > 85 85 127* 129* 103*  BUN 96*  --  97*   < > 70* 70* 56* 54* 41*  CREATININE 4.84*   < > 4.75*   < > 3.96* 3.98* 3.55* 3.55* 3.00*  CALCIUM 12.7*  --  11.6*   < > 9.6 9.6 9.2 9.1 8.1*  AST 4*  --  9*  --   --   --   --   --   --   ALT 8*  --  12*  --   --   --   --   --   --   ALKPHOS   --   --  64  --   --   --   --   --   --   BILITOT 0.3  --  0.6  --   --   --   --   --   --    < > = values in this interval not displayed.   ------------------------------------------------------------------------------------------------------------------ No results for input(s): CHOL, HDL, LDLCALC, TRIG, CHOLHDL, LDLDIRECT in the last 72 hours.  Lab Results  Component Value Date   HGBA1C 5.8 (H) 10/24/2017   ------------------------------------------------------------------------------------------------------------------ No results for input(s): TSH, T4TOTAL, T3FREE, THYROIDAB in the last 72 hours.  Invalid input(s): FREET3 ------------------------------------------------------------------------------------------------------------------ No results for input(s): VITAMINB12, FOLATE, FERRITIN, TIBC, IRON, RETICCTPCT in the last 72 hours.  Coagulation profile No results for input(s): INR, PROTIME in the last 168 hours.  No results for input(s): DDIMER in the last 72 hours.  Cardiac Enzymes No results for input(s): CKMB, TROPONINI, MYOGLOBIN in the last 168 hours.  Invalid input(s): CK ------------------------------------------------------------------------------------------------------------------ No results found for: BNP  Inpatient Medications  Scheduled Meds: . amLODipine  10 mg Oral Daily  . famotidine  20 mg Oral Daily  . feeding supplement  1 Container Oral TID BM  . loratadine  10 mg Oral Daily  . mouth rinse  15 mL Mouth Rinse BID  . metoprolol succinate  25 mg Oral Daily  . predniSONE  40 mg Oral Daily  . sodium bicarbonate  650 mg Oral TID   Continuous Infusions: . sodium chloride 135 mL/hr at 10/27/17 0834  . sodium chloride    . ciprofloxacin 400 mg (10/27/17 1027)  . metronidazole Stopped (10/27/17 0303)   PRN Meds:.acetaminophen **OR** acetaminophen, ondansetron **OR** ondansetron (ZOFRAN) IV  Micro Results Recent Results (from the past 240 hour(s))    Urine Culture     Status: None   Collection Time: 10/23/17  8:53 PM  Result Value Ref Range Status   Specimen Description URINE, CLEAN CATCH  Final   Special Requests NONE  Final   Culture   Final    NO GROWTH Performed at [facility], [place] [address], [place], [place] [address]    Report Status 10/25/2017 FINAL  Final  C difficile quick scan w PCR reflex     Status: None   Collection Time: 10/24/17  4:20 AM  Result Value Ref Range Status   C Diff antigen NEGATIVE NEGATIVE Final   C Diff  toxin NEGATIVE NEGATIVE Final   C Diff interpretation No C. difficile detected.  Final  Gastrointestinal Panel by PCR , Stool     Status: None   Collection Time: 10/24/17  4:20 AM  Result Value Ref Range Status   Campylobacter species NOT DETECTED NOT DETECTED Final   Plesimonas shigelloides NOT DETECTED NOT DETECTED Final   Salmonella species NOT DETECTED NOT DETECTED Final   Yersinia enterocolitica NOT DETECTED NOT DETECTED Final   Vibrio species NOT DETECTED NOT DETECTED Final   Vibrio cholerae NOT DETECTED NOT DETECTED Final   Enteroaggregative E coli (EAEC) NOT DETECTED NOT DETECTED Final   Enteropathogenic E coli (EPEC) NOT DETECTED NOT DETECTED Final   Enterotoxigenic E coli (ETEC) NOT DETECTED NOT DETECTED Final   Shiga like toxin producing E coli (STEC) NOT DETECTED NOT DETECTED Final   Shigella/Enteroinvasive E coli (EIEC) NOT DETECTED NOT DETECTED Final   Cryptosporidium NOT DETECTED NOT DETECTED Final   Cyclospora cayetanensis NOT DETECTED NOT DETECTED Final   Entamoeba histolytica NOT DETECTED NOT DETECTED Final   Giardia lamblia NOT DETECTED NOT DETECTED Final   Adenovirus F40/41 NOT DETECTED NOT DETECTED Final   Astrovirus NOT DETECTED NOT DETECTED Final   Norovirus GI/GII NOT DETECTED NOT DETECTED Final   Rotavirus A NOT DETECTED NOT DETECTED Final   Sapovirus (I, II, IV, and V) NOT DETECTED NOT DETECTED Final    Comment: Performed at [facility], [address], [place], [place] [address]    Radiology Reports Ct Abdomen Pelvis Wo Contrast  Result Date: 10/23/2017 CLINICAL DATA:  Nausea, vomiting, and diarrhea. EXAM: CT ABDOMEN AND PELVIS WITHOUT CONTRAST TECHNIQUE: Multidetector CT imaging of the abdomen and pelvis was performed following the standard protocol without IV contrast. COMPARISON:  None. FINDINGS: Lower chest: No acute abnormality. Hepatobiliary: No focal liver abnormality is seen. No gallstones, gallbladder wall thickening, or biliary dilatation. Pancreas: Unremarkable. No pancreatic ductal dilatation or surrounding inflammatory changes. Spleen: Normal in size without focal abnormality. Adrenals/Urinary Tract: The adrenal glands are unremarkable. Right greater than left renal cortical scarring. There is a 1.7 cm cystic lesion within the midpole of the right kidney, with very thin marginal calcification. There is a 9 mm calculus in the distal right ureter. No right hydronephrosis. There is a 3.7 cm simple cyst in the left kidney. Subcentimeter hyperdense lesion in the left kidney this bone likely a complicated cyst. There is a 7 mm nonobstructing calculus in the upper pole the left kidney. There are two small layering calculi in the bladder. Mild circumferential bladder wall thickening is nonspecific, and may be related to underdistention. Stomach/Bowel: Prominent left-sided colonic diverticulosis with focal short segment wall thickening of the sigmoid colon, with mild surrounding inflammatory changes. The appendix is surgically absent. The stomach and small bowel are unremarkable. No bowel obstruction. Vascular/Lymphatic: Aortic atherosclerosis. Ectasia of the right common iliac artery. No enlarged abdominal or pelvic lymph nodes. Reproductive: The prostate is markedly enlarged, and indents the left posterior bladder wall. Other: No free fluid or pneumoperitoneum. Musculoskeletal: No acute or significant osseous findings. IMPRESSION: 1. Mild sigmoid  diverticulitis.  No perforation or abscess. 2. 9 mm calculus in the mid to distal right ureter, without evidence of hydronephrosis. 3. Additional 7 mm nonobstructing calculus in the upper pole of the left kidney. Two small layering calculi within the bladder. 4. Marked prostatomegaly with mass effect on the left posterior bladder wall. 5.  Aortic atherosclerosis (ICD10-I70.0). Electronically Signed   By: Orville Govern.D.  On: 10/23/2017 18:11    Time Spent in minutes  25   Katty Fretwell M.D on 10/27/2017 at 11:49 AM  Between 7am to 7pm - Pager - [phone]  After 7pm go to [email or web address] - password [facility]  [facility] -  Office  [phone]

## 2017-10-27 NOTE — Progress Notes (Signed)
KHALEEF RUBY  MRN: [id number]  DOB/AGE: 71-Jun-1947 71 y.o.  Primary Care Physician:Pickard, Cammie Mcgee, MD  Admit date: 10/23/2017  Chief Complaint:  Chief Complaint  Patient presents with  . Abnormal Lab    S-Pt presented on  10/23/2017 with  Chief Complaint  Patient presents with  . Abnormal Lab  .    Pt says " I am feeling better".    Pt says " I finished the gallon of the thing, I am looking for getting the scope done today"    Meds . amLODipine  10 mg Oral Daily  . famotidine  20 mg Oral Daily  . feeding supplement  1 Container Oral TID BM  . loratadine  10 mg Oral Daily  . mouth rinse  15 mL Mouth Rinse BID  . metoprolol succinate  25 mg Oral Daily  . predniSONE  40 mg Oral Daily  . sodium bicarbonate  650 mg Oral TID       Physical Exam: Vital signs in last 24 hours: Temp:  [97.4 F (36.3 C)-97.5 F (36.4 C)] 97.5 F (36.4 C) (01/04 0602) Pulse Rate:  [70-85] 70 (01/04 0602) Resp:  [16] 16 (01/04 0602) BP: (96-120)/(65-73) 96/65 (01/04 0602) SpO2:  [99 %-100 %] 99 % (01/04 0602) Weight change:  Last BM Date: 10/26/17  Intake/Output from previous day: 01/03 0701 - 01/04 0700 In: 4137.5 [P.O.:240; I.V.:3397.5; IV Piggyback:500] Out: -  No intake/output data recorded.   Physical Exam: General- pt is awake,alert, oriented to time place and person Resp- No acute REsp distress, CTA B/L NO Rhonchi CVS- S1S2 regular in rate and rhythm GIT- BS+, soft, NT, ND EXT- NO LE Edema, Cyanosis   Lab Results: CBC Recent Labs    10/26/17 0619 10/27/17 0548  WBC 6.5 8.3  HGB 9.0* 8.2*  HCT 28.1* 25.7*  PLT 221 228    BMET Recent Labs    10/26/17 0623 10/27/17 0548  NA 138 138  K 5.6* 4.4  CL 116* 115*  CO2 13* 14*  GLUCOSE 129* 103*  BUN 54* 41*  CREATININE 3.55* 3.00*  CALCIUM 9.1 8.1*   Creat trend 2018  5.7=> 3.98=> 3.55=>3.0 2017  1.7 2016   1.4 2015   1.4 2014   1.3-1.4   MICRO Recent Results (from the past 240 hour(s))   Urine Culture     Status: None   Collection Time: 10/23/17  8:53 PM  Result Value Ref Range Status   Specimen Description URINE, CLEAN CATCH  Final   Special Requests NONE  Final   Culture   Final    NO GROWTH Performed at [facility], [place] [address], [place], [place] [address]    Report Status 10/25/2017 FINAL  Final  C difficile quick scan w PCR reflex     Status: None   Collection Time: 10/24/17  4:20 AM  Result Value Ref Range Status   C Diff antigen NEGATIVE NEGATIVE Final   C Diff toxin NEGATIVE NEGATIVE Final   C Diff interpretation No C. difficile detected.  Final  Gastrointestinal Panel by PCR , Stool     Status: None   Collection Time: 10/24/17  4:20 AM  Result Value Ref Range Status   Campylobacter species NOT DETECTED NOT DETECTED Final   Plesimonas shigelloides NOT DETECTED NOT DETECTED Final   Salmonella species NOT DETECTED NOT DETECTED Final   Yersinia enterocolitica NOT DETECTED NOT DETECTED Final   Vibrio species NOT DETECTED NOT DETECTED Final  Vibrio cholerae NOT DETECTED NOT DETECTED Final   Enteroaggregative E coli (EAEC) NOT DETECTED NOT DETECTED Final   Enteropathogenic E coli (EPEC) NOT DETECTED NOT DETECTED Final   Enterotoxigenic E coli (ETEC) NOT DETECTED NOT DETECTED Final   Shiga like toxin producing E coli (STEC) NOT DETECTED NOT DETECTED Final   Shigella/Enteroinvasive E coli (EIEC) NOT DETECTED NOT DETECTED Final   Cryptosporidium NOT DETECTED NOT DETECTED Final   Cyclospora cayetanensis NOT DETECTED NOT DETECTED Final   Entamoeba histolytica NOT DETECTED NOT DETECTED Final   Giardia lamblia NOT DETECTED NOT DETECTED Final   Adenovirus F40/41 NOT DETECTED NOT DETECTED Final   Astrovirus NOT DETECTED NOT DETECTED Final   Norovirus GI/GII NOT DETECTED NOT DETECTED Final   Rotavirus A NOT DETECTED NOT DETECTED Final   Sapovirus (I, II, IV, and V) NOT DETECTED NOT DETECTED Final    Comment: Performed at [facility], [address] [place], [place] [address]      Lab Results  Component Value Date   PTH 13 (L) 10/24/2017   CALCIUM 8.1 (L) 10/27/2017   PHOS 3.3 10/27/2017               Impression: 1)Renal  AKI secondary to ATN/Prerenal                AKI sec to hypovolemia/NSAIDS                AKi on CKD               CKD stage 3.               CKD since 2014               CKD secondary to NSAIDS/hx of Multiple UTI causing FSGS                Progression of CKD marked with AKI                Proteinura will check.                  AKI better                Creat trending down    2)HTN  Medication- On Calcium Channel Blockers   3)Anemia HGb at goal (9--11)  4)CKD Mineral-Bone Disorder PTH not avail. Secondary Hyperparathyroidism w/u pending . Phosphorus at goal. Calcium is at goal  5)GI- admitted with chronic diarrhea GI and Primary MD following  6)Electrolytes  hyperkalemic   better  NOrmonatremic   7)Acid base Co2 not at goal On po bicarb better   Plan:   Will continue current care. Will follow bmet.    BHUTANI,MANPREET S 10/27/2017, 9:33 AM

## 2017-10-27 NOTE — Progress Notes (Signed)
Patient is here for colonoscopy.  He had no difficulty with prep. Denies abdominal pain. Serum creatinine is down to 3.0. Potassium is 4.4. Pre-sedation evaluation completed. Proceed with colonoscopy.

## 2017-10-27 NOTE — Op Note (Addendum)
[facility] Patient Name: Mike Davila Procedure Date: 10/27/2017 12:25 PM MRN: [id number] Date of Birth: 26-Apr-1946 Attending MD: Hildred Laser , MD CSN: [id number] Age: 72 Admit Type: Outpatient Procedure:                Colonoscopy Indications:              Clinically significant diarrhea of unexplained                            origin, Heme positive stool Providers:                Hildred Laser, MD, Otis Peak B. Sharon Seller, RN, Starla Link RN, RN Referring MD:             Osa Craver, MD Medicines:                Fentanyl 100 micrograms IV, Midazolam 5 mg IV Complications:            No immediate complications. Estimated Blood Loss:     Estimated blood loss was minimal. Procedure:                Pre-Anesthesia Assessment:                           - Prior to the procedure, a History and Physical                            was performed, and patient medications and                            allergies were reviewed. The patient's tolerance of                            previous anesthesia was also reviewed. The risks                            and benefits of the procedure and the sedation                            options and risks were discussed with the patient.                            All questions were answered, and informed consent                            was obtained. Prior Anticoagulants: The patient                            last took ibuprofen 7 days prior to the procedure.                            ASA Grade Assessment: III - A patient with severe  systemic disease. After reviewing the risks and                            benefits, the patient was deemed in satisfactory                            condition to undergo the procedure.                           After obtaining informed consent, the colonoscope                            was passed under direct vision. Throughout the               procedure, the patient's blood pressure, pulse, and                            oxygen saturations were monitored continuously. The                            [id number]-[id number] ([id number]) scope was introduced through                            the anus and advanced to the the cecum, identified                            by appendiceal orifice and ileocecal valve. The                            colonoscopy was somewhat difficult due to a                            tortuous colon. The patient tolerated the procedure                            well. The quality of the bowel preparation was                            adequate. The ileocecal valve, appendiceal orifice,                            and rectum were photographed. Scope In: 2:30:05 PM Scope Out: 2:57:10 PM Total Procedure Duration: 0 hours 27 minutes 5 seconds  Findings:      The perianal and digital rectal examinations were normal.      Multiple small and large-mouthed diverticula were found in the entire       colon.      Normal mucosa was found in the sigmoid colon. Biopsies for histology       were taken with a cold forceps from the sigmoid colon for evaluation of       microscopic colitis.      Internal hemorrhoids were found during retroflexion. The hemorrhoids       were small. Impression:               - Diverticulosis in the  entire examined colon.                           - Normal mucosa in the sigmoid colon. Biopsied.                           - Internal hemorrhoids. Moderate Sedation:      Moderate (conscious) sedation was administered by the endoscopy nurse       and supervised by the endoscopist. The following parameters were       monitored: oxygen saturation, heart rate, blood pressure, CO2       capnography and response to care. Total physician intraservice time was       44 minutes. Recommendation:           - Patient has a contact number available for                            emergencies. The signs and  symptoms of potential                            delayed complications were discussed with the                            patient. Return to normal activities tomorrow.                            Written discharge instructions were provided to the                            patient.                           - Low sodium diet today.                           - Continue present medications.                           - Await pathology results.                           - Repeat colonoscopy in 10 years for screening                            purposes. Procedure Code(s):        --- Professional ---                           [phone], Colonoscopy, flexible; with biopsy, single                            or multiple                           99152, Moderate sedation services provided by the  same physician or other qualified health care                            professional performing the diagnostic or                            therapeutic service that the sedation supports,                            requiring the presence of an independent trained                            observer to assist in the monitoring of the                            patient's level of consciousness and physiological                            status; initial 15 minutes of intraservice time,                            patient age 34 years or older                           [phone], Moderate sedation services; each additional                            15 minutes intraservice time                           99153, Moderate sedation services; each additional                            15 minutes intraservice time Diagnosis Code(s):        --- Professional ---                           K64.8, Other hemorrhoids                           R19.7, Diarrhea, unspecified                           R19.5, Other fecal abnormalities                           K57.30, Diverticulosis of large intestine  without                            perforation or abscess without bleeding CPT copyright 2016 [employer]. All rights reserved. The codes documented in this report are preliminary and upon coder review may  be revised to meet current compliance requirements. Hildred Laser, MD Hildred Laser, MD 10/27/2017 3:09:53 PM This report has been signed electronically. Number of Addenda: 0

## 2017-10-27 NOTE — Progress Notes (Signed)
Brief colonoscopy note.  Colonic diverticulosis. Biopsies taken from mucosa of sigmoid colon. Internal hemorrhoids.

## 2017-10-28 DIAGNOSIS — N170 Acute kidney failure with tubular necrosis: Secondary | ICD-10-CM | POA: Diagnosis present

## 2017-10-28 DIAGNOSIS — R634 Abnormal weight loss: Secondary | ICD-10-CM | POA: Diagnosis present

## 2017-10-28 DIAGNOSIS — E872 Acidosis, unspecified: Secondary | ICD-10-CM | POA: Diagnosis present

## 2017-10-28 DIAGNOSIS — N179 Acute kidney failure, unspecified: Secondary | ICD-10-CM | POA: Diagnosis present

## 2017-10-28 DIAGNOSIS — K5730 Diverticulosis of large intestine without perforation or abscess without bleeding: Secondary | ICD-10-CM | POA: Diagnosis present

## 2017-10-28 DIAGNOSIS — N183 Chronic kidney disease, stage 3 (moderate): Secondary | ICD-10-CM

## 2017-10-28 DIAGNOSIS — N184 Chronic kidney disease, stage 4 (severe): Secondary | ICD-10-CM | POA: Diagnosis present

## 2017-10-28 LAB — RENAL FUNCTION PANEL
ANION GAP: 3 — AB (ref 5–15)
Albumin: 2.9 g/dL — ABNORMAL LOW (ref 3.5–5.0)
BUN: 35 mg/dL — ABNORMAL HIGH (ref 6–20)
CHLORIDE: 120 mmol/L — AB (ref 101–111)
CO2: 20 mmol/L — ABNORMAL LOW (ref 22–32)
CREATININE: 2.84 mg/dL — AB (ref 0.61–1.24)
Calcium: 8 mg/dL — ABNORMAL LOW (ref 8.9–10.3)
GFR, EST AFRICAN AMERICAN: 24 mL/min — AB (ref >60)
GFR, EST NON AFRICAN AMERICAN: 21 mL/min — AB (ref >60)
Glucose, Bld: 84 mg/dL (ref 65–99)
POTASSIUM: 4.5 mmol/L (ref 3.5–5.1)
Phosphorus: 3.4 mg/dL (ref 2.5–4.6)
Sodium: 143 mmol/L (ref 135–145)

## 2017-10-28 LAB — CBC
HCT: 27.1 % — ABNORMAL LOW (ref 39.0–52.0)
HEMOGLOBIN: 8.7 g/dL — AB (ref 13.0–17.0)
MCH: 28.2 pg (ref 26.0–34.0)
MCHC: 32.1 g/dL (ref 30.0–36.0)
MCV: 88 fL (ref 78.0–100.0)
Platelets: 233 10*3/uL (ref 150–400)
RBC: 3.08 MIL/uL — AB (ref 4.22–5.81)
RDW: 15 % (ref 11.5–15.5)
WBC: 5.6 10*3/uL (ref 4.0–10.5)

## 2017-10-28 MED ORDER — LOPERAMIDE HCL 2 MG PO CAPS: 2.0000 mg | ORAL_CAPSULE | ORAL | 0 refills | Status: DC | PRN

## 2017-10-28 MED ORDER — BOOST BREEZE PO LIQD: 1.0000 | Freq: Three times a day (TID) | ORAL | 0 refills | Status: DC

## 2017-10-28 MED ORDER — METOPROLOL SUCCINATE ER 25 MG PO TB24: 25.0000 mg | ORAL_TABLET | Freq: Every day | ORAL | Status: DC

## 2017-10-28 MED ORDER — LOPERAMIDE HCL 2 MG PO CAPS
2.0000 mg | ORAL_CAPSULE | ORAL | Status: DC | PRN
  Administered 2017-10-28: 2 mg via ORAL
  Filled 2017-10-28: qty 1

## 2017-10-28 NOTE — Progress Notes (Signed)
Mike Davila  MRN: [id number]  DOB/AGE: 71-Dec-1947 71 y.o.  Primary Care Physician:Pickard, Cammie Mcgee, MD  Admit date: 10/23/2017  Chief Complaint:  Chief Complaint  Patient presents with  . Abnormal Lab    S-Pt presented on  10/23/2017 with  Chief Complaint  Patient presents with  . Abnormal Lab  .    Pt says " I am feeling so much better".    Pt wife is present in the room .voices no new concerns.    Meds . amLODipine  10 mg Oral Daily  . famotidine  20 mg Oral Daily  . feeding supplement  1 Container Oral TID BM  . loratadine  10 mg Oral Daily  . mouth rinse  15 mL Mouth Rinse BID  . metoprolol succinate  25 mg Oral Daily  . predniSONE  40 mg Oral Daily  . sodium bicarbonate  650 mg Oral TID       Physical Exam: Vital signs in last 24 hours: Temp:  [98 F (36.7 C)-98.3 F (36.8 C)] 98 F (36.7 C) (01/05 0450) Pulse Rate:  [75-98] 75 (01/05 0450) Resp:  [0-31] 14 (01/05 0450) BP: (84-120)/(14-84) 104/70 (01/05 0943) SpO2:  [93 %-99 %] 95 % (01/05 0450) Weight change:  Last BM Date: 10/27/17  Intake/Output from previous day: 01/04 0701 - 01/05 0700 In: 2135.3 [I.V.:1835.3; IV Piggyback:300] Out: -  No intake/output data recorded.   Physical Exam: General- pt is awake,alert, oriented to time place and person Resp- No acute REsp distress, CTA B/L NO Rhonchi CVS- S1S2 regular in rate and rhythm GIT- BS+, soft, NT, ND EXT- trace  LE Edema, Cyanosis   Lab Results: CBC Recent Labs    10/27/17 0548 10/28/17 0548  WBC 8.3 5.6  HGB 8.2* 8.7*  HCT 25.7* 27.1*  PLT 228 233    BMET Recent Labs    10/27/17 0548 10/28/17 0548  NA 138 143  K 4.4 4.5  CL 115* 120*  CO2 14* 20*  GLUCOSE 103* 84  BUN 41* 35*  CREATININE 3.00* 2.84*  CALCIUM 8.1* 8.0*   Creat trend 2018  5.7=> 3.98=> 3.55=>3.0=>2.8 2017  1.7 2016   1.4 2015   1.4 2014   1.3-1.4   MICRO Recent Results (from the past 240 hour(s))  Urine Culture     Status: None    Collection Time: 10/23/17  8:53 PM  Result Value Ref Range Status   Specimen Description URINE, CLEAN CATCH  Final   Special Requests NONE  Final   Culture   Final    NO GROWTH Performed at [facility], [place] [address], [place], [place] [address]    Report Status 10/25/2017 FINAL  Final  C difficile quick scan w PCR reflex     Status: None   Collection Time: 10/24/17  4:20 AM  Result Value Ref Range Status   C Diff antigen NEGATIVE NEGATIVE Final   C Diff toxin NEGATIVE NEGATIVE Final   C Diff interpretation No C. difficile detected.  Final  Gastrointestinal Panel by PCR , Stool     Status: None   Collection Time: 10/24/17  4:20 AM  Result Value Ref Range Status   Campylobacter species NOT DETECTED NOT DETECTED Final   Plesimonas shigelloides NOT DETECTED NOT DETECTED Final   Salmonella species NOT DETECTED NOT DETECTED Final   Yersinia enterocolitica NOT DETECTED NOT DETECTED Final   Vibrio species NOT DETECTED NOT DETECTED Final   Vibrio cholerae NOT DETECTED NOT DETECTED  Final   Enteroaggregative E coli (EAEC) NOT DETECTED NOT DETECTED Final   Enteropathogenic E coli (EPEC) NOT DETECTED NOT DETECTED Final   Enterotoxigenic E coli (ETEC) NOT DETECTED NOT DETECTED Final   Shiga like toxin producing E coli (STEC) NOT DETECTED NOT DETECTED Final   Shigella/Enteroinvasive E coli (EIEC) NOT DETECTED NOT DETECTED Final   Cryptosporidium NOT DETECTED NOT DETECTED Final   Cyclospora cayetanensis NOT DETECTED NOT DETECTED Final   Entamoeba histolytica NOT DETECTED NOT DETECTED Final   Giardia lamblia NOT DETECTED NOT DETECTED Final   Adenovirus F40/41 NOT DETECTED NOT DETECTED Final   Astrovirus NOT DETECTED NOT DETECTED Final   Norovirus GI/GII NOT DETECTED NOT DETECTED Final   Rotavirus A NOT DETECTED NOT DETECTED Final   Sapovirus (I, II, IV, and V) NOT DETECTED NOT DETECTED Final    Comment: Performed at [facility], [address] [place], [place] [address]       Lab Results  Component Value Date   PTH 13 (L) 10/24/2017   CALCIUM 8.0 (L) 10/28/2017   PHOS 3.4 10/28/2017               Impression: 1)Renal  AKI secondary to ATN/Prerenal                AKI sec to hypovolemia/NSAIDS                AKi on CKD               CKD stage 3.               CKD since 2014               CKD secondary to NSAIDS/hx of Multiple UTI causing FSGS                Progression of CKD marked with AKI                Proteinura will check.                  AKI better                Creat trending down    2)HTN  Medication- On Calcium Channel Blockers   3)Anemia HGb at goal (9--11)  4)CKD Mineral-Bone Disorder PTH not avail. Secondary Hyperparathyroidism w/u pending . Phosphorus at goal. Calcium is at goal  5)GI- admitted with chronic diarrhea GI and Primary MD following  6)Electrolytes  hyperkalemic   better  NOrmonatremic   7)Acid base Co2 not at goal On po bicarb Much better   Plan:   Will continue current care.  If pt is dced, pt will benefit from nephrology follow up as outpt.     Liela Rylee S 10/28/2017, 10:54 AM

## 2017-10-28 NOTE — Progress Notes (Signed)
Discharge instructions given on medications,and follow up visits,patient verbalized understanding. Prescriptions sent with patient.IV discontinued,catheter intact. Vital signs stable. Accompanied by staff to an awaiting vehicle.

## 2017-10-28 NOTE — Discharge Summary (Signed)
Physician Discharge Summary  Mike Davila NFA:[id number] DOB: February 01, 1946 DOA: 10/23/2017  PCP: [name], MD  Admit date: 10/23/2017 Discharge date: 10/28/2017  Admitted From: Home Disposition:  Home  Recommendations for Outpatient Follow-up:  1. Follow up with PCP in 1 week. Needs renal function panel checked during outpatient visit. 2. Follow-up with GI IN 3-4 weeks  3. Follow-up with nephrologistTruddie Crumble)  in 2 weeks  Home Health: None Equipment/Devices: None  Discharge Condition: Fair CODE STATUS: Full code Diet recommendation: renal    Discharge Diagnoses:  Principal Problem:   Acute renal failure with acute tubular necrosis superimposed on stage 3 chronic kidney disease (HCC)   Active Problems:     Diverticulosis of colon   Weight loss, unintentional   Metabolic acidosis with normal anion gap and bicarbonate losses   Hypertension   Hypertriglyceridemia   Hyperlipidemia   Anemia   Hyperkalemia   Diverticulitis large intestine   Hypercalcemia   Protein-calorie malnutrition, severe   Brief narrative/history of present illness Please refer to admission H&P for details, in brief,72 year old male with hypertension, hyperlipidemia, remote tobacco use presented with 6-8 weeks of persistent diarrhea and almost 30 pound weight loss without associated abdominal pain, melena or hematochezia. He went to PCP on the day of admission where blood work was done which showed acute kidney injury and sent to the ED. Patient admitted for further workup and GI and nephrology consulted.  Principal Problem: Acute kidney injury on chronic kidney disease stage III ([name]) Secondary to acute tubular necrosis with dehydration and NSAID use. 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 Complements , ANA and ANCA negative. Hepatitis panel negative. Creatinine slowly improving (2.84 today). Instructed on hydrating himself well. Strongly advised on avoiding NSAIDs.  Follow-up with nephrology as outpatient.  Active Problems: Diarrhea with significant weight loss. Stool for C. difficile and GI pathogen panel negative. Celiac antibody negative..  CT abdomen suspicious for diverticulitis, however has no abdominal symptoms besides diarrhea.  Received empiric ciprofloxacin and Flagyl which will be discontinued. Colonoscopy done showed colonic diverticulosis without inflammation or mass. Biopsies taken from sigmoid colon mucosa. Also showed nonbleeding internal hemorrhoids. Follow-up with biopsy results as outpatient. Had few episodes of diarrhea today but otherwise much improved clinically.   Anemia, unspecified Hemoccult positive. Iron panel unremarkable. Monitor as outpatient and follow colonoscopy biopsy results.  Hyperkalemia reportedly drinking orange juice and was placed on low potassium diet. Improved with Kayexalate.   Normal anion gap metabolic acidosis Possibly due to ongoing diarrhea and bicarbonate loss. Now resolved with supplements.  Acute gouty arthritis of right  knee Received 40 of oral prednisone and symptoms resolved.  Essential hypertension Blood pressure low normal. I will discharge him only on beta blocker. Will discontinue his ACE inhibitor and amlodipine.  Hypercalcemia Likely associated with acute kidney injury. Improved with fluids. PTH low.  Prediabetes Stable CBG.  Hyperlipidemia  Resume statin   Family Communication  : Wife at bedside  Disposition Plan  :  home   Consults  :   GI Nephrology   Procedures  :  CT abdomen and pelvis  colonoscopy    Discharge Instructions   Allergies as of 10/28/2017      Reactions   Crestor [rosuvastatin Calcium]    Lipitor [atorvastatin Calcium]    Sulfa Antibiotics    Tetanus Toxoids       Medication List    STOP taking these medications   amLODipine 10 MG tablet Commonly known  as:  NORVASC   benazepril 20 MG tablet Commonly known as:   LOTENSIN   ibuprofen 200 MG tablet Commonly known as:  ADVIL,MOTRIN     TAKE these medications   feeding supplement (BOOST BREEZE) Liqd Take 1 Container by mouth 3 (three) times daily with meals.   loperamide 2 MG capsule Commonly known as:  IMODIUM Take 1 capsule (2 mg total) by mouth as needed for diarrhea or loose stools.   metoprolol succinate 25 MG 24 hr tablet Commonly known as:  TOPROL-XL TAKE ONE TABLET BY MOUTH DAILY.   promethazine 25 MG tablet Commonly known as:  PHENERGAN Take 1 tablet (25 mg total) by mouth every 6 (six) hours as needed for nausea or vomiting.   sildenafil 100 MG tablet Commonly known as:  VIAGRA Take 1 tablet (100 mg total) by mouth daily as needed.      Follow-up Information    [name], MD. Schedule an appointment as soon as possible for a visit in 1 week(s).   Specialty:  Family Medicine Contact information: [address] [place] [address] [phone]        Rogene Houston, MD Follow up in 3 week(s).   Specialty:  Gastroenterology Why:  office will call Contact information: [place], [address] [place] [address] [phone]        Fran Lowes, MD. Schedule an appointment as soon as possible for a visit in 2 week(s).   Specialty:  Nephrology Contact information: [address] [place] [address] [phone]          Allergies  Allergen Reactions  . Crestor [Rosuvastatin Calcium]   . Lipitor [Atorvastatin Calcium]   . Sulfa Antibiotics   . Tetanus Toxoids       Procedures/Studies: Ct Abdomen Pelvis Wo Contrast  Result Date: 10/23/2017 CLINICAL DATA:  Nausea, vomiting, and diarrhea. EXAM: CT ABDOMEN AND PELVIS WITHOUT CONTRAST TECHNIQUE: Multidetector CT imaging of the abdomen and pelvis was performed following the standard protocol without IV contrast. COMPARISON:  None. FINDINGS: Lower chest: No acute abnormality. Hepatobiliary: No focal liver abnormality is seen.  No gallstones, gallbladder wall thickening, or biliary dilatation. Pancreas: Unremarkable. No pancreatic ductal dilatation or surrounding inflammatory changes. Spleen: Normal in size without focal abnormality. Adrenals/Urinary Tract: The adrenal glands are unremarkable. Right greater than left renal cortical scarring. There is a 1.7 cm cystic lesion within the midpole of the right kidney, with very thin marginal calcification. There is a 9 mm calculus in the distal right ureter. No right hydronephrosis. There is a 3.7 cm simple cyst in the left kidney. Subcentimeter hyperdense lesion in the left kidney this bone likely a complicated cyst. There is a 7 mm nonobstructing calculus in the upper pole the left kidney. There are two small layering calculi in the bladder. Mild circumferential bladder wall thickening is nonspecific, and may be related to underdistention. Stomach/Bowel: Prominent left-sided colonic diverticulosis with focal short segment wall thickening of the sigmoid colon, with mild surrounding inflammatory changes. The appendix is surgically absent. The stomach and small bowel are unremarkable. No bowel obstruction. Vascular/Lymphatic: Aortic atherosclerosis. Ectasia of the right common iliac artery. No enlarged abdominal or pelvic lymph nodes. Reproductive: The prostate is markedly enlarged, and indents the left posterior bladder wall. Other: No free fluid or pneumoperitoneum. Musculoskeletal: No acute or significant osseous findings. IMPRESSION: 1. Mild sigmoid diverticulitis.  No perforation or abscess. 2. 9 mm calculus in the mid to distal right ureter, without  evidence of hydronephrosis. 3. Additional 7 mm nonobstructing calculus in the upper pole of the left kidney. Two small layering calculi within the bladder. 4. Marked prostatomegaly with mass effect on the left posterior bladder wall. 5.  Aortic atherosclerosis (ICD10-I70.0). Electronically Signed   By: Titus Dubin M.D.   On: 10/23/2017 18:11     Colonoscopy Multiple colonic diverticula. Biopsies taken from sigmoid colon to evaluate for microscopic colitis. Nonbleeding internal hemorrhoids.  Subjective: Reports 3-4 episodes of diarrhea last night. Feels much better this morning.  Discharge Exam: Vitals:   10/28/17 0450 10/28/17 0943  BP: 96/61 104/70  Pulse: 75   Resp: 14   Temp: 98 F (36.7 C)   SpO2: 95%    Vitals:   10/27/17 1510 10/27/17 2241 10/28/17 0450 10/28/17 0943  BP: 91/72 (!) 98/58 96/61 104/70  Pulse: 85 80 75   Resp: 17 15 14    Temp:  98 F (36.7 C) 98 F (36.7 C)   TempSrc:  Oral Oral   SpO2: 98% 96% 95%   Weight:      Height:        Gen.: Elderly male not in distress HEENT: Pallor present, moist mucosa  chest: Clear bilaterally  CVS: Normal S1 and S2, no murmurs GI: Soft, nondistended, nontender Musculoskeletal: Warm, no edema, right knee swelling and tenderness resolved       The results of significant diagnostics from this hospitalization (including imaging, microbiology, ancillary and laboratory) are listed below for reference.     Microbiology: Recent Results (from the past 240 hour(s))  Urine Culture     Status: None   Collection Time: 10/23/17  8:53 PM  Result Value Ref Range Status   Specimen Description URINE, CLEAN CATCH  Final   Special Requests NONE  Final   Culture   Final    NO GROWTH Performed at [facility], [address], [place], [place] [address]    Report Status 10/25/2017 FINAL  Final  C difficile quick scan w PCR reflex     Status: None   Collection Time: 10/24/17  4:20 AM  Result Value Ref Range Status   C Diff antigen NEGATIVE NEGATIVE Final   C Diff toxin NEGATIVE NEGATIVE Final   C Diff interpretation No C. difficile detected.  Final  Gastrointestinal Panel by PCR , Stool     Status: None   Collection Time: 10/24/17  4:20 AM  Result Value Ref Range Status   Campylobacter species NOT DETECTED NOT DETECTED Final   Plesimonas shigelloides  NOT DETECTED NOT DETECTED Final   Salmonella species NOT DETECTED NOT DETECTED Final   Yersinia enterocolitica NOT DETECTED NOT DETECTED Final   Vibrio species NOT DETECTED NOT DETECTED Final   Vibrio cholerae NOT DETECTED NOT DETECTED Final   Enteroaggregative E coli (EAEC) NOT DETECTED NOT DETECTED Final   Enteropathogenic E coli (EPEC) NOT DETECTED NOT DETECTED Final   Enterotoxigenic E coli (ETEC) NOT DETECTED NOT DETECTED Final   Shiga like toxin producing E coli (STEC) NOT DETECTED NOT DETECTED Final   Shigella/Enteroinvasive E coli (EIEC) NOT DETECTED NOT DETECTED Final   Cryptosporidium NOT DETECTED NOT DETECTED Final   Cyclospora cayetanensis NOT DETECTED NOT DETECTED Final   Entamoeba histolytica NOT DETECTED NOT DETECTED Final   Giardia lamblia NOT DETECTED NOT DETECTED Final   Adenovirus F40/41 NOT DETECTED NOT DETECTED Final   Astrovirus NOT DETECTED NOT DETECTED Final   Norovirus GI/GII NOT DETECTED NOT DETECTED Final   Rotavirus A  NOT DETECTED NOT DETECTED Final   Sapovirus (I, II, IV, and V) NOT DETECTED NOT DETECTED Final    Comment: Performed at [facility], [address] [place], [place] [address]     Labs: BNP (last 3 results) No results for input(s): BNP in the last 8760 hours. Basic Metabolic Panel: Recent Labs  Lab 10/23/17 1947  10/25/17 0616 10/26/17 0619 10/26/17 0623 10/27/17 0548 10/28/17 0548  NA 137   < > 138 138 138 138 143  K 5.4*   < > 5.2* 5.6* 5.6* 4.4 4.5  CL 109   < > 115* 116* 116* 115* 120*  CO2 17*   < > 15* 12* 13* 14* 20*  GLUCOSE 116*   < > 85 127* 129* 103* 84  BUN 97*   < > 70* 56* 54* 41* 35*  CREATININE 4.75*   < > 3.98* 3.55* 3.55* 3.00* 2.84*  CALCIUM 11.6*   < > 9.6 9.2 9.1 8.1* 8.0*  PHOS 5.2*  --  3.1  --  3.3 3.3 3.4   < > = values in this interval not displayed.   Liver Function Tests: Recent Labs  Lab 10/23/17 1208 10/23/17 1947 10/25/17 0616 10/26/17 0623 10/27/17 0548 10/28/17 0548  AST 4* 9*   --   --   --   --   ALT 8* 12*  --   --   --   --   ALKPHOS  --  64  --   --   --   --   BILITOT 0.3 0.6  --   --   --   --   PROT 7.1 7.0  --   --   --   --   ALBUMIN  --  3.7 3.4* 3.4* 3.1* 2.9*   Recent Labs  Lab 10/23/17 1208  LIPASE 52  AMYLASE 82   No results for input(s): AMMONIA in the last 168 hours. CBC: Recent Labs  Lab 10/23/17 1947 10/24/17 0542 10/25/17 0612 10/26/17 0619 10/27/17 0548 10/28/17 0548  WBC 9.0 7.4 7.3 6.5 8.3 5.6  NEUTROABS 6.9  --   --   --   --   --   HGB 10.5* 8.9* 9.1* 9.0* 8.2* 8.7*  HCT 31.1* 27.4* 28.1* 28.1* 25.7* 27.1*  MCV 85.2 86.4 87.8 87.3 87.4 88.0  PLT 236 214 229 221 228 233   Cardiac Enzymes: Recent Labs  Lab 10/23/17 2158  CKTOTAL 29*   BNP: Invalid input(s): POCBNP CBG: No results for input(s): GLUCAP in the last 168 hours. D-Dimer No results for input(s): DDIMER in the last 72 hours. Hgb A1c No results for input(s): HGBA1C in the last 72 hours. Lipid Profile No results for input(s): CHOL, HDL, LDLCALC, TRIG, CHOLHDL, LDLDIRECT in the last 72 hours. Thyroid function studies No results for input(s): TSH, T4TOTAL, T3FREE, THYROIDAB in the last 72 hours.  Invalid input(s): FREET3 Anemia work up No results for input(s): VITAMINB12, FOLATE, FERRITIN, TIBC, IRON, RETICCTPCT in the last 72 hours. Urinalysis    Component Value Date/Time   COLORURINE STRAW (A) 10/23/2017 2053   APPEARANCEUR CLEAR 10/23/2017 2053   LABSPEC 1.011 10/23/2017 2053   PHURINE 5.0 10/23/2017 2053   GLUCOSEU NEGATIVE 10/23/2017 2053   HGBUR SMALL (A) 10/23/2017 2053   BILIRUBINUR NEGATIVE 10/23/2017 2053   [place] 10/23/2017 2053   PROTEINUR NEGATIVE 10/23/2017 2053   NITRITE NEGATIVE 10/23/2017 2053   LEUKOCYTESUR TRACE (A) 10/23/2017 2053   Sepsis Labs Invalid input(s): PROCALCITONIN,  WBC,  LACTICIDVEN Microbiology Recent Results (from the past 240 hour(s))  Urine Culture     Status: None   Collection Time: 10/23/17   8:53 PM  Result Value Ref Range Status   Specimen Description URINE, CLEAN CATCH  Final   Special Requests NONE  Final   Culture   Final    NO GROWTH Performed at [facility], [address] [place], [place] [address]    Report Status 10/25/2017 FINAL  Final  C difficile quick scan w PCR reflex     Status: None   Collection Time: 10/24/17  4:20 AM  Result Value Ref Range Status   C Diff antigen NEGATIVE NEGATIVE Final   C Diff toxin NEGATIVE NEGATIVE Final   C Diff interpretation No C. difficile detected.  Final  Gastrointestinal Panel by PCR , Stool     Status: None   Collection Time: 10/24/17  4:20 AM  Result Value Ref Range Status   Campylobacter species NOT DETECTED NOT DETECTED Final   Plesimonas shigelloides NOT DETECTED NOT DETECTED Final   Salmonella species NOT DETECTED NOT DETECTED Final   Yersinia enterocolitica NOT DETECTED NOT DETECTED Final   Vibrio species NOT DETECTED NOT DETECTED Final   Vibrio cholerae NOT DETECTED NOT DETECTED Final   Enteroaggregative E coli (EAEC) NOT DETECTED NOT DETECTED Final   Enteropathogenic E coli (EPEC) NOT DETECTED NOT DETECTED Final   Enterotoxigenic E coli (ETEC) NOT DETECTED NOT DETECTED Final   Shiga like toxin producing E coli (STEC) NOT DETECTED NOT DETECTED Final   Shigella/Enteroinvasive E coli (EIEC) NOT DETECTED NOT DETECTED Final   Cryptosporidium NOT DETECTED NOT DETECTED Final   Cyclospora cayetanensis NOT DETECTED NOT DETECTED Final   Entamoeba histolytica NOT DETECTED NOT DETECTED Final   Giardia lamblia NOT DETECTED NOT DETECTED Final   Adenovirus F40/41 NOT DETECTED NOT DETECTED Final   Astrovirus NOT DETECTED NOT DETECTED Final   Norovirus GI/GII NOT DETECTED NOT DETECTED Final   Rotavirus A NOT DETECTED NOT DETECTED Final   Sapovirus (I, II, IV, and V) NOT DETECTED NOT DETECTED Final    Comment: Performed at [facility], [address] [place],  [address]     Time coordinating  discharge: Over 30 minutes  SIGNED:   Louellen Molder, MD  [facility] 10/28/2017, 1:32 PM Pager   If 7PM-7AM, please contact night-coverage [email or web address] Password [email or web address]

## 2017-10-28 NOTE — Discharge Instructions (Signed)

## 2017-10-28 NOTE — Progress Notes (Signed)
Patient ID: Mike Davila, male   DOB: 1946-09-19, 72 y.o.   MRN: [id number]   Assessment/Plan: ADMITTED WITH ANEMIA & DIARRHEA.CLINICALLY IMPROVED.   PLAN:  1. SUPPORTIVE CARE 2. OPV JAN 16 W/ DR. Laural Golden  Subjective: Since last evaluated the patient PT HAD DIARRHEA FROM 2A-6A, BUT FEELING BETTER AND WANTS TO [place].   Objective: Vital signs in last 24 hours: Vitals:   10/27/17 2241 10/28/17 0450  BP: (!) 98/58 96/61  Pulse: 80 75  Resp: 15 14  Temp: 98 F (36.7 C) 98 F (36.7 C)  SpO2: 96% 95%   General appearance: alert Resp: clear to auscultation bilaterally Cardio: regular rate and rhythm GI: soft, non-tender; bowel sounds normal;  Lab Results:  K 4.4 Cr 2.8 Hb 8.7 PLT 228   Studies/Results: No results found.  Medications: I have reviewed the patient's current medications.   LOS: 5 days

## 2017-10-30 ENCOUNTER — Other Ambulatory Visit: Payer: Self-pay

## 2017-10-30 ENCOUNTER — Emergency Department (HOSPITAL_COMMUNITY)
Admission: EM | Admit: 2017-10-30 | Discharge: 2017-10-30 | Disposition: A | Discharge status: Home / Self Care | Payer: Medicare Other | Attending: Emergency Medicine | Admitting: Emergency Medicine

## 2017-10-30 ENCOUNTER — Encounter (HOSPITAL_COMMUNITY): Payer: Self-pay | Admitting: *Deleted

## 2017-10-30 DIAGNOSIS — I10 Essential (primary) hypertension: Secondary | ICD-10-CM | POA: Diagnosis not present

## 2017-10-30 DIAGNOSIS — R339 Retention of urine, unspecified: Secondary | ICD-10-CM | POA: Diagnosis not present

## 2017-10-30 DIAGNOSIS — Z87891 Personal history of nicotine dependence: Secondary | ICD-10-CM | POA: Insufficient documentation

## 2017-10-30 DIAGNOSIS — Z79899 Other long term (current) drug therapy: Secondary | ICD-10-CM | POA: Diagnosis not present

## 2017-10-30 LAB — URINALYSIS, ROUTINE W REFLEX MICROSCOPIC
BILIRUBIN URINE: NEGATIVE
Bacteria, UA: NONE SEEN
Glucose, UA: NEGATIVE mg/dL
KETONES UR: NEGATIVE mg/dL
LEUKOCYTES UA: NEGATIVE
Nitrite: NEGATIVE
PH: 6 (ref 5.0–8.0)
PROTEIN: NEGATIVE mg/dL
Specific Gravity, Urine: 1.01 (ref 1.005–1.030)
Squamous Epithelial / LPF: NONE SEEN

## 2017-10-30 NOTE — ED Provider Notes (Signed)
[facility] EMERGENCY DEPARTMENT Provider Note   CSN: [id number] Arrival date & time: 10/30/17  0439     History   Chief Complaint Chief Complaint  Patient presents with  . Urinary Retention    HPI Mike Davila is a 72 y.o. male.  The history is provided by the patient.  He has history of hyperglycemia, hyperlipidemia, hypertension, diverticulitis and was recently hospitalized with acute kidney injury and discharged 2 days ago.  Last night, he noted inability to urinate.  He does have history of enlarged prostate, but has not received treatment for it.  He denies fever or chills.  He denies nausea or vomiting.  Past Medical History:  Diagnosis Date  . Hematoma of leg    left leg  . Hernia    2  . History of tobacco use   . Hyperglycemia   . Hyperlipidemia   . Hypertension   . Palpitations     Patient Active Problem List   Diagnosis Date Noted  . Acute renal failure with acute tubular necrosis superimposed on stage 3 chronic kidney disease ([place]) 10/28/2017  . Diverticulosis of colon 10/28/2017  . Weight loss, unintentional 10/28/2017  . Metabolic acidosis with normal anion gap and bicarbonate losses 10/28/2017  . Protein-calorie malnutrition, severe 10/26/2017  . Diverticulitis large intestine 10/24/2017  . Hypercalcemia 10/24/2017  . Anemia 10/23/2017  . Hyperkalemia 10/23/2017  . Screening for prostate cancer 12/23/2014  . Hyperlipidemia   . Hypertension   . Hyperglycemia   . Hypertriglyceridemia     Past Surgical History:  Procedure Laterality Date  . ANKLE SURGERY     4 or 5 ankle surgeries.  . APPENDECTOMY    . HERNIA REPAIR    . PLANTAR FASCIA SURGERY    . TONSILLECTOMY         Home Medications    Prior to Admission medications   Medication Sig Start Date End Date Taking? Authorizing Provider  loperamide (IMODIUM) 2 MG capsule Take 1 capsule (2 mg total) by mouth as needed for diarrhea or loose stools. 10/28/17   Dhungel, Nishant, MD    metoprolol succinate (TOPROL-XL) 25 MG 24 hr tablet TAKE ONE TABLET BY MOUTH DAILY. 09/13/17   Orlena Sheldon, PA-C  Nutritional Supplements (FEEDING SUPPLEMENT, BOOST BREEZE,) LIQD Take 1 Container by mouth 3 (three) times daily with meals. 10/28/17   Dhungel, Flonnie Overman, MD  promethazine (PHENERGAN) 25 MG tablet Take 1 tablet (25 mg total) by mouth every 6 (six) hours as needed for nausea or vomiting. 10/23/17   Orlena Sheldon, PA-C  sildenafil (VIAGRA) 100 MG tablet Take 1 tablet (100 mg total) by mouth daily as needed. 06/15/16   Orlena Sheldon, PA-C    Family History Family History  Problem Relation Age of Onset  . Thyroid cancer Mother   . Heart disease Father        MI  . Colon cancer Father   . Melanoma Father   . Benign prostatic hyperplasia Brother   . Diabetes Mellitus II Maternal Grandmother   . Ovarian cancer Daughter     Social History Social History   Tobacco Use  . Smoking status: Former Research scientist (life sciences)  . Smokeless tobacco: Current User    Types: Chew  Substance Use Topics  . Alcohol use: No  . Drug use: No     Allergies   Crestor [rosuvastatin calcium]; Lipitor [atorvastatin calcium]; Sulfa antibiotics; and Tetanus toxoids   Review of Systems Review of Systems  All other systems  reviewed and are negative.    Physical Exam Updated Vital Signs BP 137/86 (BP Location: Left Arm)   Pulse 85   Temp 98.6 F (37 C) (Oral)   Resp 16   Ht 5\' 8"  (1.727 m)   Wt 80.7 kg (178 lb)   SpO2 99%   BMI 27.06 kg/m   Physical Exam  Nursing note and vitals reviewed.  72 year old male, resting comfortably and in no acute distress. Vital signs are normal. Oxygen saturation is 99%, which is normal. Head is normocephalic and atraumatic. PERRLA, EOMI. Oropharynx is clear. Neck is nontender and supple without adenopathy or JVD. Back is nontender and there is no CVA tenderness. Lungs are clear without rales, wheezes, or rhonchi. Chest is nontender. Heart has regular rate and  rhythm without murmur. Abdomen is soft, flat, nontender without masses or hepatosplenomegaly and peristalsis is normoactive.  Bladder was not distended, but I evaluated him after Foley catheter had been placed. Extremities have 2+ edema, full range of motion is present. Skin is warm and dry without rash. Neurologic: Mental status is normal, cranial nerves are intact, there are no motor or sensory deficits.  ED Treatments / Results   Procedures Procedures (including critical care time)  Medications Ordered in ED Medications - No data to display   Initial Impression / Assessment and Plan / ED Course  I have reviewed the triage vital signs and the nursing notes.  Acute urinary retention.  Foley catheter was placed yielding 1200 mL of urine.  Old records are reviewed confirming recent hospitalization for acute kidney injury.  He is discharged with Foley catheter in place and is referred to urology for follow-up.  Final Clinical Impressions(s) / ED Diagnoses   Final diagnoses:  Urinary retention    ED Discharge Orders    None       Delora Fuel, MD 47/[birth year]/62 [phone]

## 2017-10-30 NOTE — ED Triage Notes (Signed)
Pt c/o not being able to urinate since Sunday morning around 1 am; pt was recently in hospital for kidney failure

## 2017-10-31 ENCOUNTER — Telehealth: Payer: Self-pay

## 2017-10-31 ENCOUNTER — Encounter (HOSPITAL_COMMUNITY): Payer: Self-pay | Admitting: Internal Medicine

## 2017-10-31 NOTE — Telephone Encounter (Signed)
Patient called to inform PCP that he had a catheter put in from where he was having trouble urinating.Patient was told to follow up with PCP after being discharged from hospital. Patient called to say he does not want t to go out in public with the catheter in and that PCP could call him to discuss his health if need be

## 2017-11-01 NOTE — Telephone Encounter (Signed)
I have been reading his hospital notes in the computer. They are sent to me on the computer as they are performed daily. He does not have to come here for OV. F/U with the specialists as directed at hospital.

## 2017-11-06 DIAGNOSIS — N202 Calculus of kidney with calculus of ureter: Secondary | ICD-10-CM | POA: Diagnosis not present

## 2017-11-06 DIAGNOSIS — N401 Enlarged prostate with lower urinary tract symptoms: Secondary | ICD-10-CM | POA: Diagnosis not present

## 2017-11-06 DIAGNOSIS — R351 Nocturia: Secondary | ICD-10-CM | POA: Diagnosis not present

## 2017-11-06 DIAGNOSIS — R338 Other retention of urine: Secondary | ICD-10-CM | POA: Diagnosis not present

## 2017-11-08 ENCOUNTER — Ambulatory Visit (INDEPENDENT_AMBULATORY_CARE_PROVIDER_SITE_OTHER): Payer: Medicare Other | Admitting: Internal Medicine

## 2017-11-08 ENCOUNTER — Encounter (INDEPENDENT_AMBULATORY_CARE_PROVIDER_SITE_OTHER): Payer: Self-pay | Admitting: Internal Medicine

## 2017-11-08 VITALS — BP 120/80 | HR 60 | Temp 97.9°F | Ht 66.0 in | Wt 177.6 lb

## 2017-11-08 DIAGNOSIS — R634 Abnormal weight loss: Secondary | ICD-10-CM

## 2017-11-08 NOTE — Patient Instructions (Signed)
CBC and CMET.  OV in 3 months.

## 2017-11-08 NOTE — Progress Notes (Signed)
Subjective:    Patient ID: Mike Davila, male    DOB: 07-05-1946, 72 y.o.   MRN: [id number]  HPI Referred by Dena Billet PA-C for weight loss, weakness. He recently underwent a colonoscopy by Dr. Laural Golden for heme positive stool, diarrhea.  He tells me today he is doing good. He feels like he is getting his strength back. He is eating good. He is eating 3 meals a day. Having a BM daily. Stools are firm. No melena or BRRB. Wegiht 10/30/2016 was 178. Today his wt is 177.6.  He states his normal wt was 202. In November. He says he had diarrhea during that time and was afraid to eat.  He is seeing Dr. Hinda Lenis on 11/21/2017 for his kidneys.   He states from August till December, he lost the weight. 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 Patient states this caused him a lot of stress and the weight loss and diarrhea started.   10/27/2017 Colonoscopy Dr. Rehman:heme positive stool, diarrhea.   Impression:               - Diverticulosis in the entire examined colon.                           - Normal mucosa in the sigmoid colon. Biopsied.                           - Internal hemorrhoids.  Sigmoid colon biopsy negative for microscopic colitis..  11/02/2017 Ferritin 335, Iron 59, TIBC 241, Sat 25, UIBC 182.  GI pathogen negative.   10/23/2017 CT abdomen/pelvis with CM: N,V,diarrhea.  IMPRESSION: 1. Mild sigmoid diverticulitis.  No perforation or abscess. 2. 9 mm calculus in the mid to distal right ureter, without evidence of hydronephrosis. 3. Additional 7 mm nonobstructing calculus in the upper pole of the left kidney. Two small layering calculi within the bladder. 4. Marked prostatomegaly with mass effect on the left posterior bladder wall. 5.  Aortic atherosclerosis (ICD10-I70.0).  CMP Latest Ref Rng & Units 10/28/2017 10/27/2017 10/26/2017  Glucose 65 - 99 mg/dL 84 103(H) 129(H)  BUN 6 - 20 mg/dL 35(H) 41(H) 54(H)    Creatinine 0.61 - 1.24 mg/dL 2.84(H) 3.00(H) 3.55(H)  Sodium 135 - 145 mmol/L 143 138 138  Potassium 3.5 - 5.1 mmol/L 4.5 4.4 5.6(H)  Chloride 101 - 111 mmol/L 120(H) 115(H) 116(H)  CO2 22 - 32 mmol/L 20(L) 14(L) 13(L)  Calcium 8.9 - 10.3 mg/dL 8.0(L) 8.1(L) 9.1  Total Protein 6.5 - 8.1 g/dL - - -  Total Bilirubin 0.3 - 1.2 mg/dL - - -  Alkaline Phos 38 - 126 U/L - - -  AST 15 - 41 U/L - - -  ALT 17 - 63 U/L - - -   CBC    Component Value Date/Time   WBC 5.6 10/28/2017 0548   RBC 3.08 (L) 10/28/2017 0548   HGB 8.7 (L) 10/28/2017 0548   HCT 27.1 (L) 10/28/2017 0548   PLT 233 10/28/2017 0548   MCV 88.0 10/28/2017 0548   MCH 28.2 10/28/2017 0548   MCHC 32.1 10/28/2017 0548   RDW 15.0 10/28/2017 0548   LYMPHSABS 1.3 10/23/2017 1947   MONOABS 0.7 10/23/2017 1947   EOSABS 0.2 10/23/2017 1947   BASOSABS 0.0 10/23/2017 1947  Review of Systems Past Medical History:  Diagnosis Date  . Hematoma of leg    left leg  . Hernia    2  . History of tobacco use   . Hyperglycemia   . Hyperlipidemia   . Hypertension   . Palpitations     Past Surgical History:  Procedure Laterality Date  . ANKLE SURGERY     4 or 5 ankle surgeries.  . APPENDECTOMY    . COLONOSCOPY N/A 10/27/2017   Procedure: COLONOSCOPY;  Surgeon: Rogene Houston, MD;  Location: [facility];  Service: Endoscopy;  Laterality: N/A;  . HERNIA REPAIR    . PLANTAR FASCIA SURGERY    . TONSILLECTOMY      Allergies  Allergen Reactions  . Crestor [Rosuvastatin Calcium]   . Lipitor [Atorvastatin Calcium]   . Sulfa Antibiotics   . Tetanus Toxoids     Current Outpatient Medications on File Prior to Visit  Medication Sig Dispense Refill  . loperamide (IMODIUM) 2 MG capsule Take 1 capsule (2 mg total) by mouth as needed for diarrhea or loose stools. 30 capsule 0  . metoprolol succinate (TOPROL-XL) 25 MG 24 hr tablet TAKE ONE TABLET BY MOUTH DAILY. 30 tablet 0  . silodosin (RAPAFLO) 8 MG CAPS capsule Take 8  mg by mouth daily with breakfast.     No current facility-administered medications on file prior to visit.         Objective:   Physical Exam Blood pressure 120/80, pulse 60, temperature 97.9 F (36.6 C), height 5\' 6"  (1.676 m), weight 177 lb 9.6 oz (80.6 kg). Alert and oriented. Skin warm and dry. Oral mucosa is moist.   . Sclera anicteric, conjunctivae is pink. Thyroid not enlarged. No cervical lymphadenopathy. Bilateral wheezes.  Heart regular rate and rhythm.  Abdomen is soft. Bowel sounds are positive. No hepatomegaly. No abdominal masses felt. No tenderness.  No edema to lower extremities.           Assessment & Plan:  Weight loss. Weakness. He also has had a drop in his hemoglobin.  He says his appetite is much better. His stools are firm. He is seeing Dr Hinda Lenis on 11/21/2017 for his kidney. Today, I will get a CBC and CMET.

## 2017-11-09 LAB — CBC WITH DIFFERENTIAL/PLATELET
BASOS ABS: 53 {cells}/uL (ref 0–200)
Basophils Relative: 0.7 %
EOS PCT: 2.8 %
Eosinophils Absolute: 210 cells/uL (ref 15–500)
HEMATOCRIT: 32.7 % — AB (ref 38.5–50.0)
HEMOGLOBIN: 10.6 g/dL — AB (ref 13.2–17.1)
LYMPHS ABS: 1268 {cells}/uL (ref 850–3900)
MCH: 27.5 pg (ref 27.0–33.0)
MCHC: 32.4 g/dL (ref 32.0–36.0)
MCV: 84.9 fL (ref 80.0–100.0)
MONOS PCT: 12.2 %
MPV: 10.4 fL (ref 7.5–12.5)
NEUTROS ABS: 5055 {cells}/uL (ref 1500–7800)
Neutrophils Relative %: 67.4 %
Platelets: 288 10*3/uL (ref 140–400)
RBC: 3.85 10*6/uL — AB (ref 4.20–5.80)
RDW: 13.7 % (ref 11.0–15.0)
Total Lymphocyte: 16.9 %
WBC mixed population: 915 cells/uL (ref 200–950)
WBC: 7.5 10*3/uL (ref 3.8–10.8)

## 2017-11-09 LAB — COMPREHENSIVE METABOLIC PANEL
AG RATIO: 1.8 (calc) (ref 1.0–2.5)
ALT: 17 U/L (ref 9–46)
AST: 12 U/L (ref 10–35)
Albumin: 4.3 g/dL (ref 3.6–5.1)
Alkaline phosphatase (APISO): 69 U/L (ref 40–115)
BILIRUBIN TOTAL: 0.4 mg/dL (ref 0.2–1.2)
BUN/Creatinine Ratio: 10 (calc) (ref 6–22)
BUN: 26 mg/dL — ABNORMAL HIGH (ref 7–25)
CALCIUM: 11.4 mg/dL — AB (ref 8.6–10.3)
CHLORIDE: 99 mmol/L (ref 98–110)
CO2: 30 mmol/L (ref 20–32)
Creat: 2.68 mg/dL — ABNORMAL HIGH (ref 0.70–1.18)
GLOBULIN: 2.4 g/dL (ref 1.9–3.7)
Glucose, Bld: 85 mg/dL (ref 65–139)
Potassium: 5.9 mmol/L — ABNORMAL HIGH (ref 3.5–5.3)
SODIUM: 137 mmol/L (ref 135–146)
Total Protein: 6.7 g/dL (ref 6.1–8.1)

## 2017-11-10 ENCOUNTER — Telehealth (INDEPENDENT_AMBULATORY_CARE_PROVIDER_SITE_OTHER): Payer: Self-pay | Admitting: Internal Medicine

## 2017-11-10 DIAGNOSIS — E875 Hyperkalemia: Secondary | ICD-10-CM

## 2017-11-10 NOTE — Telephone Encounter (Signed)
He will have potassium drawn Monday

## 2017-11-13 DIAGNOSIS — N183 Chronic kidney disease, stage 3 (moderate): Secondary | ICD-10-CM | POA: Diagnosis not present

## 2017-11-13 DIAGNOSIS — I10 Essential (primary) hypertension: Secondary | ICD-10-CM | POA: Diagnosis not present

## 2017-11-13 DIAGNOSIS — Z79899 Other long term (current) drug therapy: Secondary | ICD-10-CM | POA: Diagnosis not present

## 2017-11-13 DIAGNOSIS — D519 Vitamin B12 deficiency anemia, unspecified: Secondary | ICD-10-CM | POA: Diagnosis not present

## 2017-11-13 DIAGNOSIS — R809 Proteinuria, unspecified: Secondary | ICD-10-CM | POA: Diagnosis not present

## 2017-11-13 DIAGNOSIS — D509 Iron deficiency anemia, unspecified: Secondary | ICD-10-CM | POA: Diagnosis not present

## 2017-11-14 ENCOUNTER — Telehealth: Payer: Self-pay

## 2017-11-14 NOTE — Telephone Encounter (Signed)
-----   Message from Alyson Locket, [place] sent at 11/09/2017  5:15 PM EST ----- This is a MBD pt can you call him and set this up for him to see MBD.  ----- Message ----- From: [name], MD Sent: 11/09/2017   7:00 AM To: Alyson Locket, RMA  Calcium on blood work is high.  I need to see him for hospital follow up to determine what is going on (30 min appt)

## 2017-11-14 NOTE — Telephone Encounter (Signed)
I spoke with patient he stated he had already spoke with Mike Davila about about the hospiital follow up and decided not to schedule the appointment at this time.   Patient states he is following up with the GI as well as the kidney doctor to monitor lab work. Patient states he had an appointment with the GI doctor 11/13/2017 and appointment with kidney doctor 11/21/2017.

## 2017-11-21 DIAGNOSIS — I10 Essential (primary) hypertension: Secondary | ICD-10-CM | POA: Diagnosis not present

## 2017-11-21 DIAGNOSIS — E875 Hyperkalemia: Secondary | ICD-10-CM | POA: Diagnosis not present

## 2017-11-21 DIAGNOSIS — N200 Calculus of kidney: Secondary | ICD-10-CM | POA: Diagnosis not present

## 2017-11-21 DIAGNOSIS — N184 Chronic kidney disease, stage 4 (severe): Secondary | ICD-10-CM | POA: Diagnosis not present

## 2017-11-21 DIAGNOSIS — D631 Anemia in chronic kidney disease: Secondary | ICD-10-CM | POA: Diagnosis not present

## 2017-11-28 DIAGNOSIS — H6121 Impacted cerumen, right ear: Secondary | ICD-10-CM | POA: Insufficient documentation

## 2017-12-29 ENCOUNTER — Other Ambulatory Visit: Payer: Self-pay | Admitting: Physician Assistant

## 2018-02-28 DIAGNOSIS — R809 Proteinuria, unspecified: Secondary | ICD-10-CM | POA: Diagnosis not present

## 2018-02-28 DIAGNOSIS — D509 Iron deficiency anemia, unspecified: Secondary | ICD-10-CM | POA: Diagnosis not present

## 2018-02-28 DIAGNOSIS — I10 Essential (primary) hypertension: Secondary | ICD-10-CM | POA: Diagnosis not present

## 2018-02-28 DIAGNOSIS — E559 Vitamin D deficiency, unspecified: Secondary | ICD-10-CM | POA: Diagnosis not present

## 2018-02-28 DIAGNOSIS — N183 Chronic kidney disease, stage 3 (moderate): Secondary | ICD-10-CM | POA: Diagnosis not present

## 2018-03-23 DIAGNOSIS — N183 Chronic kidney disease, stage 3 (moderate): Secondary | ICD-10-CM | POA: Diagnosis not present

## 2018-03-23 DIAGNOSIS — I10 Essential (primary) hypertension: Secondary | ICD-10-CM | POA: Diagnosis not present

## 2018-03-23 DIAGNOSIS — D649 Anemia, unspecified: Secondary | ICD-10-CM | POA: Diagnosis not present

## 2018-03-23 DIAGNOSIS — R809 Proteinuria, unspecified: Secondary | ICD-10-CM | POA: Diagnosis not present

## 2018-03-27 ENCOUNTER — Other Ambulatory Visit: Payer: Self-pay | Admitting: Physician Assistant

## 2018-03-28 DIAGNOSIS — N202 Calculus of kidney with calculus of ureter: Secondary | ICD-10-CM | POA: Diagnosis not present

## 2018-03-28 DIAGNOSIS — R351 Nocturia: Secondary | ICD-10-CM | POA: Diagnosis not present

## 2018-03-28 DIAGNOSIS — N401 Enlarged prostate with lower urinary tract symptoms: Secondary | ICD-10-CM | POA: Diagnosis not present

## 2018-04-24 ENCOUNTER — Other Ambulatory Visit: Payer: Self-pay | Admitting: Urology

## 2018-04-24 DIAGNOSIS — N201 Calculus of ureter: Secondary | ICD-10-CM | POA: Diagnosis not present

## 2018-05-04 ENCOUNTER — Encounter (HOSPITAL_BASED_OUTPATIENT_CLINIC_OR_DEPARTMENT_OTHER): Payer: Self-pay | Admitting: *Deleted

## 2018-05-04 ENCOUNTER — Other Ambulatory Visit: Payer: Self-pay

## 2018-05-04 NOTE — Progress Notes (Signed)
Spoke w/ pt via phone for pre-op interview.  Npo after mn.  Arrive at 0800.  Needs istat.  Current ekg in chart and epic.  Will take toprol am dos w/ sips of water.

## 2018-05-09 ENCOUNTER — Ambulatory Visit (HOSPITAL_BASED_OUTPATIENT_CLINIC_OR_DEPARTMENT_OTHER): Payer: Medicare Other | Admitting: Anesthesiology

## 2018-05-09 ENCOUNTER — Encounter (HOSPITAL_BASED_OUTPATIENT_CLINIC_OR_DEPARTMENT_OTHER)
Admission: RE | Disposition: A | Discharge status: Home / Self Care | Payer: Self-pay | Source: Ambulatory Visit | Attending: Urology

## 2018-05-09 ENCOUNTER — Encounter (HOSPITAL_BASED_OUTPATIENT_CLINIC_OR_DEPARTMENT_OTHER): Payer: Self-pay | Admitting: *Deleted

## 2018-05-09 ENCOUNTER — Ambulatory Visit (HOSPITAL_BASED_OUTPATIENT_CLINIC_OR_DEPARTMENT_OTHER)
Admission: RE | Admit: 2018-05-09 | Discharge: 2018-05-09 | Disposition: A | Discharge status: Home / Self Care | Payer: Medicare Other | Source: Ambulatory Visit | Attending: Urology | Admitting: Urology

## 2018-05-09 DIAGNOSIS — E7800 Pure hypercholesterolemia, unspecified: Secondary | ICD-10-CM | POA: Insufficient documentation

## 2018-05-09 DIAGNOSIS — N401 Enlarged prostate with lower urinary tract symptoms: Secondary | ICD-10-CM | POA: Diagnosis not present

## 2018-05-09 DIAGNOSIS — I10 Essential (primary) hypertension: Secondary | ICD-10-CM | POA: Diagnosis not present

## 2018-05-09 DIAGNOSIS — R338 Other retention of urine: Secondary | ICD-10-CM | POA: Diagnosis not present

## 2018-05-09 DIAGNOSIS — N201 Calculus of ureter: Secondary | ICD-10-CM | POA: Diagnosis not present

## 2018-05-09 DIAGNOSIS — Z87891 Personal history of nicotine dependence: Secondary | ICD-10-CM | POA: Diagnosis not present

## 2018-05-09 DIAGNOSIS — D649 Anemia, unspecified: Secondary | ICD-10-CM | POA: Diagnosis not present

## 2018-05-09 DIAGNOSIS — E785 Hyperlipidemia, unspecified: Secondary | ICD-10-CM | POA: Diagnosis not present

## 2018-05-09 DIAGNOSIS — Z79899 Other long term (current) drug therapy: Secondary | ICD-10-CM | POA: Insufficient documentation

## 2018-05-09 DIAGNOSIS — N200 Calculus of kidney: Secondary | ICD-10-CM

## 2018-05-09 HISTORY — DX: Personal history of other diseases of the musculoskeletal system and connective tissue: Z87.39

## 2018-05-09 HISTORY — DX: Presence of dental prosthetic device (complete) (partial): Z97.2

## 2018-05-09 HISTORY — DX: Calculus of ureter: N20.1

## 2018-05-09 HISTORY — DX: Mixed hyperlipidemia: E78.2

## 2018-05-09 HISTORY — DX: Calculus of kidney: N20.0

## 2018-05-09 HISTORY — DX: Diverticulosis of large intestine without perforation or abscess without bleeding: K57.30

## 2018-05-09 HISTORY — DX: Male erectile dysfunction, unspecified: N52.9

## 2018-05-09 HISTORY — DX: Personal history of other diseases of urinary system: Z87.448

## 2018-05-09 HISTORY — DX: Chronic kidney disease, stage 3 unspecified: N18.30

## 2018-05-09 HISTORY — DX: Prediabetes: R73.03

## 2018-05-09 HISTORY — DX: Unspecified right bundle-branch block: I45.10

## 2018-05-09 HISTORY — DX: Personal history of other specified conditions: Z87.898

## 2018-05-09 HISTORY — DX: Chronic kidney disease, stage 3 (moderate): N18.3

## 2018-05-09 HISTORY — DX: Benign prostatic hyperplasia with lower urinary tract symptoms: N40.1

## 2018-05-09 HISTORY — DX: Anemia, unspecified: D64.9

## 2018-05-09 HISTORY — DX: Personal history of other diseases of the digestive system: Z87.19

## 2018-05-09 HISTORY — PX: CYSTOSCOPY/URETEROSCOPY/HOLMIUM LASER/STENT PLACEMENT: SHX6546

## 2018-05-09 HISTORY — DX: Personal history of other endocrine, nutritional and metabolic disease: Z86.39

## 2018-05-09 LAB — POCT I-STAT 4, (NA,K, GLUC, HGB,HCT)
Glucose, Bld: 104 mg/dL — ABNORMAL HIGH (ref 70–99)
HEMATOCRIT: 36 % — AB (ref 39.0–52.0)
Hemoglobin: 12.2 g/dL — ABNORMAL LOW (ref 13.0–17.0)
Potassium: 3.8 mmol/L (ref 3.5–5.1)
Sodium: 143 mmol/L (ref 135–145)

## 2018-05-09 SURGERY — CYSTOSCOPY/URETEROSCOPY/HOLMIUM LASER/STENT PLACEMENT
Anesthesia: General | Laterality: Right

## 2018-05-09 MED ORDER — FENTANYL CITRATE (PF) 100 MCG/2ML IJ SOLN
INTRAMUSCULAR | Status: DC | PRN
  Administered 2018-05-09 (×2): 50 ug via INTRAVENOUS

## 2018-05-09 MED ORDER — TRAMADOL HCL 50 MG PO TABS
ORAL_TABLET | ORAL | Status: AC
Start: 2018-05-09 — End: ?
  Filled 2018-05-09: qty 1

## 2018-05-09 MED ORDER — PROPOFOL 10 MG/ML IV BOLUS
INTRAVENOUS | Status: DC | PRN
  Administered 2018-05-09: 50 mg via INTRAVENOUS
  Administered 2018-05-09: 150 mg via INTRAVENOUS

## 2018-05-09 MED ORDER — MEPERIDINE HCL 25 MG/ML IJ SOLN
6.2500 mg | INTRAMUSCULAR | Status: DC | PRN
  Filled 2018-05-09: qty 1

## 2018-05-09 MED ORDER — PHENAZOPYRIDINE HCL 200 MG PO TABS
200.0000 mg | ORAL_TABLET | Freq: Three times a day (TID) | ORAL | Status: DC
  Administered 2018-05-09: 200 mg via ORAL
  Filled 2018-05-09: qty 1

## 2018-05-09 MED ORDER — FUROSEMIDE 10 MG/ML IJ SOLN
20.0000 mg | Freq: Once | INTRAMUSCULAR | Status: AC
  Administered 2018-05-09: 20 mg via INTRAVENOUS
  Filled 2018-05-09: qty 2

## 2018-05-09 MED ORDER — CEFAZOLIN SODIUM-DEXTROSE 2-4 GM/100ML-% IV SOLN
2.0000 g | INTRAVENOUS | Status: AC
  Administered 2018-05-09: 2 g via INTRAVENOUS
  Filled 2018-05-09: qty 100

## 2018-05-09 MED ORDER — HYDROMORPHONE HCL 1 MG/ML IJ SOLN
INTRAMUSCULAR | Status: AC
  Filled 2018-05-09: qty 1

## 2018-05-09 MED ORDER — LIDOCAINE 2% (20 MG/ML) 5 ML SYRINGE
INTRAMUSCULAR | Status: DC | PRN
  Administered 2018-05-09: 100 mg via INTRAVENOUS

## 2018-05-09 MED ORDER — CIPROFLOXACIN HCL 500 MG PO TABS: 500.0000 mg | ORAL_TABLET | Freq: Once | ORAL | 0 refills | Status: AC

## 2018-05-09 MED ORDER — FENTANYL CITRATE (PF) 100 MCG/2ML IJ SOLN
INTRAMUSCULAR | Status: AC
  Filled 2018-05-09: qty 2

## 2018-05-09 MED ORDER — TRAMADOL HCL 50 MG PO TABS: 50.0000 mg | ORAL_TABLET | Freq: Four times a day (QID) | ORAL | 0 refills | Status: DC | PRN

## 2018-05-09 MED ORDER — CEFAZOLIN SODIUM-DEXTROSE 2-4 GM/100ML-% IV SOLN
INTRAVENOUS | Status: AC
  Filled 2018-05-09: qty 100

## 2018-05-09 MED ORDER — LIDOCAINE HCL URETHRAL/MUCOSAL 2 % EX GEL
CUTANEOUS | Status: DC | PRN
  Administered 2018-05-09: 1 via URETHRAL

## 2018-05-09 MED ORDER — HYDROMORPHONE HCL 1 MG/ML IJ SOLN
0.2500 mg | INTRAMUSCULAR | Status: DC | PRN
  Administered 2018-05-09: 0.5 mg via INTRAVENOUS
  Administered 2018-05-09 (×4): 0.25 mg via INTRAVENOUS
  Filled 2018-05-09: qty 0.5

## 2018-05-09 MED ORDER — PHENAZOPYRIDINE HCL 200 MG PO TABS: 200.0000 mg | ORAL_TABLET | Freq: Three times a day (TID) | ORAL | 0 refills | Status: DC | PRN

## 2018-05-09 MED ORDER — LIDOCAINE 2% (20 MG/ML) 5 ML SYRINGE
INTRAMUSCULAR | Status: AC
  Filled 2018-05-09: qty 5

## 2018-05-09 MED ORDER — TRAMADOL HCL 50 MG PO TABS
ORAL_TABLET | ORAL | Status: AC
  Filled 2018-05-09: qty 1

## 2018-05-09 MED ORDER — DEXAMETHASONE SODIUM PHOSPHATE 4 MG/ML IJ SOLN
INTRAMUSCULAR | Status: DC | PRN
  Administered 2018-05-09: 10 mg via INTRAVENOUS

## 2018-05-09 MED ORDER — ONDANSETRON HCL 4 MG/2ML IJ SOLN
INTRAMUSCULAR | Status: DC | PRN
  Administered 2018-05-09: 4 mg via INTRAVENOUS

## 2018-05-09 MED ORDER — SODIUM CHLORIDE 0.9 % IR SOLN
Status: DC | PRN
  Administered 2018-05-09: 6000 mL via INTRAVESICAL

## 2018-05-09 MED ORDER — FUROSEMIDE 10 MG/ML IJ SOLN
INTRAMUSCULAR | Status: AC
  Filled 2018-05-09: qty 4

## 2018-05-09 MED ORDER — IOHEXOL 300 MG/ML  SOLN
INTRAMUSCULAR | Status: DC | PRN
  Administered 2018-05-09: 40 mL via URETHRAL

## 2018-05-09 MED ORDER — PROPOFOL 10 MG/ML IV BOLUS
INTRAVENOUS | Status: AC
Start: 2018-05-09 — End: ?
  Filled 2018-05-09: qty 20

## 2018-05-09 MED ORDER — TRAMADOL HCL 50 MG PO TABS
50.0000 mg | ORAL_TABLET | Freq: Four times a day (QID) | ORAL | Status: DC
  Administered 2018-05-09 (×2): 50 mg via ORAL
  Filled 2018-05-09: qty 1

## 2018-05-09 MED ORDER — PHENAZOPYRIDINE HCL 100 MG PO TABS
ORAL_TABLET | ORAL | Status: AC
  Filled 2018-05-09: qty 2

## 2018-05-09 MED ORDER — SODIUM CHLORIDE 0.9 % IV SOLN
INTRAVENOUS | Status: DC
  Administered 2018-05-09 (×3): via INTRAVENOUS
  Filled 2018-05-09: qty 1000

## 2018-05-09 MED ORDER — ONDANSETRON HCL 4 MG/2ML IJ SOLN
INTRAMUSCULAR | Status: AC
  Filled 2018-05-09: qty 2

## 2018-05-09 MED ORDER — DEXAMETHASONE SODIUM PHOSPHATE 10 MG/ML IJ SOLN
INTRAMUSCULAR | Status: AC
  Filled 2018-05-09: qty 1

## 2018-05-09 MED ORDER — ONDANSETRON HCL 4 MG/2ML IJ SOLN
4.0000 mg | Freq: Once | INTRAMUSCULAR | Status: DC | PRN
  Filled 2018-05-09: qty 2

## 2018-05-09 SURGICAL SUPPLY — 35 items
BAG DRAIN URO-CYSTO SKYTR STRL (DRAIN) ×2 IMPLANT
BAG URINE DRAINAGE (UROLOGICAL SUPPLIES) ×2 IMPLANT
BASKET LASER NITINOL 1.9FR (BASKET) IMPLANT
BASKET STNLS GEMINI 4WIRE 3FR (BASKET) IMPLANT
BASKET STONE 1.7 NGAGE (UROLOGICAL SUPPLIES) ×2 IMPLANT
BASKET ZERO TIP NITINOL 2.4FR (BASKET) IMPLANT
CATH COUDE FOLEY 2W 5CC 18FR (CATHETERS) ×2 IMPLANT
CATH URET 5FR 28IN OPEN ENDED (CATHETERS) ×2 IMPLANT
CATH URET DUAL LUMEN 6-10FR 50 (CATHETERS) IMPLANT
CLOTH BEACON ORANGE TIMEOUT ST (SAFETY) IMPLANT
DRSG TEGADERM 2-3/8X2-3/4 SM (GAUZE/BANDAGES/DRESSINGS) ×2 IMPLANT
EXTRACTOR STONE 1.7FRX115CM (UROLOGICAL SUPPLIES) IMPLANT
FIBER LASER FLEXIVA 365 (UROLOGICAL SUPPLIES) ×2 IMPLANT
FIBER LASER TRAC TIP (UROLOGICAL SUPPLIES) IMPLANT
GLOVE BIO SURGEON STRL SZ7.5 (GLOVE) ×2 IMPLANT
GOWN STRL REUS W/TWL XL LVL3 (GOWN DISPOSABLE) ×2 IMPLANT
GUIDEWIRE 0.038 PTFE COATED (WIRE) IMPLANT
GUIDEWIRE ANG ZIPWIRE 038X150 (WIRE) IMPLANT
GUIDEWIRE STR DUAL SENSOR (WIRE) ×2 IMPLANT
HOLDER FOLEY CATH W/STRAP (MISCELLANEOUS) ×4 IMPLANT
INFUSOR MANOMETER BAG 3000ML (MISCELLANEOUS) IMPLANT
IV NS IRRIG 3000ML ARTHROMATIC (IV SOLUTION) ×2 IMPLANT
KIT BALLIN UROMAX 15FX10 (LABEL) IMPLANT
KIT BALLN UROMAX 15FX4 (MISCELLANEOUS) IMPLANT
KIT BALLN UROMAX 26 75X4 (MISCELLANEOUS)
KIT TURNOVER CYSTO (KITS) ×2 IMPLANT
MANIFOLD NEPTUNE II (INSTRUMENTS) ×2 IMPLANT
NS IRRIG 500ML POUR BTL (IV SOLUTION) ×2 IMPLANT
PACK CYSTO (CUSTOM PROCEDURE TRAY) ×2 IMPLANT
SET HIGH PRES BAL DIL (LABEL)
SHEATH ACCESS URETERAL 38CM (SHEATH) IMPLANT
STENT URET 6FRX26 CONTOUR (STENTS) ×2 IMPLANT
SYR 30ML LL (SYRINGE) ×2 IMPLANT
TUBE CONNECTING 12X1/4 (SUCTIONS) ×2 IMPLANT
TUBING UROLOGY SET (TUBING) ×2 IMPLANT

## 2018-05-09 NOTE — H&P (Signed)
f/u for obstructing stone  HPI: Mike Davila is a 72 year-old male established patient who is here for further eval and management of an obstructing stone.  The patient was last seen 1 month prior.   The patient's stone is on his left side. The stone was 69mm distal ureter. There are additional stones within the urinary tract. They are located large right renal pelvic stone.   The patient has not passed their stone since his visit. The patient denies fevers, chills, nausea, vomiting, flank pain, groin pain, or progressive voiding symptoms. The patient underwent KUB prior to today's appointment.   Initial admitted to hosptial in Jan 2019 for weakness and acute renal failure - noted to have enlarged prostate and urinary retention. He was also noted to have a 87mm stone in the distal left ureter with no hydro. Recently seen here by Azucena Fallen and started on URoxatrol for his BPH. He has declined treatment for his stone.   The patient denies any significant voiding symptoms. He denies any hematuria. He denies any flank pain. He has not had any fevers or chills. He was started on antibiotics at his last office visit for bacteriuria. At that point he was asymptomatic.   The patient has had numerous surgeries, and tolerated anesthesia well. He is on high blood pressure medication and Alfuzosin, otherwise takes no medicine.     ALLERGIES: crestor lipitor Sulfa (Sulfonamide Antibiotics) Tetanus    MEDICATIONS: Metoprolol Succinate 25 mg tablet, extended release 24 hr  Uroxatral 10 mg tablet, extended release 24 hr 1 tablet PO Daily  Viagra 100 mg tablet  Loperamide 2 mg capsule  Promethazine Hcl 25 mg tablet     GU PSH: None   NON-GU PSH: Ankle Arthroscopy/surgery Appendectomy Hernia Repair Remove Tonsils    GU PMH: BPH w/LUTS - 11/06/2017 Nocturia - 11/06/2017 Renal and ureteral calculus - 11/06/2017 Urinary Retention - 11/06/2017 Kidney Failure, acute, Unspec    NON-GU PMH:  Anemia, unspecified Hypercholesterolemia Hypertension Other hyperlipidemia    FAMILY HISTORY: 1 Daughter - Other 1 son - Other father deceased - Other patient's mother is still living - Other    Notes: Mother is 61, Father died from a heart attack.    SOCIAL HISTORY: Marital Status: Married Preferred Language: English; Ethnicity: Not Hispanic Or Latino; Race: White Current Smoking Status: Patient does not smoke anymore. Has not smoked since 10/24/2004.   Tobacco Use Assessment Completed: Used Tobacco in last 30 days? Drinks 1 caffeinated drink per day.    REVIEW OF SYSTEMS:    GU Review Male:   Patient denies frequent urination, hard to postpone urination, burning/ pain with urination, get up at night to urinate, leakage of urine, stream starts and stops, trouble starting your stream, have to strain to urinate , erection problems, and penile pain.  Gastrointestinal (Upper):   Patient denies indigestion/ heartburn, nausea, and vomiting.  Gastrointestinal (Lower):   Patient denies diarrhea and constipation.  Constitutional:   Patient denies fever, night sweats, weight loss, and fatigue.  Skin:   Patient denies skin rash/ lesion and itching.  Eyes:   Patient denies blurred vision and double vision.  Ears/ Nose/ Throat:   Patient denies sore throat and sinus problems.  Hematologic/Lymphatic:   Patient denies swollen glands and easy bruising.  Cardiovascular:   Patient denies leg swelling and chest pains.  Respiratory:   Patient denies cough and shortness of breath.  Endocrine:   Patient denies excessive thirst.  Musculoskeletal:   Patient denies back  pain and joint pain.  Neurological:   Patient denies headaches and dizziness.  Psychologic:   Patient denies depression and anxiety.   VITAL SIGNS:      04/24/2018 09:05 AM  Weight 197 lb / 89.36 kg  Height 66 in / 167.64 cm  BP 130/84 mmHg  Pulse 66 /min  Temperature 97.0 F / 36.1 C  BMI 31.8 kg/m   MULTI-SYSTEM PHYSICAL  EXAMINATION:    Constitutional: Well-nourished. No physical deformities. Normally developed. Good grooming.  Respiratory: No labored breathing, no use of accessory muscles. Normal breath sounds.  Cardiovascular: Regular rate and rhythm. No murmur, no gallop. Normal temperature, normal extremity pulses, no swelling, no varicosities.  Gastrointestinal: No mass, no tenderness, no rigidity, non obese abdomen.     PAST DATA REVIEWED:  Source Of History:  Patient  Records Review:   Previous Doctor Records, Previous Patient Records, POC Tool  X-Ray Review: C.T. Abdomen/Pelvis: Reviewed Films. Discussed With Patient.     PROCEDURES:         KUB - [id number]  A single view of the abdomen is obtained. Renal shadows are easily visualized bilaterally. The renal shadows are visible bilaterally. The patient has a known stone in the left renal pelvis. There are no stones in the right renal pelvis. There is a large stone in the right distal ureter just proximal to the u VJ There are no additional calcifications along the expected location of either ureter bilaterally.  Gas pattern is grossly normal. No significant bony abnormalities.      Impression: Unchanged stone position of the right distal ureter.         Urinalysis w/Scope - 81001 Dipstick Dipstick Cont'd Micro  Specimen: Voided Bilirubin: Neg WBC/hpf: 0 - 5/hpf  Color: Yellow Blood: Neg RBC/hpf: NS (Not Seen)  Appearance: Clear Protein: Trace Bacteria: NS (Not Seen)  Specific Gravity: 1.020 Urobilinogen: 0.2 Casts: NS (Not Seen)  pH: 6.0 Nitrites: Neg Trichomonas: Not Present  Glucose: Neg Leukocyte Esterase: 1+ Mucous: Not Present      Epithelial Cells: 0 - 5/hpf      Yeast: NS (Not Seen)      Sperm: Not Present    ASSESSMENT:      ICD-10 Details  1 GU:   Ureteral calculus - N20.1    PLAN:           Document Letter(s):  Created for Patient: Clinical Summary    I went over the treatment options for their stone. We discussed ongoing  medical expulsion therapy, ESWL and ureteroscopy. Ultimately the patient and I agreed that ureterscopy is the best option. I went over this surgery with the patient in detail. The patient understands after being put to sleep, we would proceed with a telescope to access the stone and potentially use a laser to fragment the stone before removing it with a basket. After removing the stone the patient will require temporary stent placement in the ureter. This is an outpatient procedure. I also discussed the potential of not being able to gain access safely into the ureter/kidney. This would require that a stent be placed and then the patient rescheduled several weeks later for a second attempt. They also understand the small risks of ureteral trauma causing a stricture or permanent damage. I also explained the risk of urinary tract infection. Having gone over the procedure itself, the expected outcome, and the risks/benefits the patient has agreed to proceed.        Notes:   The patient has  a stone in the right distal ureter, fortunately is not causing him any hydronephrosis or symptoms. However, that right kidney is atrophic. I recommended treatment. I'm concerned that the stone is impacted and may not pass it fragmented with lithotripsy. As such, recommended the patient consider ureteroscopy.

## 2018-05-09 NOTE — Progress Notes (Signed)
Patient remains comfortable-denies any bladder pressure or pain-Urine remains clear red-no clots.Spoke with Dr Louis Meckel at 1810-plan discharge home as discussed earlier -Patient and wife feel comfortable with going home.

## 2018-05-09 NOTE — Progress Notes (Addendum)
After transfer to chair sudden increase in pain SOB-placed on monitor oxygen 93 % HR 73- BP 77/62 -follow up  BP 95/65  Foley with scant output-100 cc saline instilled-no return. Dr Louis Meckel paged-waiting for response.

## 2018-05-09 NOTE — Progress Notes (Signed)
Paged Dr Herrick-called office also-they will page him- scant urine draining-abdomen tender-not distended Moved back to PACU to better monitor- patient comfort,Family aware of situation-were in room with his sudden SOB episode.

## 2018-05-09 NOTE — Progress Notes (Signed)
Dr Louis Meckel at the bedside-aware of frequent need to hand irrigate-thick bloody urine present. Will continue to monitor and irrigate as needed- Dr Louis Meckel spoke with family to update on current situation.

## 2018-05-09 NOTE — Progress Notes (Signed)
Hand irrigation of foley with sterile sodium chloride-x 3-thick bloody urine returned with small clots present. IV pain medication given due to increase in bladder pain/ pressure.

## 2018-05-09 NOTE — Discharge Instructions (Signed)
Post Anesthesia Home Care Instructions  Activity: Get plenty of rest for the remainder of the day. A responsible individual must stay with you for 24 hours following the procedure.  For the next 24 hours, DO NOT: -Drive a car -Paediatric nurse -Drink alcoholic beverages -Take any medication unless instructed by your physician -Make any legal decisions or sign important papers.  Meals: Start with liquid foods such as gelatin or soup. Progress to regular foods as tolerated. Avoid greasy, spicy, heavy foods. If nausea and/or vomiting occur, drink only clear liquids until the nausea and/or vomiting subsides. Call your physician if vomiting continues.  Special Instructions/Symptoms: Your throat may feel dry or sore from the anesthesia or the breathing tube placed in your throat during surgery. If this causes discomfort, gargle with warm salt water. The discomfort should disappear within 24 hours.  If you had a scopolamine patch placed behind your ear for the management of post- operative nausea and/or vomiting:  1. The medication in the patch is effective for 72 hours, after which it should be removed.  Wrap patch in a tissue and discard in the trash. Wash hands thoroughly with soap and water. 2. You may remove the patch earlier than 72 hours if you experience unpleasant side effects which may include dry mouth, dizziness or visual disturbances. 3. Avoid touching the patch. Wash your hands with soap and water after contact with the patch.   DISCHARGE INSTRUCTIONS FOR KIDNEY STONE/URETERAL STENT   MEDICATIONS:  1. Resume all your other meds from home - except do not take any extra narcotic pain meds that you may have at home.  2. Pyridium is to help with the burning/stinging when you urinate. 3. Tramadol is for moderate/severe pain, otherwise taking upto 1000 mg every 6 hours of plainTylenol will help treat your pain.   4. Take Cipro one hour prior to removal of your stent.   ACTIVITY:    1. No strenuous activity x 1week  2. No driving while on narcotic pain medications  3. Drink plenty of water  4. Continue to walk at home - you can still get blood clots when you are at home, so keep active, but don't over do it.  5. May return to work/school tomorrow or when you feel ready   BATHING:  1. You can shower and we recommend daily showers  2. You have a string coming from your urethra: The stent string is attached to your ureteral stent. Do not pull on this.   SIGNS/SYMPTOMS TO CALL:  Please call us if you have a fever greater than 101.5, uncontrolled nausea/vomiting, uncontrolled pain, dizziness, unable to urinate, bloody urine, chest pain, shortness of breath, leg swelling, leg pain, redness around wound, drainage from wound, or any other concerns or questions.   You can reach [place] at [phone].   FOLLOW-UP:  1. You have an appointment in 6 weeks with a ultrasound of your kidneys prior. 2. You have a string attached to your stent,will come to office for stent and catheter to be removed   on Friday, July 19th. Office will call you with a time    Post Anesthesia Home Care Instructions  Activity: Get plenty of rest for the remainder of the day. A responsible individual must stay with you for 24 hours following the procedure.  For the next 24 hours, DO NOT: -Drive a car -Paediatric nurse -Drink alcoholic beverages -Take any medication unless instructed by your physician -Make any legal decisions or sign important papers.  Meals: Start with liquid foods such as gelatin or soup. Progress to regular foods as tolerated. Avoid greasy, spicy, heavy foods. If nausea and/or vomiting occur, drink only clear liquids until the nausea and/or vomiting subsides. Call your physician if vomiting continues.  Special Instructions/Symptoms: Your throat may feel dry or sore from the anesthesia or the breathing tube placed in your throat during surgery. If this causes discomfort, gargle  with warm salt water. The discomfort should disappear within 24 hours.  If you had a scopolamine patch placed behind your ear for the management of post- operative nausea and/or vomiting:  1. The medication in the patch is effective for 72 hours, after which it should be removed.  Wrap patch in a tissue and discard in the trash. Wash hands thoroughly with soap and water. 2. You may remove the patch earlier than 72 hours if you experience unpleasant side effects which may include dry mouth, dizziness or visual disturbances. 3. Avoid touching the patch. Wash your hands with soap and water after contact with the patch.   Indwelling Urinary Catheter Care, Adult Take good care of your catheter to keep it working and to prevent problems. How to wear your catheter Attach your catheter to your leg with tape (adhesive tape) or a leg strap. Make sure it is not too tight. If you use tape, remove any bits of tape that are already on the catheter. How to wear a drainage bag You should have:  A large overnight bag.  A small leg bag.  Overnight Bag You may wear the overnight bag at any time. Always keep the bag below the level of your bladder but off the floor. When you sleep, put a clean plastic bag in a wastebasket. Then hang the bag inside the wastebasket. Leg Bag Never wear the leg bag at night. Always wear the leg bag below your knee. Keep the leg bag secure with a leg strap or tape. How to care for your skin  Clean the skin around the catheter at least once every day.  Shower every day. Do not take baths.  Put creams, lotions, or ointments on your genital area only as told by your doctor.  Do not use powders, sprays, or lotions on your genital area. How to clean your catheter and your skin 1. Wash your hands with soap and water. 2. Wet a washcloth in warm water and gentle (mild) soap. 3. Use the washcloth to clean the skin where the catheter enters your body. Clean downward and wipe away  from the catheter in small circles. Do not wipe toward the catheter. 4. Pat the area dry with a clean towel. Make sure to clean off all soap. How to care for your drainage bags Empty your drainage bag when it is ?- full or at least 2-3 times a day. Replace your drainage bag once a month or sooner if it starts to smell bad or look dirty. Do not clean your drainage bag unless told by your doctor. Emptying a drainage bag  Supplies Needed  Rubbing alcohol.  Gauze pad or cotton ball.  Tape or a leg strap.  Steps 1. Wash your hands with soap and water. 2. Separate (detach) the bag from your leg. 3. Hold the bag over the toilet or a clean container. Keep the bag below your hips and bladder. This stops pee (urine) from going back into the tube. 4. Open the pour spout at the bottom of the bag. 5. Empty the pee into the  toilet or container. Do not let the pour spout touch any surface. 6. Put rubbing alcohol on a gauze pad or cotton ball. 7. Use the gauze pad or cotton ball to clean the pour spout. 8. Close the pour spout. 9. Attach the bag to your leg with tape or a leg strap. 10. Wash your hands.  Changing a drainage bag Supplies Needed  Alcohol wipes.  A clean drainage bag.  Adhesive tape or a leg strap.  Steps 1. Wash your hands with soap and water. 2. Separate the dirty bag from your leg. 3. Pinch the rubber catheter with your fingers so that pee does not spill out. 4. Separate the catheter tube from the drainage tube where these tubes connect (at the connection valve). Do not let the tubes touch any surface. 5. Clean the end of the catheter tube with an alcohol wipe. Use a different alcohol wipe to clean the end of the drainage tube. 6. Connect the catheter tube to the drainage tube of the clean bag. 7. Attach the new bag to the leg with adhesive tape or a leg strap. 8. Wash your hands.  How to prevent infection and other problems  Never pull on your catheter or try to  remove it. Pulling can damage tissue in your body.  Always wash your hands before and after touching your catheter.  If a leg strap gets wet, replace it with a dry one.  Drink enough fluids to keep your pee clear or pale yellow, or as told by your doctor.  Do not let the drainage bag or tubing touch the floor.  Wear cotton underwear.  If you are male, wipe from front to back after you poop (have a bowel movement).  Check on the catheter often to make sure it works and the tubing is not twisted. Get help if:  Your pee is cloudy.  Your pee smells unusually bad.  Your pee is not draining into the bag.  Your tube gets clogged.  Your catheter starts to leak.  Your bladder feels full. Get help right away if:  You have redness, swelling, or pain where the catheter enters your body.  You have fluid, pus, or a bad smell coming from the area where the catheter enters your body.  The area where the catheter enters your body feels warm.  You have a fever.  You have pain in your: ? Stomach (abdomen). ? Legs. ? Lower back. ? Bladder.  You see blood fill the catheter.  Your pee is pink or red.  You feel sick to your stomach (nauseous).  You throw up (vomit).  You have chills.  Your catheter gets pulled out. This information is not intended to replace advice given to you by your health care provider. Make sure you discuss any questions you have with your health care provider. Document Released: 02/04/2013 Document Revised: 09/07/2016 Document Reviewed: 03/25/2014 Elsevier Interactive Patient Education  [employer].

## 2018-05-09 NOTE — Transfer of Care (Signed)
  Last Vitals:  Vitals Value Taken Time  BP 146/96 05/09/2018 11:01 AM  Temp    Pulse 83 05/09/2018 11:03 AM  Resp 15 05/09/2018 11:03 AM  SpO2 95 % 05/09/2018 11:03 AM  Vitals shown include unvalidated device data.  Last Pain:  Vitals:   05/09/18 0802  TempSrc: Oral         Immediate Anesthesia Transfer of Care Note  Patient: Mike Davila  Procedure(s) Performed: Procedure(s) (LRB): CYSTOSCOPY RIGHT URETEROSCOPY/HOLMIUM LASER/STENT PLACEMENT (Right)  Patient Location: PACU  Anesthesia Type: General  Level of Consciousness: awake, alert  and oriented  Airway & Oxygen Therapy: Patient Spontanous Breathing and Patient connected to nasal cannula oxygen  Post-op Assessment: Report given to PACU RN and Post -op Vital signs reviewed and stable  Post vital signs: Reviewed and stable  Complications: No apparent anesthesia complications

## 2018-05-09 NOTE — Op Note (Signed)
Preoperative diagnosis: right ureteral calculus  Postoperative diagnosis: right ureteral calculus  Procedure:  1. Cystoscopy 2. right ureteroscopy and stone removal 3. Ureteroscopic laser lithotripsy 4. right 22F x 26 ureteral stent placement  5. right retrograde pyelography with interpretation  Surgeon: Ardis Hughs, MD  Anesthesia: General  Complications: None  Intraoperative findings:  #1: right retrograde pyelography demonstrated a filling defect within the right ureter consistent with the patient's known calculus without other abnormalities. #2:  The patient had a very large obstructing median lobe.  He had trabeculated bladder with some stone sediment within his bladder. #3: At the end of the surgery the patient had bleeding from the middle lobe of his prostate, a 43 French coud tipped catheter was placed and 30 cc inflated the balloon.  EBL: Minimal  Specimens: 1. right ureteral calculus  Disposition of specimens: [facility] for stone analysis  Indication: Mike Davila is a 72 y.o.   patient with a acute renal failure.  Work-up demonstrated a large right mid ureteral stone with a slightly atrophic kidney on that side but no hydronephrosis.  After reviewing the management options for treatment, the patient elected to proceed with the above surgical procedure(s). We have discussed the potential benefits and risks of the procedure, side effects of the proposed treatment, the likelihood of the patient achieving the goals of the procedure, and any potential problems that might occur during the procedure or recuperation. Informed consent has been obtained.   Description of procedure:  The patient was taken to the operating room and general anesthesia was induced.  The patient was placed in the dorsal lithotomy position, prepped and draped in the usual sterile fashion, and preoperative antibiotics were administered. A preoperative time-out was  performed.   Cystourethroscopy was performed.  The patient's urethra was examined and was normal, bilobar prostatic hypertrophy with a median lobe. The bladder was then systematically examined in its entirety. There was no evidence for any bladder tumors, stones, or other mucosal pathology.    Attention then turned to the right ureteral orifice and a ureteral catheter was used to intubate the ureteral orifice.  Omnipaque contrast was injected through the ureteral catheter and a retrograde pyelogram was performed with findings as dictated above.  A 0.38 sensor guidewire was then advanced up the right ureter into the renal pelvis under fluoroscopic guidance. The 6 Fr semirigid ureteroscope was then advanced into the ureter next to the guidewire and the calculus was identified.   The stone was then fragmented with the 365 micron holmium laser fiber on a setting of 0.6 and frequency of 6 Hz.   All stones were then removed from the ureter with an N-gage nitinol basket.  Reinspection of the ureter revealed no remaining visible stones or fragments.   The wire was then backloaded through the cystoscope and a ureteral stent was advance over the wire using Seldinger technique.  The stent was positioned appropriately under fluoroscopic and cystoscopic guidance.  The wire was then removed with an adequate stent curl noted in the renal pelvis as well as in the bladder.  The bladder was then emptied and the procedure ended.  The patient appeared to tolerate the procedure well and without complications.  The patient was able to be awakened and transferred to the recovery unit in satisfactory condition.   At this point, I opted to place a coud tip Foley catheter given the hematuria noted in the patient's bladder.  This was performed using an 64 French catheter.  The patient was hand irrigated in the PACU and given Lasix prior to Foley removal.  Prior to removal, the catheter had cleared.  Disposition: The tether  of the stent was left on and secured to the ventral aspect of the patient's penis. Instructions for removing the stent have been provided to the patient. The patient has been scheduled for followup in 6 weeks with a renal ultrasound.

## 2018-05-09 NOTE — Anesthesia Preprocedure Evaluation (Signed)
Anesthesia Evaluation  Patient identified by MRN, date of birth, ID band Patient awake    Reviewed: Allergy & Precautions, NPO status , Patient's Chart, lab work & pertinent test results  Airway Mallampati: I  TM Distance: >3 FB Neck ROM: Full    Dental   Pulmonary former smoker,    Pulmonary exam normal        Cardiovascular hypertension, Normal cardiovascular exam     Neuro/Psych    GI/Hepatic   Endo/Other    Renal/GU      Musculoskeletal   Abdominal   Peds  Hematology   Anesthesia Other Findings   Reproductive/Obstetrics                             Anesthesia Physical Anesthesia Plan  ASA: III  Anesthesia Plan: General   Post-op Pain Management:    Induction: Intravenous  PONV Risk Score and Plan: 2 and Ondansetron and Treatment may vary due to age or medical condition  Airway Management Planned: LMA  Additional Equipment:   Intra-op Plan:   Post-operative Plan: Extubation in OR  Informed Consent: I have reviewed the patients History and Physical, chart, labs and discussed the procedure including the risks, benefits and alternatives for the proposed anesthesia with the patient or authorized representative who has indicated his/her understanding and acceptance.     Plan Discussed with: CRNA and Surgeon  Anesthesia Plan Comments:         Anesthesia Quick Evaluation

## 2018-05-09 NOTE — Progress Notes (Signed)
Dr Louis Meckel at [facility] balloon deflated--cath advanced-irrigated with approx. 300 sterile saline-numerous small clots returned-urine flowing freely -500 urine drained Balloon inflated with 20 cc water. Patient states comfortable now.

## 2018-05-09 NOTE — Anesthesia Postprocedure Evaluation (Signed)
Anesthesia Post Note  Patient: [name]  Procedure(s) Performed: CYSTOSCOPY RIGHT URETEROSCOPY/HOLMIUM LASER/STENT PLACEMENT (Right )     Patient location during evaluation: PACU Anesthesia Type: General Level of consciousness: awake and alert Pain management: pain level controlled Vital Signs Assessment: post-procedure vital signs reviewed and stable Respiratory status: spontaneous breathing, nonlabored ventilation, respiratory function stable and patient connected to nasal cannula oxygen Cardiovascular status: blood pressure returned to baseline and stable Postop Assessment: no apparent nausea or vomiting Anesthetic complications: no    Last Vitals:  Vitals:   05/09/18 1330 05/09/18 1345  BP: 90/65 105/78  Pulse: 70 77  Resp: 13 20  Temp:    SpO2: 98% 92%    Last Pain:  Vitals:   05/09/18 1330  TempSrc:   PainSc: 6                  Jo Booze DAVID

## 2018-05-09 NOTE — Anesthesia Procedure Notes (Signed)
Procedure Name: LMA Insertion Date/Time: 05/09/2018 10:00 AM Performed by: Lillia Abed, MD Pre-anesthesia Checklist: Patient identified, Emergency Drugs available, Suction available and Patient being monitored Patient Re-evaluated:Patient Re-evaluated prior to induction Oxygen Delivery Method: Circle system utilized Preoxygenation: Pre-oxygenation with 100% oxygen Induction Type: IV induction Ventilation: Mask ventilation without difficulty LMA: LMA inserted LMA Size: 4.0 Number of attempts: 1 Airway Equipment and Method: Bite block Placement Confirmation: positive ETCO2 Tube secured with: Tape Dental Injury: Teeth and Oropharynx as per pre-operative assessment

## 2018-05-10 ENCOUNTER — Encounter (HOSPITAL_BASED_OUTPATIENT_CLINIC_OR_DEPARTMENT_OTHER): Payer: Self-pay | Admitting: Urology

## 2018-05-11 DIAGNOSIS — R338 Other retention of urine: Secondary | ICD-10-CM | POA: Diagnosis not present

## 2018-05-11 DIAGNOSIS — R310 Gross hematuria: Secondary | ICD-10-CM | POA: Diagnosis not present

## 2018-05-11 DIAGNOSIS — N201 Calculus of ureter: Secondary | ICD-10-CM | POA: Diagnosis not present

## 2018-05-16 DIAGNOSIS — R310 Gross hematuria: Secondary | ICD-10-CM | POA: Diagnosis not present

## 2018-05-17 DIAGNOSIS — R310 Gross hematuria: Secondary | ICD-10-CM | POA: Diagnosis not present

## 2018-05-17 DIAGNOSIS — R338 Other retention of urine: Secondary | ICD-10-CM | POA: Diagnosis not present

## 2018-05-24 DIAGNOSIS — N201 Calculus of ureter: Secondary | ICD-10-CM | POA: Diagnosis not present

## 2018-05-31 DIAGNOSIS — R3914 Feeling of incomplete bladder emptying: Secondary | ICD-10-CM | POA: Diagnosis not present

## 2018-05-31 DIAGNOSIS — N3000 Acute cystitis without hematuria: Secondary | ICD-10-CM | POA: Diagnosis not present

## 2018-06-19 DIAGNOSIS — N401 Enlarged prostate with lower urinary tract symptoms: Secondary | ICD-10-CM | POA: Diagnosis not present

## 2018-06-19 DIAGNOSIS — R350 Frequency of micturition: Secondary | ICD-10-CM | POA: Diagnosis not present

## 2018-06-19 DIAGNOSIS — N200 Calculus of kidney: Secondary | ICD-10-CM | POA: Diagnosis not present

## 2018-06-27 ENCOUNTER — Other Ambulatory Visit: Payer: Self-pay | Admitting: Physician Assistant

## 2018-07-20 DIAGNOSIS — D649 Anemia, unspecified: Secondary | ICD-10-CM | POA: Diagnosis not present

## 2018-07-20 DIAGNOSIS — R809 Proteinuria, unspecified: Secondary | ICD-10-CM | POA: Diagnosis not present

## 2018-07-20 DIAGNOSIS — Z79899 Other long term (current) drug therapy: Secondary | ICD-10-CM | POA: Diagnosis not present

## 2018-07-20 DIAGNOSIS — N184 Chronic kidney disease, stage 4 (severe): Secondary | ICD-10-CM | POA: Diagnosis not present

## 2018-07-20 DIAGNOSIS — E875 Hyperkalemia: Secondary | ICD-10-CM | POA: Diagnosis not present

## 2018-07-20 DIAGNOSIS — I10 Essential (primary) hypertension: Secondary | ICD-10-CM | POA: Diagnosis not present

## 2018-07-20 DIAGNOSIS — M9080 Osteopathy in diseases classified elsewhere, unspecified site: Secondary | ICD-10-CM | POA: Diagnosis not present

## 2018-07-20 DIAGNOSIS — E559 Vitamin D deficiency, unspecified: Secondary | ICD-10-CM | POA: Diagnosis not present

## 2018-07-20 DIAGNOSIS — E889 Metabolic disorder, unspecified: Secondary | ICD-10-CM | POA: Diagnosis not present

## 2018-07-31 DIAGNOSIS — I10 Essential (primary) hypertension: Secondary | ICD-10-CM | POA: Diagnosis not present

## 2018-07-31 DIAGNOSIS — R809 Proteinuria, unspecified: Secondary | ICD-10-CM | POA: Diagnosis not present

## 2018-07-31 DIAGNOSIS — N184 Chronic kidney disease, stage 4 (severe): Secondary | ICD-10-CM | POA: Diagnosis not present

## 2018-07-31 DIAGNOSIS — I509 Heart failure, unspecified: Secondary | ICD-10-CM | POA: Diagnosis not present

## 2018-08-08 DIAGNOSIS — C44629 Squamous cell carcinoma of skin of left upper limb, including shoulder: Secondary | ICD-10-CM | POA: Diagnosis not present

## 2018-10-30 ENCOUNTER — Encounter: Payer: Self-pay | Admitting: Family Medicine

## 2018-10-30 ENCOUNTER — Ambulatory Visit (INDEPENDENT_AMBULATORY_CARE_PROVIDER_SITE_OTHER): Payer: Medicare Other | Admitting: Family Medicine

## 2018-10-30 VITALS — BP 138/90 | HR 88 | Temp 98.0°F | Resp 16 | Ht 66.0 in | Wt 201.0 lb

## 2018-10-30 DIAGNOSIS — Z125 Encounter for screening for malignant neoplasm of prostate: Secondary | ICD-10-CM | POA: Diagnosis not present

## 2018-10-30 DIAGNOSIS — I10 Essential (primary) hypertension: Secondary | ICD-10-CM | POA: Diagnosis not present

## 2018-10-30 DIAGNOSIS — R7303 Prediabetes: Secondary | ICD-10-CM

## 2018-10-30 DIAGNOSIS — N400 Enlarged prostate without lower urinary tract symptoms: Secondary | ICD-10-CM | POA: Diagnosis not present

## 2018-10-30 DIAGNOSIS — Z0000 Encounter for general adult medical examination without abnormal findings: Secondary | ICD-10-CM

## 2018-10-30 NOTE — Progress Notes (Signed)
Subjective:    Patient ID: Mike Davila, male    DOB: [birth year], 73 y.o.   MRN: [id number]  HPI  Patient is a 73 year old Caucasian male here today for complete physical exam.  Patient states that he had a colonoscopy performed a year ago that was normal and therefore this is up-to-date.  He is requesting a PSA for prostate cancer screening.  He is due for a flu shot.  He is due for Pneumovax 23.  He is due for Prevnar 13.  He is due for the shingles vaccine.  Patient politely declines all of these vaccines.  He had a bad reaction previously to tetanus and despite my encouragement, he does not want to try any other vaccines.  He is due for lab work including a CBC, CMP, fasting lipid panel, PSA.  He denies any problems with depression.  He is still employed and farms every day.  He denies any problems with falling.  He denies any problems performing his ADLs and is still physically active on the farm. Past Medical History:  Diagnosis Date  . Anemia   . Benign localized prostatic hyperplasia with lower urinary tract symptoms (LUTS)   . CKD (chronic kidney disease), stage III [facility])    nephrologist-  dr Hinda Lenis ([facility] in [place])-- per pt last visit 06/ 2019  . Diverticulosis of colon   . ED (erectile dysfunction)   . History of acute gouty arthritis 10/23/2017  . History of acute renal failure 10/23/2017   hospital admission-- w/ acute tubular necrosis superimposed on ckd 3--- resolved  . History of diverticulitis of colon 10/05/2017   admission-- mild-- resolved w/ medical management  . History of metabolic acidosis 67/67/2094   admission-- resolved  . History of palpitations    approx. 2009, per pt no issue since  . History of urinary retention 10/2017  . Hypertension   . Incomplete right bundle branch block   . Mixed hyperlipidemia   . Nephrolithiasis    left side nonobstructive  . Pre-diabetes   . Right ureteral stone   . Wears dentures    upper    We discussed his  history of acute tubular necrosis.  He still occasionally takes Advil for low back pain.  He does this occasionally.  I explained to the patient that NSAIDs are toxic to the kidney potentially and that he should avoid this.  He does have a history of prediabetes and he is due to recheck his blood sugar and a hemoglobin A1c. Past Surgical History:  Procedure Laterality Date  . ANKLE SURGERY Bilateral 1990s to early 2000s   removal bone spurs  . APPENDECTOMY  child  . COLONOSCOPY N/A 10/27/2017   Procedure: COLONOSCOPY;  Surgeon: Rogene Houston, MD;  [facility];  Service: Endoscopy;  Laterality: N/A;  . CYSTOSCOPY/URETEROSCOPY/HOLMIUM LASER/STENT PLACEMENT Right 05/09/2018   Procedure: CYSTOSCOPY RIGHT URETEROSCOPY/HOLMIUM LASER/STENT PLACEMENT;  Surgeon: Ardis Hughs, MD;  Location: [facility];  Service: Urology;  Laterality: Right;  . HEMATOMA EVACUATION  1990s   right calf  . INGUINAL HERNIA REPAIR Bilateral age 27s  . PLANTAR FASCIA SURGERY Left 2004  . TONSILLECTOMY  child  . TRANSTHORACIC ECHOCARDIOGRAM  10/24/2017   ef 60-65%,  grade 1 diastolic dysfunction   Current Outpatient Medications on File Prior to Visit  Medication Sig Dispense Refill  . alfuzosin (UROXATRAL) 10 MG 24 hr tablet Take 10 mg by mouth daily with breakfast.    . furosemide (LASIX) 20  MG tablet Take 20 mg by mouth.    Marland Kitchen ibuprofen (ADVIL,MOTRIN) 200 MG tablet Take 200 mg by mouth every 6 (six) hours as needed.    . metoprolol succinate (TOPROL-XL) 25 MG 24 hr tablet TAKE ONE TABLET BY MOUTH DAILY. 90 tablet 0   No current facility-administered medications on file prior to visit.    Allergies  Allergen Reactions  . Sulfa Antibiotics Hives and Shortness Of Breath  . Crestor [Rosuvastatin Calcium] Other (See Comments)    Leg cramps  . Lipitor [Atorvastatin Calcium] Other (See Comments)    Leg cramps  . Tetanus Toxoids Other (See Comments)    Unknown reaction   Social History    Socioeconomic History  . Marital status: Single    Spouse name: Not on file  . Number of children: Not on file  . Years of education: Not on file  . Highest education level: Not on file  Occupational History  . Not on file  Social Needs  . Financial resource strain: Not on file  . Food insecurity:    Worry: Not on file    Inability: Not on file  . Transportation needs:    Medical: Not on file    Non-medical: Not on file  Tobacco Use  . Smoking status: Former Smoker    Years: 20.00    Types: Cigarettes    Last attempt to quit: 05/04/2006    Years since quitting: 12.4  . Smokeless tobacco: Former Systems developer    Types: [place] date: 05/04/2017  Substance and Sexual Activity  . Alcohol use: No  . Drug use: No  . Sexual activity: Yes  Lifestyle  . Physical activity:    Days per week: Not on file    Minutes per session: Not on file  . Stress: Not on file  Relationships  . Social connections:    Talks on phone: Not on file    Gets together: Not on file    Attends religious service: Not on file    Active member of club or organization: Not on file    Attends meetings of clubs or organizations: Not on file    Relationship status: Not on file  . Intimate partner violence:    Fear of current or ex partner: Not on file    Emotionally abused: Not on file    Physically abused: Not on file    Forced sexual activity: Not on file  Other Topics Concern  . Not on file  Social History Narrative   Married.   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   Family History  Problem Relation Age of Onset  . Thyroid cancer Mother   . Heart disease Father        MI  . Colon cancer Father   . Melanoma Father   . Benign prostatic hyperplasia Brother   . Diabetes Mellitus II Maternal Grandmother   . Ovarian cancer Daughter     Review of Systems  All other systems reviewed and are negative.      Objective:   Physical Exam Vitals signs reviewed.  Constitutional:      General: He is not in acute distress.     Appearance: Normal appearance. He is normal weight. He is not ill-appearing, toxic-appearing or diaphoretic.  HENT:     Head: Normocephalic and atraumatic.     Right Ear: Tympanic membrane, ear canal and external ear normal.     Left Ear: Tympanic membrane, ear canal and external ear normal.  Nose: Nose normal. No congestion or rhinorrhea.     Mouth/Throat:     Mouth: Mucous membranes are moist.     Pharynx: No oropharyngeal exudate or posterior oropharyngeal erythema.  Eyes:     General: No scleral icterus.       Right eye: No discharge.        Left eye: No discharge.     Extraocular Movements: Extraocular movements intact.     Conjunctiva/sclera: Conjunctivae normal.     Pupils: Pupils are equal, round, and reactive to light.  Neck:     Musculoskeletal: Normal range of motion and neck supple. No neck rigidity or muscular tenderness.     Vascular: No carotid bruit.  Cardiovascular:     Rate and Rhythm: Normal rate and regular rhythm.     Pulses: Normal pulses.     Heart sounds: Normal heart sounds. No murmur. No friction rub. No gallop.   Pulmonary:     Effort: Pulmonary effort is normal. No respiratory distress.     Breath sounds: Normal breath sounds. No stridor. No wheezing, rhonchi or rales.  Chest:     Chest wall: No tenderness.  Abdominal:     General: Abdomen is flat. Bowel sounds are normal. There is no distension.     Palpations: Abdomen is soft. There is no mass.     Tenderness: There is no abdominal tenderness. There is no right CVA tenderness, left CVA tenderness, guarding or rebound.     Hernia: No hernia is present.  Musculoskeletal: Normal range of motion.        General: No swelling or tenderness.     Right lower leg: No edema.     Left lower leg: No edema.  Lymphadenopathy:     Cervical: No cervical adenopathy.  Skin:    General: Skin is warm.     Coloration: Skin is not jaundiced or pale.     Findings: No bruising, erythema, lesion or rash.    Neurological:     General: No focal deficit present.     Mental Status: He is alert and oriented to person, place, and time. Mental status is at baseline.     Cranial Nerves: No cranial nerve deficit.     Motor: No weakness.     Coordination: Coordination normal.     Gait: Gait normal.     Deep Tendon Reflexes: Reflexes normal.  Psychiatric:        Mood and Affect: Mood normal.        Behavior: Behavior normal.        Thought Content: Thought content normal.        Judgment: Judgment normal.   Patient declines digital rectal exam        Assessment & Plan:  Prediabetes - Plan: Hemoglobin A1c, CBC with Differential/Platelet, COMPLETE METABOLIC PANEL WITH GFR, Lipid panel  Benign essential HTN - Plan: COMPLETE METABOLIC PANEL WITH GFR, Lipid panel  Benign prostatic hyperplasia without lower urinary tract symptoms  Prostate cancer screening - Plan: PSA  General medical exam - Plan: Hemoglobin A1c, CBC with Differential/Platelet, COMPLETE METABOLIC PANEL WITH GFR, Lipid panel, PSA  Physical exam today is normal.  Recommended Pneumovax 23, flu shot, Prevnar 13, Shingrix.  Patient politely declines all of these vaccines.  I will check his prediabetes with a hemoglobin A1c.  I will also monitor her fasting lipid panel.  Ideally I like his LDL cholesterol below 100.  Monitor his renal function given his history of hypertension with  a CMP as well as a lipid panel.  Blood pressure today is borderline however given his age I believe this is acceptable.  Given his history of BPH and his age, I will screen for prostate cancer with a PSA.  He declines digital rectal exam.  Colonoscopy is up-to-date.  Regular anticipatory guidance is provided.

## 2018-10-31 LAB — CBC WITH DIFFERENTIAL/PLATELET
ABSOLUTE MONOCYTES: 470 {cells}/uL (ref 200–950)
BASOS ABS: 62 {cells}/uL (ref 0–200)
BASOS PCT: 1.1 %
EOS ABS: 129 {cells}/uL (ref 15–500)
Eosinophils Relative: 2.3 %
HCT: 40.5 % (ref 38.5–50.0)
Hemoglobin: 13.7 g/dL (ref 13.2–17.1)
Lymphs Abs: 1215 cells/uL (ref 850–3900)
MCH: 28.7 pg (ref 27.0–33.0)
MCHC: 33.8 g/dL (ref 32.0–36.0)
MCV: 84.7 fL (ref 80.0–100.0)
MONOS PCT: 8.4 %
MPV: 10.3 fL (ref 7.5–12.5)
Neutro Abs: 3724 cells/uL (ref 1500–7800)
Neutrophils Relative %: 66.5 %
Platelets: 226 10*3/uL (ref 140–400)
RBC: 4.78 10*6/uL (ref 4.20–5.80)
RDW: 14.9 % (ref 11.0–15.0)
TOTAL LYMPHOCYTE: 21.7 %
WBC: 5.6 10*3/uL (ref 3.8–10.8)

## 2018-10-31 LAB — COMPLETE METABOLIC PANEL WITH GFR
AG Ratio: 1.6 (calc) (ref 1.0–2.5)
ALBUMIN MSPROF: 4.5 g/dL (ref 3.6–5.1)
ALT: 22 U/L (ref 9–46)
AST: 17 U/L (ref 10–35)
Alkaline phosphatase (APISO): 69 U/L (ref 40–115)
BUN / CREAT RATIO: 11 (calc) (ref 6–22)
BUN: 24 mg/dL (ref 7–25)
CALCIUM: 10 mg/dL (ref 8.6–10.3)
CO2: 30 mmol/L (ref 20–32)
Chloride: 99 mmol/L (ref 98–110)
Creat: 2.26 mg/dL — ABNORMAL HIGH (ref 0.70–1.18)
GFR, EST AFRICAN AMERICAN: 33 mL/min/{1.73_m2} — AB (ref >=60)
GFR, EST NON AFRICAN AMERICAN: 28 mL/min/{1.73_m2} — AB (ref >=60)
GLOBULIN: 2.8 g/dL (ref 1.9–3.7)
Glucose, Bld: 114 mg/dL — ABNORMAL HIGH (ref 65–99)
Potassium: 3.3 mmol/L — ABNORMAL LOW (ref 3.5–5.3)
SODIUM: 141 mmol/L (ref 135–146)
TOTAL PROTEIN: 7.3 g/dL (ref 6.1–8.1)
Total Bilirubin: 0.7 mg/dL (ref 0.2–1.2)

## 2018-10-31 LAB — HEMOGLOBIN A1C
HEMOGLOBIN A1C: 5.7 %{Hb} — AB (ref <5.7)
Mean Plasma Glucose: 117 (calc)
eAG (mmol/L): 6.5 (calc)

## 2018-10-31 LAB — LIPID PANEL
CHOLESTEROL: 209 mg/dL — AB (ref <200)
HDL: 31 mg/dL — ABNORMAL LOW (ref >40)
LDL Cholesterol (Calc): 134 mg/dL (calc) — ABNORMAL HIGH
Non-HDL Cholesterol (Calc): 178 mg/dL (calc) — ABNORMAL HIGH (ref <130)
Total CHOL/HDL Ratio: 6.7 (calc) — ABNORMAL HIGH (ref <5.0)
Triglycerides: 308 mg/dL — ABNORMAL HIGH (ref <150)

## 2018-10-31 LAB — PSA: PSA: 2.7 ng/mL (ref <=4.0)

## 2018-11-01 DIAGNOSIS — E559 Vitamin D deficiency, unspecified: Secondary | ICD-10-CM | POA: Diagnosis not present

## 2018-11-01 DIAGNOSIS — I10 Essential (primary) hypertension: Secondary | ICD-10-CM | POA: Diagnosis not present

## 2018-11-01 DIAGNOSIS — D509 Iron deficiency anemia, unspecified: Secondary | ICD-10-CM | POA: Diagnosis not present

## 2018-11-01 DIAGNOSIS — Z1159 Encounter for screening for other viral diseases: Secondary | ICD-10-CM | POA: Diagnosis not present

## 2018-11-01 DIAGNOSIS — Z79899 Other long term (current) drug therapy: Secondary | ICD-10-CM | POA: Diagnosis not present

## 2018-11-01 DIAGNOSIS — R809 Proteinuria, unspecified: Secondary | ICD-10-CM | POA: Diagnosis not present

## 2018-11-01 DIAGNOSIS — N183 Chronic kidney disease, stage 3 (moderate): Secondary | ICD-10-CM | POA: Diagnosis not present

## 2018-11-02 ENCOUNTER — Other Ambulatory Visit: Payer: Self-pay | Admitting: Family Medicine

## 2018-11-02 MED ORDER — POTASSIUM CHLORIDE CRYS ER 20 MEQ PO TBCR: 20.0000 meq | EXTENDED_RELEASE_TABLET | Freq: Every day | ORAL | 0 refills | Status: DC

## 2018-11-02 MED ORDER — ATORVASTATIN CALCIUM 20 MG PO TABS: 20.0000 mg | ORAL_TABLET | Freq: Every day | ORAL | 1 refills | Status: DC

## 2018-11-07 ENCOUNTER — Other Ambulatory Visit: Payer: Self-pay | Admitting: Family Medicine

## 2018-11-07 MED ORDER — POTASSIUM CHLORIDE CRYS ER 20 MEQ PO TBCR: 20.0000 meq | EXTENDED_RELEASE_TABLET | Freq: Every day | ORAL | 0 refills | Status: DC

## 2018-11-07 MED ORDER — ATORVASTATIN CALCIUM 20 MG PO TABS: 20.0000 mg | ORAL_TABLET | Freq: Every day | ORAL | 1 refills | Status: DC

## 2018-12-24 ENCOUNTER — Other Ambulatory Visit: Payer: Self-pay | Admitting: Family Medicine

## 2018-12-24 MED ORDER — METOPROLOL SUCCINATE ER 25 MG PO TB24: 25.0000 mg | ORAL_TABLET | Freq: Every day | ORAL | 2 refills | Status: DC

## 2019-02-26 ENCOUNTER — Telehealth: Payer: Self-pay | Admitting: Family Medicine

## 2019-02-26 ENCOUNTER — Other Ambulatory Visit: Payer: Self-pay | Admitting: Family Medicine

## 2019-02-26 MED ORDER — CIPROFLOXACIN HCL 500 MG PO TABS: 500.0000 mg | ORAL_TABLET | Freq: Two times a day (BID) | ORAL | 0 refills | Status: DC

## 2019-02-26 NOTE — Telephone Encounter (Signed)
Spoke with patient and informed him per Dr.Pickard that an antibiotic has been sent into the pharmacy. Patient verbalized understanding.

## 2019-02-26 NOTE — Telephone Encounter (Signed)
Pt called [facility] wanting an antibx for UTI - he states that he is having low back pain and urine is very dark in color when he first urinates with some pain.

## 2019-02-26 NOTE — Telephone Encounter (Signed)
I sent cipro to [facility] for his uti.

## 2019-06-24 DIAGNOSIS — N200 Calculus of kidney: Secondary | ICD-10-CM | POA: Diagnosis not present

## 2019-06-24 DIAGNOSIS — N401 Enlarged prostate with lower urinary tract symptoms: Secondary | ICD-10-CM | POA: Diagnosis not present

## 2019-06-24 DIAGNOSIS — R3912 Poor urinary stream: Secondary | ICD-10-CM | POA: Diagnosis not present

## 2019-06-24 DIAGNOSIS — N5201 Erectile dysfunction due to arterial insufficiency: Secondary | ICD-10-CM | POA: Diagnosis not present

## 2019-09-24 ENCOUNTER — Other Ambulatory Visit: Payer: Self-pay | Admitting: Family Medicine

## 2019-12-21 ENCOUNTER — Other Ambulatory Visit: Payer: Self-pay | Admitting: Family Medicine

## 2020-01-02 ENCOUNTER — Inpatient Hospital Stay (HOSPITAL_COMMUNITY)
Admission: EM | Admit: 2020-01-02 | Discharge: 2020-01-14 | DRG: 177 | Disposition: A | Discharge status: Home Health | Payer: Medicare Other | Attending: Internal Medicine | Admitting: Internal Medicine

## 2020-01-02 ENCOUNTER — Other Ambulatory Visit: Payer: Self-pay

## 2020-01-02 ENCOUNTER — Emergency Department (HOSPITAL_COMMUNITY): Payer: Medicare Other

## 2020-01-02 DIAGNOSIS — Z888 Allergy status to other drugs, medicaments and biological substances status: Secondary | ICD-10-CM

## 2020-01-02 DIAGNOSIS — I129 Hypertensive chronic kidney disease with stage 1 through stage 4 chronic kidney disease, or unspecified chronic kidney disease: Secondary | ICD-10-CM | POA: Diagnosis present

## 2020-01-02 DIAGNOSIS — E1122 Type 2 diabetes mellitus with diabetic chronic kidney disease: Secondary | ICD-10-CM | POA: Diagnosis present

## 2020-01-02 DIAGNOSIS — Z87891 Personal history of nicotine dependence: Secondary | ICD-10-CM | POA: Diagnosis not present

## 2020-01-02 DIAGNOSIS — N170 Acute kidney failure with tubular necrosis: Secondary | ICD-10-CM | POA: Diagnosis not present

## 2020-01-02 DIAGNOSIS — Z887 Allergy status to serum and vaccine status: Secondary | ICD-10-CM

## 2020-01-02 DIAGNOSIS — K5730 Diverticulosis of large intestine without perforation or abscess without bleeding: Secondary | ICD-10-CM | POA: Diagnosis present

## 2020-01-02 DIAGNOSIS — E6609 Other obesity due to excess calories: Secondary | ICD-10-CM | POA: Diagnosis present

## 2020-01-02 DIAGNOSIS — U071 COVID-19: Secondary | ICD-10-CM | POA: Diagnosis present

## 2020-01-02 DIAGNOSIS — N189 Chronic kidney disease, unspecified: Secondary | ICD-10-CM | POA: Diagnosis not present

## 2020-01-02 DIAGNOSIS — F419 Anxiety disorder, unspecified: Secondary | ICD-10-CM | POA: Diagnosis not present

## 2020-01-02 DIAGNOSIS — N400 Enlarged prostate without lower urinary tract symptoms: Secondary | ICD-10-CM | POA: Diagnosis present

## 2020-01-02 DIAGNOSIS — R251 Tremor, unspecified: Secondary | ICD-10-CM | POA: Diagnosis present

## 2020-01-02 DIAGNOSIS — R7303 Prediabetes: Secondary | ICD-10-CM | POA: Diagnosis not present

## 2020-01-02 DIAGNOSIS — M7989 Other specified soft tissue disorders: Secondary | ICD-10-CM

## 2020-01-02 DIAGNOSIS — Z8249 Family history of ischemic heart disease and other diseases of the circulatory system: Secondary | ICD-10-CM | POA: Diagnosis not present

## 2020-01-02 DIAGNOSIS — R0902 Hypoxemia: Secondary | ICD-10-CM | POA: Diagnosis not present

## 2020-01-02 DIAGNOSIS — Z6833 Body mass index (BMI) 33.0-33.9, adult: Secondary | ICD-10-CM | POA: Diagnosis not present

## 2020-01-02 DIAGNOSIS — J984 Other disorders of lung: Secondary | ICD-10-CM | POA: Diagnosis not present

## 2020-01-02 DIAGNOSIS — I10 Essential (primary) hypertension: Secondary | ICD-10-CM | POA: Diagnosis present

## 2020-01-02 DIAGNOSIS — Z833 Family history of diabetes mellitus: Secondary | ICD-10-CM

## 2020-01-02 DIAGNOSIS — Z808 Family history of malignant neoplasm of other organs or systems: Secondary | ICD-10-CM

## 2020-01-02 DIAGNOSIS — N184 Chronic kidney disease, stage 4 (severe): Secondary | ICD-10-CM | POA: Diagnosis present

## 2020-01-02 DIAGNOSIS — J9601 Acute respiratory failure with hypoxia: Secondary | ICD-10-CM | POA: Diagnosis present

## 2020-01-02 DIAGNOSIS — N179 Acute kidney failure, unspecified: Secondary | ICD-10-CM | POA: Diagnosis not present

## 2020-01-02 DIAGNOSIS — N183 Chronic kidney disease, stage 3 unspecified: Secondary | ICD-10-CM

## 2020-01-02 DIAGNOSIS — D631 Anemia in chronic kidney disease: Secondary | ICD-10-CM | POA: Diagnosis not present

## 2020-01-02 DIAGNOSIS — E1165 Type 2 diabetes mellitus with hyperglycemia: Secondary | ICD-10-CM | POA: Diagnosis not present

## 2020-01-02 DIAGNOSIS — Z882 Allergy status to sulfonamides status: Secondary | ICD-10-CM

## 2020-01-02 DIAGNOSIS — E782 Mixed hyperlipidemia: Secondary | ICD-10-CM | POA: Diagnosis present

## 2020-01-02 DIAGNOSIS — E875 Hyperkalemia: Secondary | ICD-10-CM | POA: Diagnosis not present

## 2020-01-02 DIAGNOSIS — I4510 Unspecified right bundle-branch block: Secondary | ICD-10-CM | POA: Diagnosis present

## 2020-01-02 DIAGNOSIS — J9600 Acute respiratory failure, unspecified whether with hypoxia or hypercapnia: Secondary | ICD-10-CM | POA: Diagnosis not present

## 2020-01-02 DIAGNOSIS — R531 Weakness: Secondary | ICD-10-CM | POA: Diagnosis not present

## 2020-01-02 DIAGNOSIS — R05 Cough: Secondary | ICD-10-CM | POA: Diagnosis not present

## 2020-01-02 DIAGNOSIS — N19 Unspecified kidney failure: Secondary | ICD-10-CM | POA: Diagnosis not present

## 2020-01-02 DIAGNOSIS — R0602 Shortness of breath: Secondary | ICD-10-CM | POA: Diagnosis not present

## 2020-01-02 DIAGNOSIS — J1282 Pneumonia due to coronavirus disease 2019: Secondary | ICD-10-CM | POA: Diagnosis present

## 2020-01-02 DIAGNOSIS — R918 Other nonspecific abnormal finding of lung field: Secondary | ICD-10-CM | POA: Diagnosis not present

## 2020-01-02 DIAGNOSIS — Z800 Family history of malignant neoplasm of digestive organs: Secondary | ICD-10-CM

## 2020-01-02 DIAGNOSIS — I82409 Acute embolism and thrombosis of unspecified deep veins of unspecified lower extremity: Secondary | ICD-10-CM

## 2020-01-02 LAB — COMPREHENSIVE METABOLIC PANEL
ALT: 41 U/L (ref 0–44)
AST: 40 U/L (ref 15–41)
Albumin: 3.7 g/dL (ref 3.5–5.0)
Alkaline Phosphatase: 57 U/L (ref 38–126)
Anion gap: 10 (ref 5–15)
BUN: 27 mg/dL — ABNORMAL HIGH (ref 8–23)
CO2: 21 mmol/L — ABNORMAL LOW (ref 22–32)
Calcium: 9 mg/dL (ref 8.9–10.3)
Chloride: 105 mmol/L (ref 98–111)
Creatinine, Ser: 2.65 mg/dL — ABNORMAL HIGH (ref 0.61–1.24)
GFR calc Af Amer: 27 mL/min — ABNORMAL LOW (ref >60)
GFR calc non Af Amer: 23 mL/min — ABNORMAL LOW (ref >60)
Glucose, Bld: 130 mg/dL — ABNORMAL HIGH (ref 70–99)
Potassium: 3.6 mmol/L (ref 3.5–5.1)
Sodium: 136 mmol/L (ref 135–145)
Total Bilirubin: 0.6 mg/dL (ref 0.3–1.2)
Total Protein: 7.8 g/dL (ref 6.5–8.1)

## 2020-01-02 LAB — CBC WITH DIFFERENTIAL/PLATELET
Abs Immature Granulocytes: 0.01 10*3/uL (ref 0.00–0.07)
Basophils Absolute: 0 10*3/uL (ref 0.0–0.1)
Basophils Relative: 0 %
Eosinophils Absolute: 0 10*3/uL (ref 0.0–0.5)
Eosinophils Relative: 0 %
HCT: 40 % (ref 39.0–52.0)
Hemoglobin: 12.5 g/dL — ABNORMAL LOW (ref 13.0–17.0)
Immature Granulocytes: 0 %
Lymphocytes Relative: 12 %
Lymphs Abs: 0.5 10*3/uL — ABNORMAL LOW (ref 0.7–4.0)
MCH: 26.8 pg (ref 26.0–34.0)
MCHC: 31.3 g/dL (ref 30.0–36.0)
MCV: 85.7 fL (ref 80.0–100.0)
Monocytes Absolute: 0.4 10*3/uL (ref 0.1–1.0)
Monocytes Relative: 10 %
Neutro Abs: 2.9 10*3/uL (ref 1.7–7.7)
Neutrophils Relative %: 78 %
Platelets: 173 10*3/uL (ref 150–400)
RBC: 4.67 MIL/uL (ref 4.22–5.81)
RDW: 15.1 % (ref 11.5–15.5)
WBC: 3.7 10*3/uL — ABNORMAL LOW (ref 4.0–10.5)
nRBC: 0 % (ref 0.0–0.2)

## 2020-01-02 LAB — TRIGLYCERIDES: Triglycerides: 215 mg/dL — ABNORMAL HIGH (ref <150)

## 2020-01-02 LAB — PROCALCITONIN: Procalcitonin: 0.35 ng/mL

## 2020-01-02 LAB — LACTIC ACID, PLASMA: Lactic Acid, Venous: 0.9 mmol/L (ref 0.5–1.9)

## 2020-01-02 LAB — HEMOGLOBIN A1C
Hgb A1c MFr Bld: 6.1 % — ABNORMAL HIGH (ref 4.8–5.6)
Mean Plasma Glucose: 128.37 mg/dL

## 2020-01-02 LAB — D-DIMER, QUANTITATIVE: D-Dimer, Quant: 1.28 ug/mL-FEU — ABNORMAL HIGH (ref 0.00–0.50)

## 2020-01-02 LAB — FIBRINOGEN: Fibrinogen: 789 mg/dL — ABNORMAL HIGH (ref 210–475)

## 2020-01-02 LAB — POC SARS CORONAVIRUS 2 AG -  ED: SARS Coronavirus 2 Ag: NEGATIVE

## 2020-01-02 LAB — ABO/RH: ABO/RH(D): O POS

## 2020-01-02 LAB — LACTATE DEHYDROGENASE: LDH: 193 U/L — ABNORMAL HIGH (ref 98–192)

## 2020-01-02 LAB — C-REACTIVE PROTEIN: CRP: 16.1 mg/dL — ABNORMAL HIGH (ref <1.0)

## 2020-01-02 LAB — GLUCOSE, CAPILLARY: Glucose-Capillary: 118 mg/dL — ABNORMAL HIGH (ref 70–99)

## 2020-01-02 LAB — FERRITIN: Ferritin: 1074 ng/mL — ABNORMAL HIGH (ref 24–336)

## 2020-01-02 MED ORDER — DEXAMETHASONE SODIUM PHOSPHATE 10 MG/ML IJ SOLN
10.0000 mg | Freq: Once | INTRAMUSCULAR | Status: AC
  Administered 2020-01-02: 10 mg via INTRAVENOUS
  Filled 2020-01-02: qty 1

## 2020-01-02 MED ORDER — DEXAMETHASONE SODIUM PHOSPHATE 10 MG/ML IJ SOLN
6.0000 mg | INTRAMUSCULAR | Status: AC
  Administered 2020-01-03 – 2020-01-11 (×9): 6 mg via INTRAVENOUS
  Filled 2020-01-02 (×9): qty 1

## 2020-01-02 MED ORDER — INSULIN ASPART 100 UNIT/ML ~~LOC~~ SOLN
0.0000 [IU] | Freq: Every day | SUBCUTANEOUS | Status: DC
  Administered 2020-01-07 – 2020-01-09 (×3): 2 [IU] via SUBCUTANEOUS
  Administered 2020-01-10: 3 [IU] via SUBCUTANEOUS
  Administered 2020-01-11: 2 [IU] via SUBCUTANEOUS

## 2020-01-02 MED ORDER — GUAIFENESIN-DM 100-10 MG/5ML PO SYRP
10.0000 mL | ORAL_SOLUTION | ORAL | Status: DC | PRN
  Administered 2020-01-02 – 2020-01-13 (×6): 10 mL via ORAL
  Filled 2020-01-02 (×6): qty 10

## 2020-01-02 MED ORDER — ALFUZOSIN HCL ER 10 MG PO TB24
10.0000 mg | ORAL_TABLET | Freq: Every day | ORAL | Status: DC
  Administered 2020-01-03 – 2020-01-04 (×2): 10 mg via ORAL
  Filled 2020-01-02 (×4): qty 1

## 2020-01-02 MED ORDER — ENOXAPARIN SODIUM 40 MG/0.4ML ~~LOC~~ SOLN: 40.0000 mg | SUBCUTANEOUS | Status: DC

## 2020-01-02 MED ORDER — ONDANSETRON HCL 4 MG/2ML IJ SOLN
4.0000 mg | Freq: Four times a day (QID) | INTRAMUSCULAR | Status: DC | PRN
  Administered 2020-01-02: 4 mg via INTRAVENOUS
  Filled 2020-01-02: qty 2

## 2020-01-02 MED ORDER — SODIUM CHLORIDE 0.9 % IV SOLN
1.0000 g | INTRAVENOUS | Status: DC
  Administered 2020-01-02 – 2020-01-04 (×3): 1 g via INTRAVENOUS
  Filled 2020-01-02 (×3): qty 10

## 2020-01-02 MED ORDER — ALBUTEROL SULFATE HFA 108 (90 BASE) MCG/ACT IN AERS
2.0000 | INHALATION_SPRAY | Freq: Four times a day (QID) | RESPIRATORY_TRACT | Status: DC
  Administered 2020-01-02 – 2020-01-11 (×32): 2 via RESPIRATORY_TRACT
  Filled 2020-01-02 (×2): qty 6.7

## 2020-01-02 MED ORDER — ONDANSETRON HCL 4 MG PO TABS: 4.0000 mg | ORAL_TABLET | Freq: Four times a day (QID) | ORAL | Status: DC | PRN

## 2020-01-02 MED ORDER — SODIUM CHLORIDE 0.9 % IV SOLN
200.0000 mg | Freq: Once | INTRAVENOUS | Status: DC
  Filled 2020-01-02: qty 40

## 2020-01-02 MED ORDER — INSULIN ASPART 100 UNIT/ML ~~LOC~~ SOLN
0.0000 [IU] | Freq: Three times a day (TID) | SUBCUTANEOUS | Status: DC
  Administered 2020-01-03 (×3): 2 [IU] via SUBCUTANEOUS
  Administered 2020-01-04: 1 [IU] via SUBCUTANEOUS
  Administered 2020-01-04: 2 [IU] via SUBCUTANEOUS
  Administered 2020-01-04: 1 [IU] via SUBCUTANEOUS
  Administered 2020-01-05: 2 [IU] via SUBCUTANEOUS
  Administered 2020-01-05 (×2): 1 [IU] via SUBCUTANEOUS
  Administered 2020-01-06 (×2): 2 [IU] via SUBCUTANEOUS
  Administered 2020-01-06: 1 [IU] via SUBCUTANEOUS
  Administered 2020-01-07 (×3): 2 [IU] via SUBCUTANEOUS
  Administered 2020-01-08: 7 [IU] via SUBCUTANEOUS
  Administered 2020-01-08: 5 [IU] via SUBCUTANEOUS
  Administered 2020-01-08: 2 [IU] via SUBCUTANEOUS
  Administered 2020-01-09: 7 [IU] via SUBCUTANEOUS
  Administered 2020-01-09: 3 [IU] via SUBCUTANEOUS
  Administered 2020-01-09: 2 [IU] via SUBCUTANEOUS
  Administered 2020-01-10: 1 [IU] via SUBCUTANEOUS
  Administered 2020-01-10 (×2): 3 [IU] via SUBCUTANEOUS
  Administered 2020-01-11: 9 [IU] via SUBCUTANEOUS
  Administered 2020-01-11: 3 [IU] via SUBCUTANEOUS
  Administered 2020-01-11 – 2020-01-12 (×4): 2 [IU] via SUBCUTANEOUS
  Administered 2020-01-13: 1 [IU] via SUBCUTANEOUS
  Administered 2020-01-13 (×2): 2 [IU] via SUBCUTANEOUS
  Administered 2020-01-14: 3 [IU] via SUBCUTANEOUS
  Administered 2020-01-14: 1 [IU] via SUBCUTANEOUS

## 2020-01-02 MED ORDER — ASCORBIC ACID 500 MG PO TABS
500.0000 mg | ORAL_TABLET | Freq: Every day | ORAL | Status: DC
  Administered 2020-01-02 – 2020-01-14 (×13): 500 mg via ORAL
  Filled 2020-01-02 (×13): qty 1

## 2020-01-02 MED ORDER — HEPARIN SODIUM (PORCINE) 10000 UNIT/ML IJ SOLN: 7500.0000 [IU] | Freq: Three times a day (TID) | INTRAMUSCULAR | Status: DC

## 2020-01-02 MED ORDER — ACETAMINOPHEN 325 MG PO TABS
650.0000 mg | ORAL_TABLET | Freq: Four times a day (QID) | ORAL | Status: DC | PRN
  Administered 2020-01-02 – 2020-01-12 (×5): 650 mg via ORAL
  Filled 2020-01-02 (×6): qty 2

## 2020-01-02 MED ORDER — ZINC SULFATE 220 (50 ZN) MG PO CAPS
220.0000 mg | ORAL_CAPSULE | Freq: Every day | ORAL | Status: DC
  Administered 2020-01-02 – 2020-01-14 (×13): 220 mg via ORAL
  Filled 2020-01-02 (×13): qty 1

## 2020-01-02 MED ORDER — SODIUM CHLORIDE 0.9 % IV SOLN
100.0000 mg | INTRAVENOUS | Status: AC
  Administered 2020-01-02 – 2020-01-03 (×2): 100 mg via INTRAVENOUS
  Filled 2020-01-02 (×2): qty 20

## 2020-01-02 MED ORDER — POLYETHYLENE GLYCOL 3350 17 G PO PACK: 17.0000 g | PACK | Freq: Every day | ORAL | Status: DC | PRN

## 2020-01-02 MED ORDER — SODIUM CHLORIDE 0.9 % IV SOLN
500.0000 mg | INTRAVENOUS | Status: DC
  Administered 2020-01-02 – 2020-01-04 (×3): 500 mg via INTRAVENOUS
  Filled 2020-01-02 (×3): qty 500

## 2020-01-02 MED ORDER — METOPROLOL SUCCINATE ER 25 MG PO TB24
25.0000 mg | ORAL_TABLET | Freq: Every day | ORAL | Status: DC
  Administered 2020-01-02 – 2020-01-04 (×3): 25 mg via ORAL
  Filled 2020-01-02 (×3): qty 1

## 2020-01-02 MED ORDER — HEPARIN SODIUM (PORCINE) 10000 UNIT/ML IJ SOLN
7500.0000 [IU] | Freq: Three times a day (TID) | INTRAMUSCULAR | Status: DC
  Filled 2020-01-02 (×3): qty 1

## 2020-01-02 MED ORDER — HEPARIN SODIUM (PORCINE) 5000 UNIT/ML IJ SOLN
7500.0000 [IU] | Freq: Three times a day (TID) | INTRAMUSCULAR | Status: DC
  Administered 2020-01-02 – 2020-01-14 (×36): 7500 [IU] via SUBCUTANEOUS
  Filled 2020-01-02 (×38): qty 2

## 2020-01-02 MED ORDER — SODIUM CHLORIDE 0.9 % IV SOLN
100.0000 mg | Freq: Every day | INTRAVENOUS | Status: AC
  Administered 2020-01-03 – 2020-01-06 (×4): 100 mg via INTRAVENOUS
  Filled 2020-01-02: qty 20
  Filled 2020-01-02 (×2): qty 100
  Filled 2020-01-02 (×2): qty 20

## 2020-01-02 NOTE — ED Provider Notes (Addendum)
[facility] Provider Note   CSN: [id number] Arrival date & time: 01/02/20  1519     History Chief Complaint  Patient presents with  . Shortness of Breath    Possible COVID +    Mike Davila is a 74 y.o. male.  HPI   Patient is a 74 year old male, known history of chronic kidney disease, history of some urinary retention, he is a prediabetic.  He presents with severe shortness of breath with increasing weakness and very poor appetite over the last couple of days.  Evidently the patient's wife has been diagnosed with coronavirus and has been home sick, he started approximately 1 week ago with respiratory symptoms and now has progressive shortness of breath cough.  Symptoms are severe, sats are 88% on room air.  Denies any other symptoms except for mild diarrhea.  Past Medical History:  Diagnosis Date  . Anemia   . Benign localized prostatic hyperplasia with lower urinary tract symptoms (LUTS)   . CKD (chronic kidney disease), stage III [facility])    nephrologist-  dr Hinda Lenis ([facility] in [place])-- per pt last visit 06/ 2019  . Diverticulosis of colon   . ED (erectile dysfunction)   . History of acute gouty arthritis 10/23/2017  . History of acute renal failure 10/23/2017   hospital admission-- w/ acute tubular necrosis superimposed on ckd 3--- resolved  . History of diverticulitis of colon 10/05/2017   admission-- mild-- resolved w/ medical management  . History of metabolic acidosis 76/19/5093   admission-- resolved  . History of palpitations    approx. 2009, per pt no issue since  . History of urinary retention 10/2017  . Hypertension   . Incomplete right bundle branch block   . Mixed hyperlipidemia   . Nephrolithiasis    left side nonobstructive  . Pre-diabetes   . Right ureteral stone   . Wears dentures    upper    Patient Active Problem List   Diagnosis Date Noted  . Acute renal failure with acute tubular necrosis superimposed on stage 3  chronic kidney disease ([place]) 10/28/2017  . Diverticulosis of colon 10/28/2017  . Weight loss, unintentional 10/28/2017  . Metabolic acidosis with normal anion gap and bicarbonate losses 10/28/2017  . Protein-calorie malnutrition, severe 10/26/2017  . Diverticulitis large intestine 10/24/2017  . Hypercalcemia 10/24/2017  . Anemia 10/23/2017  . Hyperkalemia 10/23/2017  . Screening for prostate cancer 12/23/2014  . Hyperlipidemia   . Hypertension   . Hyperglycemia   . Hypertriglyceridemia     Past Surgical History:  Procedure Laterality Date  . ANKLE SURGERY Bilateral 1990s to early 2000s   removal bone spurs  . APPENDECTOMY  child  . COLONOSCOPY N/A 10/27/2017   Procedure: COLONOSCOPY;  Surgeon: Rogene Houston, MD;  [facility];  Service: Endoscopy;  Laterality: N/A;  . CYSTOSCOPY/URETEROSCOPY/HOLMIUM LASER/STENT PLACEMENT Right 05/09/2018   Procedure: CYSTOSCOPY RIGHT URETEROSCOPY/HOLMIUM LASER/STENT PLACEMENT;  Surgeon: Ardis Hughs, MD;  Location: [facility];  Service: Urology;  Laterality: Right;  . HEMATOMA EVACUATION  1990s   right calf  . INGUINAL HERNIA REPAIR Bilateral age 48s  . PLANTAR FASCIA SURGERY Left 2004  . TONSILLECTOMY  child  . TRANSTHORACIC ECHOCARDIOGRAM  10/24/2017   ef 60-65%,  grade 1 diastolic dysfunction       Family History  Problem Relation Age of Onset  . Thyroid cancer Mother   . Heart disease Father        MI  . Colon  cancer Father   . Melanoma Father   . Benign prostatic hyperplasia Brother   . Diabetes Mellitus II Maternal Grandmother   . Ovarian cancer Daughter     Social History   Tobacco Use  . Smoking status: Former Smoker    Years: 20.00    Types: Cigarettes    Quit date: 05/04/2006    Years since quitting: 13.6  . Smokeless tobacco: Former Systems developer    Types: Chew    Quit date: 05/04/2017  Substance Use Topics  . Alcohol use: No  . Drug use: No    Home Medications Prior to Admission  medications   Medication Sig Start Date End Date Taking? Authorizing Provider  alfuzosin (UROXATRAL) 10 MG 24 hr tablet Take 10 mg by mouth daily with breakfast.    [provider]  atorvastatin (LIPITOR) 20 MG tablet Take 1 tablet (20 mg total) by mouth daily. 11/07/18   [name], MD  ciprofloxacin (CIPRO) 500 MG tablet Take 1 tablet (500 mg total) by mouth 2 (two) times daily. 02/26/19   [name], MD  furosemide (LASIX) 20 MG tablet Take 20 mg by mouth.    [provider]  ibuprofen (ADVIL,MOTRIN) 200 MG tablet Take 200 mg by mouth every 6 (six) hours as needed.    [provider]  metoprolol succinate (TOPROL-XL) 25 MG 24 hr tablet Take 1 tablet (25 mg total) by mouth daily. Needs office visit and labs before further refills 12/23/19   [name], MD  potassium chloride SA (K-DUR,KLOR-CON) 20 MEQ tablet Take 1 tablet (20 mEq total) by mouth daily for 3 days. 11/07/18 11/10/18  [name], MD    Allergies    Sulfa antibiotics, Crestor [rosuvastatin calcium], Lipitor [atorvastatin calcium], and Tetanus toxoids  Review of Systems   Review of Systems  All other systems reviewed and are negative.   Physical Exam Updated Vital Signs BP (!) 131/105   Pulse 91   Temp 100 F (37.8 C) (Oral)   Resp (!) 25   SpO2 93%   Physical Exam Vitals and nursing note reviewed.  Constitutional:      General: He is in acute distress.     Appearance: He is well-developed.  HENT:     Head: Normocephalic and atraumatic.     Mouth/Throat:     Pharynx: No oropharyngeal exudate.  Eyes:     General: No scleral icterus.       Right eye: No discharge.        Left eye: No discharge.     Conjunctiva/sclera: Conjunctivae normal.     Pupils: Pupils are equal, round, and reactive to light.  Neck:     Thyroid: No thyromegaly.     Vascular: No JVD.  Cardiovascular:     Rate and Rhythm: Normal rate and regular rhythm.     Heart sounds: Normal heart  sounds. No murmur. No friction rub. No gallop.   Pulmonary:     Effort: Pulmonary effort is normal. Tachypnea present. No respiratory distress.     Breath sounds: Rales present. No wheezing.  Abdominal:     General: Bowel sounds are normal. There is no distension.     Palpations: Abdomen is soft. There is no mass.     Tenderness: There is no abdominal tenderness.  Musculoskeletal:        General: No tenderness. Normal range of motion.     Cervical back: Normal range of motion and neck supple.  Right lower leg: Edema present.     Left lower leg: Edema present.  Lymphadenopathy:     Cervical: No cervical adenopathy.  Skin:    General: Skin is warm and dry.     Findings: No erythema or rash.  Neurological:     Mental Status: He is alert.     Coordination: Coordination normal.  Psychiatric:        Behavior: Behavior normal.     ED Results / Procedures / Treatments   Labs (all labs ordered are listed, but only abnormal results are displayed) Labs Reviewed  CBC WITH DIFFERENTIAL/PLATELET - Abnormal; Notable for the following components:      Result Value   WBC 3.7 (*)    Hemoglobin 12.5 (*)    Lymphs Abs 0.5 (*)    All other components within normal limits  COMPREHENSIVE METABOLIC PANEL - Abnormal; Notable for the following components:   CO2 21 (*)    Glucose, Bld 130 (*)    BUN 27 (*)    Creatinine, Ser 2.65 (*)    GFR calc non Af Amer 23 (*)    GFR calc Af Amer 27 (*)    All other components within normal limits  D-DIMER, QUANTITATIVE (NOT AT [facility]) - Abnormal; Notable for the following components:   D-Dimer, Quant 1.28 (*)    All other components within normal limits  LACTATE DEHYDROGENASE - Abnormal; Notable for the following components:   LDH 193 (*)    All other components within normal limits  FERRITIN - Abnormal; Notable for the following components:   Ferritin 1,074 (*)    All other components within normal limits  TRIGLYCERIDES - Abnormal; Notable for the  following components:   Triglycerides 215 (*)    All other components within normal limits  FIBRINOGEN - Abnormal; Notable for the following components:   Fibrinogen 789 (*)    All other components within normal limits  C-REACTIVE PROTEIN - Abnormal; Notable for the following components:   CRP 16.1 (*)    All other components within normal limits  CULTURE, BLOOD (ROUTINE X 2)  CULTURE, BLOOD (ROUTINE X 2)  SARS CORONAVIRUS 2 (TAT 6-24 HRS)  LACTIC ACID, PLASMA  PROCALCITONIN  POC SARS CORONAVIRUS 2 AG -  ED    EKG EKG Interpretation  Date/Time:  Thursday January 02 2020 15:34:03 EST Ventricular Rate:  97 PR Interval:    QRS Duration: 122 QT Interval:  349 QTC Calculation: 444 R Axis:   -73 Text Interpretation: Sinus rhythm RBBB and LAFB since last tracing no significant change Confirmed by Noemi Chapel [id number]) on 01/02/2020 3:55:01 PM   Radiology DG Chest Port 1 View  Result Date: 01/02/2020 CLINICAL DATA:  Cough, shortness of breath, COVID positive contact EXAM: PORTABLE CHEST 1 VIEW COMPARISON:  None. FINDINGS: The heart size and mediastinal contours are within normal limits. Extensive bilateral heterogeneous airspace opacity. The visualized skeletal structures are unremarkable. IMPRESSION: Extensive bilateral heterogeneous airspace opacity, consistent CLINICAL DATA:  Cough, shortness of breath, COVID positive contact EXAM: PORTABLE CHEST 1 VIEW COMPARISON:  None. FINDINGS: The heart size and mediastinal contours are within normal limits. Extensive bilateral heterogeneous airspace opacity. The visualized skeletal structures are unremarkable. IMPRESSION: Extensive bilateral heterogeneous airspace opacity, consistent with multifocal infection, potentially including COVID given positive contact. Electronically Signed   By: Eddie Candle M.D.   On: 01/02/2020 16:12    Procedures .Critical Care Performed by: Noemi Chapel, MD Authorized by: Noemi Chapel, MD   Critical care  provider statement:    Critical care time (minutes):  35   Critical care time was exclusive of:  Separately billable procedures and treating other patients and teaching time   Critical care was necessary to treat or prevent imminent or life-threatening deterioration of the following conditions:  Respiratory failure   Critical care was time spent personally by me on the following activities:  Blood draw for specimens, development of treatment plan with patient or surrogate, discussions with consultants, evaluation of patient's response to treatment, examination of patient, obtaining history from patient or surrogate, ordering and performing treatments and interventions, ordering and review of laboratory studies, ordering and review of radiographic studies, pulse oximetry, re-evaluation of patient's condition and review of old charts   (including critical care time)  Medications Ordered in ED Medications  dexamethasone (DECADRON) injection 10 mg (has no administration in time range)    ED Course  I have reviewed the triage vital signs and the nursing notes.  Pertinent labs & imaging results that were available during my care of the patient were reviewed by me and considered in my medical decision making (see chart for details).    MDM Rules/Calculators/A&P                       This patient is in a considerable amount of respiratory distress, he is not requiring any advanced airway techniques but is requiring supplemental oxygen for hypoxia at 88% on room air at rest.  Abnormal lung exam and history consistent with likely COVID-19.  He will need to be admitted to the hospital, oxygen will be supplemented, x-ray pending, work-up pending.  Anticipate admission.  Appears consistent with acute hypoxic respiratory failure.  Labs show low WBC, inflammatorymarkers are elevated and CXR with multifocal infiltrates c/w Covid PNA - pt to be admitted by hospitalist.  Acute hypoxic resp failure -  needing O2  Mike Davila was evaluated in Emergency Department on 01/02/2020 for the symptoms described in the history of present illness. He was evaluated in the context of the global COVID-19 pandemic, which necessitated consideration that the patient might be at risk for infection with the SARS-CoV-2 virus that causes COVID-19. Institutional protocols and algorithms that pertain to the evaluation of patients at risk for COVID-19 are in a state of rapid change based on information released by regulatory bodies including the CDC and federal and state organizations. These policies and algorithms were followed during the patient's care in the ED.   Final Clinical Impression(s) / ED Diagnoses Final diagnoses:  Pneumonia due to COVID-19 virus  Acute respiratory failure with hypoxia [facility])    Rx / DC Orders ED Discharge Orders    None       Noemi Chapel, MD 01/02/20 1744    Noemi Chapel, MD 01/02/20 1744

## 2020-01-02 NOTE — ED Triage Notes (Signed)
Pt has been more SOB since last Thursday. His wife is COVID +, but patient was never tested. 02 sats on RA 87/89%. Was placed on 2L  with sats 95%. Afebrile, cough, and tachycardic.

## 2020-01-02 NOTE — Progress Notes (Signed)
Report received from [place], [place] in ED.

## 2020-01-02 NOTE — H&P (Addendum)
History and Physical    Mike Davila:[id number] DOB: 1946/09/15 DOA: 01/02/2020  PCP: Mike Frizzle, MD   Patient coming from: Home  I have personally briefly reviewed patient's old medical records in [place]  Chief Complaint: Cough, shortness of breath  HPI: Mike Davila is a 74 y.o. male with medical history significant for CKD 3, hypertension, prediabetes.  Patient presented to the ED with complaints of 1 week of difficulty breathing, and cough.  He also reports some mild diarrhea.  Patient spouse tested positive for COVID-19 infection March 1, she was sick but did not have any difficulty breathing, she did not require hospitalization. Patient has not been tested. He tells me he has not been vaccinated because he has not been able to get it/have access to it. He denies chest pain.  ED Course: O2 sats 87-89% on room air improved with 2 L nasal cannula to 95%.  Temperature 100.  Tachypneic.  WBC 3.7.  Normal lactic acid 3.9.  Elevated creatinine inflammatory markers.  Creatinine 2.65, at baseline.  Possible chest x-ray consistent with COVID-19 infection, showing extensive bilateral heterogeneous airspace opacities consistent with multifocal infection. POC COVID-19 test negative.  Covid PCR test pending.  Also elevated procalcitonin at 0.3.  Hospitalist admit for further evaluation and management.  Review of Systems: As per HPI all other systems reviewed and negative.  Past Medical History:  Diagnosis Date  . Anemia   . Benign localized prostatic hyperplasia with lower urinary tract symptoms (LUTS)   . CKD (chronic kidney disease), stage III [facility])    nephrologist-  dr Hinda Lenis ([facility] in [place])-- per pt last visit 06/ 2019  . Diverticulosis of colon   . ED (erectile dysfunction)   . History of acute gouty arthritis 10/23/2017  . History of acute renal failure 10/23/2017   hospital admission-- w/ acute tubular necrosis superimposed on ckd 3--- resolved  .  History of diverticulitis of colon 10/05/2017   admission-- mild-- resolved w/ medical management  . History of metabolic acidosis 19/50/9326   admission-- resolved  . History of palpitations    approx. 2009, per pt no issue since  . History of urinary retention 10/2017  . Hypertension   . Incomplete right bundle branch block   . Mixed hyperlipidemia   . Nephrolithiasis    left side nonobstructive  . Pre-diabetes   . Right ureteral stone   . Wears dentures    upper    Past Surgical History:  Procedure Laterality Date  . ANKLE SURGERY Bilateral 1990s to early 2000s   removal bone spurs  . APPENDECTOMY  child  . COLONOSCOPY N/A 10/27/2017   Procedure: COLONOSCOPY;  Surgeon: Rogene Houston, MD;  Location[facility];  Service: Endoscopy;  Laterality: N/A;  . CYSTOSCOPY/URETEROSCOPY/HOLMIUM LASER/STENT PLACEMENT Right 05/09/2018   Procedure: CYSTOSCOPY RIGHT URETEROSCOPY/HOLMIUM LASER/STENT PLACEMENT;  Surgeon: Ardis Hughs, MD;  Location: [facility];  Service: Urology;  Laterality: Right;  . HEMATOMA EVACUATION  1990s   right calf  . INGUINAL HERNIA REPAIR Bilateral age 62s  . PLANTAR FASCIA SURGERY Left 2004  . TONSILLECTOMY  child  . TRANSTHORACIC ECHOCARDIOGRAM  10/24/2017   ef 60-65%,  grade 1 diastolic dysfunction     reports that he quit smoking about 13 years ago. His smoking use included cigarettes. He quit after 20.00 years of use. He quit smokeless tobacco use about 2 years ago.  His smokeless tobacco use included chew. He reports that he does  not drink alcohol or use drugs.  Allergies  Allergen Reactions  . Sulfa Antibiotics Hives and Shortness Of Breath  . Crestor [Rosuvastatin Calcium] Other (See Comments)    Leg cramps  . Lipitor [Atorvastatin Calcium] Other (See Comments)    Leg cramps  . Tetanus Toxoids Other (See Comments)    Unknown reaction    Family History  Problem Relation Age of Onset  . Thyroid cancer Mother   . Heart  disease Father        MI  . Colon cancer Father   . Melanoma Father   . Benign prostatic hyperplasia Brother   . Diabetes Mellitus II Maternal Grandmother   . Ovarian cancer Daughter     Prior to Admission medications   Medication Sig Start Date End Date Taking? Authorizing Provider  alfuzosin (UROXATRAL) 10 MG 24 hr tablet Take 10 mg by mouth daily with breakfast.    [provider]  atorvastatin (LIPITOR) 20 MG tablet Take 1 tablet (20 mg total) by mouth daily. 11/07/18   Mike Frizzle, MD  ciprofloxacin (CIPRO) 500 MG tablet Take 1 tablet (500 mg total) by mouth 2 (two) times daily. 02/26/19   Mike Frizzle, MD  furosemide (LASIX) 20 MG tablet Take 20 mg by mouth.    [provider]  ibuprofen (ADVIL,MOTRIN) 200 MG tablet Take 200 mg by mouth every 6 (six) hours as needed.    [provider]  metoprolol succinate (TOPROL-XL) 25 MG 24 hr tablet Take 1 tablet (25 mg total) by mouth daily. Needs office visit and labs before further refills 12/23/19   Mike Frizzle, MD  potassium chloride SA (K-DUR,KLOR-CON) 20 MEQ tablet Take 1 tablet (20 mEq total) by mouth daily for 3 days. 11/07/18 11/10/18  Mike Frizzle, MD    Physical Exam: Vitals:   01/02/20 1630 01/02/20 1700 01/02/20 1730 01/02/20 1746  BP: 136/86 (!) 131/105 124/73   Pulse: (!) 102 91 99 95  Resp: (!) 22 (!) 25 (!) 27 (!) 30  Temp:      TempSrc:      SpO2: 93% 93% 91% 94%    Constitutional: NAD, calm, comfortable Vitals:   01/02/20 1630 01/02/20 1700 01/02/20 1730 01/02/20 1746  BP: 136/86 (!) 131/105 124/73   Pulse: (!) 102 91 99 95  Resp: (!) 22 (!) 25 (!) 27 (!) 30  Temp:      TempSrc:      SpO2: 93% 93% 91% 94%   Eyes: PERRL, lids and conjunctivae normal ENMT: Mucous membranes are moist.  Neck: normal, supple, no masses, no thyromegaly Respiratory:  Normal respiratory effort. No accessory muscle use.  Cardiovascular: Regular rate and rhythm, no murmurs / rubs / gallops. No  extremity edema. 2+ pedal pulses. Abdomen: no tenderness, no masses palpated. No hepatosplenomegaly. Bowel sounds positive.  Musculoskeletal: no clubbing / cyanosis. No joint deformity upper and lower extremities. Good ROM, no contractures.  Skin: no rashes, lesions, ulcers. No induration Neurologic: No apparent cranial nerve abnormality, moving extremities spontaneously. Psychiatric: Normal judgment and insight. Alert and oriented x 3. Normal mood.   Labs on Admission: I have personally reviewed following labs and imaging studies  CBC: Recent Labs  Lab 01/02/20 1543  WBC 3.7*  NEUTROABS 2.9  HGB 12.5*  HCT 40.0  MCV 85.7  PLT 297   Basic Metabolic Panel: Recent Labs  Lab 01/02/20 1543  NA 136  K 3.6  CL 105  CO2 21*  GLUCOSE 130*  BUN  27*  CREATININE 2.65*  CALCIUM 9.0   Liver Function Tests: Recent Labs  Lab 01/02/20 1543  AST 40  ALT 41  ALKPHOS 57  BILITOT 0.6  PROT 7.8  ALBUMIN 3.7   Lipid Profile: Recent Labs    01/02/20 1544  TRIG 215*   Anemia Panel: Recent Labs    01/02/20 1543  FERRITIN 1,074*   Radiological Exams on Admission: DG Chest Port 1 View  Result Date: 01/02/2020 CLINICAL DATA:  Cough, shortness of breath, COVID positive contact EXAM: PORTABLE CHEST 1 VIEW COMPARISON:  None. FINDINGS: The heart size and mediastinal contours are within normal limits. Extensive bilateral heterogeneous airspace opacity. The visualized skeletal structures are unremarkable. IMPRESSION: Extensive bilateral heterogeneous airspace opacity, consistent CLINICAL DATA:  Cough, shortness of breath, COVID positive contact EXAM: PORTABLE CHEST 1 VIEW COMPARISON:  None. FINDINGS: The heart size and mediastinal contours are within normal limits. Extensive bilateral heterogeneous airspace opacity. The visualized skeletal structures are unremarkable. IMPRESSION: Extensive bilateral heterogeneous airspace opacity, consistent with multifocal infection, potentially including  COVID given positive contact. Electronically Signed   By: Eddie Candle M.D.   On: 01/02/2020 16:12   EKG: Independently reviewed.  Sinus rhythm RBBB, LAFB.  QTc 444.  Assessment/Plan Principal Problem:   Pneumonia due to COVID-19 virus Active Problems:   Hypertension   CKD (chronic kidney disease), stage III   Prediabetes  Pneumonia due to COVID-19 virus with acute respiratory failure-dyspnea, cough, mild diarrhea, 1 week.  O2 sats 87 to 89% on room air currently on 2 L nasal cannula sats greater than 95%.  Patient inflammatory markers D-dimer, ferritin, LDH, CRP.  Chest x-ray consistent. POC Covid test negative.  Covid PCR pending. -Dexamethasone 10 mg x 1 continue 6 mg daily -Remdesivir pharmacy to dose -Procalcitonin elevated at 0.3, will start empiric CAP coverage with ceftriaxone and azithromycin -Follow-up blood cultures -Incentive spirometry, flutter valve -Supplemental O2, mucolytic's - Albuterol inhaler PRN. - Multivitamins  Hypertension-stable. -Resume home metoprolol.  CKD 3- creatinine 2.65, close to baseline 2.2- 2.6.   Pre- Diabetes-random glucose 130.  Not on medications. - Hgba1c - SSI - s while on steroids.  BPH - Resume home alfuzosin  DVT prophylaxis: Heparin Code Status: Full code Family Communication: None at bedside Disposition Plan:  > 2 days Consults called: None Admission status: Inpatient, telemetry I certify that at the point of admission it is my clinical judgment that the patient will require inpatient hospital care spanning beyond 2 midnights from the point of admission due to high intensity of service, high risk for further deterioration and high frequency of surveillance required.    Bethena Roys MD [facility]  01/02/2020, 7:35 PM

## 2020-01-03 ENCOUNTER — Encounter (HOSPITAL_COMMUNITY): Payer: Self-pay | Admitting: Internal Medicine

## 2020-01-03 LAB — COMPREHENSIVE METABOLIC PANEL
ALT: 38 U/L (ref 0–44)
AST: 34 U/L (ref 15–41)
Albumin: 3.4 g/dL — ABNORMAL LOW (ref 3.5–5.0)
Alkaline Phosphatase: 55 U/L (ref 38–126)
Anion gap: 12 (ref 5–15)
BUN: 35 mg/dL — ABNORMAL HIGH (ref 8–23)
CO2: 18 mmol/L — ABNORMAL LOW (ref 22–32)
Calcium: 8.9 mg/dL (ref 8.9–10.3)
Chloride: 105 mmol/L (ref 98–111)
Creatinine, Ser: 2.81 mg/dL — ABNORMAL HIGH (ref 0.61–1.24)
GFR calc Af Amer: 25 mL/min — ABNORMAL LOW (ref >60)
GFR calc non Af Amer: 21 mL/min — ABNORMAL LOW (ref >60)
Glucose, Bld: 200 mg/dL — ABNORMAL HIGH (ref 70–99)
Potassium: 4 mmol/L (ref 3.5–5.1)
Sodium: 135 mmol/L (ref 135–145)
Total Bilirubin: 0.7 mg/dL (ref 0.3–1.2)
Total Protein: 7.6 g/dL (ref 6.5–8.1)

## 2020-01-03 LAB — CBC WITH DIFFERENTIAL/PLATELET
Abs Immature Granulocytes: 0.01 10*3/uL (ref 0.00–0.07)
Basophils Absolute: 0 10*3/uL (ref 0.0–0.1)
Basophils Relative: 0 %
Eosinophils Absolute: 0 10*3/uL (ref 0.0–0.5)
Eosinophils Relative: 0 %
HCT: 38.8 % — ABNORMAL LOW (ref 39.0–52.0)
Hemoglobin: 12.4 g/dL — ABNORMAL LOW (ref 13.0–17.0)
Immature Granulocytes: 0 %
Lymphocytes Relative: 13 %
Lymphs Abs: 0.4 10*3/uL — ABNORMAL LOW (ref 0.7–4.0)
MCH: 27.3 pg (ref 26.0–34.0)
MCHC: 32 g/dL (ref 30.0–36.0)
MCV: 85.5 fL (ref 80.0–100.0)
Monocytes Absolute: 0.1 10*3/uL (ref 0.1–1.0)
Monocytes Relative: 5 %
Neutro Abs: 2.3 10*3/uL (ref 1.7–7.7)
Neutrophils Relative %: 82 %
Platelets: 173 10*3/uL (ref 150–400)
RBC: 4.54 MIL/uL (ref 4.22–5.81)
RDW: 15.1 % (ref 11.5–15.5)
WBC: 2.8 10*3/uL — ABNORMAL LOW (ref 4.0–10.5)
nRBC: 0 % (ref 0.0–0.2)

## 2020-01-03 LAB — SARS CORONAVIRUS 2 (TAT 6-24 HRS): SARS Coronavirus 2: POSITIVE — AB

## 2020-01-03 LAB — C-REACTIVE PROTEIN: CRP: 15.5 mg/dL — ABNORMAL HIGH (ref <1.0)

## 2020-01-03 LAB — PHOSPHORUS: Phosphorus: 3.6 mg/dL (ref 2.5–4.6)

## 2020-01-03 LAB — D-DIMER, QUANTITATIVE: D-Dimer, Quant: 1.06 ug/mL-FEU — ABNORMAL HIGH (ref 0.00–0.50)

## 2020-01-03 LAB — GLUCOSE, CAPILLARY
Glucose-Capillary: 169 mg/dL — ABNORMAL HIGH (ref 70–99)
Glucose-Capillary: 167 mg/dL — ABNORMAL HIGH (ref 70–99)
Glucose-Capillary: 181 mg/dL — ABNORMAL HIGH (ref 70–99)
Glucose-Capillary: 195 mg/dL — ABNORMAL HIGH (ref 70–99)

## 2020-01-03 LAB — FERRITIN: Ferritin: 1035 ng/mL — ABNORMAL HIGH (ref 24–336)

## 2020-01-03 LAB — MAGNESIUM: Magnesium: 1.9 mg/dL (ref 1.7–2.4)

## 2020-01-03 MED ORDER — ADULT MULTIVITAMIN W/MINERALS CH
1.0000 | ORAL_TABLET | Freq: Every day | ORAL | Status: DC
  Administered 2020-01-03 – 2020-01-14 (×12): 1 via ORAL
  Filled 2020-01-03 (×12): qty 1

## 2020-01-03 MED ORDER — LOPERAMIDE HCL 2 MG PO CAPS
2.0000 mg | ORAL_CAPSULE | ORAL | Status: AC | PRN
  Administered 2020-01-03: 2 mg via ORAL
  Filled 2020-01-03: qty 1

## 2020-01-03 MED ORDER — ENSURE ENLIVE PO LIQD
237.0000 mL | Freq: Two times a day (BID) | ORAL | Status: DC
  Administered 2020-01-03 – 2020-01-07 (×8): 237 mL via ORAL

## 2020-01-03 MED ORDER — LACTATED RINGERS IV SOLN: INTRAVENOUS | Status: AC

## 2020-01-03 MED ORDER — SACCHAROMYCES BOULARDII 250 MG PO CAPS
250.0000 mg | ORAL_CAPSULE | Freq: Two times a day (BID) | ORAL | Status: DC
  Administered 2020-01-03 – 2020-01-14 (×24): 250 mg via ORAL
  Filled 2020-01-03 (×24): qty 1

## 2020-01-03 NOTE — Plan of Care (Signed)
  Problem: Education: Goal: Knowledge of General Education information will improve Description: Including pain rating scale, medication(s)/side effects and non-pharmacologic comfort measures Outcome: Progressing   Problem: Clinical Measurements: Goal: Will remain free from infection Outcome: Progressing   Problem: Clinical Measurements: Goal: Respiratory complications will improve Outcome: Progressing   Problem: Coping: Goal: Level of anxiety will decrease Outcome: Progressing   Problem: Skin Integrity: Goal: Risk for impaired skin integrity will decrease Outcome: Progressing   Problem: Pain Managment: Goal: General experience of comfort will improve Outcome: Progressing   Problem: Safety: Goal: Ability to remain free from injury will improve Outcome: Progressing   Problem: Respiratory: Goal: Ability to maintain adequate ventilation will improve Outcome: Progressing   Problem: Respiratory: Goal: Ability to maintain a clear airway will improve Outcome: Progressing

## 2020-01-03 NOTE — Progress Notes (Signed)
Initial Nutrition Assessment  DOCUMENTATION CODES:   Obesity unspecified  INTERVENTION:  Ensure Enlive po BID, each supplement provides 350 kcal and 20 grams of protein  Magic cup BID with lunch and dinner meals, each supplement provides 290 kcal and 9 grams of protein  MVI with minerals daily  Recommend liberalizing diet    NUTRITION DIAGNOSIS:   Increased nutrient needs related to catabolic illness(pneumonia due to COVID-19 virus) as evidenced by estimated needs.   GOAL:   Patient will meet greater than or equal to 90% of their needs   MONITOR:   PO intake, Weight trends, Supplement acceptance, Labs, Diet advancement  REASON FOR ASSESSMENT:   Malnutrition Screening Tool    ASSESSMENT:  RD working remotely.  74 year old male with past medical history significant of CKD stage III, HTN, pre-diabetes presented with 1 week history of difficulty breathing, cough, and mild diarrhea. Patient reports that his spouse tested positive for COVID-19 on March 1 and did not require hospitalization. In ED, CXR showed extensive bilateral heterogenous airspace opacities with multifocal infection consistent with pneumonia due to COVID-19 virus infection.  Per notes: -POC Covid test negative, PRC test positive on 3/12 -O2 sats 87-89% on room air improved to >95% on 2 L [place] -SSI while on steroids -n/v/d and loss of appetite; imodium and pro-biotics ordered   Patient is on a heart healthy diet, no documented meals at this time. Current diet order restricts protein, recommend liberalizing to regular diet. Will continue to monitor for po intakes and provide Ensure and Magic Cup supplements to aid with estimated needs.   Current wt 206.36 lbs No recent wt history for review, noted 91.2 kg (200.64 lbs) in January 2020  Medications reviewed and include: Vit C, Decadron, SSI, Zinc sulfate Zithromax Rocephin Remdesivir Labs: CBGs 169, 118 x 24 hrs, BUN 35 (H), Cr 2.81 (H), WBC 2.8 (L) Lab  Results  Component Value Date   HGBA1C 6.1 (H) 01/02/2020     NUTRITION - FOCUSED PHYSICAL EXAM: Unable to complete at this time, RD working remotely.  Diet Order:   Diet Order            Diet Heart Room service appropriate? Yes; Fluid consistency: Thin  Diet effective now              EDUCATION NEEDS:   No education needs have been identified at this time  Skin:  Skin Assessment: Reviewed RN Assessment  Last BM:  3/11 type 6  Height:   Ht Readings from Last 1 Encounters:  01/02/20 5\' 3"  (1.6 m)    Weight:   Wt Readings from Last 1 Encounters:  01/02/20 93.8 kg    Ideal Body Weight:  56.4 kg  BMI:  Body mass index is 36.62 kg/m.  Estimated Nutritional Needs:   Kcal:  2100-2300 (MSJ x 1.3 - 1.4)  Protein:  115-130 (1.2-1.3 g/kg)  Fluid:  >/= 2.1 L/day   Lajuan Lines, RD, LDN Clinical Nutrition After Hours/Weekend Pager # in [place]

## 2020-01-03 NOTE — Progress Notes (Signed)
   01/02/20 2345  Provider Notification  Provider Name/Title Ardith Dark  Date Provider Notified 01/02/20  Time Provider Notified 2345  Notification Type Page  Notification Reason Requested by patient/family (pt. requested imodium)  Response Other (Comment) (waiting)  Date of Provider Response 01/03/20  Time of Provider Response 0010  ordered imodium and pro-biotics

## 2020-01-03 NOTE — Progress Notes (Signed)
Have increased patients oxygen to 6 liters as his saturation has trended down to 85 on 4 liters. He has history of chronic Kidney failure. He is now positive 1,255 cc by chart if correct. D Dimer up 1.06 possible PE forming. Breath sounds decreased. Albumin is down 3.4

## 2020-01-03 NOTE — Care Management Important Message (Signed)
Important Message  Patient Details  Name: Mike Davila MRN: [id number] Date of Birth: 1946/09/19   Medicare Important Message Given:  Yes     [name] 01/03/2020, 2:51 PM

## 2020-01-03 NOTE — Progress Notes (Signed)
PROGRESS NOTE    Mike Davila  YTK:[id number] DOB: 1946-06-19 DOA: 01/02/2020 PCP: [name], MD   Brief Narrative:  Per HPI: Mike Davila is a 74 y.o. male with medical history significant for CKD 3, hypertension, prediabetes.  Patient presented to the ED with complaints of 1 week of difficulty breathing, and cough.  He also reports some mild diarrhea.  Patient spouse tested positive for COVID-19 infection March 1, she was sick but did not have any difficulty breathing, she did not require hospitalization. Patient has not been tested. He tells me he has not been vaccinated because he has not been able to get it/have access to it. He denies chest pain.  3/12: Patient was admitted with acute hypoxemic respiratory failure secondary to Covid pneumonia and has been started on remdesivir and dexamethasone along with Rocephin and azithromycin due to suspected superimposed bacterial infection.  Continue current treatment plan and wean oxygen as tolerated.  Assessment & Plan:   Principal Problem:   Pneumonia due to COVID-19 virus Active Problems:   Hypertension   CKD (chronic kidney disease), stage III   Prediabetes   Acute hypoxemic respiratory failure secondary to COVID-19 pneumonia -Continue to monitor inflammatory markers which are still quite elevated -Continue dexamethasone and remdesivir -Maintain on Rocephin and azithromycin empirically -Continue supportive measures with breathing treatments and mucolytic's  Hypertension-stable -Continue home metoprolol  CKD stage III -Baseline creatinine 2.2-2.6, will continue to monitor  Prediabetes -SSI -A1c 6.1%  BPH -Continue home alfuzosin   DVT prophylaxis: Heparin Code Status: Full Family Communication: Plan to call family Disposition Plan: Continue treatment for COVID-19 pneumonia with expected discharge in approximately 2-3 days.   Consultants:   None  Procedures:   See below  Antimicrobials:   Anti-infectives (From admission, onward)   Start     Dose/Rate Route Frequency Ordered Stop   01/03/20 1000  remdesivir 100 mg in sodium chloride 0.9 % 100 mL IVPB     100 mg 200 mL/hr over 30 Minutes Intravenous Daily 01/02/20 1822 01/07/20 0959   01/02/20 2200  remdesivir 100 mg in sodium chloride 0.9 % 100 mL IVPB     100 mg 200 mL/hr over 30 Minutes Intravenous Every 1 hr x 2 01/02/20 2154 01/03/20 0036   01/02/20 2000  remdesivir 200 mg in sodium chloride 0.9% 250 mL IVPB  Status:  Discontinued     200 mg 580 mL/hr over 30 Minutes Intravenous Once 01/02/20 1820 01/02/20 2320   01/02/20 1800  cefTRIAXone (ROCEPHIN) 1 g in sodium chloride 0.9 % 100 mL IVPB     1 g 200 mL/hr over 30 Minutes Intravenous Every 24 hours 01/02/20 1754     01/02/20 1800  azithromycin (ZITHROMAX) 500 mg in sodium chloride 0.9 % 250 mL IVPB     500 mg 250 mL/hr over 60 Minutes Intravenous Every 24 hours 01/02/20 1754         Subjective: Patient seen and evaluated today with no new acute complaints or concerns. No acute concerns or events noted overnight.  He continues to have a cough as well as some mild shortness of breath.  Objective: Vitals:   01/03/20 0148 01/03/20 0542 01/03/20 1002 01/03/20 1105  BP:  (!) 139/91  (!) 155/89  Pulse:  72  83  Resp:  20    Temp:  97.9 F (36.6 C)  98.3 F (36.8 C)  TempSrc:  Oral  Oral  SpO2: 94% 92% 94% 93%  Weight:  Height:        Intake/Output Summary (Last 24 hours) at 01/03/2020 1157 Last data filed at 01/03/2020 0544 Gross per 24 hour  Intake 898.82 ml  Output --  Net 898.82 ml   Filed Weights   01/02/20 1946  Weight: 93.8 kg    Examination:  General exam: Appears calm and comfortable  Respiratory system: Clear to auscultation. Respiratory effort normal.  Currently on 3 L nasal cannula oxygen. Cardiovascular system: S1 & S2 heard, RRR. No JVD, murmurs, rubs, gallops or clicks. No pedal edema. Gastrointestinal system: Abdomen is  nondistended, soft and nontender. No organomegaly or masses felt. Normal bowel sounds heard. Central nervous system: Alert and oriented. No focal neurological deficits. Extremities: Symmetric 5 x 5 power. Skin: No rashes, lesions or ulcers Psychiatry: Judgement and insight appear normal. Mood & affect appropriate.     Data Reviewed: I have personally reviewed following labs and imaging studies  CBC: Recent Labs  Lab 01/02/20 1543 01/03/20 0558  WBC 3.7* 2.8*  NEUTROABS 2.9 2.3  HGB 12.5* 12.4*  HCT 40.0 38.8*  MCV 85.7 85.5  PLT 173 353   Basic Metabolic Panel: Recent Labs  Lab 01/02/20 1543 01/03/20 0558  NA 136 135  K 3.6 4.0  CL 105 105  CO2 21* 18*  GLUCOSE 130* 200*  BUN 27* 35*  CREATININE 2.65* 2.81*  CALCIUM 9.0 8.9  MG  --  1.9  PHOS  --  3.6   GFR: Estimated Creatinine Clearance: 24.1 mL/min (A) (by C-G formula based on SCr of 2.81 mg/dL (H)). Liver Function Tests: Recent Labs  Lab 01/02/20 1543 01/03/20 0558  AST 40 34  ALT 41 38  ALKPHOS 57 55  BILITOT 0.6 0.7  PROT 7.8 7.6  ALBUMIN 3.7 3.4*   No results for input(s): LIPASE, AMYLASE in the last 168 hours. No results for input(s): AMMONIA in the last 168 hours. Coagulation Profile: No results for input(s): INR, PROTIME in the last 168 hours. Cardiac Enzymes: No results for input(s): CKTOTAL, CKMB, CKMBINDEX, TROPONINI in the last 168 hours. BNP (last 3 results) No results for input(s): PROBNP in the last 8760 hours. HbA1C: Recent Labs    01/02/20 1543  HGBA1C 6.1*   CBG: Recent Labs  Lab 01/02/20 2122 01/03/20 0828 01/03/20 1155  GLUCAP 118* 169* 167*   Lipid Profile: Recent Labs    01/02/20 1544  TRIG 215*   Thyroid Function Tests: No results for input(s): TSH, T4TOTAL, FREET4, T3FREE, THYROIDAB in the last 72 hours. Anemia Panel: Recent Labs    01/02/20 1543 01/03/20 0558  FERRITIN 1,074* 1,035*   Sepsis Labs: Recent Labs  Lab 01/02/20 1543  PROCALCITON 0.35   LATICACIDVEN 0.9    Recent Results (from the past 240 hour(s))  Blood Culture (routine x 2)     Status: None (Preliminary result)   Collection Time: 01/02/20  3:43 PM   Specimen: Right Antecubital; Blood  Result Value Ref Range Status   Specimen Description RIGHT ANTECUBITAL  Final   Special Requests   Final    BOTTLES DRAWN AEROBIC AND ANAEROBIC Blood Culture adequate volume   Culture   Final    NO GROWTH < 24 HOURS Performed at [facility], [address] [place], [place] [address]    Report Status PENDING  Incomplete  Blood Culture (routine x 2)     Status: None (Preliminary result)   Collection Time: 01/02/20  3:54 PM   Specimen: Left Antecubital; Blood  Result Value Ref Range  Status   Specimen Description LEFT ANTECUBITAL  Final   Special Requests   Final    BOTTLES DRAWN AEROBIC AND ANAEROBIC Blood Culture adequate volume   Culture   Final    NO GROWTH < 24 HOURS Performed at [facility], [address] [place], [place] [address]    Report Status PENDING  Incomplete  SARS CORONAVIRUS 2 (TAT 6-24 HRS) Nasopharyngeal Nasopharyngeal Swab     Status: Abnormal   Collection Time: 01/02/20  5:23 PM   Specimen: Nasopharyngeal Swab  Result Value Ref Range Status   SARS Coronavirus 2 POSITIVE (A) NEGATIVE Final    Comment: RESULT CALLED TO, READ BACK BY AND VERIFIED WITH: R. DAWAY,RN 0210 01/03/2020 T. TYSOR (NOTE) SARS-CoV-2 target nucleic acids are DETECTED. The SARS-CoV-2 RNA is generally detectable in upper and lower respiratory specimens during the acute phase of infection. Positive results are indicative of the presence of SARS-CoV-2 RNA. Clinical correlation with patient history and other diagnostic information is  necessary to determine patient infection status. Positive results do not rule out bacterial infection or co-infection with other viruses.  The expected result is Negative. Fact Sheet for Patients: SugarRoll.be Fact Sheet for  Healthcare Providers: https://www.woods-mathews.com/ This test is not yet approved or cleared by the [place] FDA and  has been authorized for detection and/or diagnosis of SARS-CoV-2 by FDA under an Emergency Use Authorization (EUA). This EUA will remain  in effect (meaning this test can be used) for th e duration of the COVID-19 declaration under Section 564(b)(1) of the Act, 21 U.S.C. section 360bbb-3(b)(1), unless the authorization is terminated or revoked sooner. Performed at [facility], [place] [address], [place], [place] [address]          Radiology Studies: DG Chest Port 1 View  Result Date: 01/02/2020 CLINICAL DATA:  Cough, shortness of breath, COVID positive contact EXAM: PORTABLE CHEST 1 VIEW COMPARISON:  None. FINDINGS: The heart size and mediastinal contours are within normal limits. Extensive bilateral heterogeneous airspace opacity. The visualized skeletal structures are unremarkable. IMPRESSION: Extensive bilateral heterogeneous airspace opacity, consistent CLINICAL DATA:  Cough, shortness of breath, COVID positive contact EXAM: PORTABLE CHEST 1 VIEW COMPARISON:  None. FINDINGS: The heart size and mediastinal contours are within normal limits. Extensive bilateral heterogeneous airspace opacity. The visualized skeletal structures are unremarkable. IMPRESSION: Extensive bilateral heterogeneous airspace opacity, consistent with multifocal infection, potentially including COVID given positive contact. Electronically Signed   By: Eddie Candle M.D.   On: 01/02/2020 16:12        Scheduled Meds: . albuterol  2 puff Inhalation Q6H  . alfuzosin  10 mg Oral Q breakfast  . vitamin C  500 mg Oral Daily  . dexamethasone (DECADRON) injection  6 mg Intravenous Q24H  . feeding supplement (ENSURE ENLIVE)  237 mL Oral BID BM  . heparin  7,500 Units Subcutaneous Q8H  . insulin aspart  0-5 Units Subcutaneous QHS  . insulin aspart  0-9 Units Subcutaneous TID WC  .  metoprolol succinate  25 mg Oral Daily  . multivitamin with minerals  1 tablet Oral Daily  . saccharomyces boulardii  250 mg Oral BID  . zinc sulfate  220 mg Oral Daily   Continuous Infusions: . azithromycin Stopped (01/02/20 2321)  . cefTRIAXone (ROCEPHIN)  IV Stopped (01/02/20 2120)  . remdesivir 100 mg in NS 100 mL 100 mg (01/03/20 1109)     LOS: 1 day    Time spent: 35 minutes    Josseline Reddin D Manuella Ghazi,  DO [facility]  If 7PM-7AM, please contact night-coverage [email or web address] 01/03/2020, 11:57 AM

## 2020-01-03 NOTE — Progress Notes (Addendum)
   01/03/20 0247  Provider Notification  Provider Name/Title Dr. Humphrey Rolls  Date Provider Notified 01/03/20  Time Provider Notified [phone]  Notification Type Page  Notification Reason Other (Comment) (Covid test positive)  Response Other (Comment) (waiting)    Reported by Annamarie Dawley.

## 2020-01-04 ENCOUNTER — Inpatient Hospital Stay (HOSPITAL_COMMUNITY): Payer: Medicare Other

## 2020-01-04 LAB — URINALYSIS, ROUTINE W REFLEX MICROSCOPIC
Bacteria, UA: NONE SEEN
Bilirubin Urine: NEGATIVE
Glucose, UA: NEGATIVE mg/dL
Ketones, ur: NEGATIVE mg/dL
Leukocytes,Ua: NEGATIVE
Nitrite: NEGATIVE
Protein, ur: NEGATIVE mg/dL
Specific Gravity, Urine: 1.008 (ref 1.005–1.030)
pH: 5 (ref 5.0–8.0)

## 2020-01-04 LAB — CBC WITH DIFFERENTIAL/PLATELET
Abs Immature Granulocytes: 0.03 10*3/uL (ref 0.00–0.07)
Basophils Absolute: 0 10*3/uL (ref 0.0–0.1)
Basophils Relative: 0 %
Eosinophils Absolute: 0 10*3/uL (ref 0.0–0.5)
Eosinophils Relative: 0 %
HCT: 39.1 % (ref 39.0–52.0)
Hemoglobin: 12.6 g/dL — ABNORMAL LOW (ref 13.0–17.0)
Immature Granulocytes: 1 %
Lymphocytes Relative: 8 %
Lymphs Abs: 0.5 10*3/uL — ABNORMAL LOW (ref 0.7–4.0)
MCH: 27.3 pg (ref 26.0–34.0)
MCHC: 32.2 g/dL (ref 30.0–36.0)
MCV: 84.8 fL (ref 80.0–100.0)
Monocytes Absolute: 0.5 10*3/uL (ref 0.1–1.0)
Monocytes Relative: 8 %
Neutro Abs: 5.5 10*3/uL (ref 1.7–7.7)
Neutrophils Relative %: 83 %
Platelets: 222 10*3/uL (ref 150–400)
RBC: 4.61 MIL/uL (ref 4.22–5.81)
RDW: 15.3 % (ref 11.5–15.5)
WBC: 6.6 10*3/uL (ref 4.0–10.5)
nRBC: 0 % (ref 0.0–0.2)

## 2020-01-04 LAB — GLUCOSE, CAPILLARY
Glucose-Capillary: 136 mg/dL — ABNORMAL HIGH (ref 70–99)
Glucose-Capillary: 145 mg/dL — ABNORMAL HIGH (ref 70–99)
Glucose-Capillary: 189 mg/dL — ABNORMAL HIGH (ref 70–99)
Glucose-Capillary: 171 mg/dL — ABNORMAL HIGH (ref 70–99)

## 2020-01-04 LAB — MRSA PCR SCREENING: MRSA by PCR: NEGATIVE

## 2020-01-04 LAB — COMPREHENSIVE METABOLIC PANEL
ALT: 37 U/L (ref 0–44)
AST: 34 U/L (ref 15–41)
Albumin: 3.6 g/dL (ref 3.5–5.0)
Alkaline Phosphatase: 55 U/L (ref 38–126)
Anion gap: 13 (ref 5–15)
BUN: 58 mg/dL — ABNORMAL HIGH (ref 8–23)
CO2: 18 mmol/L — ABNORMAL LOW (ref 22–32)
Calcium: 9.3 mg/dL (ref 8.9–10.3)
Chloride: 105 mmol/L (ref 98–111)
Creatinine, Ser: 2.99 mg/dL — ABNORMAL HIGH (ref 0.61–1.24)
GFR calc Af Amer: 23 mL/min — ABNORMAL LOW (ref >60)
GFR calc non Af Amer: 20 mL/min — ABNORMAL LOW (ref >60)
Glucose, Bld: 148 mg/dL — ABNORMAL HIGH (ref 70–99)
Potassium: 3.9 mmol/L (ref 3.5–5.1)
Sodium: 136 mmol/L (ref 135–145)
Total Bilirubin: 0.6 mg/dL (ref 0.3–1.2)
Total Protein: 7.4 g/dL (ref 6.5–8.1)

## 2020-01-04 LAB — D-DIMER, QUANTITATIVE: D-Dimer, Quant: 0.99 ug/mL-FEU — ABNORMAL HIGH (ref 0.00–0.50)

## 2020-01-04 LAB — BLOOD GAS, ARTERIAL
Acid-base deficit: 7.7 mmol/L — ABNORMAL HIGH (ref 0.0–2.0)
Bicarbonate: 18.4 mmol/L — ABNORMAL LOW (ref 20.0–28.0)
FIO2: 44
O2 Saturation: 82.7 %
Patient temperature: 36.8
pCO2 arterial: 29.8 mmHg — ABNORMAL LOW (ref 32.0–48.0)
pH, Arterial: 7.365 (ref 7.350–7.450)
pO2, Arterial: 50.9 mmHg — ABNORMAL LOW (ref 83.0–108.0)

## 2020-01-04 LAB — C-REACTIVE PROTEIN: CRP: 9.2 mg/dL — ABNORMAL HIGH (ref <1.0)

## 2020-01-04 LAB — PROCALCITONIN: Procalcitonin: 0.38 ng/mL

## 2020-01-04 LAB — FERRITIN: Ferritin: 1496 ng/mL — ABNORMAL HIGH (ref 24–336)

## 2020-01-04 LAB — SODIUM, URINE, RANDOM: Sodium, Ur: 90 mmol/L

## 2020-01-04 LAB — BRAIN NATRIURETIC PEPTIDE: B Natriuretic Peptide: 93 pg/mL (ref 0.0–100.0)

## 2020-01-04 LAB — PHOSPHORUS: Phosphorus: 3.5 mg/dL (ref 2.5–4.6)

## 2020-01-04 LAB — CREATININE, URINE, RANDOM: Creatinine, Urine: 37.89 mg/dL

## 2020-01-04 LAB — MAGNESIUM: Magnesium: 2 mg/dL (ref 1.7–2.4)

## 2020-01-04 MED ORDER — FUROSEMIDE 10 MG/ML IJ SOLN: 40.0000 mg | Freq: Once | INTRAMUSCULAR | Status: DC

## 2020-01-04 MED ORDER — CHLORHEXIDINE GLUCONATE CLOTH 2 % EX PADS
6.0000 | MEDICATED_PAD | Freq: Every day | CUTANEOUS | Status: DC
  Administered 2020-01-04 – 2020-01-11 (×8): 6 via TOPICAL

## 2020-01-04 MED ORDER — SALINE SPRAY 0.65 % NA SOLN
1.0000 | NASAL | Status: DC | PRN
  Administered 2020-01-04 – 2020-01-08 (×6): 1 via NASAL
  Filled 2020-01-04 (×2): qty 44

## 2020-01-04 MED ORDER — LACTATED RINGERS IV SOLN: INTRAVENOUS | Status: DC

## 2020-01-04 MED ORDER — LORAZEPAM 2 MG/ML IJ SOLN
0.5000 mg | Freq: Once | INTRAMUSCULAR | Status: AC
  Administered 2020-01-04: 0.5 mg via INTRAVENOUS
  Filled 2020-01-04: qty 1

## 2020-01-04 MED ORDER — ALFUZOSIN HCL ER 10 MG PO TB24
10.0000 mg | ORAL_TABLET | Freq: Every day | ORAL | Status: DC
  Administered 2020-01-05 – 2020-01-14 (×10): 10 mg via ORAL
  Filled 2020-01-04 (×12): qty 1

## 2020-01-04 MED ORDER — FUROSEMIDE 10 MG/ML IJ SOLN
80.0000 mg | Freq: Once | INTRAMUSCULAR | Status: AC
  Administered 2020-01-04: 13:00:00 80 mg via INTRAVENOUS
  Filled 2020-01-04: qty 8

## 2020-01-04 MED ORDER — ORAL CARE MOUTH RINSE
15.0000 mL | Freq: Two times a day (BID) | OROMUCOSAL | Status: DC
  Administered 2020-01-04 – 2020-01-13 (×19): 15 mL via OROMUCOSAL

## 2020-01-04 MED ORDER — TOCILIZUMAB 400 MG/20ML IV SOLN: 8.0000 mg/kg | Freq: Once | INTRAVENOUS | Status: DC

## 2020-01-04 MED ORDER — ALPRAZOLAM 0.5 MG PO TABS
0.5000 mg | ORAL_TABLET | Freq: Three times a day (TID) | ORAL | Status: DC | PRN
  Administered 2020-01-04 – 2020-01-12 (×7): 0.5 mg via ORAL
  Filled 2020-01-04 (×8): qty 1

## 2020-01-04 MED ORDER — METOPROLOL SUCCINATE ER 25 MG PO TB24
12.5000 mg | ORAL_TABLET | Freq: Every day | ORAL | Status: DC
  Administered 2020-01-05 – 2020-01-14 (×10): 12.5 mg via ORAL
  Filled 2020-01-04 (×10): qty 1

## 2020-01-04 NOTE — Progress Notes (Signed)
This RN notified dr. Humphrey Rolls for patient's current status. Pt. On a highflow [place] at 8lpm. Patient denies difficulty of breathing but he voiced he is very anxious about his KIDNEY failing.  I also noted coarse tremors in his BLT upper extremities. He stated he normally have the tremors when he's having problem with his kidney.  Dr. Humphrey Rolls ordered one time dose of Ativan 0.5 mg IV to help with the anxiety.

## 2020-01-04 NOTE — Progress Notes (Signed)
Patient unable to maintain O2 saturation. Saturation 80's at 7 L HFNC. MD has been notified. Received orders to Transfer patient to ICU stepdown. Patient and family have been notified of transfer. Report called and given to [facility], ICU. All questions answered and no further questions at this time.

## 2020-01-04 NOTE — Progress Notes (Signed)
PROGRESS NOTE    Mike Davila  KXF:[id number] DOB: 02/10/1946 DOA: 01/02/2020 PCP: [name], MD   Brief Narrative:  Per HPI: Mike Davila a 74 y.o.malewith medical history significant forCKD 4, hypertension,prediabetes. Patient presented to the ED with complaints of 1 week of difficulty breathing, and cough. He also reports some mild diarrhea. Patient spouse tested positive for COVID-19 infection March 1,she was sick but did not have any difficulty breathing,she did not require hospitalization.Patient has not been tested. He tells me he has not been vaccinated because he has not been able to get it/have access to it. He denies chest pain.  3/12: Patient was admitted with acute hypoxemic respiratory failure secondary to Covid pneumonia and has been started on remdesivir and dexamethasone along with Rocephin and azithromycin due to suspected superimposed bacterial infection.  Continue current treatment plan and wean oxygen as tolerated.  3/13: Patient noted to have worsening hypoxemia and is now requiring up to 10 L high flow nasal cannula.  He will be transferred to stepdown unit for further monitoring.  Inflammatory markers are downward trending and chest x-ray appears stable.  It appears that he may be having noncardiogenic pulmonary edema for which he will be given some IV Lasix and IV fluid will be further held.  Continue to monitor renal panel closely.  Appreciate nephrology involvement to assist with volume management in the setting of stage IV CKD.  Assessment & Plan:   Principal Problem:   Pneumonia due to COVID-19 virus Active Problems:   Hypertension   CKD (chronic kidney disease), stage III   Prediabetes   Acute hypoxemic respiratory failure secondary to COVID-19 pneumonia-worsening with component of likely noncardiogenic pulmonary edema -Continue to monitor inflammatory markers which are downtrending -Continue dexamethasone and  remdesivir -Transfer to stepdown unit for careful monitoring -IV Lasix 80 mg x 1 and avoid further IV fluid and monitor response -Maintain on Rocephin and azithromycin empirically with ongoing procalcitonin elevation -Continue supportive measures with breathing treatments and mucolytic's -Appears to have component of anxiety for which Xanax has been ordered  Hypertension-stable -Continue home metoprolol and monitor carefully with aggressive diuresis today  CKD stage4 -Baseline creatinine 2.2-2.6 -Currently up to 2.99 and family requesting nephrology evaluation -IV Lasix diuresis as noted above -Continue to monitor strict I's and O's as well as repeat labs in a.m.  Prediabetes-controlled -Monitor carefully while on steroids -SSI -A1c 6.1%  BPH -Continue home alfuzosin  Anxiety -Xanax as needed   DVT prophylaxis: Heparin Code Status: Full Family Communication:  Discussed with daughter Sharyn Lull Disposition Plan: Continue treatment for COVID-19 pneumonia and monitor closely in stepdown unit.  IV Lasix given for diuresis which will hopefully help with hypoxemia.  Monitor closely and wean oxygen as tolerated.  Appreciate nephrology involvement.   Consultants:   Nephrology  Procedures:   See below  Antimicrobials:  Anti-infectives (From admission, onward)   Start     Dose/Rate Route Frequency Ordered Stop   01/03/20 1000  remdesivir 100 mg in sodium chloride 0.9 % 100 mL IVPB     100 mg 200 mL/hr over 30 Minutes Intravenous Daily 01/02/20 1822 01/07/20 0959   01/02/20 2200  remdesivir 100 mg in sodium chloride 0.9 % 100 mL IVPB     100 mg 200 mL/hr over 30 Minutes Intravenous Every 1 hr x 2 01/02/20 2154 01/03/20 0036   01/02/20 2000  remdesivir 200 mg in sodium chloride 0.9% 250 mL IVPB  Status:  Discontinued     200  mg 580 mL/hr over 30 Minutes Intravenous Once 01/02/20 1820 01/02/20 2320   01/02/20 1800  cefTRIAXone (ROCEPHIN) 1 g in sodium chloride 0.9 %  100 mL IVPB     1 g 200 mL/hr over 30 Minutes Intravenous Every 24 hours 01/02/20 1754     01/02/20 1800  azithromycin (ZITHROMAX) 500 mg in sodium chloride 0.9 % 250 mL IVPB     500 mg 250 mL/hr over 60 Minutes Intravenous Every 24 hours 01/02/20 1754         Subjective: Patient seen and evaluated today with increasing oxygen requirements up to 7 L and then subsequently 10 L.  He is becoming more anxious as well and is worried about his kidney function.  Objective: Vitals:   01/04/20 0630 01/04/20 0801 01/04/20 0908 01/04/20 1257  BP:   110/75   Pulse:      Resp:      Temp:      TempSrc:      SpO2: 90% 90%  90%  Weight:      Height:        Intake/Output Summary (Last 24 hours) at 01/04/2020 1330 Last data filed at 01/04/2020 7062 Gross per 24 hour  Intake 1005.76 ml  Output 400 ml  Net 605.76 ml   Filed Weights   01/02/20 1946  Weight: 93.8 kg    Examination:  General exam: Appears calm and comfortable, has tremors Respiratory system: Diminished to auscultation.  Currently on 10 L high flow nasal cannula oxygen. Cardiovascular system: S1 & S2 heard, RRR. No JVD, murmurs, rubs, gallops or clicks. No pedal edema. Gastrointestinal system: Abdomen is nondistended, soft and nontender. No organomegaly or masses felt. Normal bowel sounds heard. Central nervous system: Alert and oriented. No focal neurological deficits. Extremities: Symmetric 5 x 5 power. Skin: No rashes, lesions or ulcers Psychiatry: Appears anxious    Data Reviewed: I have personally reviewed following labs and imaging studies  CBC: Recent Labs  Lab 01/02/20 1543 01/03/20 0558 01/04/20 0740  WBC 3.7* 2.8* 6.6  NEUTROABS 2.9 2.3 5.5  HGB 12.5* 12.4* 12.6*  HCT 40.0 38.8* 39.1  MCV 85.7 85.5 84.8  PLT 173 173 376   Basic Metabolic Panel: Recent Labs  Lab 01/02/20 1543 01/03/20 0558 01/04/20 0740  NA 136 135 136  K 3.6 4.0 3.9  CL 105 105 105  CO2 21* 18* 18*  GLUCOSE 130* 200* 148*   BUN 27* 35* 58*  CREATININE 2.65* 2.81* 2.99*  CALCIUM 9.0 8.9 9.3  MG  --  1.9 2.0  PHOS  --  3.6 3.5   GFR: Estimated Creatinine Clearance: 22.6 mL/min (A) (by C-G formula based on SCr of 2.99 mg/dL (H)). Liver Function Tests: Recent Labs  Lab 01/02/20 1543 01/03/20 0558 01/04/20 0740  AST 40 34 34  ALT 41 38 37  ALKPHOS 57 55 55  BILITOT 0.6 0.7 0.6  PROT 7.8 7.6 7.4  ALBUMIN 3.7 3.4* 3.6   No results for input(s): LIPASE, AMYLASE in the last 168 hours. No results for input(s): AMMONIA in the last 168 hours. Coagulation Profile: No results for input(s): INR, PROTIME in the last 168 hours. Cardiac Enzymes: No results for input(s): CKTOTAL, CKMB, CKMBINDEX, TROPONINI in the last 168 hours. BNP (last 3 results) No results for input(s): PROBNP in the last 8760 hours. HbA1C: Recent Labs    01/02/20 1543  HGBA1C 6.1*   CBG: Recent Labs  Lab 01/03/20 1155 01/03/20 1705 01/03/20 2104 01/04/20 2831 01/04/20 1208  GLUCAP 167* 181* 195* 136* 145*   Lipid Profile: Recent Labs    01/02/20 1544  TRIG 215*   Thyroid Function Tests: No results for input(s): TSH, T4TOTAL, FREET4, T3FREE, THYROIDAB in the last 72 hours. Anemia Panel: Recent Labs    01/03/20 0558 01/04/20 0740  FERRITIN 1,035* 1,496*   Sepsis Labs: Recent Labs  Lab 01/02/20 1543 01/04/20 0740  PROCALCITON 0.35 0.38  LATICACIDVEN 0.9  --     Recent Results (from the past 240 hour(s))  Blood Culture (routine x 2)     Status: None (Preliminary result)   Collection Time: 01/02/20  3:43 PM   Specimen: Right Antecubital; Blood  Result Value Ref Range Status   Specimen Description RIGHT ANTECUBITAL  Final   Special Requests   Final    BOTTLES DRAWN AEROBIC AND ANAEROBIC Blood Culture adequate volume   Culture   Final    NO GROWTH 2 DAYS Performed at [facility], [address] [place], [place] [address]    Report Status PENDING  Incomplete  Blood Culture (routine x 2)     Status: None  (Preliminary result)   Collection Time: 01/02/20  3:54 PM   Specimen: Left Antecubital; Blood  Result Value Ref Range Status   Specimen Description LEFT ANTECUBITAL  Final   Special Requests   Final    BOTTLES DRAWN AEROBIC AND ANAEROBIC Blood Culture adequate volume   Culture   Final    NO GROWTH 2 DAYS Performed at [facility], [address], [place], [place] [address]    Report Status PENDING  Incomplete  SARS CORONAVIRUS 2 (TAT 6-24 HRS) Nasopharyngeal Nasopharyngeal Swab     Status: Abnormal   Collection Time: 01/02/20  5:23 PM   Specimen: Nasopharyngeal Swab  Result Value Ref Range Status   SARS Coronavirus 2 POSITIVE (A) NEGATIVE Final    Comment: RESULT CALLED TO, READ BACK BY AND VERIFIED WITH: R. DAWAY,RN 0210 01/03/2020 T. TYSOR (NOTE) SARS-CoV-2 target nucleic acids are DETECTED. The SARS-CoV-2 RNA is generally detectable in upper and lower respiratory specimens during the acute phase of infection. Positive results are indicative of the presence of SARS-CoV-2 RNA. Clinical correlation with patient history and other diagnostic information is  necessary to determine patient infection status. Positive results do not rule out bacterial infection or co-infection with other viruses.  The expected result is Negative. Fact Sheet for Patients: SugarRoll.be Fact Sheet for Healthcare Providers: https://www.woods-mathews.com/ This test is not yet approved or cleared by the [place] FDA and  has been authorized for detection and/or diagnosis of SARS-CoV-2 by FDA under an Emergency Use Authorization (EUA). This EUA will remain  in effect (meaning this test can be used) for th e duration of the COVID-19 declaration under Section 564(b)(1) of the Act, 21 U.S.C. section 360bbb-3(b)(1), unless the authorization is terminated or revoked sooner. Performed at [facility], [place] [address], [place], [place] [address]           Radiology Studies: DG Chest 1 View  Result Date: 01/04/2020 CLINICAL DATA:  Hypoxemia, COVID positive EXAM: CHEST  1 VIEW COMPARISON:  01/02/2020 FINDINGS: Patchy left upper lobe and bilateral lower lobe opacities, unchanged. No pleural effusion or pneumothorax. The heart is normal in size. IMPRESSION: Stable multifocal pneumonia in this patient with known COVID. Electronically Signed   By: Julian Hy M.D.   On: 01/04/2020 09:10   DG Chest Port 1 View  Result Date: 01/02/2020 CLINICAL DATA:  Cough, shortness of breath,  COVID positive contact EXAM: PORTABLE CHEST 1 VIEW COMPARISON:  None. FINDINGS: The heart size and mediastinal contours are within normal limits. Extensive bilateral heterogeneous airspace opacity. The visualized skeletal structures are unremarkable. IMPRESSION: Extensive bilateral heterogeneous airspace opacity, consistent CLINICAL DATA:  Cough, shortness of breath, COVID positive contact EXAM: PORTABLE CHEST 1 VIEW COMPARISON:  None. FINDINGS: The heart size and mediastinal contours are within normal limits. Extensive bilateral heterogeneous airspace opacity. The visualized skeletal structures are unremarkable. IMPRESSION: Extensive bilateral heterogeneous airspace opacity, consistent with multifocal infection, potentially including COVID given positive contact. Electronically Signed   By: Eddie Candle M.D.   On: 01/02/2020 16:12        Scheduled Meds: . albuterol  2 puff Inhalation Q6H  . alfuzosin  10 mg Oral Q breakfast  . vitamin C  500 mg Oral Daily  . Chlorhexidine Gluconate Cloth  6 each Topical Daily  . dexamethasone (DECADRON) injection  6 mg Intravenous Q24H  . feeding supplement (ENSURE ENLIVE)  237 mL Oral BID BM  . heparin  7,500 Units Subcutaneous Q8H  . insulin aspart  0-5 Units Subcutaneous QHS  . insulin aspart  0-9 Units Subcutaneous TID WC  . metoprolol succinate  25 mg Oral Daily  . multivitamin with minerals  1 tablet Oral Daily  .  saccharomyces boulardii  250 mg Oral BID  . zinc sulfate  220 mg Oral Daily   Continuous Infusions: . azithromycin Stopped (01/03/20 2006)  . cefTRIAXone (ROCEPHIN)  IV 1 g (01/03/20 1717)  . remdesivir 100 mg in NS 100 mL 100 mg (01/04/20 0925)     LOS: 2 days    Time spent: 29 minutes    Chania Kochanski D Manuella Ghazi, DO [facility]  If 7PM-7AM, please contact night-coverage [email or web address] 01/04/2020, 1:30 PM

## 2020-01-04 NOTE — Consult Note (Signed)
Renal Service Consult Note [facility]  Mike Davila 01/04/2020 Mike Davila Requesting Physician:  Dr Mike Davila  Reason for Consult:  CKD patient w/ COVID+ PNA HPI: The patient is a 74 y.o. year-old with hx of HTN, kidney stones, urinary retention, gout, CKD 4, BPH and anemia presented w/ SOB and cough x 1 week, mult family members w/ COVID.  In ED COVID returned +, SpO2 was 87% on RA , temp 100 , ^RR, creat 2.65 (baseline), CXR bilat opacities c/w multifocal infection. Fevers have improved from 103 to 99 deg F since admit 2 days ago. Creat increased from 2.6 to 2.8 yest and 2.99 today.  SpO2 worsening from 2L [place] on admit to 7 L HFNC today. Repeat CXR today showed stable multifocal PNA.  I/O since admit is 2.6 L in and 1.0 L out. BP's are normal. Pt rec'd 1 dose IV lasix 49m today at noon. BP's today are on the lower side. Getting metoprolol 25 xl qd and     Pt seen in room.  Has chronic tremors , runs in the family, comes and goes along w/ anxiety usually.  +cough, no sig SOB, no leg swelling or CP or abd swelling.      No contrast , nsaids or acei/ arb.  NO hypotensive episodes here.     Date  Creat  eGFR   2014- 17 1.4- 1.7 41- 55   2018- 19 4.7 >>>2.84 AKI episode jan 2019   Jan 2020 2.26  28   01/02/20 2.65  23   01/03/20 2.81  21   01/04/20 2.99  20     ROS  denies CP  no joint pain   no HA  no blurry vision  no rash  no diarrhea  no nausea/ vomiting  no dysuria  no difficulty voiding  no change in urine color    Past Medical History  Past Medical History:  Diagnosis Date  . Anemia   . Benign localized prostatic hyperplasia with lower urinary tract symptoms (LUTS)   . CKD (chronic kidney disease), stage III    nephrologist-  dr Mike Davila([facility] [place]-- per pt last visit 06/ 2019  . Diverticulosis of colon   . ED (erectile dysfunction)   . History of acute gouty arthritis 10/23/2017  . History of acute renal failure 10/23/2017   hospital  admission-- w/ acute tubular necrosis superimposed on ckd 3--- resolved  . History of diverticulitis of colon 10/05/2017   admission-- mild-- resolved w/ medical management  . History of metabolic acidosis 165/53/7482  admission-- resolved  . History of palpitations    approx. 2009, per pt no issue since  . History of urinary retention 10/2017  . Hypertension   . Incomplete right bundle branch block   . Mixed hyperlipidemia   . Nephrolithiasis    left side nonobstructive  . Pre-diabetes   . Right ureteral stone   . Wears dentures    upper   Past Surgical History  Past Surgical History:  Procedure Laterality Date  . ANKLE SURGERY Bilateral 1990s to early 2000s   removal bone spurs  . APPENDECTOMY  child  . COLONOSCOPY N/A 10/27/2017   Procedure: COLONOSCOPY;  Surgeon: RRogene Houston MD;  Location: [facility];  Service: Endoscopy;  Laterality: N/A;  . CYSTOSCOPY/URETEROSCOPY/HOLMIUM LASER/STENT PLACEMENT Right 05/09/2018   Procedure: CYSTOSCOPY RIGHT URETEROSCOPY/HOLMIUM LASER/STENT PLACEMENT;  Surgeon: HArdis Hughs MD;  [facility]  Service: Urology;  Laterality:  Right;  Marland Kitchen HEMATOMA EVACUATION  1990s   right calf  . INGUINAL HERNIA REPAIR Bilateral age 57s  . PLANTAR FASCIA SURGERY Left 2004  . TONSILLECTOMY  child  . TRANSTHORACIC ECHOCARDIOGRAM  10/24/2017   ef 60-65%,  grade 1 diastolic dysfunction   Family History  Family History  Problem Relation Age of Onset  . Thyroid cancer Mother   . Heart disease Father        MI  . Colon cancer Father   . Melanoma Father   . Benign prostatic hyperplasia Brother   . Diabetes Mellitus II Maternal Grandmother   . Ovarian cancer Daughter    Social History  reports that he quit smoking about 13 years ago. His smoking use included cigarettes. He quit after 20.00 years of use. He quit smokeless tobacco use about 2 years ago.  His smokeless tobacco use included chew. He reports that he does not  drink alcohol or use drugs. Allergies  Allergies  Allergen Reactions  . Sulfa Antibiotics Hives and Shortness Of Breath  . Crestor [Rosuvastatin Calcium] Other (See Comments)    Leg cramps  . Lipitor [Atorvastatin Calcium] Other (See Comments)    Leg cramps  . Tetanus Toxoids Other (See Comments)    Unknown reaction   Home medications Prior to Admission medications   Medication Sig Start Date End Date Taking? Authorizing Provider  alfuzosin (UROXATRAL) 10 MG 24 hr tablet Take 10 mg by mouth daily with breakfast.   Yes [provider]  ibuprofen (ADVIL,MOTRIN) 200 MG tablet Take 200 mg by mouth every 6 (six) hours as needed.   Yes [provider]  metoprolol succinate (TOPROL-XL) 25 MG 24 hr tablet Take 1 tablet (25 mg total) by mouth daily. Needs office visit and labs before further refills Patient taking differently: Take 25 mg by mouth daily.  12/23/19  Yes Mike Frizzle, MD  atorvastatin (LIPITOR) 20 MG tablet Take 1 tablet (20 mg total) by mouth daily. 11/07/18   Mike Frizzle, MD  furosemide (LASIX) 20 MG tablet Take 20 mg by mouth 2 (two) times daily.     [provider]  potassium chloride SA (K-DUR,KLOR-CON) 20 MEQ tablet Take 1 tablet (20 mEq total) by mouth daily for 3 days. Patient not taking: Reported on 01/03/2020 11/07/18 11/10/18  Mike Luo T, MD     Vitals:   01/04/20 1630 01/04/20 1700 01/04/20 1730 01/04/20 1800  BP: 126/75 117/82 125/86 109/73  Pulse: 81 80 97 78  Resp: 16 20 (!) 25 19  Temp:      TempSrc:      SpO2: 96% 95% 95% 95%  Weight:      Height:       Exam Gen alert, tremulous, not in distress, HFNC 7 Lpm No rash, cyanosis or gangrene Sclera anicteric, throat clear  No jvd or bruits Chest bilateral basilar crackles, mild RRR no MRG Abd soft ntnd no mass or ascites +bs GU normal male MS no joint effusions or deformity Ext trace pretib edema, no other edema Neuro is alert, Ox 3 , nf    Home meds:  -  furosemide 20 bid/ Kdur 20   - atorvastatin 20  - ibuprofen 200 qid prn  - metoprolol xl 25  - alfuzosin 10 qd      UA - not done    Renal US - not done    CXR 3/11, 3/13 > bibasilar opacities c/w multifocal infection    IV meds > azithro,  rocephin, remdesivir     No contrast/ acei/ ARB / nsaids  Assessment/ Plan: 1. AoCKD 4 - baseline creat 2.3- 2.6, eGFR 25- 28. Creat up since admit from 2.6 > 2.9.  No renal toxins or hemodynamic changes to explain.  May have COVID- related AKI. Got IV lasix today at noon for rising O2 requirement.  Not sure that he needs lasix, CXR doesn'Davila look wet and minimal pretib edema on exam.  Watch response, am creat and SpO2 in am after lasix to decide on whether to continue diuresing or not. Will follow. Get UA, urine lytes, renal US, daily wts and strict I/O.  2. COVID + PNA - CXR + multifocal infiltrates, 2L [place] > 7L HFNC here over 3 days. Getting usual IV meds.  3. HTN - on metoprolol xl 25 and BP's soft today, will decrease to 12.5 w/ hold orders , also watch BPH med alfusozin, may need hold if BP's drop further.  4. BPH - on alfusozin 5. Mike/o gout - not active      Rob Oluwaseyi Raffel  MD 01/04/2020, 6:40 PM  Recent Labs  Lab 01/03/20 0558 01/04/20 0740  WBC 2.8* 6.6  HGB 12.4* 12.6*   Recent Labs  Lab 01/02/20 1543 01/02/20 1543 01/03/20 0558 01/04/20 0740  K 3.6   < > 4.0 3.9  BUN 27*   < > 35* 58*  CREATININE 2.65*   < > 2.81* 2.99*  CALCIUM 9.0  --  8.9 9.3  PHOS  --   --  3.6 3.5   < > = values in this interval not displayed.

## 2020-01-04 NOTE — Progress Notes (Signed)
Spoke with the Patient's daughter Sharyn Lull and updated her on the plan of her father's care and health status. Daughter seems pleased with discussion and is very thankful for the patient's care and time nurse has spent with him. Ensured family that it is okay to contact about the patient as they need.

## 2020-01-05 ENCOUNTER — Inpatient Hospital Stay (HOSPITAL_COMMUNITY): Payer: Medicare Other

## 2020-01-05 LAB — COMPREHENSIVE METABOLIC PANEL
ALT: 34 U/L (ref 0–44)
AST: 29 U/L (ref 15–41)
Albumin: 3.5 g/dL (ref 3.5–5.0)
Alkaline Phosphatase: 51 U/L (ref 38–126)
Anion gap: 11 (ref 5–15)
BUN: 70 mg/dL — ABNORMAL HIGH (ref 8–23)
CO2: 22 mmol/L (ref 22–32)
Calcium: 9.1 mg/dL (ref 8.9–10.3)
Chloride: 106 mmol/L (ref 98–111)
Creatinine, Ser: 3.05 mg/dL — ABNORMAL HIGH (ref 0.61–1.24)
GFR calc Af Amer: 23 mL/min — ABNORMAL LOW (ref >60)
GFR calc non Af Amer: 19 mL/min — ABNORMAL LOW (ref >60)
Glucose, Bld: 160 mg/dL — ABNORMAL HIGH (ref 70–99)
Potassium: 3.9 mmol/L (ref 3.5–5.1)
Sodium: 139 mmol/L (ref 135–145)
Total Bilirubin: 0.4 mg/dL (ref 0.3–1.2)
Total Protein: 6.9 g/dL (ref 6.5–8.1)

## 2020-01-05 LAB — GLUCOSE, CAPILLARY
Glucose-Capillary: 145 mg/dL — ABNORMAL HIGH (ref 70–99)
Glucose-Capillary: 131 mg/dL — ABNORMAL HIGH (ref 70–99)
Glucose-Capillary: 185 mg/dL — ABNORMAL HIGH (ref 70–99)
Glucose-Capillary: 187 mg/dL — ABNORMAL HIGH (ref 70–99)

## 2020-01-05 LAB — D-DIMER, QUANTITATIVE: D-Dimer, Quant: 0.71 ug/mL-FEU — ABNORMAL HIGH (ref 0.00–0.50)

## 2020-01-05 LAB — CBC WITH DIFFERENTIAL/PLATELET
Abs Immature Granulocytes: 0.05 10*3/uL (ref 0.00–0.07)
Basophils Absolute: 0 10*3/uL (ref 0.0–0.1)
Basophils Relative: 0 %
Eosinophils Absolute: 0 10*3/uL (ref 0.0–0.5)
Eosinophils Relative: 0 %
HCT: 38.1 % — ABNORMAL LOW (ref 39.0–52.0)
Hemoglobin: 12.1 g/dL — ABNORMAL LOW (ref 13.0–17.0)
Immature Granulocytes: 1 %
Lymphocytes Relative: 8 %
Lymphs Abs: 0.5 10*3/uL — ABNORMAL LOW (ref 0.7–4.0)
MCH: 27.1 pg (ref 26.0–34.0)
MCHC: 31.8 g/dL (ref 30.0–36.0)
MCV: 85.2 fL (ref 80.0–100.0)
Monocytes Absolute: 0.5 10*3/uL (ref 0.1–1.0)
Monocytes Relative: 8 %
Neutro Abs: 4.8 10*3/uL (ref 1.7–7.7)
Neutrophils Relative %: 83 %
Platelets: 218 10*3/uL (ref 150–400)
RBC: 4.47 MIL/uL (ref 4.22–5.81)
RDW: 15.5 % (ref 11.5–15.5)
WBC: 5.8 10*3/uL (ref 4.0–10.5)
nRBC: 0 % (ref 0.0–0.2)

## 2020-01-05 LAB — FERRITIN: Ferritin: 1321 ng/mL — ABNORMAL HIGH (ref 24–336)

## 2020-01-05 LAB — C-REACTIVE PROTEIN: CRP: 5.6 mg/dL — ABNORMAL HIGH (ref <1.0)

## 2020-01-05 LAB — PHOSPHORUS: Phosphorus: 4.6 mg/dL (ref 2.5–4.6)

## 2020-01-05 LAB — MAGNESIUM: Magnesium: 2.2 mg/dL (ref 1.7–2.4)

## 2020-01-05 MED ORDER — TOCILIZUMAB 400 MG/20ML IV SOLN
8.0000 mg/kg | Freq: Once | INTRAVENOUS | Status: AC
  Administered 2020-01-05: 738 mg via INTRAVENOUS
  Filled 2020-01-05: qty 36.9

## 2020-01-05 NOTE — Progress Notes (Signed)
PROGRESS NOTE    Mike Davila  DGU:[id number] DOB: [birth year] DOA: 01/02/2020 PCP: [name], MD   Brief Narrative:  Per HPI: Mike Davila a 74 y.o.malewith medical history significant forCKD 4, hypertension,prediabetes. Patient presented to the ED with complaints of 1 week of difficulty breathing, and cough. He also reports some mild diarrhea. Patient spouse tested positive for COVID-19 infection March 1,she was sick but did not have any difficulty breathing,she did not require hospitalization.Patient has not been tested. He tells me he has not been vaccinated because he has not been able to get it/have access to it. He denies chest pain.  3/12:Patient was admitted with acute hypoxemic respiratory failure secondary to Covid pneumonia and has been started on remdesivir and dexamethasone along with Rocephin and azithromycin due to suspected superimposed bacterial infection. Continue current treatment plan and wean oxygen as tolerated.  3/13: Patient noted to have worsening hypoxemia and is now requiring up to 10 L high flow nasal cannula.  He will be transferred to stepdown unit for further monitoring.  Inflammatory markers are downward trending and chest x-ray appears stable.  It appears that he may be having noncardiogenic pulmonary edema for which he will be given some IV Lasix and IV fluid will be further held.  Continue to monitor renal panel closely.  Appreciate nephrology involvement to assist with volume management in the setting of stage IV CKD.  3/14: Patient continues to have persistent hypoxemia requiring high flow nasal cannula.  No respiratory distress noted.  He has had net negative fluid balance of around 600 mL, but does not appear to have benefited from a respiratory standpoint from this.  We will try Actemra today and discontinue IV antibiotics and see if this helps with improvement in his hypoxemia.  No sign of PE currently noted and D-dimer is  downtrending.  Assessment & Plan:   Principal Problem:   Pneumonia due to COVID-19 virus Active Problems:   Hypertension   CKD (chronic kidney disease), stage III   Prediabetes   Acute hypoxemic respiratory failure secondary to COVID-19 pneumonia-persistent -Will administer Actemra today given persistent hypoxemia without improvement on diuretic -Continue to monitor inflammatory markers which are downtrending -Repeat chest x-ray stable -Continue dexamethasone and remdesivir -Continue stepdown unit monitoring -Discontinue Rocephin and azithromycin as patient appears to have primarily viral pneumonia -Continue supportive measures with breathing treatments and mucolytic's -Appears to have component of anxiety for which Xanax has been ordered  Hypertension-stable -Continue home metoprolol and monitor carefully with aggressive diuresis today  AKI on CKD stage4 -Baseline creatinine 2.2-2.6 -Currently up to 3, appreciate nephrology evaluation with no significant findings on renal ultrasound -Avoid further diuretics -Further evaluation pending -Continue to monitor strict I's and O's as well as repeat labs in a.m.  Prediabetes-controlled -Monitor carefully while on steroids -SSI -A1c 6.1%  BPH -Continue home alfuzosin  Anxiety -Xanax as needed   DVT prophylaxis:Heparin Code Status:Full Family Communication: Discussed with daughter Mike Davila Disposition Plan:Continue treatment for COVID-19 pneumonia and monitor closely in stepdown unit.    Will add Actemra today and discontinue antibiotics.  Appreciate nephrology involvement and will consider PCCM by a.m. if not improving.   Consultants:  Nephrology  Procedures:  See below  Antimicrobials:  Anti-infectives (From admission, onward)   Start     Dose/Rate Route Frequency Ordered Stop   01/03/20 1000  remdesivir 100 mg in sodium chloride 0.9 % 100 mL IVPB     100 mg 200 mL/hr over 30 Minutes Intravenous  Daily 01/02/20 1822 01/07/20 0959   01/02/20 2200  remdesivir 100 mg in sodium chloride 0.9 % 100 mL IVPB     100 mg 200 mL/hr over 30 Minutes Intravenous Every 1 hr x 2 01/02/20 2154 01/03/20 0036   01/02/20 2000  remdesivir 200 mg in sodium chloride 0.9% 250 mL IVPB  Status:  Discontinued     200 mg 580 mL/hr over 30 Minutes Intravenous Once 01/02/20 1820 01/02/20 2320   01/02/20 1800  cefTRIAXone (ROCEPHIN) 1 g in sodium chloride 0.9 % 100 mL IVPB  Status:  Discontinued     1 g 200 mL/hr over 30 Minutes Intravenous Every 24 hours 01/02/20 1754 01/05/20 1119   01/02/20 1800  azithromycin (ZITHROMAX) 500 mg in sodium chloride 0.9 % 250 mL IVPB  Status:  Discontinued     500 mg 250 mL/hr over 60 Minutes Intravenous Every 24 hours 01/02/20 1754 01/05/20 1119       Subjective: Patient seen and evaluated today with no new acute complaints or concerns.  He appears slightly anxious and worried about how well he is going to do while he is here.  He continues to remain on high flow nasal cannula oxygen.  No other overnight events noted.  Objective: Vitals:   01/05/20 0900 01/05/20 0934 01/05/20 1000 01/05/20 1100  BP: 129/82 (!) 158/111 122/83 133/79  Pulse:  79 79 64  Resp: (!) 22 (!) 22 (!) 23 15  Temp:    97.7 F (36.5 C)  TempSrc:    Oral  SpO2:  91% 97% 96%  Weight:      Height:        Intake/Output Summary (Last 24 hours) at 01/05/2020 1201 Last data filed at 01/05/2020 1142 Gross per 24 hour  Intake 1007.23 ml  Output 1625 ml  Net -617.77 ml   Filed Weights   01/02/20 1946 01/05/20 0500  Weight: 93.8 kg 92.2 kg    Examination:  General exam: Appears calm and comfortable  Respiratory system: Slightly diminished bilateral bases. Respiratory effort normal.  Currently on 10 L high flow nasal cannula oxygen. Cardiovascular system: S1 & S2 heard, RRR. No JVD, murmurs, rubs, gallops or clicks. No pedal edema. Gastrointestinal system: Abdomen is nondistended, soft and  nontender. No organomegaly or masses felt. Normal bowel sounds heard. Central nervous system: Alert and oriented. No focal neurological deficits. Extremities: Symmetric 5 x 5 power. Skin: No rashes, lesions or ulcers Psychiatry: Judgement and insight appear normal. Mood & affect appropriate.     Data Reviewed: I have personally reviewed following labs and imaging studies  CBC: Recent Labs  Lab 01/02/20 1543 01/03/20 0558 01/04/20 0740 01/05/20 0351  WBC 3.7* 2.8* 6.6 5.8  NEUTROABS 2.9 2.3 5.5 4.8  HGB 12.5* 12.4* 12.6* 12.1*  HCT 40.0 38.8* 39.1 38.1*  MCV 85.7 85.5 84.8 85.2  PLT 173 173 222 979   Basic Metabolic Panel: Recent Labs  Lab 01/02/20 1543 01/03/20 0558 01/04/20 0740 01/05/20 0351  NA 136 135 136 139  K 3.6 4.0 3.9 3.9  CL 105 105 105 106  CO2 21* 18* 18* 22  GLUCOSE 130* 200* 148* 160*  BUN 27* 35* 58* 70*  CREATININE 2.65* 2.81* 2.99* 3.05*  CALCIUM 9.0 8.9 9.3 9.1  MG  --  1.9 2.0 2.2  PHOS  --  3.6 3.5 4.6   GFR: Estimated Creatinine Clearance: 22 mL/min (A) (by C-G formula based on SCr of 3.05 mg/dL (H)). Liver Function Tests: Recent Labs  Lab  01/02/20 1543 01/03/20 0558 01/04/20 0740 01/05/20 0351  AST 40 34 34 29  ALT 41 38 37 34  ALKPHOS 57 55 55 51  BILITOT 0.6 0.7 0.6 0.4  PROT 7.8 7.6 7.4 6.9  ALBUMIN 3.7 3.4* 3.6 3.5   No results for input(s): LIPASE, AMYLASE in the last 168 hours. No results for input(s): AMMONIA in the last 168 hours. Coagulation Profile: No results for input(s): INR, PROTIME in the last 168 hours. Cardiac Enzymes: No results for input(s): CKTOTAL, CKMB, CKMBINDEX, TROPONINI in the last 168 hours. BNP (last 3 results) No results for input(s): PROBNP in the last 8760 hours. HbA1C: Recent Labs    01/02/20 1543  HGBA1C 6.1*   CBG: Recent Labs  Lab 01/04/20 1208 01/04/20 1609 01/04/20 2013 01/05/20 0841 01/05/20 1122  GLUCAP 145* 189* 171* 145* 131*   Lipid Profile: Recent Labs     01/02/20 1544  TRIG 215*   Thyroid Function Tests: No results for input(s): TSH, T4TOTAL, FREET4, T3FREE, THYROIDAB in the last 72 hours. Anemia Panel: Recent Labs    01/04/20 0740 01/05/20 0351  FERRITIN 1,496* 1,321*   Sepsis Labs: Recent Labs  Lab 01/02/20 1543 01/04/20 0740  PROCALCITON 0.35 0.38  LATICACIDVEN 0.9  --     Recent Results (from the past 240 hour(s))  Blood Culture (routine x 2)     Status: None (Preliminary result)   Collection Time: 01/02/20  3:43 PM   Specimen: Right Antecubital; Blood  Result Value Ref Range Status   Specimen Description RIGHT ANTECUBITAL  Final   Special Requests   Final    BOTTLES DRAWN AEROBIC AND ANAEROBIC Blood Culture adequate volume   Culture   Final    NO GROWTH 3 DAYS Performed at [facility], [address], [place], [place] [address]    Report Status PENDING  Incomplete  Blood Culture (routine x 2)     Status: None (Preliminary result)   Collection Time: 01/02/20  3:54 PM   Specimen: Left Antecubital; Blood  Result Value Ref Range Status   Specimen Description LEFT ANTECUBITAL  Final   Special Requests   Final    BOTTLES DRAWN AEROBIC AND ANAEROBIC Blood Culture adequate volume   Culture   Final    NO GROWTH 3 DAYS Performed at [facility], [address] [place], [place] [address]    Report Status PENDING  Incomplete  SARS CORONAVIRUS 2 (TAT 6-24 HRS) Nasopharyngeal Nasopharyngeal Swab     Status: Abnormal   Collection Time: 01/02/20  5:23 PM   Specimen: Nasopharyngeal Swab  Result Value Ref Range Status   SARS Coronavirus 2 POSITIVE (A) NEGATIVE Final    Comment: RESULT CALLED TO, READ BACK BY AND VERIFIED WITH: R. DAWAY,RN 0210 01/03/2020 T. TYSOR (NOTE) SARS-CoV-2 target nucleic acids are DETECTED. The SARS-CoV-2 RNA is generally detectable in upper and lower respiratory specimens during the acute phase of infection. Positive results are indicative of the presence of SARS-CoV-2 RNA. Clinical correlation  with patient history and other diagnostic information is  necessary to determine patient infection status. Positive results do not rule out bacterial infection or co-infection with other viruses.  The expected result is Negative. Fact Sheet for Patients: SugarRoll.be Fact Sheet for Healthcare Providers: https://www.woods-mathews.com/ This test is not yet approved or cleared by the [place] FDA and  has been authorized for detection and/or diagnosis of SARS-CoV-2 by FDA under an Emergency Use Authorization (EUA). This EUA will remain  in effect (meaning this test can  be used) for th e duration of the COVID-19 declaration under Section 564(b)(1) of the Act, 21 U.S.C. section 360bbb-3(b)(1), unless the authorization is terminated or revoked sooner. Performed at [facility], [place] [address] [place], [place] [address]   MRSA PCR Screening     Status: None   Collection Time: 01/04/20  1:50 PM   Specimen: Nasal Mucosa; Nasopharyngeal  Result Value Ref Range Status   MRSA by PCR NEGATIVE NEGATIVE Final    Comment:        The GeneXpert MRSA Assay (FDA approved for NASAL specimens only), is one component of a comprehensive MRSA colonization surveillance program. It is not intended to diagnose MRSA infection nor to guide or monitor treatment for MRSA infections. Performed at [facility], [address], [place], [place] [address]          Radiology Studies: DG Chest 1 View  Result Date: 01/05/2020 CLINICAL DATA:  Hypoxemia, COVID EXAM: CHEST  1 VIEW COMPARISON:  01/04/2020 FINDINGS: The heart size and mediastinal contours are within normal limits. No significant change in heterogeneous bilateral airspace opacity. The visualized skeletal structures are unremarkable. IMPRESSION: No significant change in heterogeneous bilateral airspace opacity, in keeping with COVID-19. Electronically Signed   By: Eddie Candle M.D.   On: 01/05/2020  10:57   DG Chest 1 View  Result Date: 01/04/2020 CLINICAL DATA:  Hypoxemia, COVID positive EXAM: CHEST  1 VIEW COMPARISON:  01/02/2020 FINDINGS: Patchy left upper lobe and bilateral lower lobe opacities, unchanged. No pleural effusion or pneumothorax. The heart is normal in size. IMPRESSION: Stable multifocal pneumonia in this patient with known COVID. Electronically Signed   By: Julian Hy M.D.   On: 01/04/2020 09:10   US RENAL  Result Date: 01/05/2020 CLINICAL DATA:  Renal failure, CKD EXAM: RENAL / URINARY TRACT ULTRASOUND COMPLETE COMPARISON:  06/19/2018 FINDINGS: Right Kidney: Renal measurements: 10.9 x 4.6 x 4.8 cm = volume: 135 mL. Cortical thinning and increased echogenicity. No mass or hydronephrosis visualized. Left Kidney: Renal measurements: 13.4 x 5.9 x 6.2 cm = volume: 205 mL. Cortical thinning and increased echogenicity. Exophytic cyst of the left kidney measuring 4.5 cm. No mass or hydronephrosis visualized. Bladder: Appears normal for degree of bladder distention. Other: Enlarged prostate. IMPRESSION: 1. Bilateral cortical thinning and increased echogenicity, in keeping with medical renal disease. 2.  No hydronephrosis. 3.  Prostatomegaly. Electronically Signed   By: Eddie Candle M.D.   On: 01/05/2020 10:29        Scheduled Meds: . albuterol  2 puff Inhalation Q6H  . alfuzosin  10 mg Oral Q breakfast  . vitamin C  500 mg Oral Daily  . Chlorhexidine Gluconate Cloth  6 each Topical Daily  . dexamethasone (DECADRON) injection  6 mg Intravenous Q24H  . feeding supplement (ENSURE ENLIVE)  237 mL Oral BID BM  . heparin  7,500 Units Subcutaneous Q8H  . insulin aspart  0-5 Units Subcutaneous QHS  . insulin aspart  0-9 Units Subcutaneous TID WC  . mouth rinse  15 mL Mouth Rinse BID  . metoprolol succinate  12.5 mg Oral Daily  . multivitamin with minerals  1 tablet Oral Daily  . saccharomyces boulardii  250 mg Oral BID  . zinc sulfate  220 mg Oral Daily   Continuous  Infusions: . remdesivir 100 mg in NS 100 mL Stopped (01/05/20 0941)  . tocilizumab (ACTEMRA) - non-COVID treatment       LOS: 3 days    Time spent: 40  minutes    Solymar Grace Darleen Crocker, DO [facility] If 7PM-7AM, please contact night-coverage [email or web address] 01/05/2020, 12:01 PM

## 2020-01-05 NOTE — Progress Notes (Signed)
Subjective:  1625 UOP-  Overall negative around 700 s.p lasix 80 iv times one on 3/13 but O2 req seem to be increasing.  crt pretty stable but BUN up from 58--70.  I can see him thru the window- sitting up on side of bed- eating lunch-  No distress   Objective Vital signs in last 24 hours: Vitals:   01/05/20 0934 01/05/20 1000 01/05/20 1100 01/05/20 1200  BP: (!) 158/111 122/83 133/79 118/85  Pulse: 79 79 64 89  Resp: (!) 22 (!) 23 15 17   Temp:   97.7 F (36.5 C)   TempSrc:   Oral   SpO2: 91% 97% 96% 90%  Weight:      Height:       Weight change:   Intake/Output Summary (Last 24 hours) at 01/05/2020 1226 Last data filed at 01/05/2020 1142 Gross per 24 hour  Intake 1007.23 ml  Output 1625 ml  Net -617.77 ml    Assessment/ Plan: Pt is a 74 y.o. yo male with HTN, nephrolithiasis, gout and CKD 4- crt BL low to mid 2's who was admitted on 01/02/2020 with COVID PNA and has developed A on CRF Assessment/Plan: 1. Renal-  Has CKD at baseline-  crt baseline low to mid 2's- reportedly followed by [facility] in the past.  Now with some A on CRF in the setting of COVID PNA.  U/A negative for protein or cells.  U/s-  Smaller right kidney with cortical thinning bilat.  No hydro. Is  Nonoliguric.  NO dialysis needs at present.  BUN likely up due to steroids.  Will continue to follow labs and UOP daily  2. COVID PNA-  Slightly concerning the O2 req going up.  Per primary.  Decadron and remdesivir and tocilizumab 3. HTN/vol-- BNP low, CXR not looking like fluid and no edema per Dr. Jonnie Finner. Got one dose of lasix yest.   Seems like reasonable UOP at present - will not redose.  BP soft, toprol was reduced 4. Anemia-  Does not appear to be an issue    Louis Meckel    Labs: Basic Metabolic Panel: Recent Labs  Lab 01/03/20 0558 01/04/20 0740 01/05/20 0351  NA 135 136 139  K 4.0 3.9 3.9  CL 105 105 106  CO2 18* 18* 22  GLUCOSE 200* 148* 160*  BUN 35* 58* 70*  CREATININE 2.81* 2.99*  3.05*  CALCIUM 8.9 9.3 9.1  PHOS 3.6 3.5 4.6   Liver Function Tests: Recent Labs  Lab 01/03/20 0558 01/04/20 0740 01/05/20 0351  AST 34 34 29  ALT 38 37 34  ALKPHOS 55 55 51  BILITOT 0.7 0.6 0.4  PROT 7.6 7.4 6.9  ALBUMIN 3.4* 3.6 3.5   No results for input(s): LIPASE, AMYLASE in the last 168 hours. No results for input(s): AMMONIA in the last 168 hours. CBC: Recent Labs  Lab 01/02/20 1543 01/02/20 1543 01/03/20 0558 01/04/20 0740 01/05/20 0351  WBC 3.7*   < > 2.8* 6.6 5.8  NEUTROABS 2.9   < > 2.3 5.5 4.8  HGB 12.5*   < > 12.4* 12.6* 12.1*  HCT 40.0   < > 38.8* 39.1 38.1*  MCV 85.7  --  85.5 84.8 85.2  PLT 173   < > 173 222 218   < > = values in this interval not displayed.   Cardiac Enzymes: No results for input(s): CKTOTAL, CKMB, CKMBINDEX, TROPONINI in the last 168 hours. CBG: Recent Labs  Lab 01/04/20 1208 01/04/20 1609 01/04/20  2013 01/05/20 0841 01/05/20 1122  GLUCAP 145* 189* 171* 145* 131*    Iron Studies:  Recent Labs    01/05/20 0351  FERRITIN 1,321*   Studies/Results: DG Chest 1 View  Result Date: 01/05/2020 CLINICAL DATA:  Hypoxemia, COVID EXAM: CHEST  1 VIEW COMPARISON:  01/04/2020 FINDINGS: The heart size and mediastinal contours are within normal limits. No significant change in heterogeneous bilateral airspace opacity. The visualized skeletal structures are unremarkable. IMPRESSION: No significant change in heterogeneous bilateral airspace opacity, in keeping with COVID-19. Electronically Signed   By: Eddie Candle M.D.   On: 01/05/2020 10:57   DG Chest 1 View  Result Date: 01/04/2020 CLINICAL DATA:  Hypoxemia, COVID positive EXAM: CHEST  1 VIEW COMPARISON:  01/02/2020 FINDINGS: Patchy left upper lobe and bilateral lower lobe opacities, unchanged. No pleural effusion or pneumothorax. The heart is normal in size. IMPRESSION: Stable multifocal pneumonia in this patient with known COVID. Electronically Signed   By: Julian Hy M.D.   On:  01/04/2020 09:10   US RENAL  Result Date: 01/05/2020 CLINICAL DATA:  Renal failure, CKD EXAM: RENAL / URINARY TRACT ULTRASOUND COMPLETE COMPARISON:  06/19/2018 FINDINGS: Right Kidney: Renal measurements: 10.9 x 4.6 x 4.8 cm = volume: 135 mL. Cortical thinning and increased echogenicity. No mass or hydronephrosis visualized. Left Kidney: Renal measurements: 13.4 x 5.9 x 6.2 cm = volume: 205 mL. Cortical thinning and increased echogenicity. Exophytic cyst of the left kidney measuring 4.5 cm. No mass or hydronephrosis visualized. Bladder: Appears normal for degree of bladder distention. Other: Enlarged prostate. IMPRESSION: 1. Bilateral cortical thinning and increased echogenicity, in keeping with medical renal disease. 2.  No hydronephrosis. 3.  Prostatomegaly. Electronically Signed   By: Eddie Candle M.D.   On: 01/05/2020 10:29   Medications: Infusions: . remdesivir 100 mg in NS 100 mL Stopped (01/05/20 0941)  . tocilizumab (ACTEMRA) - non-COVID treatment      Scheduled Medications: . albuterol  2 puff Inhalation Q6H  . alfuzosin  10 mg Oral Q breakfast  . vitamin C  500 mg Oral Daily  . Chlorhexidine Gluconate Cloth  6 each Topical Daily  . dexamethasone (DECADRON) injection  6 mg Intravenous Q24H  . feeding supplement (ENSURE ENLIVE)  237 mL Oral BID BM  . heparin  7,500 Units Subcutaneous Q8H  . insulin aspart  0-5 Units Subcutaneous QHS  . insulin aspart  0-9 Units Subcutaneous TID WC  . mouth rinse  15 mL Mouth Rinse BID  . metoprolol succinate  12.5 mg Oral Daily  . multivitamin with minerals  1 tablet Oral Daily  . saccharomyces boulardii  250 mg Oral BID  . zinc sulfate  220 mg Oral Daily    have reviewed scheduled and prn medications.  Physical Exam:  I can see thru the glass door in ICU-  Sitting up eating-  NAD, no inc WOB.  Per Dr. Jonnie Finner last evening no edema    01/05/2020,12:26 PM  LOS: 3 days

## 2020-01-05 NOTE — Progress Notes (Signed)
Upon morning assessment, patient's oxygen sat's were around 84-84% on 6L. Patient O2 sat's didn't come up to 90's until back to 10L. MD made aware that his oxygen was bumped back up. Signing off care to Cydney Ok, RN as we are switching off patient's.

## 2020-01-05 NOTE — Progress Notes (Signed)
Pt ambulated in room, however O2 saturations decreased down to 78% on 10 L HFNC. Once at rest pt required a few minutes to recuperate before coming back up to 90%. Pt left at 9 L HFNC and saturating at 89%. Pt slightly short of breath however stating he feels better today.

## 2020-01-05 NOTE — Progress Notes (Signed)
Throughout shift, patient's oxygen saturation decreased with any exertion rapidly. Patient is also a mouth breather, so when sleeping he snores and has period of apnea causing him to desat when asleep. Patient now at 6L HFNC and is around 93-95%. Continue to monitor.

## 2020-01-06 LAB — CBC WITH DIFFERENTIAL/PLATELET
Abs Immature Granulocytes: 0.07 10*3/uL (ref 0.00–0.07)
Basophils Absolute: 0 10*3/uL (ref 0.0–0.1)
Basophils Relative: 0 %
Eosinophils Absolute: 0 10*3/uL (ref 0.0–0.5)
Eosinophils Relative: 0 %
HCT: 39.1 % (ref 39.0–52.0)
Hemoglobin: 12.3 g/dL — ABNORMAL LOW (ref 13.0–17.0)
Immature Granulocytes: 1 %
Lymphocytes Relative: 9 %
Lymphs Abs: 0.5 10*3/uL — ABNORMAL LOW (ref 0.7–4.0)
MCH: 27.1 pg (ref 26.0–34.0)
MCHC: 31.5 g/dL (ref 30.0–36.0)
MCV: 86.1 fL (ref 80.0–100.0)
Monocytes Absolute: 0.5 10*3/uL (ref 0.1–1.0)
Monocytes Relative: 9 %
Neutro Abs: 4.3 10*3/uL (ref 1.7–7.7)
Neutrophils Relative %: 81 %
Platelets: 225 10*3/uL (ref 150–400)
RBC: 4.54 MIL/uL (ref 4.22–5.81)
RDW: 15.3 % (ref 11.5–15.5)
WBC: 5.3 10*3/uL (ref 4.0–10.5)
nRBC: 0 % (ref 0.0–0.2)

## 2020-01-06 LAB — COMPREHENSIVE METABOLIC PANEL
ALT: 33 U/L (ref 0–44)
AST: 26 U/L (ref 15–41)
Albumin: 3.3 g/dL — ABNORMAL LOW (ref 3.5–5.0)
Alkaline Phosphatase: 53 U/L (ref 38–126)
Anion gap: 9 (ref 5–15)
BUN: 75 mg/dL — ABNORMAL HIGH (ref 8–23)
CO2: 25 mmol/L (ref 22–32)
Calcium: 9.2 mg/dL (ref 8.9–10.3)
Chloride: 106 mmol/L (ref 98–111)
Creatinine, Ser: 2.77 mg/dL — ABNORMAL HIGH (ref 0.61–1.24)
GFR calc Af Amer: 25 mL/min — ABNORMAL LOW (ref >60)
GFR calc non Af Amer: 22 mL/min — ABNORMAL LOW (ref >60)
Glucose, Bld: 161 mg/dL — ABNORMAL HIGH (ref 70–99)
Potassium: 4.3 mmol/L (ref 3.5–5.1)
Sodium: 140 mmol/L (ref 135–145)
Total Bilirubin: 0.5 mg/dL (ref 0.3–1.2)
Total Protein: 6.6 g/dL (ref 6.5–8.1)

## 2020-01-06 LAB — GLUCOSE, CAPILLARY
Glucose-Capillary: 145 mg/dL — ABNORMAL HIGH (ref 70–99)
Glucose-Capillary: 172 mg/dL — ABNORMAL HIGH (ref 70–99)
Glucose-Capillary: 188 mg/dL — ABNORMAL HIGH (ref 70–99)
Glucose-Capillary: 190 mg/dL — ABNORMAL HIGH (ref 70–99)

## 2020-01-06 LAB — C-REACTIVE PROTEIN: CRP: 3.9 mg/dL — ABNORMAL HIGH (ref <1.0)

## 2020-01-06 LAB — PHOSPHORUS: Phosphorus: 4.3 mg/dL (ref 2.5–4.6)

## 2020-01-06 LAB — D-DIMER, QUANTITATIVE: D-Dimer, Quant: 0.62 ug/mL-FEU — ABNORMAL HIGH (ref 0.00–0.50)

## 2020-01-06 LAB — MAGNESIUM: Magnesium: 2.4 mg/dL (ref 1.7–2.4)

## 2020-01-06 LAB — FERRITIN: Ferritin: 950 ng/mL — ABNORMAL HIGH (ref 24–336)

## 2020-01-06 MED ORDER — ALPRAZOLAM 0.25 MG PO TABS
0.2500 mg | ORAL_TABLET | Freq: Two times a day (BID) | ORAL | Status: DC
  Administered 2020-01-06 – 2020-01-14 (×16): 0.25 mg via ORAL
  Filled 2020-01-06 (×16): qty 1

## 2020-01-06 NOTE — Progress Notes (Signed)
PROGRESS NOTE    Mike Davila  TWS:[id number] DOB: 07-20-1946 DOA: 01/02/2020 PCP: [name], MD   Brief Narrative:  Per HPI: Mike Davila a 74 y.o.malewith medical history significant forCKD4, hypertension,prediabetes. Patient presented to the ED with complaints of 1 week of difficulty breathing, and cough. He also reports some mild diarrhea. Patient spouse tested positive for COVID-19 infection March 1,she was sick but did not have any difficulty breathing,she did not require hospitalization.Patient has not been tested. He tells me he has not been vaccinated because he has not been able to get it/have access to it. He denies chest pain.  3/12:Patient was admitted with acute hypoxemic respiratory failure secondary to Covid pneumonia and has been started on remdesivir and dexamethasone along with Rocephin and azithromycin due to suspected superimposed bacterial infection. Continue current treatment plan and wean oxygen as tolerated.  3/13:Patient noted to have worsening hypoxemia and is now requiring up to 10 L high flow nasal cannula. He will be transferred to stepdown unit for further monitoring. Inflammatory markers are downward trending and chest x-ray appears stable. It appears that he may be having noncardiogenic pulmonary edema for which he will be given some IV Lasix and IV fluid will be further held. Continue to monitor renal panel closely. Appreciate nephrology involvement to assist with volume management in the setting of stage IV CKD.  3/14: Patient continues to have persistent hypoxemia requiring high flow nasal cannula.  No respiratory distress noted.  He has had net negative fluid balance of around 600 mL, but does not appear to have benefited from a respiratory standpoint from this.  We will try Actemra today and discontinue IV antibiotics and see if this helps with improvement in his hypoxemia.  No sign of PE currently noted and D-dimer is  downtrending.  3/15: Inflammatory markers continue to downtrend and renal function remained stable with nephrology following.  Actemra given 3/14 and oxygen requirements are starting to decrease this morning.  He continues to feel anxious for which I will schedule Xanax, low-dose twice a day.  Assessment & Plan:   Principal Problem:   Pneumonia due to COVID-19 virus Active Problems:   Hypertension   CKD (chronic kidney disease), stage III   Prediabetes   Acute hypoxemic respiratory failure secondary to COVID-19 pneumonia-persistent -Actemra administered 3/14 -Continue to monitor inflammatory markers whichare downtrending -Repeat chest x-ray stable -Continue dexamethasone and remdesivir -Continue stepdown unit monitoring until oxygen requirements further diminish -Discontinued Rocephin and azithromycin as patient appears to have primarily viral pneumonia -Continue supportive measures with breathing treatments and mucolytic's -Appears to have component of anxiety for which Xanax has been ordered as scheduled and as needed  Hypertension-stable -Continue home metoprololand monitor carefully  AKI on CKD stage4-improving -Baseline creatinine 2.2-2.6 -Currently up to  2.77, appreciate nephrology evaluation with no significant findings on renal ultrasound -Avoid further diuretics -Further evaluation pending -Continue to monitor strict I's and O's as well as repeat labs in a.m.  Prediabetes-controlled -Monitor carefully while on steroids -SSI -A1c 6.1%  BPH -Continue home alfuzosin  Anxiety -Xanax as needed   DVT prophylaxis:Heparin Code Status:Full Family Communication:Discussed with daughter Sharyn Lull and wife Vaughan Basta Disposition Plan:Continue treatment for COVID-19 pneumoniaand monitor closely in stepdown unit.     Continue to monitor oxygen requirements and wean as tolerated.  Appreciate nephrology  involvement.   Consultants:  Nephrology  Procedures:  See below  Antimicrobials:  Anti-infectives (From admission, onward)   Start     Dose/Rate Route Frequency Ordered  Stop   01/03/20 1000  remdesivir 100 mg in sodium chloride 0.9 % 100 mL IVPB     100 mg 200 mL/hr over 30 Minutes Intravenous Daily 01/02/20 1822 01/06/20 0909   01/02/20 2200  remdesivir 100 mg in sodium chloride 0.9 % 100 mL IVPB     100 mg 200 mL/hr over 30 Minutes Intravenous Every 1 hr x 2 01/02/20 2154 01/03/20 0036   01/02/20 2000  remdesivir 200 mg in sodium chloride 0.9% 250 mL IVPB  Status:  Discontinued     200 mg 580 mL/hr over 30 Minutes Intravenous Once 01/02/20 1820 01/02/20 2320   01/02/20 1800  cefTRIAXone (ROCEPHIN) 1 g in sodium chloride 0.9 % 100 mL IVPB  Status:  Discontinued     1 g 200 mL/hr over 30 Minutes Intravenous Every 24 hours 01/02/20 1754 01/05/20 1119   01/02/20 1800  azithromycin (ZITHROMAX) 500 mg in sodium chloride 0.9 % 250 mL IVPB  Status:  Discontinued     500 mg 250 mL/hr over 60 Minutes Intravenous Every 24 hours 01/02/20 1754 01/05/20 1119       Subjective: Patient seen and evaluated today with no new acute complaints or concerns. No acute concerns or events noted overnight.  Oxygen requirements are downtrending today, he continues to remain anxious.  Objective: Vitals:   01/06/20 0818 01/06/20 0900 01/06/20 1100 01/06/20 1200  BP:  (!) 131/101 113/84 114/82  Pulse: 75 82 87 80  Resp: 19 18 19 18   Temp:      TempSrc:      SpO2: 94% (!) 89% 94% 93%  Weight:      Height:        Intake/Output Summary (Last 24 hours) at 01/06/2020 1247 Last data filed at 01/06/2020 1200 Gross per 24 hour  Intake 580 ml  Output 1300 ml  Net -720 ml   Filed Weights   01/02/20 1946 01/05/20 0500 01/06/20 0500  Weight: 93.8 kg 92.2 kg 92.5 kg    Examination:  General exam: Appears calm and comfortable  Respiratory system: Clear to auscultation. Respiratory effort  normal.  Currently on 7 L nasal cannula oxygen. Cardiovascular system: S1 & S2 heard, RRR. No JVD, murmurs, rubs, gallops or clicks. No pedal edema. Gastrointestinal system: Abdomen is nondistended, soft and nontender. No organomegaly or masses felt. Normal bowel sounds heard. Central nervous system: Alert and oriented. No focal neurological deficits. Extremities: Symmetric 5 x 5 power. Skin: No rashes, lesions or ulcers Psychiatry: Worried and anxious.    Data Reviewed: I have personally reviewed following labs and imaging studies  CBC: Recent Labs  Lab 01/02/20 1543 01/03/20 0558 01/04/20 0740 01/05/20 0351 01/06/20 0425  WBC 3.7* 2.8* 6.6 5.8 5.3  NEUTROABS 2.9 2.3 5.5 4.8 4.3  HGB 12.5* 12.4* 12.6* 12.1* 12.3*  HCT 40.0 38.8* 39.1 38.1* 39.1  MCV 85.7 85.5 84.8 85.2 86.1  PLT 173 173 222 218 532   Basic Metabolic Panel: Recent Labs  Lab 01/02/20 1543 01/03/20 0558 01/04/20 0740 01/05/20 0351 01/06/20 0425  NA 136 135 136 139 140  K 3.6 4.0 3.9 3.9 4.3  CL 105 105 105 106 106  CO2 21* 18* 18* 22 25  GLUCOSE 130* 200* 148* 160* 161*  BUN 27* 35* 58* 70* 75*  CREATININE 2.65* 2.81* 2.99* 3.05* 2.77*  CALCIUM 9.0 8.9 9.3 9.1 9.2  MG  --  1.9 2.0 2.2 2.4  PHOS  --  3.6 3.5 4.6 4.3   GFR: Estimated Creatinine Clearance:  24.2 mL/min (A) (by C-G formula based on SCr of 2.77 mg/dL (H)). Liver Function Tests: Recent Labs  Lab 01/02/20 1543 01/03/20 0558 01/04/20 0740 01/05/20 0351 01/06/20 0425  AST 40 34 34 29 26  ALT 41 38 37 34 33  ALKPHOS 57 55 55 51 53  BILITOT 0.6 0.7 0.6 0.4 0.5  PROT 7.8 7.6 7.4 6.9 6.6  ALBUMIN 3.7 3.4* 3.6 3.5 3.3*   No results for input(s): LIPASE, AMYLASE in the last 168 hours. No results for input(s): AMMONIA in the last 168 hours. Coagulation Profile: No results for input(s): INR, PROTIME in the last 168 hours. Cardiac Enzymes: No results for input(s): CKTOTAL, CKMB, CKMBINDEX, TROPONINI in the last 168 hours. BNP (last 3  results) No results for input(s): PROBNP in the last 8760 hours. HbA1C: No results for input(s): HGBA1C in the last 72 hours. CBG: Recent Labs  Lab 01/05/20 1122 01/05/20 1623 01/05/20 2058 01/06/20 0816 01/06/20 1202  GLUCAP 131* 185* 187* 145* 172*   Lipid Profile: No results for input(s): CHOL, HDL, LDLCALC, TRIG, CHOLHDL, LDLDIRECT in the last 72 hours. Thyroid Function Tests: No results for input(s): TSH, T4TOTAL, FREET4, T3FREE, THYROIDAB in the last 72 hours. Anemia Panel: Recent Labs    01/05/20 0351 01/06/20 0425  FERRITIN 1,321* 950*   Sepsis Labs: Recent Labs  Lab 01/02/20 1543 01/04/20 0740  PROCALCITON 0.35 0.38  LATICACIDVEN 0.9  --     Recent Results (from the past 240 hour(s))  Blood Culture (routine x 2)     Status: None (Preliminary result)   Collection Time: 01/02/20  3:43 PM   Specimen: Right Antecubital; Blood  Result Value Ref Range Status   Specimen Description RIGHT ANTECUBITAL  Final   Special Requests   Final    BOTTLES DRAWN AEROBIC AND ANAEROBIC Blood Culture adequate volume   Culture   Final    NO GROWTH 4 DAYS Performed at [facility], [address], [place], [place] [address]    Report Status PENDING  Incomplete  Blood Culture (routine x 2)     Status: None (Preliminary result)   Collection Time: 01/02/20  3:54 PM   Specimen: Left Antecubital; Blood  Result Value Ref Range Status   Specimen Description LEFT ANTECUBITAL  Final   Special Requests   Final    BOTTLES DRAWN AEROBIC AND ANAEROBIC Blood Culture adequate volume   Culture   Final    NO GROWTH 4 DAYS Performed at [facility], [address], [place], [place] [address]    Report Status PENDING  Incomplete  SARS CORONAVIRUS 2 (TAT 6-24 HRS) Nasopharyngeal Nasopharyngeal Swab     Status: Abnormal   Collection Time: 01/02/20  5:23 PM   Specimen: Nasopharyngeal Swab  Result Value Ref Range Status   SARS Coronavirus 2 POSITIVE (A) NEGATIVE Final    Comment: RESULT CALLED  TO, READ BACK BY AND VERIFIED WITH: R. DAWAY,RN 0210 01/03/2020 T. TYSOR (NOTE) SARS-CoV-2 target nucleic acids are DETECTED. The SARS-CoV-2 RNA is generally detectable in upper and lower respiratory specimens during the acute phase of infection. Positive results are indicative of the presence of SARS-CoV-2 RNA. Clinical correlation with patient history and other diagnostic information is  necessary to determine patient infection status. Positive results do not rule out bacterial infection or co-infection with other viruses.  The expected result is Negative. Fact Sheet for Patients: SugarRoll.be Fact Sheet for Healthcare Providers: https://www.woods-mathews.com/ This test is not yet approved or cleared by the [place] FDA and  has been authorized for detection and/or diagnosis of SARS-CoV-2 by FDA under an Emergency Use Authorization (EUA). This EUA will remain  in effect (meaning this test can be used) for th e duration of the COVID-19 declaration under Section 564(b)(1) of the Act, 21 U.S.C. section 360bbb-3(b)(1), unless the authorization is terminated or revoked sooner. Performed at [facility], [place] [address], [place], [place] [address]   MRSA PCR Screening     Status: None   Collection Time: 01/04/20  1:50 PM   Specimen: Nasal Mucosa; Nasopharyngeal  Result Value Ref Range Status   MRSA by PCR NEGATIVE NEGATIVE Final    Comment:        The GeneXpert MRSA Assay (FDA approved for NASAL specimens only), is one component of a comprehensive MRSA colonization surveillance program. It is not intended to diagnose MRSA infection nor to guide or monitor treatment for MRSA infections. Performed at [facility], [address] [place], [place] [address]          Radiology Studies: DG Chest 1 View  Result Date: 01/05/2020 CLINICAL DATA:  Hypoxemia, COVID EXAM: CHEST  1 VIEW COMPARISON:  01/04/2020 FINDINGS: The heart size  and mediastinal contours are within normal limits. No significant change in heterogeneous bilateral airspace opacity. The visualized skeletal structures are unremarkable. IMPRESSION: No significant change in heterogeneous bilateral airspace opacity, in keeping with COVID-19. Electronically Signed   By: Eddie Candle M.D.   On: 01/05/2020 10:57   US RENAL  Result Date: 01/05/2020 CLINICAL DATA:  Renal failure, CKD EXAM: RENAL / URINARY TRACT ULTRASOUND COMPLETE COMPARISON:  06/19/2018 FINDINGS: Right Kidney: Renal measurements: 10.9 x 4.6 x 4.8 cm = volume: 135 mL. Cortical thinning and increased echogenicity. No mass or hydronephrosis visualized. Left Kidney: Renal measurements: 13.4 x 5.9 x 6.2 cm = volume: 205 mL. Cortical thinning and increased echogenicity. Exophytic cyst of the left kidney measuring 4.5 cm. No mass or hydronephrosis visualized. Bladder: Appears normal for degree of bladder distention. Other: Enlarged prostate. IMPRESSION: 1. Bilateral cortical thinning and increased echogenicity, in keeping with medical renal disease. 2.  No hydronephrosis. 3.  Prostatomegaly. Electronically Signed   By: Eddie Candle M.D.   On: 01/05/2020 10:29        Scheduled Meds: . albuterol  2 puff Inhalation Q6H  . alfuzosin  10 mg Oral Q breakfast  . ALPRAZolam  0.25 mg Oral BID  . vitamin C  500 mg Oral Daily  . Chlorhexidine Gluconate Cloth  6 each Topical Daily  . dexamethasone (DECADRON) injection  6 mg Intravenous Q24H  . feeding supplement (ENSURE ENLIVE)  237 mL Oral BID BM  . heparin  7,500 Units Subcutaneous Q8H  . insulin aspart  0-5 Units Subcutaneous QHS  . insulin aspart  0-9 Units Subcutaneous TID WC  . mouth rinse  15 mL Mouth Rinse BID  . metoprolol succinate  12.5 mg Oral Daily  . multivitamin with minerals  1 tablet Oral Daily  . saccharomyces boulardii  250 mg Oral BID  . zinc sulfate  220 mg Oral Daily   Continuous Infusions:   LOS: 4 days    Time spent: 35  minutes    Dartagnan Beavers Darleen Crocker, DO [facility]  If 7PM-7AM, please contact night-coverage [email or web address] 01/06/2020, 12:47 PM

## 2020-01-06 NOTE — Progress Notes (Signed)
Patient ID: Mike Davila, male   DOB: 05-Jul-1946, 74 y.o.   MRN: [id number] S: no events overnight O:BP (!) 131/101   Pulse 82   Temp 98.1 F (36.7 C) (Oral)   Resp 18   Ht 5\' 3"  (1.6 m)   Wt 92.5 kg   SpO2 (!) 89%   BMI 36.12 kg/m   Intake/Output Summary (Last 24 hours) at 01/06/2020 1153 Last data filed at 01/06/2020 0700 Gross per 24 hour  Intake 580 ml  Output 1100 ml  Net -520 ml   Intake/Output: I/O last 3 completed shifts: In: 835.6 [P.O.:600; IV Piggyback:235.6] Out: 1800 [Urine:1800]  Intake/Output this shift:  No intake/output data recorded. Weight change: 0.3 kg TKZ:[id number] through the window to be eating breakfast in NAD Physical exam: unable to complete due to COVID + status.  In order to preserve PPE equipment and to minimize exposure to providers.  Notes from other caregivers reviewed   Recent Labs  Lab 01/02/20 1543 01/03/20 0558 01/04/20 0740 01/05/20 0351 01/06/20 0425  NA 136 135 136 139 140  K 3.6 4.0 3.9 3.9 4.3  CL 105 105 105 106 106  CO2 21* 18* 18* 22 25  GLUCOSE 130* 200* 148* 160* 161*  BUN 27* 35* 58* 70* 75*  CREATININE 2.65* 2.81* 2.99* 3.05* 2.77*  ALBUMIN 3.7 3.4* 3.6 3.5 3.3*  CALCIUM 9.0 8.9 9.3 9.1 9.2  PHOS  --  3.6 3.5 4.6 4.3  AST 40 34 34 29 26  ALT 41 38 37 34 33   Liver Function Tests: Recent Labs  Lab 01/04/20 0740 01/05/20 0351 01/06/20 0425  AST 34 29 26  ALT 37 34 33  ALKPHOS 55 51 53  BILITOT 0.6 0.4 0.5  PROT 7.4 6.9 6.6  ALBUMIN 3.6 3.5 3.3*   No results for input(s): LIPASE, AMYLASE in the last 168 hours. No results for input(s): AMMONIA in the last 168 hours. CBC: Recent Labs  Lab 01/02/20 1543 01/02/20 1543 01/03/20 0558 01/03/20 0558 01/04/20 0740 01/05/20 0351 01/06/20 0425  WBC 3.7*   < > 2.8*   < > 6.6 5.8 5.3  NEUTROABS 2.9   < > 2.3   < > 5.5 4.8 4.3  HGB 12.5*   < > 12.4*   < > 12.6* 12.1* 12.3*  HCT 40.0   < > 38.8*   < > 39.1 38.1* 39.1  MCV 85.7  --  85.5  --  84.8 85.2 86.1   PLT 173   < > 173   < > 222 218 225   < > = values in this interval not displayed.   Cardiac Enzymes: No results for input(s): CKTOTAL, CKMB, CKMBINDEX, TROPONINI in the last 168 hours. CBG: Recent Labs  Lab 01/05/20 0841 01/05/20 1122 01/05/20 1623 01/05/20 2058 01/06/20 0816  GLUCAP 145* 131* 185* 187* 145*    Iron Studies:  Recent Labs    01/06/20 0425  FERRITIN 950*   Studies/Results: DG Chest 1 View  Result Date: 01/05/2020 CLINICAL DATA:  Hypoxemia, COVID EXAM: CHEST  1 VIEW COMPARISON:  01/04/2020 FINDINGS: The heart size and mediastinal contours are within normal limits. No significant change in heterogeneous bilateral airspace opacity. The visualized skeletal structures are unremarkable. IMPRESSION: No significant change in heterogeneous bilateral airspace opacity, in keeping with COVID-19. Electronically Signed   By: Eddie Candle M.D.   On: 01/05/2020 10:57   US RENAL  Result Date: 01/05/2020 CLINICAL DATA:  Renal failure, CKD EXAM: RENAL / URINARY TRACT  ULTRASOUND COMPLETE COMPARISON:  06/19/2018 FINDINGS: Right Kidney: Renal measurements: 10.9 x 4.6 x 4.8 cm = volume: 135 mL. Cortical thinning and increased echogenicity. No mass or hydronephrosis visualized. Left Kidney: Renal measurements: 13.4 x 5.9 x 6.2 cm = volume: 205 mL. Cortical thinning and increased echogenicity. Exophytic cyst of the left kidney measuring 4.5 cm. No mass or hydronephrosis visualized. Bladder: Appears normal for degree of bladder distention. Other: Enlarged prostate. IMPRESSION: 1. Bilateral cortical thinning and increased echogenicity, in keeping with medical renal disease. 2.  No hydronephrosis. 3.  Prostatomegaly. Electronically Signed   By: Eddie Candle M.D.   On: 01/05/2020 10:29   . albuterol  2 puff Inhalation Q6H  . alfuzosin  10 mg Oral Q breakfast  . vitamin C  500 mg Oral Daily  . Chlorhexidine Gluconate Cloth  6 each Topical Daily  . dexamethasone (DECADRON) injection  6 mg  Intravenous Q24H  . feeding supplement (ENSURE ENLIVE)  237 mL Oral BID BM  . heparin  7,500 Units Subcutaneous Q8H  . insulin aspart  0-5 Units Subcutaneous QHS  . insulin aspart  0-9 Units Subcutaneous TID WC  . mouth rinse  15 mL Mouth Rinse BID  . metoprolol succinate  12.5 mg Oral Daily  . multivitamin with minerals  1 tablet Oral Daily  . saccharomyces boulardii  250 mg Oral BID  . zinc sulfate  220 mg Oral Daily    BMET    Component Value Date/Time   NA 140 01/06/2020 0425   K 4.3 01/06/2020 0425   CL 106 01/06/2020 0425   CO2 25 01/06/2020 0425   GLUCOSE 161 (H) 01/06/2020 0425   BUN 75 (H) 01/06/2020 0425   CREATININE 2.77 (H) 01/06/2020 0425   CREATININE 2.26 (H) 10/30/2018 0940   CALCIUM 9.2 01/06/2020 0425   GFRNONAA 22 (L) 01/06/2020 0425   GFRNONAA 28 (L) 10/30/2018 0940   GFRAA 25 (L) 01/06/2020 0425   GFRAA 33 (L) 10/30/2018 0940   CBC    Component Value Date/Time   WBC 5.3 01/06/2020 0425   RBC 4.54 01/06/2020 0425   HGB 12.3 (L) 01/06/2020 0425   HCT 39.1 01/06/2020 0425   PLT 225 01/06/2020 0425   MCV 86.1 01/06/2020 0425   MCH 27.1 01/06/2020 0425   MCHC 31.5 01/06/2020 0425   RDW 15.3 01/06/2020 0425   LYMPHSABS 0.5 (L) 01/06/2020 0425   MONOABS 0.5 01/06/2020 0425   EOSABS 0.0 01/06/2020 0425   BASOSABS 0.0 01/06/2020 0425     Assessment/Plan:  1. AKI/CKD stage 4- in setting of COVID-19 pneumonia and IV diuresis.  Now slowly improving off of IV lasix, although BUN rising presumably due to steroids.  Renal US without obstruction.  UA with chronic microscopic hematuria.  Continue to follow for now.  No indication for dialysis. 2. COVID-19 PNA with persistent hypoxia- off of rocephin and azithromycin, on dexamethasone and remdesivir.  To start Actemra per Dr. Manuella Ghazi. 3. HTN- stable 4. Hyperglycemia- monitor per primary  Donetta Potts, MD [facility] [phone]

## 2020-01-07 ENCOUNTER — Inpatient Hospital Stay (HOSPITAL_COMMUNITY): Payer: Medicare Other

## 2020-01-07 DIAGNOSIS — U071 COVID-19: Principal | ICD-10-CM

## 2020-01-07 DIAGNOSIS — J9601 Acute respiratory failure with hypoxia: Secondary | ICD-10-CM

## 2020-01-07 DIAGNOSIS — J1282 Pneumonia due to coronavirus disease 2019: Secondary | ICD-10-CM

## 2020-01-07 LAB — CBC WITH DIFFERENTIAL/PLATELET
Abs Immature Granulocytes: 0.08 10*3/uL — ABNORMAL HIGH (ref 0.00–0.07)
Basophils Absolute: 0 10*3/uL (ref 0.0–0.1)
Basophils Relative: 0 %
Eosinophils Absolute: 0 10*3/uL (ref 0.0–0.5)
Eosinophils Relative: 0 %
HCT: 40.1 % (ref 39.0–52.0)
Hemoglobin: 12.4 g/dL — ABNORMAL LOW (ref 13.0–17.0)
Immature Granulocytes: 1 %
Lymphocytes Relative: 8 %
Lymphs Abs: 0.5 10*3/uL — ABNORMAL LOW (ref 0.7–4.0)
MCH: 26.7 pg (ref 26.0–34.0)
MCHC: 30.9 g/dL (ref 30.0–36.0)
MCV: 86.4 fL (ref 80.0–100.0)
Monocytes Absolute: 0.3 10*3/uL (ref 0.1–1.0)
Monocytes Relative: 6 %
Neutro Abs: 4.7 10*3/uL (ref 1.7–7.7)
Neutrophils Relative %: 85 %
Platelets: 213 10*3/uL (ref 150–400)
RBC: 4.64 MIL/uL (ref 4.22–5.81)
RDW: 15.4 % (ref 11.5–15.5)
WBC: 5.5 10*3/uL (ref 4.0–10.5)
nRBC: 0 % (ref 0.0–0.2)

## 2020-01-07 LAB — CULTURE, BLOOD (ROUTINE X 2)
Culture: NO GROWTH
Culture: NO GROWTH
Special Requests: ADEQUATE
Special Requests: ADEQUATE

## 2020-01-07 LAB — COMPREHENSIVE METABOLIC PANEL
ALT: 36 U/L (ref 0–44)
AST: 54 U/L — ABNORMAL HIGH (ref 15–41)
Albumin: 3.3 g/dL — ABNORMAL LOW (ref 3.5–5.0)
Alkaline Phosphatase: 54 U/L (ref 38–126)
Anion gap: 11 (ref 5–15)
BUN: 76 mg/dL — ABNORMAL HIGH (ref 8–23)
CO2: 20 mmol/L — ABNORMAL LOW (ref 22–32)
Calcium: 9 mg/dL (ref 8.9–10.3)
Chloride: 107 mmol/L (ref 98–111)
Creatinine, Ser: 2.57 mg/dL — ABNORMAL HIGH (ref 0.61–1.24)
GFR calc Af Amer: 28 mL/min — ABNORMAL LOW (ref >60)
GFR calc non Af Amer: 24 mL/min — ABNORMAL LOW (ref >60)
Glucose, Bld: 175 mg/dL — ABNORMAL HIGH (ref 70–99)
Potassium: 5.9 mmol/L — ABNORMAL HIGH (ref 3.5–5.1)
Sodium: 138 mmol/L (ref 135–145)
Total Bilirubin: 1.8 mg/dL — ABNORMAL HIGH (ref 0.3–1.2)
Total Protein: 6.2 g/dL — ABNORMAL LOW (ref 6.5–8.1)

## 2020-01-07 LAB — BLOOD GAS, ARTERIAL
Acid-base deficit: 1.7 mmol/L (ref 0.0–2.0)
Bicarbonate: 23 mmol/L (ref 20.0–28.0)
FIO2: 80
O2 Saturation: 86.9 %
Patient temperature: 37
pCO2 arterial: 36.7 mmHg (ref 32.0–48.0)
pH, Arterial: 7.402 (ref 7.350–7.450)
pO2, Arterial: 55.8 mmHg — ABNORMAL LOW (ref 83.0–108.0)

## 2020-01-07 LAB — GLUCOSE, CAPILLARY
Glucose-Capillary: 158 mg/dL — ABNORMAL HIGH (ref 70–99)
Glucose-Capillary: 167 mg/dL — ABNORMAL HIGH (ref 70–99)
Glucose-Capillary: 181 mg/dL — ABNORMAL HIGH (ref 70–99)
Glucose-Capillary: 244 mg/dL — ABNORMAL HIGH (ref 70–99)

## 2020-01-07 LAB — POTASSIUM: Potassium: 3.5 mmol/L (ref 3.5–5.1)

## 2020-01-07 LAB — C-REACTIVE PROTEIN: CRP: 2.7 mg/dL — ABNORMAL HIGH (ref <1.0)

## 2020-01-07 LAB — FERRITIN: Ferritin: 724 ng/mL — ABNORMAL HIGH (ref 24–336)

## 2020-01-07 LAB — D-DIMER, QUANTITATIVE: D-Dimer, Quant: 1.29 ug/mL-FEU — ABNORMAL HIGH (ref 0.00–0.50)

## 2020-01-07 LAB — MAGNESIUM: Magnesium: 2.8 mg/dL — ABNORMAL HIGH (ref 1.7–2.4)

## 2020-01-07 LAB — PHOSPHORUS: Phosphorus: 4.1 mg/dL (ref 2.5–4.6)

## 2020-01-07 MED ORDER — SODIUM BICARBONATE 8.4 % IV SOLN
50.0000 meq | Freq: Once | INTRAVENOUS | Status: AC
  Administered 2020-01-07: 50 meq via INTRAVENOUS

## 2020-01-07 MED ORDER — FUROSEMIDE 10 MG/ML IJ SOLN
80.0000 mg | INTRAMUSCULAR | Status: AC
  Administered 2020-01-07: 80 mg via INTRAVENOUS
  Filled 2020-01-07: qty 8

## 2020-01-07 MED ORDER — NEPRO/CARBSTEADY PO LIQD
237.0000 mL | Freq: Two times a day (BID) | ORAL | Status: DC
  Administered 2020-01-07 – 2020-01-10 (×6): 237 mL via ORAL

## 2020-01-07 MED ORDER — SODIUM BICARBONATE 8.4 % IV SOLN
INTRAVENOUS | Status: AC
  Filled 2020-01-07: qty 50

## 2020-01-07 MED ORDER — SODIUM ZIRCONIUM CYCLOSILICATE 10 G PO PACK
10.0000 g | PACK | Freq: Once | ORAL | Status: AC
  Administered 2020-01-07: 10 g via ORAL
  Filled 2020-01-07: qty 1

## 2020-01-07 NOTE — Progress Notes (Signed)
Nephrology Progress Note:   Patient ID: Mike Davila, male   DOB: Mar 23, 1946, 74 y.o.   MRN: [id number]  Subjective:  He had a near fall after trying to reach the toilet with the RN.  Oxygen went to 79% rapidly per charting.  Oxygen was inc to 11.5 liters while moving.  Got lokelma this AM for K.  He had 950 mL UOP and 2 unmeasured urine voids on 3/15.  They state pulm has been consulted but have not yet seen patient.  Oxygen was just increased to 15 L per nursing.   Review of systems:  Reports shortness of breath  No chest pain No n/v States thinks he is emptying but does report some minor difficulty with urination      O:BP 124/79   Pulse 77   Temp 97.8 F (36.6 C) (Axillary)   Resp 16   Ht 5\' 3"  (1.6 m)   Wt 88.5 kg   SpO2 94%   BMI 34.56 kg/m   Intake/Output Summary (Last 24 hours) at 01/07/2020 1010 Last data filed at 01/07/2020 0500 Gross per 24 hour  Intake 240 ml  Output 950 ml  Net -710 ml   Intake/Output: I/O last 3 completed shifts: In: 720 [P.O.:720] Out: 8242 [Urine:1650]  Intake/Output this shift:  No intake/output data recorded. Weight change: -4 kg   Physical exam:  General adult male in bed on 10 liters oxygen - inc to 15 liters just now per nursing instructions per sats.  Low 90's on 15 L now HEENT normocephalic atraumatic extraocular movements intact sclera anicteric Neck supple trachea midline Lungs decreased breath sounds; unlabored with brief speech for me; high supp oxygen requirement as above  Heart S1S2 no rub Abdomen soft nontender nondistended Extremities trace edema GU no foley Psych normal mood and affect Neuro - alert and oriented x 3 follows commands and provides hx    Recent Labs  Lab 01/02/20 1543 01/03/20 0558 01/04/20 0740 01/05/20 0351 01/06/20 0425 01/07/20 0429  NA 136 135 136 139 140 138  K 3.6 4.0 3.9 3.9 4.3 5.9*  CL 105 105 105 106 106 107  CO2 21* 18* 18* 22 25 20*  GLUCOSE 130* 200* 148* 160* 161* 175*  BUN  27* 35* 58* 70* 75* 76*  CREATININE 2.65* 2.81* 2.99* 3.05* 2.77* 2.57*  ALBUMIN 3.7 3.4* 3.6 3.5 3.3* 3.3*  CALCIUM 9.0 8.9 9.3 9.1 9.2 9.0  PHOS  --  3.6 3.5 4.6 4.3 4.1  AST 40 34 34 29 26 54*  ALT 41 38 37 34 33 36   Liver Function Tests: Recent Labs  Lab 01/05/20 0351 01/06/20 0425 01/07/20 0429  AST 29 26 54*  ALT 34 33 36  ALKPHOS 51 53 54  BILITOT 0.4 0.5 1.8*  PROT 6.9 6.6 6.2*  ALBUMIN 3.5 3.3* 3.3*   CBC: Recent Labs  Lab 01/03/20 0558 01/03/20 0558 01/04/20 0740 01/04/20 0740 01/05/20 0351 01/06/20 0425 01/07/20 0429  WBC 2.8*   < > 6.6   < > 5.8 5.3 5.5  NEUTROABS 2.3   < > 5.5   < > 4.8 4.3 4.7  HGB 12.4*   < > 12.6*   < > 12.1* 12.3* 12.4*  HCT 38.8*   < > 39.1   < > 38.1* 39.1 40.1  MCV 85.5  --  84.8  --  85.2 86.1 86.4  PLT 173   < > 222   < > 218 225 213   < > =  values in this interval not displayed.   Cardiac Enzymes: No results for input(s): CKTOTAL, CKMB, CKMBINDEX, TROPONINI in the last 168 hours. CBG: Recent Labs  Lab 01/06/20 0816 01/06/20 1202 01/06/20 1713 01/06/20 2013 01/07/20 0917  GLUCAP 145* 172* 188* 190* 158*    Iron Studies:  Recent Labs    01/07/20 0429  FERRITIN 724*   Studies/Results: DG Chest 1 View  Result Date: 01/05/2020 CLINICAL DATA:  Hypoxemia, COVID EXAM: CHEST  1 VIEW COMPARISON:  01/04/2020 FINDINGS: The heart size and mediastinal contours are within normal limits. No significant change in heterogeneous bilateral airspace opacity. The visualized skeletal structures are unremarkable. IMPRESSION: No significant change in heterogeneous bilateral airspace opacity, in keeping with COVID-19. Electronically Signed   By: Eddie Candle M.D.   On: 01/05/2020 10:57   US RENAL  Result Date: 01/05/2020 CLINICAL DATA:  Renal failure, CKD EXAM: RENAL / URINARY TRACT ULTRASOUND COMPLETE COMPARISON:  06/19/2018 FINDINGS: Right Kidney: Renal measurements: 10.9 x 4.6 x 4.8 cm = volume: 135 mL. Cortical thinning and increased  echogenicity. No mass or hydronephrosis visualized. Left Kidney: Renal measurements: 13.4 x 5.9 x 6.2 cm = volume: 205 mL. Cortical thinning and increased echogenicity. Exophytic cyst of the left kidney measuring 4.5 cm. No mass or hydronephrosis visualized. Bladder: Appears normal for degree of bladder distention. Other: Enlarged prostate. IMPRESSION: 1. Bilateral cortical thinning and increased echogenicity, in keeping with medical renal disease. 2.  No hydronephrosis. 3.  Prostatomegaly. Electronically Signed   By: Eddie Candle M.D.   On: 01/05/2020 10:29   . albuterol  2 puff Inhalation Q6H  . alfuzosin  10 mg Oral Q breakfast  . ALPRAZolam  0.25 mg Oral BID  . vitamin C  500 mg Oral Daily  . Chlorhexidine Gluconate Cloth  6 each Topical Daily  . dexamethasone (DECADRON) injection  6 mg Intravenous Q24H  . feeding supplement (ENSURE ENLIVE)  237 mL Oral BID BM  . heparin  7,500 Units Subcutaneous Q8H  . insulin aspart  0-5 Units Subcutaneous QHS  . insulin aspart  0-9 Units Subcutaneous TID WC  . mouth rinse  15 mL Mouth Rinse BID  . metoprolol succinate  12.5 mg Oral Daily  . multivitamin with minerals  1 tablet Oral Daily  . saccharomyces boulardii  250 mg Oral BID  . zinc sulfate  220 mg Oral Daily    BMET    Component Value Date/Time   NA 138 01/07/2020 0429   K 5.9 (H) 01/07/2020 0429   CL 107 01/07/2020 0429   CO2 20 (L) 01/07/2020 0429   GLUCOSE 175 (H) 01/07/2020 0429   BUN 76 (H) 01/07/2020 0429   CREATININE 2.57 (H) 01/07/2020 0429   CREATININE 2.26 (H) 10/30/2018 0940   CALCIUM 9.0 01/07/2020 0429   GFRNONAA 24 (L) 01/07/2020 0429   GFRNONAA 28 (L) 10/30/2018 0940   GFRAA 28 (L) 01/07/2020 0429   GFRAA 33 (L) 10/30/2018 0940   CBC    Component Value Date/Time   WBC 5.5 01/07/2020 0429   RBC 4.64 01/07/2020 0429   HGB 12.4 (L) 01/07/2020 0429   HCT 40.1 01/07/2020 0429   PLT 213 01/07/2020 0429   MCV 86.4 01/07/2020 0429   MCH 26.7 01/07/2020 0429   MCHC  30.9 01/07/2020 0429   RDW 15.4 01/07/2020 0429   LYMPHSABS 0.5 (L) 01/07/2020 0429   MONOABS 0.3 01/07/2020 0429   EOSABS 0.0 01/07/2020 0429   BASOSABS 0.0 01/07/2020 0429     Assessment/Plan:  1. AKI/CKD stage 4- in setting of COVID-19 pneumonia and IV diuresis.  Renal US without obstruction.  UA with chronic microscopic hematuria per report though note no RBC on UA.  - Cr improving although BUN up presumably due to steroids.   - Continue to follow for now.  No indication for dialysis.  Temporizing K - lasix IV once now  - check post-void residual and low threshold for foley - order placed  2. CKD stage 4 - note CKD which pre-dates admission  3. Acute hypoxic respiratory failure.  Lasix 80 mg IV once stat.  on supplemental oxygen. With covid. Optimize volume as able  4. COVID-19 PNA with persistent hypoxia- therapies per primary team   5. Hyperkalemia s/p lokelma; ordered sodium bicarb and repeat K.  Changed his supplement to nepro 6. HTN- stable   7. Hyperglycemia- monitor per primary  Disposition per primary team.  Agree with pulm consult.  May need to transfer to [facility] - defer to primary and pulm as primarily resp issue  Claudia Desanctis, MD [place] 01/07/2020 10:42 AM

## 2020-01-07 NOTE — Care Management Important Message (Signed)
Important Message  Patient Details  Name: Mike Davila MRN: [id number] Date of Birth: 1946/06/30   Medicare Important Message Given:  Yes     [name] 01/07/2020, 3:47 PM

## 2020-01-07 NOTE — Progress Notes (Signed)
PROGRESS NOTE    Mike Davila  CLE:[id number] DOB: 03/20/1946 DOA: 01/02/2020 PCP: [name], MD   Brief Narrative:  Per HPI: Mike Davila a 74 y.o.malewith medical history significant forCKD4, hypertension,prediabetes. Patient presented to the ED with complaints of 1 week of difficulty breathing, and cough. He also reports some mild diarrhea. Patient spouse tested positive for COVID-19 infection March 1,she was sick but did not have any difficulty breathing,she did not require hospitalization.Patient has not been tested. He tells me he has not been vaccinated because he has not been able to get it/have access to it. He denies chest pain.  3/12:Patient was admitted with acute hypoxemic respiratory failure secondary to Covid pneumonia and has been started on remdesivir and dexamethasone along with Rocephin and azithromycin due to suspected superimposed bacterial infection. Continue current treatment plan and wean oxygen as tolerated.  3/13:Patient noted to have worsening hypoxemia and is now requiring up to 10 L high flow nasal cannula. He will be transferred to stepdown unit for further monitoring. Inflammatory markers are downward trending and chest x-ray appears stable. It appears that he may be having noncardiogenic pulmonary edema for which he will be given some IV Lasix and IV fluid will be further held. Continue to monitor renal panel closely. Appreciate nephrology involvement to assist with volume management in the setting of stage IV CKD.  3/14:Patient continues to have persistent hypoxemia requiring high flow nasal cannula. No respiratory distress noted. He has had net negative fluid balance of around 600 mL, but does not appear to have benefited from a respiratory standpoint from this. We will try Actemra today and discontinue IV antibiotics and see if this helps with improvement in his hypoxemia. No sign of PE currently noted and D-dimer is  downtrending.  3/15: Inflammatory markers continue to downtrend and renal function remained stable with nephrology following.  Actemra given 3/14 and oxygen requirements are starting to decrease this morning.  He continues to feel anxious for which I will schedule Xanax, low-dose twice a day.  3/16: Patient does not appear to be progressing despite Covid treatment and is persistently hypoxemic and even more so now this morning requiring 15 L high flow nasal cannula oxygen.  Pulmonology consult ordered and appreciated.  Chest x-ray with no new findings and ABG with worsening hypoxemic failure noted.  Seen by nephrology and given 1 ampoule of bicarbonate as well as 1 dose of 80 mg IV Lasix this morning.  Assessment & Plan:   Principal Problem:   Pneumonia due to COVID-19 virus Active Problems:   Hypertension   CKD (chronic kidney disease), stage III   Prediabetes   Acute hypoxemic respiratory failure secondary to COVID-19 pneumonia-worsening -Actemra administered 3/14 -Inflammatory markers have down trended -Repeat chest x-ray stable today -Continue dexamethasone and remdesivir discontinued after total 5-day course -Continue stepdown unit monitoring  for now and appreciate pulmonology consultation -Continue supportive measures with breathing treatments and mucolytic's -Appears to have component of anxiety for which Xanax has been ordered as scheduled and as needed  Hypertension-stable -Continue home metoprololand monitor carefully  AKI onCKD stage4-improved -Baseline creatinine 2.2-2.6 -Currently at 2.57,appreciate nephrology evaluation with IV Lasix and bicarbonate given today -Further evaluation pending -Continue to monitor strict I's and O's as well as repeat labs in a.m., negative fluid balance noted  Prediabetes-controlled -Monitor carefully while on steroids -SSI -A1c 6.1%  BPH -Continue home alfuzosin  Anxiety -Xanax as needed   DVT  prophylaxis:Heparin Code Status:Full Family Communication:Discussed with daughter Sharyn Lull  and wife Vaughan Basta Disposition Plan:Continue close monitoring as patient is in critical condition with high oxygen requirements.  Appreciate pulmonology evaluation.   Consultants:  Nephrology  Pulmonology  Procedures:  See below  Antimicrobials:  Anti-infectives (From admission, onward)   Start     Dose/Rate Route Frequency Ordered Stop   01/03/20 1000  remdesivir 100 mg in sodium chloride 0.9 % 100 mL IVPB     100 mg 200 mL/hr over 30 Minutes Intravenous Daily 01/02/20 1822 01/06/20 0909   01/02/20 2200  remdesivir 100 mg in sodium chloride 0.9 % 100 mL IVPB     100 mg 200 mL/hr over 30 Minutes Intravenous Every 1 hr x 2 01/02/20 2154 01/03/20 0036   01/02/20 2000  remdesivir 200 mg in sodium chloride 0.9% 250 mL IVPB  Status:  Discontinued     200 mg 580 mL/hr over 30 Minutes Intravenous Once 01/02/20 1820 01/02/20 2320   01/02/20 1800  cefTRIAXone (ROCEPHIN) 1 g in sodium chloride 0.9 % 100 mL IVPB  Status:  Discontinued     1 g 200 mL/hr over 30 Minutes Intravenous Every 24 hours 01/02/20 1754 01/05/20 1119   01/02/20 1800  azithromycin (ZITHROMAX) 500 mg in sodium chloride 0.9 % 250 mL IVPB  Status:  Discontinued     500 mg 250 mL/hr over 60 Minutes Intravenous Every 24 hours 01/02/20 1754 01/05/20 1119       Subjective: Patient seen and evaluated today with no new acute complaints or concerns overnight, however his oxygen requirements continued to go upwards.  He denies any respiratory distress or chest pain.  Objective: Vitals:   01/07/20 1000 01/07/20 1041 01/07/20 1100 01/07/20 1200  BP: 117/79  122/87 135/87  Pulse: 76  77 76  Resp: 20  16 19   Temp:   97.6 F (36.4 C)   TempSrc:   Oral   SpO2: (!) 85% 90% 90% 91%  Weight:      Height:        Intake/Output Summary (Last 24 hours) at 01/07/2020 1359 Last data filed at 01/07/2020 1000 Gross per 24 hour   Intake 360 ml  Output 900 ml  Net -540 ml   Filed Weights   01/05/20 0500 01/06/20 0500 01/07/20 0500  Weight: 92.2 kg 92.5 kg 88.5 kg    Examination:  General exam: Appears mildly anxious Respiratory system: Clear to auscultation. Respiratory effort normal.  Currently on 15 L high flow nasal cannula oxygen. Cardiovascular system: S1 & S2 heard, RRR. No JVD, murmurs, rubs, gallops or clicks. No pedal edema. Gastrointestinal system: Abdomen is nondistended, soft and nontender. No organomegaly or masses felt. Normal bowel sounds heard. Central nervous system: Alert and oriented. No focal neurological deficits. Extremities: Symmetric 5 x 5 power.  No edema Skin: No rashes, lesions or ulcers Psychiatry: Anxious and worried    Data Reviewed: I have personally reviewed following labs and imaging studies  CBC: Recent Labs  Lab 01/03/20 0558 01/04/20 0740 01/05/20 0351 01/06/20 0425 01/07/20 0429  WBC 2.8* 6.6 5.8 5.3 5.5  NEUTROABS 2.3 5.5 4.8 4.3 4.7  HGB 12.4* 12.6* 12.1* 12.3* 12.4*  HCT 38.8* 39.1 38.1* 39.1 40.1  MCV 85.5 84.8 85.2 86.1 86.4  PLT 173 222 218 225 578   Basic Metabolic Panel: Recent Labs  Lab 01/03/20 0558 01/03/20 0558 01/04/20 0740 01/05/20 0351 01/06/20 0425 01/07/20 0429 01/07/20 1309  NA 135  --  136 139 140 138  --   K 4.0   < >  3.9 3.9 4.3 5.9* 3.5  CL 105  --  105 106 106 107  --   CO2 18*  --  18* 22 25 20*  --   GLUCOSE 200*  --  148* 160* 161* 175*  --   BUN 35*  --  58* 70* 75* 76*  --   CREATININE 2.81*  --  2.99* 3.05* 2.77* 2.57*  --   CALCIUM 8.9  --  9.3 9.1 9.2 9.0  --   MG 1.9  --  2.0 2.2 2.4 2.8*  --   PHOS 3.6  --  3.5 4.6 4.3 4.1  --    < > = values in this interval not displayed.   GFR: Estimated Creatinine Clearance: 25.5 mL/min (A) (by C-G formula based on SCr of 2.57 mg/dL (H)). Liver Function Tests: Recent Labs  Lab 01/03/20 0558 01/04/20 0740 01/05/20 0351 01/06/20 0425 01/07/20 0429  AST 34 34 29 26  54*  ALT 38 37 34 33 36  ALKPHOS 55 55 51 53 54  BILITOT 0.7 0.6 0.4 0.5 1.8*  PROT 7.6 7.4 6.9 6.6 6.2*  ALBUMIN 3.4* 3.6 3.5 3.3* 3.3*   No results for input(s): LIPASE, AMYLASE in the last 168 hours. No results for input(s): AMMONIA in the last 168 hours. Coagulation Profile: No results for input(s): INR, PROTIME in the last 168 hours. Cardiac Enzymes: No results for input(s): CKTOTAL, CKMB, CKMBINDEX, TROPONINI in the last 168 hours. BNP (last 3 results) No results for input(s): PROBNP in the last 8760 hours. HbA1C: No results for input(s): HGBA1C in the last 72 hours. CBG: Recent Labs  Lab 01/06/20 1202 01/06/20 1713 01/06/20 2013 01/07/20 0917 01/07/20 1108  GLUCAP 172* 188* 190* 158* 167*   Lipid Profile: No results for input(s): CHOL, HDL, LDLCALC, TRIG, CHOLHDL, LDLDIRECT in the last 72 hours. Thyroid Function Tests: No results for input(s): TSH, T4TOTAL, FREET4, T3FREE, THYROIDAB in the last 72 hours. Anemia Panel: Recent Labs    01/06/20 0425 01/07/20 0429  FERRITIN 950* 724*   Sepsis Labs: Recent Labs  Lab 01/02/20 1543 01/04/20 0740  PROCALCITON 0.35 0.38  LATICACIDVEN 0.9  --     Recent Results (from the past 240 hour(s))  Blood Culture (routine x 2)     Status: None   Collection Time: 01/02/20  3:43 PM   Specimen: Right Antecubital; Blood  Result Value Ref Range Status   Specimen Description RIGHT ANTECUBITAL  Final   Special Requests   Final    BOTTLES DRAWN AEROBIC AND ANAEROBIC Blood Culture adequate volume   Culture   Final    NO GROWTH 5 DAYS Performed at [facility], [address] [place], [place] [address]    Report Status 01/07/2020 FINAL  Final  Blood Culture (routine x 2)     Status: None   Collection Time: 01/02/20  3:54 PM   Specimen: Left Antecubital; Blood  Result Value Ref Range Status   Specimen Description LEFT ANTECUBITAL  Final   Special Requests   Final    BOTTLES DRAWN AEROBIC AND ANAEROBIC Blood Culture adequate  volume   Culture   Final    NO GROWTH 5 DAYS Performed at [facility], [address] [place], [place] [address]    Report Status 01/07/2020 FINAL  Final  SARS CORONAVIRUS 2 (TAT 6-24 HRS) Nasopharyngeal Nasopharyngeal Swab     Status: Abnormal   Collection Time: 01/02/20  5:23 PM   Specimen: Nasopharyngeal Swab  Result Value Ref Range Status  SARS Coronavirus 2 POSITIVE (A) NEGATIVE Final    Comment: RESULT CALLED TO, READ BACK BY AND VERIFIED WITH: R. DAWAY,RN 0210 01/03/2020 T. TYSOR (NOTE) SARS-CoV-2 target nucleic acids are DETECTED. The SARS-CoV-2 RNA is generally detectable in upper and lower respiratory specimens during the acute phase of infection. Positive results are indicative of the presence of SARS-CoV-2 RNA. Clinical correlation with patient history and other diagnostic information is  necessary to determine patient infection status. Positive results do not rule out bacterial infection or co-infection with other viruses.  The expected result is Negative. Fact Sheet for Patients: SugarRoll.be Fact Sheet for Healthcare Providers: https://www.woods-mathews.com/ This test is not yet approved or cleared by the [place] FDA and  has been authorized for detection and/or diagnosis of SARS-CoV-2 by FDA under an Emergency Use Authorization (EUA). This EUA will remain  in effect (meaning this test can be used) for th e duration of the COVID-19 declaration under Section 564(b)(1) of the Act, 21 U.S.C. section 360bbb-3(b)(1), unless the authorization is terminated or revoked sooner. Performed at [facility], [place] [address], [place], [place] [address]   MRSA PCR Screening     Status: None   Collection Time: 01/04/20  1:50 PM   Specimen: Nasal Mucosa; Nasopharyngeal  Result Value Ref Range Status   MRSA by PCR NEGATIVE NEGATIVE Final    Comment:        The GeneXpert MRSA Assay (FDA approved for NASAL specimens only), is  one component of a comprehensive MRSA colonization surveillance program. It is not intended to diagnose MRSA infection nor to guide or monitor treatment for MRSA infections. Performed at [facility], [address] [place],  [address]          Radiology Studies: [facility] Chest [facility] 1 View  Result Date: 01/07/2020 CLINICAL DATA:  Hypoxemia. Additional history provided: COVID positive 5 days ago. EXAM: PORTABLE CHEST 1 VIEW COMPARISON:  Chest radiograph 01/05/2020 FINDINGS: Heart size within normal limits. Similar appearance of heterogeneous bilateral airspace opacities throughout both lungs. No evidence of pleural effusion or pneumothorax. No acute bony abnormality. Overlying cardiac monitoring leads. IMPRESSION: No significant interval change in heterogeneous bilateral airspace disease. Findings are consistent with COVID pneumonia given the provided history. Electronically Signed   By: Kellie Simmering DO   On: 01/07/2020 11:36        Scheduled Meds: . albuterol  2 puff Inhalation Q6H  . alfuzosin  10 mg Oral Q breakfast  . ALPRAZolam  0.25 mg Oral BID  . vitamin C  500 mg Oral Daily  . Chlorhexidine Gluconate Cloth  6 each Topical Daily  . dexamethasone (DECADRON) injection  6 mg Intravenous Q24H  . feeding supplement (NEPRO CARB STEADY)  237 mL Oral BID BM  . heparin  7,500 Units Subcutaneous Q8H  . insulin aspart  0-5 Units Subcutaneous QHS  . insulin aspart  0-9 Units Subcutaneous TID WC  . mouth rinse  15 mL Mouth Rinse BID  . metoprolol succinate  12.5 mg Oral Daily  . multivitamin with minerals  1 tablet Oral Daily  . saccharomyces boulardii  250 mg Oral BID  . zinc sulfate  220 mg Oral Daily   Continuous Infusions:   LOS: 5 days    Time spent: 40 minutes    Bellanie Matthew Darleen Crocker, DO [facility]  If 7PM-7AM, please contact night-coverage [email or web address] 01/07/2020, 1:59 PM

## 2020-01-07 NOTE — Consult Note (Signed)
Name: Mike Davila MRN: [id number] DOB: January 01, 1946    ADMISSION DATE:  01/02/2020 CONSULTATION DATE:  01/07/2020  REFERRING MD :  Hassell Halim   CHIEF COMPLAINT: Respiratory distress, hypoxia  BRIEF PATIENT DESCRIPTION: 74 year old man with hypertension and CKD who was admitted for Covid pneumonia and hypoxic respiratory failure .PCCM consulted for  persistent hypoxia  SIGNIFICANT EVENTS    STUDIES:  Echo 10/2017 normal LV function    HISTORY OF PRESENT ILLNESS: 74 year old man with hypertension, prediabetes and CKD stage IV admitted 3/12 for shortness of breath and cough.  His wife tested positive for Covid on 3/1 but did not require hospitalization.  He tested positive and was started has completed dexamethasone, also treated with ceftriaxone and azithromycin. On 3/13, he was more hypoxic requiring 10 L high flow nasal cannula He was given Actemra on 3/14, peak CRP was 15, D-dimer is 1.29  On 3/16 he required 15 L nasal cannula, renal was consulted 80 mg Lasix given. He reports cough with minimal white sputum production I have reviewed the treatment consultation hospitalist notes Serial imaging personally reviewed which shows predominantly bibasilar infiltrates which have remained stable  PAST MEDICAL HISTORY :   has a past medical history of Anemia, Benign localized prostatic hyperplasia with lower urinary tract symptoms (LUTS), CKD (chronic kidney disease), stage III, Diverticulosis of colon, ED (erectile dysfunction), History of acute gouty arthritis (10/23/2017), History of acute renal failure (10/23/2017), History of diverticulitis of colon (10/05/2017), History of metabolic acidosis (40/98/1191), History of palpitations, History of urinary retention (10/2017), Hypertension, Incomplete right bundle branch block, Mixed hyperlipidemia, Nephrolithiasis, Pre-diabetes, Right ureteral stone, and Wears dentures.  has a past surgical history that includes Ankle surgery (Bilateral,  1990s to early 2000s); Plantar fascia surgery (Left, 2004); Colonoscopy (N/A, 10/27/2017); transthoracic echocardiogram (10/24/2017); Tonsillectomy (child); Appendectomy (child); Hematoma evacuation (1990s); Inguinal hernia repair (Bilateral, age 33s); and Cystoscopy/ureteroscopy/holmium laser/stent placement (Right, 05/09/2018). Prior to Admission medications   Medication Sig Start Date End Date Taking? Authorizing Provider  alfuzosin (UROXATRAL) 10 MG 24 hr tablet Take 10 mg by mouth daily with breakfast.   Yes [provider]  ibuprofen (ADVIL,MOTRIN) 200 MG tablet Take 200 mg by mouth every 6 (six) hours as needed.   Yes [provider]  metoprolol succinate (TOPROL-XL) 25 MG 24 hr tablet Take 1 tablet (25 mg total) by mouth daily. Needs office visit and labs before further refills Patient taking differently: Take 25 mg by mouth daily.  12/23/19  Yes [name], MD  atorvastatin (LIPITOR) 20 MG tablet Take 1 tablet (20 mg total) by mouth daily. 11/07/18   [name], MD  furosemide (LASIX) 20 MG tablet Take 20 mg by mouth 2 (two) times daily.     [provider]  potassium chloride SA (K-DUR,KLOR-CON) 20 MEQ tablet Take 1 tablet (20 mEq total) by mouth daily for 3 days. Patient not taking: Reported on 01/03/2020 11/07/18 11/10/18  [name], MD   Allergies  Allergen Reactions  . Sulfa Antibiotics Hives and Shortness Of Breath  . Crestor [Rosuvastatin Calcium] Other (See Comments)    Leg cramps  . Lipitor [Atorvastatin Calcium] Other (See Comments)    Leg cramps  . Tetanus Toxoids Other (See Comments)    Unknown reaction    FAMILY HISTORY:  family history includes Benign prostatic hyperplasia in his brother; Colon cancer in his father; Diabetes Mellitus II in his maternal grandmother; Heart disease in his father; Melanoma in his father; Ovarian cancer in his  daughter; Thyroid cancer in his mother. SOCIAL HISTORY:  reports that he quit smoking about  13 years ago. His smoking use included cigarettes. He quit after 20.00 years of use. He quit smokeless tobacco use about 2 years ago.  His smokeless tobacco use included chew. He reports that he does not drink alcohol or use drugs.  REVIEW OF SYSTEMS:   Constitutional: Negative for weight loss, malaise/fatigue and diaphoresis.  HENT: Negative for hearing loss, ear pain, nosebleeds, congestion,  neck pain, tinnitus and ear discharge.   Eyes: Negative for blurred vision, double vision, photophobia, pain, discharge and redness.  Respiratory: Negative for hemoptysis, wheezing and stridor.   Cardiovascular: Negative for chest pain, palpitations, orthopnea, claudication, and PND.  Gastrointestinal: Negative for heartburn, nausea, vomiting, abdominal pain, diarrhea, constipation, blood in stool and melena.  Genitourinary: Negative for dysuria, urgency, frequency, hematuria and flank pain.  Musculoskeletal: Negative for myalgias, back pain, joint pain and falls.  Skin: Negative for itching and rash.  Neurological: Negative for dizziness, tingling, tremors, sensory change, speech change, focal weakness, seizures, loss of consciousness, weakness and headaches.  Endo/Heme/Allergies: Negative for environmental allergies and polydipsia. Does not bruise/bleed easily.  SUBJECTIVE:   VITAL SIGNS: Temp:  [97.6 F (36.4 C)-98.1 F (36.7 C)] 97.6 F (36.4 C) (03/16 1100) Pulse Rate:  [56-92] 78 (03/16 1500) Resp:  [15-28] 20 (03/16 1500) BP: (100-157)/(66-92) 123/88 (03/16 1500) SpO2:  [83 %-98 %] 94 % (03/16 1500) Weight:  [88.5 kg] 88.5 kg (03/16 0500)  PHYSICAL EXAMINATION: Gen. Pleasant, well-nourished, in no distress, normal affect ENT - no pallor,icterus, no post nasal drip Neck: No JVD, no thyromegaly, no carotid bruits Lungs: no use of accessory muscles, no dullness to percussion, clear without rales or rhonchi  Cardiovascular: Rhythm regular, heart sounds  normal, no murmurs or gallops, no  peripheral edema Abdomen: big belly, soft and non-tender, no hepatosplenomegaly, BS normal. Musculoskeletal: No deformities, no cyanosis or clubbing Neuro:  alert, non focal , tremors+, mild cog wheel rigidity at wrist   Recent Labs  Lab 01/05/20 0351 01/05/20 0351 01/06/20 0425 01/07/20 0429 01/07/20 1309  NA 139  --  140 138  --   K 3.9   < > 4.3 5.9* 3.5  CL 106  --  106 107  --   CO2 22  --  25 20*  --   BUN 70*  --  75* 76*  --   CREATININE 3.05*  --  2.77* 2.57*  --   GLUCOSE 160*  --  161* 175*  --    < > = values in this interval not displayed.   Recent Labs  Lab 01/05/20 0351 01/06/20 0425 01/07/20 0429  HGB 12.1* 12.3* 12.4*  HCT 38.1* 39.1 40.1  WBC 5.8 5.3 5.5  PLT 218 225 213   DG Chest Port 1 View  Result Date: 01/07/2020 CLINICAL DATA:  Hypoxemia. Additional history provided: COVID positive 5 days ago. EXAM: PORTABLE CHEST 1 VIEW COMPARISON:  Chest radiograph 01/05/2020 FINDINGS: Heart size within normal limits. Similar appearance of heterogeneous bilateral airspace opacities throughout both lungs. No evidence of pleural effusion or pneumothorax. No acute bony abnormality. Overlying cardiac monitoring leads. IMPRESSION: No significant interval change in heterogeneous bilateral airspace disease. Findings are consistent with COVID pneumonia given the provided history. Electronically Signed   By: Kellie Simmering DO   On: 01/07/2020 11:36    ASSESSMENT / PLAN:  Acute hypoxic respiratory failure due to Covid pneumonia  -Diuresed well with Lasix today but still  remains hypoxic.  Currently on 12-15  L hi flow nasal cannula -Has received Actemra and remdesivir -Comorbidities include diabetes, hypertension and CKD  Recommend -Continue dexamethasone -Continue high flow nasal cannula, oxygen saturation 88 to 92% acceptable -Encourage self proning or at least turn to side as much as possible -Incentive spirometry -No evidence of superinfection at this time, D-dimer  is low, proceed with venous duplex   Kara Mead MD. FCCP. [facility]  If no response to pager , please call [phone]     01/07/2020, 3:52 PM

## 2020-01-07 NOTE — Progress Notes (Signed)
Patient up to [facility] due to being to weak and nearly falling trying to reach the toilet with the RN. Patient become extremely SOB and oxygen saturation dropped to 79% rapidly. O2 increased to 11.5L while moving and transferring from bed to [facility]. Continue to monitor. Patient is weak, unsteady on feet, and now high fall risk.

## 2020-01-08 DIAGNOSIS — J9601 Acute respiratory failure with hypoxia: Secondary | ICD-10-CM

## 2020-01-08 LAB — RENAL FUNCTION PANEL
Albumin: 3.6 g/dL (ref 3.5–5.0)
Anion gap: 14 (ref 5–15)
BUN: 77 mg/dL — ABNORMAL HIGH (ref 8–23)
CO2: 24 mmol/L (ref 22–32)
Calcium: 9.2 mg/dL (ref 8.9–10.3)
Chloride: 102 mmol/L (ref 98–111)
Creatinine, Ser: 2.65 mg/dL — ABNORMAL HIGH (ref 0.61–1.24)
GFR calc Af Amer: 27 mL/min — ABNORMAL LOW (ref >60)
GFR calc non Af Amer: 23 mL/min — ABNORMAL LOW (ref >60)
Glucose, Bld: 159 mg/dL — ABNORMAL HIGH (ref 70–99)
Phosphorus: 3.6 mg/dL (ref 2.5–4.6)
Potassium: 3.5 mmol/L (ref 3.5–5.1)
Sodium: 140 mmol/L (ref 135–145)

## 2020-01-08 LAB — GLUCOSE, CAPILLARY
Glucose-Capillary: 162 mg/dL — ABNORMAL HIGH (ref 70–99)
Glucose-Capillary: 305 mg/dL — ABNORMAL HIGH (ref 70–99)
Glucose-Capillary: 260 mg/dL — ABNORMAL HIGH (ref 70–99)
Glucose-Capillary: 236 mg/dL — ABNORMAL HIGH (ref 70–99)

## 2020-01-08 LAB — CBC
HCT: 42.6 % (ref 39.0–52.0)
Hemoglobin: 13.5 g/dL (ref 13.0–17.0)
MCH: 26.8 pg (ref 26.0–34.0)
MCHC: 31.7 g/dL (ref 30.0–36.0)
MCV: 84.7 fL (ref 80.0–100.0)
Platelets: 264 10*3/uL (ref 150–400)
RBC: 5.03 MIL/uL (ref 4.22–5.81)
RDW: 15.3 % (ref 11.5–15.5)
WBC: 7.5 10*3/uL (ref 4.0–10.5)
nRBC: 0 % (ref 0.0–0.2)

## 2020-01-08 MED ORDER — POTASSIUM CHLORIDE CRYS ER 20 MEQ PO TBCR
20.0000 meq | EXTENDED_RELEASE_TABLET | Freq: Once | ORAL | Status: AC
  Administered 2020-01-08: 20 meq via ORAL
  Filled 2020-01-08: qty 1

## 2020-01-08 MED ORDER — FUROSEMIDE 10 MG/ML IJ SOLN
80.0000 mg | Freq: Once | INTRAMUSCULAR | Status: AC
  Administered 2020-01-08: 80 mg via INTRAVENOUS
  Filled 2020-01-08: qty 8

## 2020-01-08 NOTE — Progress Notes (Signed)
PROGRESS NOTE    Mike Davila  ENM:[id number] DOB: 29-Apr-1946 DOA: 01/02/2020 PCP: [name], MD   Brief Narrative:  Per HPI: Mike Davila a 74 y.o.malewith medical history significant forCKD4, hypertension,prediabetes. Patient presented to the ED with complaints of 1 week of difficulty breathing, and cough. He also reports some mild diarrhea. Patient spouse tested positive for COVID-19 infection March 1,she was sick but did not have any difficulty breathing,she did not require hospitalization.Patient has not been tested. He tells me he has not been vaccinated because he has not been able to get it/have access to it. He denies chest pain.  3/12:Patient was admitted with acute hypoxemic respiratory failure secondary to Covid pneumonia and has been started on remdesivir and dexamethasone along with Rocephin and azithromycin due to suspected superimposed bacterial infection. Continue current treatment plan and wean oxygen as tolerated.  3/13:Patient noted to have worsening hypoxemia and is now requiring up to 10 L high flow nasal cannula. He will be transferred to stepdown unit for further monitoring. Inflammatory markers are downward trending and chest x-ray appears stable. It appears that he may be having noncardiogenic pulmonary edema for which he will be given some IV Lasix and IV fluid will be further held. Continue to monitor renal panel closely. Appreciate nephrology involvement to assist with volume management in the setting of stage IV CKD.  3/14:Patient continues to have persistent hypoxemia requiring high flow nasal cannula. No respiratory distress noted. He has had net negative fluid balance of around 600 mL, but does not appear to have benefited from a respiratory standpoint from this. We will try Actemra today and discontinue IV antibiotics and see if this helps with improvement in his hypoxemia. No sign of PE currently noted and D-dimer is  downtrending.  3/15:Inflammatory markers continue to downtrend and renal function remained stable with nephrology following. Actemra given 3/14 and oxygen requirements are starting to decrease this morning. He continues to feel anxious for which I will schedule Xanax, low-dose twice a day.  3/16: Patient does not appear to be progressing despite Covid treatment and is persistently hypoxemic and even more so now this morning requiring 15 L high flow nasal cannula oxygen.  Pulmonology consult ordered and appreciated.  Chest x-ray with no new findings and ABG with worsening hypoxemic failure noted.  Seen by nephrology and given 1 ampoule of bicarbonate as well as 1 dose of 80 mg IV Lasix this morning.  3/17: Patient is currently on 13 L high flow nasal cannula with no acute overnight events noted.  Seen by pulmonology yesterday with ultrasound of lower extremities ordered with no findings of DVT.  Continue current management.  He appears to have diuresed a little over 2 L from Lasix given by nephrology yesterday as well.  Potassium levels have normalized.  Assessment & Plan:   Principal Problem:   Pneumonia due to COVID-19 virus Active Problems:   Hypertension   CKD (chronic kidney disease), stage III   Prediabetes  Acute hypoxemic respiratory failure secondary to COVID-19 pneumonia-persistent -Actemra administered 3/14 -Repeat chest x-ray stable  on 3/16 -Continue dexamethasone and remdesivir discontinued after total 5-day course -Continue stepdown unit monitoring for now and appreciate ongoing pulmonology and nephrology consultation -Continue supportive measures with breathing treatments and mucolytic's -Appears to have component of anxiety for which Xanax has been orderedas scheduled and as needed  Hypertension-stable -Continue home metoprololand monitor carefully  AKI onCKD stage4-improved -Baseline creatinine 2.2-2.6 -Currently at 2.65,appreciate nephrology, patient has  diuresed well  after Lasix given yesterday -Continue to monitor strict I's and O's as well as repeat labs in a.m., negative fluid balance noted  Prediabetes-controlled -Monitor carefully while on steroids -SSI -A1c 6.1%  BPH -Continue home alfuzosin  Anxiety -Xanax as needed   DVT prophylaxis:Heparin Code Status:Full Family Communication:Discussed with daughter Michelleand wife Vaughan Basta Disposition Plan:Continue close monitoring as patient is in critical condition with high oxygen requirements.  Continue to wean as tolerated.   Consultants:  Nephrology  Pulmonology  Procedures:  See below  Antimicrobials:  Anti-infectives (From admission, onward)   Start     Dose/Rate Route Frequency Ordered Stop   01/03/20 1000  remdesivir 100 mg in sodium chloride 0.9 % 100 mL IVPB     100 mg 200 mL/hr over 30 Minutes Intravenous Daily 01/02/20 1822 01/06/20 0909   01/02/20 2200  remdesivir 100 mg in sodium chloride 0.9 % 100 mL IVPB     100 mg 200 mL/hr over 30 Minutes Intravenous Every 1 hr x 2 01/02/20 2154 01/03/20 0036   01/02/20 2000  remdesivir 200 mg in sodium chloride 0.9% 250 mL IVPB  Status:  Discontinued     200 mg 580 mL/hr over 30 Minutes Intravenous Once 01/02/20 1820 01/02/20 2320   01/02/20 1800  cefTRIAXone (ROCEPHIN) 1 g in sodium chloride 0.9 % 100 mL IVPB  Status:  Discontinued     1 g 200 mL/hr over 30 Minutes Intravenous Every 24 hours 01/02/20 1754 01/05/20 1119   01/02/20 1800  azithromycin (ZITHROMAX) 500 mg in sodium chloride 0.9 % 250 mL IVPB  Status:  Discontinued     500 mg 250 mL/hr over 60 Minutes Intravenous Every 24 hours 01/02/20 1754 01/05/20 1119       Subjective: Patient seen and evaluated today with no new acute complaints or concerns. No acute concerns or events noted overnight.  He continues to remain minimally anxious with high O2 requirements.  Objective: Vitals:   01/08/20 0420 01/08/20 0500 01/08/20 0558 01/08/20 0824   BP: 126/89     Pulse: 71   82  Resp: (!) 21   (!) 25  Temp:   (!) 97.5 F (36.4 C) (!) 97.3 F (36.3 C)  TempSrc:   Oral Oral  SpO2: 94%   93%  Weight:  88.9 kg 88.9 kg   Height:        Intake/Output Summary (Last 24 hours) at 01/08/2020 0848 Last data filed at 01/08/2020 0500 Gross per 24 hour  Intake 840 ml  Output 3050 ml  Net -2210 ml   Filed Weights   01/07/20 0500 01/08/20 0500 01/08/20 0558  Weight: 88.5 kg 88.9 kg 88.9 kg    Examination:  General exam: Appears calm and comfortable  Respiratory system: Clear to auscultation. Respiratory effort normal.  Currently on 13 L high flow nasal cannula oxygen. Cardiovascular system: S1 & S2 heard, RRR. No JVD, murmurs, rubs, gallops or clicks. No pedal edema. Gastrointestinal system: Abdomen is nondistended, soft and nontender. No organomegaly or masses felt. Normal bowel sounds heard. Central nervous system: Alert and oriented. No focal neurological deficits. Extremities: Symmetric 5 x 5 power. Skin: No rashes, lesions or ulcers Psychiatry: Minimally anxious.    Data Reviewed: I have personally reviewed following labs and imaging studies  CBC: Recent Labs  Lab 01/03/20 0558 01/03/20 0558 01/04/20 0740 01/05/20 0351 01/06/20 0425 01/07/20 0429 01/08/20 0428  WBC 2.8*   < > 6.6 5.8 5.3 5.5 7.5  NEUTROABS 2.3  --  5.5 4.8 4.3  4.7  --   HGB 12.4*   < > 12.6* 12.1* 12.3* 12.4* 13.5  HCT 38.8*   < > 39.1 38.1* 39.1 40.1 42.6  MCV 85.5   < > 84.8 85.2 86.1 86.4 84.7  PLT 173   < > 222 218 225 213 264   < > = values in this interval not displayed.   Basic Metabolic Panel: Recent Labs  Lab 01/03/20 0558 01/03/20 0558 01/04/20 0740 01/04/20 0740 01/05/20 0351 01/06/20 0425 01/07/20 0429 01/07/20 1309 01/08/20 0428  NA 135   < > 136  --  139 140 138  --  140  K 4.0   < > 3.9   < > 3.9 4.3 5.9* 3.5 3.5  CL 105   < > 105  --  106 106 107  --  102  CO2 18*   < > 18*  --  22 25 20*  --  24  GLUCOSE 200*   < >  148*  --  160* 161* 175*  --  159*  BUN 35*   < > 58*  --  70* 75* 76*  --  77*  CREATININE 2.81*   < > 2.99*  --  3.05* 2.77* 2.57*  --  2.65*  CALCIUM 8.9   < > 9.3  --  9.1 9.2 9.0  --  9.2  MG 1.9  --  2.0  --  2.2 2.4 2.8*  --   --   PHOS 3.6   < > 3.5  --  4.6 4.3 4.1  --  3.6   < > = values in this interval not displayed.   GFR: Estimated Creatinine Clearance: 24.8 mL/min (A) (by C-G formula based on SCr of 2.65 mg/dL (H)). Liver Function Tests: Recent Labs  Lab 01/03/20 0558 01/03/20 0558 01/04/20 0740 01/05/20 0351 01/06/20 0425 01/07/20 0429 01/08/20 0428  AST 34  --  34 29 26 54*  --   ALT 38  --  37 34 33 36  --   ALKPHOS 55  --  55 51 53 54  --   BILITOT 0.7  --  0.6 0.4 0.5 1.8*  --   PROT 7.6  --  7.4 6.9 6.6 6.2*  --   ALBUMIN 3.4*   < > 3.6 3.5 3.3* 3.3* 3.6   < > = values in this interval not displayed.   No results for input(s): LIPASE, AMYLASE in the last 168 hours. No results for input(s): AMMONIA in the last 168 hours. Coagulation Profile: No results for input(s): INR, PROTIME in the last 168 hours. Cardiac Enzymes: No results for input(s): CKTOTAL, CKMB, CKMBINDEX, TROPONINI in the last 168 hours. BNP (last 3 results) No results for input(s): PROBNP in the last 8760 hours. HbA1C: No results for input(s): HGBA1C in the last 72 hours. CBG: Recent Labs  Lab 01/07/20 0917 01/07/20 1108 01/07/20 1616 01/07/20 2141 01/08/20 0809  GLUCAP 158* 167* 181* 244* 162*   Lipid Profile: No results for input(s): CHOL, HDL, LDLCALC, TRIG, CHOLHDL, LDLDIRECT in the last 72 hours. Thyroid Function Tests: No results for input(s): TSH, T4TOTAL, FREET4, T3FREE, THYROIDAB in the last 72 hours. Anemia Panel: Recent Labs    01/06/20 0425 01/07/20 0429  FERRITIN 950* 724*   Sepsis Labs: Recent Labs  Lab 01/02/20 1543 01/04/20 0740  PROCALCITON 0.35 0.38  LATICACIDVEN 0.9  --     Recent Results (from the past 240 hour(s))  Blood Culture (routine x 2)  Status: None   Collection Time: 01/02/20  3:43 PM   Specimen: Right Antecubital; Blood  Result Value Ref Range Status   Specimen Description RIGHT ANTECUBITAL  Final   Special Requests   Final    BOTTLES DRAWN AEROBIC AND ANAEROBIC Blood Culture adequate volume   Culture   Final    NO GROWTH 5 DAYS Performed at [facility], [address] [place], [place] [address]    Report Status 01/07/2020 FINAL  Final  Blood Culture (routine x 2)     Status: None   Collection Time: 01/02/20  3:54 PM   Specimen: Left Antecubital; Blood  Result Value Ref Range Status   Specimen Description LEFT ANTECUBITAL  Final   Special Requests   Final    BOTTLES DRAWN AEROBIC AND ANAEROBIC Blood Culture adequate volume   Culture   Final    NO GROWTH 5 DAYS Performed at [facility], [address] [place], [place] [address]    Report Status 01/07/2020 FINAL  Final  SARS CORONAVIRUS 2 (TAT 6-24 HRS) Nasopharyngeal Nasopharyngeal Swab     Status: Abnormal   Collection Time: 01/02/20  5:23 PM   Specimen: Nasopharyngeal Swab  Result Value Ref Range Status   SARS Coronavirus 2 POSITIVE (A) NEGATIVE Final    Comment: RESULT CALLED TO, READ BACK BY AND VERIFIED WITH: R. DAWAY,RN 0210 01/03/2020 T. TYSOR (NOTE) SARS-CoV-2 target nucleic acids are DETECTED. The SARS-CoV-2 RNA is generally detectable in upper and lower respiratory specimens during the acute phase of infection. Positive results are indicative of the presence of SARS-CoV-2 RNA. Clinical correlation with patient history and other diagnostic information is  necessary to determine patient infection status. Positive results do not rule out bacterial infection or co-infection with other viruses.  The expected result is Negative. Fact Sheet for Patients: SugarRoll.be Fact Sheet for Healthcare Providers: https://www.woods-mathews.com/ This test is not yet approved or cleared by the [place] FDA and  has  been authorized for detection and/or diagnosis of SARS-CoV-2 by FDA under an Emergency Use Authorization (EUA). This EUA will remain  in effect (meaning this test can be used) for th e duration of the COVID-19 declaration under Section 564(b)(1) of the Act, 21 U.S.C. section 360bbb-3(b)(1), unless the authorization is terminated or revoked sooner. Performed at [facility], [place] [address] [place], [place] [address]   MRSA PCR Screening     Status: None   Collection Time: 01/04/20  1:50 PM   Specimen: Nasal Mucosa; Nasopharyngeal  Result Value Ref Range Status   MRSA by PCR NEGATIVE NEGATIVE Final    Comment:        The GeneXpert MRSA Assay (FDA approved for NASAL specimens only), is one component of a comprehensive MRSA colonization surveillance program. It is not intended to diagnose MRSA infection nor to guide or monitor treatment for MRSA infections. Performed at [facility], [address] [place], [place] [address]          Radiology Studies: US Venous Img Lower Bilateral (DVT)  Result Date: 01/07/2020 CLINICAL DATA:  Bilateral lower extremity swelling EXAM: BILATERAL LOWER EXTREMITY VENOUS DOPPLER ULTRASOUND TECHNIQUE: Gray-scale sonography with graded compression, as well as color Doppler and duplex ultrasound were performed to evaluate the lower extremity deep venous systems from the level of the common femoral vein and including the common femoral, femoral, profunda femoral, popliteal and calf veins including the posterior tibial, peroneal and gastrocnemius veins when visible. The superficial great saphenous vein was also interrogated. Spectral  Doppler was utilized to evaluate flow at rest and with distal augmentation maneuvers in the common femoral, femoral and popliteal veins. COMPARISON:  None. FINDINGS: RIGHT LOWER EXTREMITY Common Femoral Vein: No evidence of thrombus. Normal compressibility, respiratory phasicity and response to augmentation. Saphenofemoral  Junction: No evidence of thrombus. Normal compressibility and flow on color Doppler imaging. Profunda Femoral Vein: No evidence of thrombus. Normal compressibility and flow on color Doppler imaging. Femoral Vein: No evidence of thrombus. Normal compressibility, respiratory phasicity and response to augmentation. Popliteal Vein: No evidence of thrombus. Normal compressibility, respiratory phasicity and response to augmentation. Calf Veins: No evidence of thrombus. Normal compressibility and flow on color Doppler imaging. Superficial Great Saphenous Vein: No evidence of thrombus. Normal compressibility. Venous Reflux:  None. Other Findings:  None. LEFT LOWER EXTREMITY Common Femoral Vein: No evidence of thrombus. Normal compressibility, respiratory phasicity and response to augmentation. Saphenofemoral Junction: No evidence of thrombus. Normal compressibility and flow on color Doppler imaging. Profunda Femoral Vein: No evidence of thrombus. Normal compressibility and flow on color Doppler imaging. Femoral Vein: No evidence of thrombus. Normal compressibility, respiratory phasicity and response to augmentation. Popliteal Vein: No evidence of thrombus. Normal compressibility, respiratory phasicity and response to augmentation. Calf Veins: No evidence of thrombus. Normal compressibility and flow on color Doppler imaging. Superficial Great Saphenous Vein: No evidence of thrombus. Normal compressibility. Venous Reflux:  None. Other Findings:  None. IMPRESSION: No evidence of deep venous thrombosis in either lower extremity. Electronically Signed   By: Constance Holster M.D.   On: 01/07/2020 19:57   DG Chest Port 1 View  Result Date: 01/07/2020 CLINICAL DATA:  Hypoxemia. Additional history provided: COVID positive 5 days ago. EXAM: PORTABLE CHEST 1 VIEW COMPARISON:  Chest radiograph 01/05/2020 FINDINGS: Heart size within normal limits. Similar appearance of heterogeneous bilateral airspace opacities throughout both  lungs. No evidence of pleural effusion or pneumothorax. No acute bony abnormality. Overlying cardiac monitoring leads. IMPRESSION: No significant interval change in heterogeneous bilateral airspace disease. Findings are consistent with COVID pneumonia given the provided history. Electronically Signed   By: Kellie Simmering DO   On: 01/07/2020 11:36        Scheduled Meds: . albuterol  2 puff Inhalation Q6H  . alfuzosin  10 mg Oral Q breakfast  . ALPRAZolam  0.25 mg Oral BID  . vitamin C  500 mg Oral Daily  . Chlorhexidine Gluconate Cloth  6 each Topical Daily  . dexamethasone (DECADRON) injection  6 mg Intravenous Q24H  . feeding supplement (NEPRO CARB STEADY)  237 mL Oral BID BM  . heparin  7,500 Units Subcutaneous Q8H  . insulin aspart  0-5 Units Subcutaneous QHS  . insulin aspart  0-9 Units Subcutaneous TID WC  . mouth rinse  15 mL Mouth Rinse BID  . metoprolol succinate  12.5 mg Oral Daily  . multivitamin with minerals  1 tablet Oral Daily  . saccharomyces boulardii  250 mg Oral BID  . zinc sulfate  220 mg Oral Daily   Continuous Infusions:   LOS: 6 days    Time spent: 35 minutes    Demetrus Pavao Darleen Crocker, DO [facility]  If 7PM-7AM, please contact night-coverage [email or web address] 01/08/2020, 8:48 AM

## 2020-01-08 NOTE — Progress Notes (Signed)
Nephrology Progress Note:   Patient ID: Mike Davila, male   DOB: [birth year], 74 y.o.   MRN: [id number]  Subjective:   He had 3.1 liters UP over 3/16 with lasix.  Pulm was consulted and I spoke with them on 3/16.  Oxygen has been weaned from 15 to 13 liters per my exam.  Bladder scan was 46.  He states hasn't seen Befakadu in a couple of years.  Previously on a diuretic daily but self-discontinued due to urinating so many times during the day.  Review of systems: He denies overt shortness of breath though states that it's hard for him to tell - states everyone is worried about his breathing  No chest pain No n/v States urinating without difficulty and feels like he's emptying. Denies dizziness or cramping   O:BP 126/89   Pulse 82   Temp (!) 97.3 F (36.3 C) (Oral)   Resp (!) 25   Ht 5\' 3"  (1.6 m)   Wt 88.9 kg   SpO2 93%   BMI 34.72 kg/m   Intake/Output Summary (Last 24 hours) at 01/08/2020 0945 Last data filed at 01/08/2020 0500 Gross per 24 hour  Intake 840 ml  Output 3050 ml  Net -2210 ml   Intake/Output: I/O last 3 completed shifts: In: 1080 [P.O.:1080] Out: 3500 [Urine:3500]  Intake/Output this shift:  No intake/output data recorded. Weight change: 0.4 kg   Physical exam:   General adult male in bed on 13 liters oxygen  HEENT normocephalic atraumatic extraocular movements intact sclera anicteric Neck supple trachea midline Lungs decreased breath sounds; unlabored at rest and with brief speech for me; high supp oxygen requirement as above  Heart S1S2 no rub Abdomen soft nontender nondistended Extremities trace edema bilateral lower extremities  GU no foley Psych normal mood and affect Neuro - alert and oriented x 3 follows commands and provides hx    Recent Labs  Lab 01/02/20 1543 01/02/20 1543 01/03/20 0558 01/04/20 0740 01/05/20 0351 01/06/20 0425 01/07/20 0429 01/07/20 1309 01/08/20 0428  NA 136  --  135 136 139 140 138  --  140  K 3.6   < >  4.0 3.9 3.9 4.3 5.9* 3.5 3.5  CL 105  --  105 105 106 106 107  --  102  CO2 21*  --  18* 18* 22 25 20*  --  24  GLUCOSE 130*  --  200* 148* 160* 161* 175*  --  159*  BUN 27*  --  35* 58* 70* 75* 76*  --  77*  CREATININE 2.65*  --  2.81* 2.99* 3.05* 2.77* 2.57*  --  2.65*  ALBUMIN 3.7  --  3.4* 3.6 3.5 3.3* 3.3*  --  3.6  CALCIUM 9.0  --  8.9 9.3 9.1 9.2 9.0  --  9.2  PHOS  --   --  3.6 3.5 4.6 4.3 4.1  --  3.6  AST 40  --  34 34 29 26 54*  --   --   ALT 41  --  38 37 34 33 36  --   --    < > = values in this interval not displayed.   Liver Function Tests: Recent Labs  Lab 01/05/20 0351 01/05/20 0351 01/06/20 0425 01/07/20 0429 01/08/20 0428  AST 29  --  26 54*  --   ALT 34  --  33 36  --   ALKPHOS 51  --  53 54  --   BILITOT 0.4  --  0.5 1.8*  --   PROT 6.9  --  6.6 6.2*  --   ALBUMIN 3.5   < > 3.3* 3.3* 3.6   < > = values in this interval not displayed.   CBC: Recent Labs  Lab 01/04/20 0740 01/04/20 0740 01/05/20 0351 01/05/20 0351 01/06/20 0425 01/07/20 0429 01/08/20 0428  WBC 6.6   < > 5.8   < > 5.3 5.5 7.5  NEUTROABS 5.5   < > 4.8  --  4.3 4.7  --   HGB 12.6*   < > 12.1*   < > 12.3* 12.4* 13.5  HCT 39.1   < > 38.1*   < > 39.1 40.1 42.6  MCV 84.8  --  85.2  --  86.1 86.4 84.7  PLT 222   < > 218   < > 225 213 264   < > = values in this interval not displayed.   Cardiac Enzymes: No results for input(s): CKTOTAL, CKMB, CKMBINDEX, TROPONINI in the last 168 hours. CBG: Recent Labs  Lab 01/07/20 0917 01/07/20 1108 01/07/20 1616 01/07/20 2141 01/08/20 0809  GLUCAP 158* 167* 181* 244* 162*    Iron Studies:  Recent Labs    01/07/20 0429  FERRITIN 724*   Studies/Results: US Venous Img Lower Bilateral (DVT)  Result Date: 01/07/2020 CLINICAL DATA:  Bilateral lower extremity swelling EXAM: BILATERAL LOWER EXTREMITY VENOUS DOPPLER ULTRASOUND TECHNIQUE: Gray-scale sonography with graded compression, as well as color Doppler and duplex ultrasound were performed  to evaluate the lower extremity deep venous systems from the level of the common femoral vein and including the common femoral, femoral, profunda femoral, popliteal and calf veins including the posterior tibial, peroneal and gastrocnemius veins when visible. The superficial great saphenous vein was also interrogated. Spectral Doppler was utilized to evaluate flow at rest and with distal augmentation maneuvers in the common femoral, femoral and popliteal veins. COMPARISON:  None. FINDINGS: RIGHT LOWER EXTREMITY Common Femoral Vein: No evidence of thrombus. Normal compressibility, respiratory phasicity and response to augmentation. Saphenofemoral Junction: No evidence of thrombus. Normal compressibility and flow on color Doppler imaging. Profunda Femoral Vein: No evidence of thrombus. Normal compressibility and flow on color Doppler imaging. Femoral Vein: No evidence of thrombus. Normal compressibility, respiratory phasicity and response to augmentation. Popliteal Vein: No evidence of thrombus. Normal compressibility, respiratory phasicity and response to augmentation. Calf Veins: No evidence of thrombus. Normal compressibility and flow on color Doppler imaging. Superficial Great Saphenous Vein: No evidence of thrombus. Normal compressibility. Venous Reflux:  None. Other Findings:  None. LEFT LOWER EXTREMITY Common Femoral Vein: No evidence of thrombus. Normal compressibility, respiratory phasicity and response to augmentation. Saphenofemoral Junction: No evidence of thrombus. Normal compressibility and flow on color Doppler imaging. Profunda Femoral Vein: No evidence of thrombus. Normal compressibility and flow on color Doppler imaging. Femoral Vein: No evidence of thrombus. Normal compressibility, respiratory phasicity and response to augmentation. Popliteal Vein: No evidence of thrombus. Normal compressibility, respiratory phasicity and response to augmentation. Calf Veins: No evidence of thrombus. Normal  compressibility and flow on color Doppler imaging. Superficial Great Saphenous Vein: No evidence of thrombus. Normal compressibility. Venous Reflux:  None. Other Findings:  None. IMPRESSION: No evidence of deep venous thrombosis in either lower extremity. Electronically Signed   By: Constance Holster M.D.   On: 01/07/2020 19:57   DG Chest Port 1 View  Result Date: 01/07/2020 CLINICAL DATA:  Hypoxemia. Additional history provided: COVID positive 5 days ago. EXAM: PORTABLE CHEST 1 VIEW COMPARISON:  Chest  radiograph 01/05/2020 FINDINGS: Heart size within normal limits. Similar appearance of heterogeneous bilateral airspace opacities throughout both lungs. No evidence of pleural effusion or pneumothorax. No acute bony abnormality. Overlying cardiac monitoring leads. IMPRESSION: No significant interval change in heterogeneous bilateral airspace disease. Findings are consistent with COVID pneumonia given the provided history. Electronically Signed   By: Kellie Simmering DO   On: 01/07/2020 11:36   . albuterol  2 puff Inhalation Q6H  . alfuzosin  10 mg Oral Q breakfast  . ALPRAZolam  0.25 mg Oral BID  . vitamin C  500 mg Oral Daily  . Chlorhexidine Gluconate Cloth  6 each Topical Daily  . dexamethasone (DECADRON) injection  6 mg Intravenous Q24H  . feeding supplement (NEPRO CARB STEADY)  237 mL Oral BID BM  . heparin  7,500 Units Subcutaneous Q8H  . insulin aspart  0-5 Units Subcutaneous QHS  . insulin aspart  0-9 Units Subcutaneous TID WC  . mouth rinse  15 mL Mouth Rinse BID  . metoprolol succinate  12.5 mg Oral Daily  . multivitamin with minerals  1 tablet Oral Daily  . saccharomyces boulardii  250 mg Oral BID  . zinc sulfate  220 mg Oral Daily    BMET    Component Value Date/Time   NA 140 01/08/2020 0428   K 3.5 01/08/2020 0428   CL 102 01/08/2020 0428   CO2 24 01/08/2020 0428   GLUCOSE 159 (H) 01/08/2020 0428   BUN 77 (H) 01/08/2020 0428   CREATININE 2.65 (H) 01/08/2020 0428   CREATININE  2.26 (H) 10/30/2018 0940   CALCIUM 9.2 01/08/2020 0428   GFRNONAA 23 (L) 01/08/2020 0428   GFRNONAA 28 (L) 10/30/2018 0940   GFRAA 27 (L) 01/08/2020 0428   GFRAA 33 (L) 10/30/2018 0940   CBC    Component Value Date/Time   WBC 7.5 01/08/2020 0428   RBC 5.03 01/08/2020 0428   HGB 13.5 01/08/2020 0428   HCT 42.6 01/08/2020 0428   PLT 264 01/08/2020 0428   MCV 84.7 01/08/2020 0428   MCH 26.8 01/08/2020 0428   MCHC 31.7 01/08/2020 0428   RDW 15.3 01/08/2020 0428   LYMPHSABS 0.5 (L) 01/07/2020 0429   MONOABS 0.3 01/07/2020 0429   EOSABS 0.0 01/07/2020 0429   BASOSABS 0.0 01/07/2020 0429     Assessment/Plan:  1. AKI/CKD stage 4- in setting of COVID-19 pneumonia and IV diuresis.  Renal US without obstruction.  UA with chronic microscopic hematuria per report though note no RBC on UA.  K of 5.9 on 3/16 may have been an error - Cr stable; BUN up presumably in part due to steroids.   - Continue to follow for now.  No indication for dialysis.   - lasix IV once now    2. CKD stage 4 - note CKD is reported which pre-dates admission.  10/2018 with Cr 2.26 and 10/2017 with Cr 2.68.  Previously saw Befakadu - hasn't seen recently. 3. Acute hypoxic respiratory failure.  Lasix 80 mg IV once now to optimize but do not feel fluid is a significant component so would not schedule lasix.  on supplemental oxygen. With covid. Optimize volume as able  4. COVID-19 PNA with persistent hypoxia- therapies per primary team. S/p actemra and remdesivir and on dexamethasone 5. Hyperkalemia - resolved and may not have been accurate per trends.  Follow K. 6. HTN- stable   7. Hyperglycemia- monitor per primary  Disposition per primary team and pulm   Claudia Desanctis, MD  [facility] 01/08/2020 10:12 AM

## 2020-01-08 NOTE — Progress Notes (Signed)
Name: Mike Davila MRN: [id number] DOB: August 05, 1946    ADMISSION DATE:  01/02/2020 CONSULTATION DATE:  01/08/2020  REFERRING MD :  Hassell Halim   CHIEF COMPLAINT: Respiratory distress, hypoxia  BRIEF PATIENT DESCRIPTION: 74 year old man with hypertension and CKD who was admitted for Covid pneumonia and hypoxic respiratory failure .PCCM consulted for  persistent hypoxia  SIGNIFICANT EVENTS   3/13 >> more hypoxic requiring 10 L high flow nasal cannula 3/14  given Actemra on 3/14, peak CRP was 15  STUDIES:  Echo 10/2017 normal LV function 3/16 ven duplex BLE >> neg   SUBJECTIVE:   VITAL SIGNS: Temp:  [97.3 F (36.3 C)-98.7 F (37.1 C)] 97.8 F (36.6 C) (03/17 1142) Pulse Rate:  [62-101] 80 (03/17 1300) Resp:  [15-25] 19 (03/17 1300) BP: (102-137)/(60-95) 116/87 (03/17 1300) SpO2:  [86 %-96 %] 96 % (03/17 1300) FiO2 (%):  [91 %] 91 % (03/17 0136) Weight:  [88.9 kg] 88.9 kg (03/17 0558)  PHYSICAL EXAMINATION:  Not examined today to conserve PPE No accessory muscle use   Recent Labs  Lab 01/06/20 0425 01/06/20 0425 01/07/20 0429 01/07/20 1309 01/08/20 0428  NA 140  --  138  --  140  K 4.3   < > 5.9* 3.5 3.5  CL 106  --  107  --  102  CO2 25  --  20*  --  24  BUN 75*  --  76*  --  77*  CREATININE 2.77*  --  2.57*  --  2.65*  GLUCOSE 161*  --  175*  --  159*   < > = values in this interval not displayed.   Recent Labs  Lab 01/06/20 0425 01/07/20 0429 01/08/20 0428  HGB 12.3* 12.4* 13.5  HCT 39.1 40.1 42.6  WBC 5.3 5.5 7.5  PLT 225 213 264   US Venous Img Lower Bilateral (DVT)  Result Date: 01/07/2020 CLINICAL DATA:  Bilateral lower extremity swelling EXAM: BILATERAL LOWER EXTREMITY VENOUS DOPPLER ULTRASOUND TECHNIQUE: Gray-scale sonography with graded compression, as well as color Doppler and duplex ultrasound were performed to evaluate the lower extremity deep venous systems from the level of the common femoral vein and including the common femoral,  femoral, profunda femoral, popliteal and calf veins including the posterior tibial, peroneal and gastrocnemius veins when visible. The superficial great saphenous vein was also interrogated. Spectral Doppler was utilized to evaluate flow at rest and with distal augmentation maneuvers in the common femoral, femoral and popliteal veins. COMPARISON:  None. FINDINGS: RIGHT LOWER EXTREMITY Common Femoral Vein: No evidence of thrombus. Normal compressibility, respiratory phasicity and response to augmentation. Saphenofemoral Junction: No evidence of thrombus. Normal compressibility and flow on color Doppler imaging. Profunda Femoral Vein: No evidence of thrombus. Normal compressibility and flow on color Doppler imaging. Femoral Vein: No evidence of thrombus. Normal compressibility, respiratory phasicity and response to augmentation. Popliteal Vein: No evidence of thrombus. Normal compressibility, respiratory phasicity and response to augmentation. Calf Veins: No evidence of thrombus. Normal compressibility and flow on color Doppler imaging. Superficial Great Saphenous Vein: No evidence of thrombus. Normal compressibility. Venous Reflux:  None. Other Findings:  None. LEFT LOWER EXTREMITY Common Femoral Vein: No evidence of thrombus. Normal compressibility, respiratory phasicity and response to augmentation. Saphenofemoral Junction: No evidence of thrombus. Normal compressibility and flow on color Doppler imaging. Profunda Femoral Vein: No evidence of thrombus. Normal compressibility and flow on color Doppler imaging. Femoral Vein: No evidence of thrombus. Normal compressibility, respiratory phasicity and response to augmentation. Popliteal Vein:  No evidence of thrombus. Normal compressibility, respiratory phasicity and response to augmentation. Calf Veins: No evidence of thrombus. Normal compressibility and flow on color Doppler imaging. Superficial Great Saphenous Vein: No evidence of thrombus. Normal compressibility.  Venous Reflux:  None. Other Findings:  None. IMPRESSION: No evidence of deep venous thrombosis in either lower extremity. Electronically Signed   By: Constance Holster M.D.   On: 01/07/2020 19:57   DG Chest Port 1 View  Result Date: 01/07/2020 CLINICAL DATA:  Hypoxemia. Additional history provided: COVID positive 5 days ago. EXAM: PORTABLE CHEST 1 VIEW COMPARISON:  Chest radiograph 01/05/2020 FINDINGS: Heart size within normal limits. Similar appearance of heterogeneous bilateral airspace opacities throughout both lungs. No evidence of pleural effusion or pneumothorax. No acute bony abnormality. Overlying cardiac monitoring leads. IMPRESSION: No significant interval change in heterogeneous bilateral airspace disease. Findings are consistent with COVID pneumonia given the provided history. Electronically Signed   By: Kellie Simmering DO   On: 01/07/2020 11:36    ASSESSMENT / PLAN:  Acute hypoxic respiratory failure due to Covid pneumonia  -Diuresed well with Lasix but  remains hypoxic. Currently on 12 L hi flow nasal cannula  -Has received Actemra and remdesivir -Comorbidities include diabetes, hypertension and CKD  Recommend -Continue dexamethasone -Continue high flow nasal cannula, oxygen saturation 88 to 92% acceptable , if worse may need heated high flow -Incentive spirometry -venous duplex neg , doubt we need to pursue PE here  Uncontrolled hyperglycemia - consider adding levemir while on dexa CKD - ct lasix 80 per Renal  I remain optimistic about his eventual recovery.    Kara Mead MD. [facility]. [facility]  If no response to pager , please call [phone]     01/08/2020, 2:35 PM

## 2020-01-09 LAB — MAGNESIUM: Magnesium: 2.5 mg/dL — ABNORMAL HIGH (ref 1.7–2.4)

## 2020-01-09 LAB — BASIC METABOLIC PANEL
Anion gap: 13 (ref 5–15)
BUN: 79 mg/dL — ABNORMAL HIGH (ref 8–23)
CO2: 26 mmol/L (ref 22–32)
Calcium: 9.6 mg/dL (ref 8.9–10.3)
Chloride: 101 mmol/L (ref 98–111)
Creatinine, Ser: 2.45 mg/dL — ABNORMAL HIGH (ref 0.61–1.24)
GFR calc Af Amer: 29 mL/min — ABNORMAL LOW (ref >60)
GFR calc non Af Amer: 25 mL/min — ABNORMAL LOW (ref >60)
Glucose, Bld: 178 mg/dL — ABNORMAL HIGH (ref 70–99)
Potassium: 3.6 mmol/L (ref 3.5–5.1)
Sodium: 140 mmol/L (ref 135–145)

## 2020-01-09 LAB — CBC
HCT: 44.9 % (ref 39.0–52.0)
Hemoglobin: 14.3 g/dL (ref 13.0–17.0)
MCH: 27.1 pg (ref 26.0–34.0)
MCHC: 31.8 g/dL (ref 30.0–36.0)
MCV: 85 fL (ref 80.0–100.0)
Platelets: 250 10*3/uL (ref 150–400)
RBC: 5.28 MIL/uL (ref 4.22–5.81)
RDW: 15 % (ref 11.5–15.5)
WBC: 7.9 10*3/uL (ref 4.0–10.5)
nRBC: 0 % (ref 0.0–0.2)

## 2020-01-09 LAB — GLUCOSE, CAPILLARY
Glucose-Capillary: 169 mg/dL — ABNORMAL HIGH (ref 70–99)
Glucose-Capillary: 332 mg/dL — ABNORMAL HIGH (ref 70–99)
Glucose-Capillary: 201 mg/dL — ABNORMAL HIGH (ref 70–99)
Glucose-Capillary: 238 mg/dL — ABNORMAL HIGH (ref 70–99)

## 2020-01-09 MED ORDER — FUROSEMIDE 40 MG PO TABS
40.0000 mg | ORAL_TABLET | Freq: Two times a day (BID) | ORAL | Status: DC
  Administered 2020-01-09 – 2020-01-14 (×10): 40 mg via ORAL
  Filled 2020-01-09 (×10): qty 1

## 2020-01-09 MED ORDER — INSULIN DETEMIR 100 UNIT/ML ~~LOC~~ SOLN
5.0000 [IU] | Freq: Every day | SUBCUTANEOUS | Status: DC
  Administered 2020-01-09 – 2020-01-14 (×6): 5 [IU] via SUBCUTANEOUS
  Filled 2020-01-09 (×7): qty 0.05

## 2020-01-09 NOTE — Progress Notes (Addendum)
Patient ID: Mike Davila, male   DOB: 04-09-1946, 74 y.o.   MRN: [id number] S: Still requiring 13 liters high flow [place].  Had large diuresis following IV lasix 80 mg 2 days ago.  None given yesterday O:BP 126/88   Pulse 81   Temp 97.9 F (36.6 C) (Axillary)   Resp 13   Ht 5\' 3"  (1.6 m)   Wt 89.4 kg   SpO2 95%   BMI 34.91 kg/m   Intake/Output Summary (Last 24 hours) at 01/09/2020 1233 Last data filed at 01/09/2020 0500 Gross per 24 hour  Intake 700 ml  Output 700 ml  Net 0 ml   Intake/Output: I/O last 3 completed shifts: In: 1790 [P.O.:1310; NG/GT:480] Out: 1400 [Urine:1400]  Intake/Output this shift:  No intake/output data recorded. Weight change: 0.5 kg Gen: sitting on edge of his bed in NAD Physical exam: unable to complete due to COVID + status.  In order to preserve PPE equipment and to minimize exposure to providers.  Notes from other caregivers reviewed   Recent Labs  Lab 01/02/20 1543 01/02/20 1543 01/03/20 3545 01/03/20 6256 01/04/20 0740 01/05/20 0351 01/06/20 0425 01/07/20 0429 01/07/20 1309 01/08/20 0428 01/09/20 0510  NA 136   < > 135  --  136 139 140 138  --  140 140  K 3.6   < > 4.0   < > 3.9 3.9 4.3 5.9* 3.5 3.5 3.6  CL 105   < > 105  --  105 106 106 107  --  102 101  CO2 21*   < > 18*  --  18* 22 25 20*  --  24 26  GLUCOSE 130*   < > 200*  --  148* 160* 161* 175*  --  159* 178*  BUN 27*   < > 35*  --  58* 70* 75* 76*  --  77* 79*  CREATININE 2.65*   < > 2.81*  --  2.99* 3.05* 2.77* 2.57*  --  2.65* 2.45*  ALBUMIN 3.7  --  3.4*  --  3.6 3.5 3.3* 3.3*  --  3.6  --   CALCIUM 9.0   < > 8.9  --  9.3 9.1 9.2 9.0  --  9.2 9.6  PHOS  --   --  3.6  --  3.5 4.6 4.3 4.1  --  3.6  --   AST 40  --  34  --  34 29 26 54*  --   --   --   ALT 41  --  38  --  37 34 33 36  --   --   --    < > = values in this interval not displayed.   Liver Function Tests: Recent Labs  Lab 01/05/20 0351 01/05/20 0351 01/06/20 0425 01/07/20 0429 01/08/20 0428  AST 29  --   26 54*  --   ALT 34  --  33 36  --   ALKPHOS 51  --  53 54  --   BILITOT 0.4  --  0.5 1.8*  --   PROT 6.9  --  6.6 6.2*  --   ALBUMIN 3.5   < > 3.3* 3.3* 3.6   < > = values in this interval not displayed.   No results for input(s): LIPASE, AMYLASE in the last 168 hours. No results for input(s): AMMONIA in the last 168 hours. CBC: Recent Labs  Lab 01/05/20 0351 01/05/20 0351 01/06/20 0425 01/06/20 0425 01/07/20 0429 01/08/20  2409 01/09/20 0510  WBC 5.8   < > 5.3   < > 5.5 7.5 7.9  NEUTROABS 4.8  --  4.3  --  4.7  --   --   HGB 12.1*   < > 12.3*   < > 12.4* 13.5 14.3  HCT 38.1*   < > 39.1   < > 40.1 42.6 44.9  MCV 85.2  --  86.1  --  86.4 84.7 85.0  PLT 218   < > 225   < > 213 264 250   < > = values in this interval not displayed.   Cardiac Enzymes: No results for input(s): CKTOTAL, CKMB, CKMBINDEX, TROPONINI in the last 168 hours. CBG: Recent Labs  Lab 01/08/20 1140 01/08/20 1652 01/08/20 2113 01/09/20 0757 01/09/20 1116  GLUCAP 305* 260* 236* 169* 332*    Iron Studies:  Recent Labs    01/07/20 0429  FERRITIN 724*   Studies/Results: US Venous Img Lower Bilateral (DVT)  Result Date: 01/07/2020 CLINICAL DATA:  Bilateral lower extremity swelling EXAM: BILATERAL LOWER EXTREMITY VENOUS DOPPLER ULTRASOUND TECHNIQUE: Gray-scale sonography with graded compression, as well as color Doppler and duplex ultrasound were performed to evaluate the lower extremity deep venous systems from the level of the common femoral vein and including the common femoral, femoral, profunda femoral, popliteal and calf veins including the posterior tibial, peroneal and gastrocnemius veins when visible. The superficial great saphenous vein was also interrogated. Spectral Doppler was utilized to evaluate flow at rest and with distal augmentation maneuvers in the common femoral, femoral and popliteal veins. COMPARISON:  None. FINDINGS: RIGHT LOWER EXTREMITY Common Femoral Vein: No evidence of thrombus.  Normal compressibility, respiratory phasicity and response to augmentation. Saphenofemoral Junction: No evidence of thrombus. Normal compressibility and flow on color Doppler imaging. Profunda Femoral Vein: No evidence of thrombus. Normal compressibility and flow on color Doppler imaging. Femoral Vein: No evidence of thrombus. Normal compressibility, respiratory phasicity and response to augmentation. Popliteal Vein: No evidence of thrombus. Normal compressibility, respiratory phasicity and response to augmentation. Calf Veins: No evidence of thrombus. Normal compressibility and flow on color Doppler imaging. Superficial Great Saphenous Vein: No evidence of thrombus. Normal compressibility. Venous Reflux:  None. Other Findings:  None. LEFT LOWER EXTREMITY Common Femoral Vein: No evidence of thrombus. Normal compressibility, respiratory phasicity and response to augmentation. Saphenofemoral Junction: No evidence of thrombus. Normal compressibility and flow on color Doppler imaging. Profunda Femoral Vein: No evidence of thrombus. Normal compressibility and flow on color Doppler imaging. Femoral Vein: No evidence of thrombus. Normal compressibility, respiratory phasicity and response to augmentation. Popliteal Vein: No evidence of thrombus. Normal compressibility, respiratory phasicity and response to augmentation. Calf Veins: No evidence of thrombus. Normal compressibility and flow on color Doppler imaging. Superficial Great Saphenous Vein: No evidence of thrombus. Normal compressibility. Venous Reflux:  None. Other Findings:  None. IMPRESSION: No evidence of deep venous thrombosis in either lower extremity. Electronically Signed   By: Constance Holster M.D.   On: 01/07/2020 19:57   . albuterol  2 puff Inhalation Q6H  . alfuzosin  10 mg Oral Q breakfast  . ALPRAZolam  0.25 mg Oral BID  . vitamin C  500 mg Oral Daily  . Chlorhexidine Gluconate Cloth  6 each Topical Daily  . dexamethasone (DECADRON) injection  6  mg Intravenous Q24H  . feeding supplement (NEPRO CARB STEADY)  237 mL Oral BID BM  . heparin  7,500 Units Subcutaneous Q8H  . insulin aspart  0-5 Units Subcutaneous QHS  .  insulin aspart  0-9 Units Subcutaneous TID WC  . insulin detemir  5 Units Subcutaneous Daily  . mouth rinse  15 mL Mouth Rinse BID  . metoprolol succinate  12.5 mg Oral Daily  . multivitamin with minerals  1 tablet Oral Daily  . saccharomyces boulardii  250 mg Oral BID  . zinc sulfate  220 mg Oral Daily    BMET    Component Value Date/Time   NA 140 01/09/2020 0510   K 3.6 01/09/2020 0510   CL 101 01/09/2020 0510   CO2 26 01/09/2020 0510   GLUCOSE 178 (H) 01/09/2020 0510   BUN 79 (H) 01/09/2020 0510   CREATININE 2.45 (H) 01/09/2020 0510   CREATININE 2.26 (H) 10/30/2018 0940   CALCIUM 9.6 01/09/2020 0510   GFRNONAA 25 (L) 01/09/2020 0510   GFRNONAA 28 (L) 10/30/2018 0940   GFRAA 29 (L) 01/09/2020 0510   GFRAA 33 (L) 10/30/2018 0940   CBC    Component Value Date/Time   WBC 7.9 01/09/2020 0510   RBC 5.28 01/09/2020 0510   HGB 14.3 01/09/2020 0510   HCT 44.9 01/09/2020 0510   PLT 250 01/09/2020 0510   MCV 85.0 01/09/2020 0510   MCH 27.1 01/09/2020 0510   MCHC 31.8 01/09/2020 0510   RDW 15.0 01/09/2020 0510   LYMPHSABS 0.5 (L) 01/07/2020 0429   MONOABS 0.3 01/07/2020 0429   EOSABS 0.0 01/07/2020 0429   BASOSABS 0.0 01/07/2020 0429    Assessment/Plan:  1. AKI/CKD stage 4- in setting of COVID-19 pneumonia and IV diuresis.  Now slowly improving off of IV lasix, although BUN still high presumably due to steroids.  Renal US without obstruction.  UA with chronic microscopic hematuria.  Scr now at baseline.   1. He was on lasix 40 mg bid as an outpatient and would resume his outpatient dose and and follow UOP and SCr. 2. No indication for dialysis. 3. Nothing further to add and will sign off for now.  Please call with questions or concerns 2. COVID-19 PNA with persistent hypoxia- off of rocephin and  azithromycin, on dexamethasone and remdesivir.  Actemra started per Dr. Manuella Ghazi and PCCM is following. 3. HTN- stable 4. Hyperglycemia- monitor per primary  Donetta Potts, MD [facility] [phone]

## 2020-01-09 NOTE — Progress Notes (Signed)
PROGRESS NOTE    Mike Davila  GEZ:[id number] DOB: 02-Feb-1946 DOA: 01/02/2020 PCP: [name], MD   Brief Narrative:  Per HPI: Mike Davila a 74 y.o.malewith medical history significant forCKD4, hypertension,prediabetes. Patient presented to the ED with complaints of 1 week of difficulty breathing, and cough. He also reports some mild diarrhea. Patient spouse tested positive for COVID-19 infection March 1,she was sick but did not have any difficulty breathing,she did not require hospitalization.Patient has not been tested. He tells me he has not been vaccinated because he has not been able to get it/have access to it. He denies chest pain.  3/12:Patient was admitted with acute hypoxemic respiratory failure secondary to Covid pneumonia and has been started on remdesivir and dexamethasone along with Rocephin and azithromycin due to suspected superimposed bacterial infection. Continue current treatment plan and wean oxygen as tolerated.  3/13:Patient noted to have worsening hypoxemia and is now requiring up to 10 L high flow nasal cannula. He will be transferred to stepdown unit for further monitoring. Inflammatory markers are downward trending and chest x-ray appears stable. It appears that he may be having noncardiogenic pulmonary edema for which he will be given some IV Lasix and IV fluid will be further held. Continue to monitor renal panel closely. Appreciate nephrology involvement to assist with volume management in the setting of stage IV CKD.  3/14:Patient continues to have persistent hypoxemia requiring high flow nasal cannula. No respiratory distress noted. He has had net negative fluid balance of around 600 mL, but does not appear to have benefited from a respiratory standpoint from this. We will try Actemra today and discontinue IV antibiotics and see if this helps with improvement in his hypoxemia. No sign of PE currently noted and D-dimer is  downtrending.  3/15:Inflammatory markers continue to downtrend and renal function remained stable with nephrology following. Actemra given 3/14 and oxygen requirements are starting to decrease this morning. He continues to feel anxious for which I will schedule Xanax, low-dose twice a day.  3/16:Patient does not appear to be progressing despite Covid treatment and is persistently hypoxemic and even more so now this morning requiring 15 L high flow nasal cannula oxygen. Pulmonology consult ordered and appreciated. Chest x-ray with no new findings and ABG with worsening hypoxemic failure noted. Seen by nephrology and given 1 ampoule of bicarbonate as well as 1 dose of 80 mg IV Lasix this morning.  3/17: Patient is currently on 13 L high flow nasal cannula with no acute overnight events noted.  Seen by pulmonology yesterday with ultrasound of lower extremities ordered with no findings of DVT.  Continue current management.  He appears to have diuresed a little over 2 L from Lasix given by nephrology yesterday as well.  Potassium levels have normalized.  3/18: Patient continues to require 13 L high flow nasal cannula oxygen supplementation.  He will need to continue to be weaned slowly.  Continue on steroids as prescribed for now with remdesivir treatment completed.  Appreciate further nephrology recommendations for diuresis as appropriate.  Assessment & Plan:   Principal Problem:   Pneumonia due to COVID-19 virus Active Problems:   Hypertension   CKD (chronic kidney disease), stage III   Prediabetes   Acute respiratory failure with hypoxia (HCC)   Acute hypoxemic respiratory failure secondary to COVID-19 pneumonia-persistent -Actemra administered 3/14 -Repeat chest x-ray stable on 3/16 -Continue dexamethasone for now and remdesivirdiscontinued after total 5-day course -Continue stepdown unit monitoringfor now and appreciate ongoing pulmonology and  nephrology consultation -Continue  supportive measures with breathing treatments and mucolytic's -Appears to have component of anxiety for which Xanax has been orderedas scheduled and as needed  Hypertension-stable -Continue home metoprololand monitor carefully  AKI onCKD stage4-improved -Baseline creatinine 2.2-2.6 -Currentlyat 2.45,appreciate nephrology, patient has diuresed well after Lasix given yesterday -Continue to monitor strict I's and O's as well as repeat labs in a.m., negative fluid balance noted  Prediabetes-with steroid-induced hyperglycemia -Monitor carefully while on steroids -SSI -A1c 6.1% -Added Levemir 5 units daily today to help improve blood glucose while on steroids  BPH -Continue home alfuzosin  Anxiety -Xanax as needed   DVT prophylaxis:Heparin Code Status:Full Family Communication:Discussed with daughter Michelleand wife Vaughan Basta Disposition Plan:Continue close monitoring as patient is in critical condition with high oxygen requirements.  Continue to wean as tolerated.   Consultants:  Nephrology  Pulmonology  Procedures:  See below  Antimicrobials:  Anti-infectives (From admission, onward)   Start     Dose/Rate Route Frequency Ordered Stop   01/03/20 1000  remdesivir 100 mg in sodium chloride 0.9 % 100 mL IVPB     100 mg 200 mL/hr over 30 Minutes Intravenous Daily 01/02/20 1822 01/06/20 0909   01/02/20 2200  remdesivir 100 mg in sodium chloride 0.9 % 100 mL IVPB     100 mg 200 mL/hr over 30 Minutes Intravenous Every 1 hr x 2 01/02/20 2154 01/03/20 0036   01/02/20 2000  remdesivir 200 mg in sodium chloride 0.9% 250 mL IVPB  Status:  Discontinued     200 mg 580 mL/hr over 30 Minutes Intravenous Once 01/02/20 1820 01/02/20 2320   01/02/20 1800  cefTRIAXone (ROCEPHIN) 1 g in sodium chloride 0.9 % 100 mL IVPB  Status:  Discontinued     1 g 200 mL/hr over 30 Minutes Intravenous Every 24 hours 01/02/20 1754 01/05/20 1119   01/02/20 1800  azithromycin  (ZITHROMAX) 500 mg in sodium chloride 0.9 % 250 mL IVPB  Status:  Discontinued     500 mg 250 mL/hr over 60 Minutes Intravenous Every 24 hours 01/02/20 1754 01/05/20 1119       Subjective: Patient seen and evaluated today with no new acute complaints or concerns. No acute concerns or events noted overnight.  He continues to have high oxygen requirements.  Objective: Vitals:   01/09/20 0700 01/09/20 0758 01/09/20 0800 01/09/20 0902  BP: (!) 118/96  (!) 136/99 126/88  Pulse: 71 88 80 84  Resp: 16 (!) 21 17 17   Temp:  (!) 97.5 F (36.4 C)    TempSrc:  Oral    SpO2: 90% 94% 92% (!) 88%  Weight:      Height:        Intake/Output Summary (Last 24 hours) at 01/09/2020 1017 Last data filed at 01/09/2020 0500 Gross per 24 hour  Intake 800 ml  Output 700 ml  Net 100 ml   Filed Weights   01/08/20 0500 01/08/20 0558 01/09/20 0500  Weight: 88.9 kg 88.9 kg 89.4 kg    Examination:  General exam: Appears calm and comfortable  Respiratory system: Clear to auscultation. Respiratory effort normal.  Currently on 13 L high flow nasal cannula oxygen. Cardiovascular system: S1 & S2 heard, RRR. No JVD, murmurs, rubs, gallops or clicks. No pedal edema. Gastrointestinal system: Abdomen is nondistended, soft and nontender. No organomegaly or masses felt. Normal bowel sounds heard. Central nervous system: Alert and oriented. No focal neurological deficits. Extremities: Symmetric 5 x 5 power. Skin: No rashes, lesions or ulcers Psychiatry:  Flat affect.    Data Reviewed: I have personally reviewed following labs and imaging studies  CBC: Recent Labs  Lab 01/03/20 0558 01/03/20 0558 01/04/20 0740 01/04/20 0740 01/05/20 0351 01/06/20 0425 01/07/20 0429 01/08/20 0428 01/09/20 0510  WBC 2.8*   < > 6.6   < > 5.8 5.3 5.5 7.5 7.9  NEUTROABS 2.3  --  5.5  --  4.8 4.3 4.7  --   --   HGB 12.4*   < > 12.6*   < > 12.1* 12.3* 12.4* 13.5 14.3  HCT 38.8*   < > 39.1   < > 38.1* 39.1 40.1 42.6 44.9    MCV 85.5   < > 84.8   < > 85.2 86.1 86.4 84.7 85.0  PLT 173   < > 222   < > 218 225 213 264 250   < > = values in this interval not displayed.   Basic Metabolic Panel: Recent Labs  Lab 01/04/20 0740 01/04/20 0740 01/05/20 0351 01/05/20 0351 01/06/20 0425 01/07/20 0429 01/07/20 1309 01/08/20 0428 01/09/20 0510  NA 136   < > 139  --  140 138  --  140 140  K 3.9   < > 3.9   < > 4.3 5.9* 3.5 3.5 3.6  CL 105   < > 106  --  106 107  --  102 101  CO2 18*   < > 22  --  25 20*  --  24 26  GLUCOSE 148*   < > 160*  --  161* 175*  --  159* 178*  BUN 58*   < > 70*  --  75* 76*  --  77* 79*  CREATININE 2.99*   < > 3.05*  --  2.77* 2.57*  --  2.65* 2.45*  CALCIUM 9.3   < > 9.1  --  9.2 9.0  --  9.2 9.6  MG 2.0  --  2.2  --  2.4 2.8*  --   --  2.5*  PHOS 3.5  --  4.6  --  4.3 4.1  --  3.6  --    < > = values in this interval not displayed.   GFR: Estimated Creatinine Clearance: 26.9 mL/min (A) (by C-G formula based on SCr of 2.45 mg/dL (H)). Liver Function Tests: Recent Labs  Lab 01/03/20 0558 01/03/20 0558 01/04/20 0740 01/05/20 0351 01/06/20 0425 01/07/20 0429 01/08/20 0428  AST 34  --  34 29 26 54*  --   ALT 38  --  37 34 33 36  --   ALKPHOS 55  --  55 51 53 54  --   BILITOT 0.7  --  0.6 0.4 0.5 1.8*  --   PROT 7.6  --  7.4 6.9 6.6 6.2*  --   ALBUMIN 3.4*   < > 3.6 3.5 3.3* 3.3* 3.6   < > = values in this interval not displayed.   No results for input(s): LIPASE, AMYLASE in the last 168 hours. No results for input(s): AMMONIA in the last 168 hours. Coagulation Profile: No results for input(s): INR, PROTIME in the last 168 hours. Cardiac Enzymes: No results for input(s): CKTOTAL, CKMB, CKMBINDEX, TROPONINI in the last 168 hours. BNP (last 3 results) No results for input(s): PROBNP in the last 8760 hours. HbA1C: No results for input(s): HGBA1C in the last 72 hours. CBG: Recent Labs  Lab 01/08/20 0809 01/08/20 1140 01/08/20 1652 01/08/20 2113 01/09/20 0757  GLUCAP  162* 305*  260* 236* 169*   Lipid Profile: No results for input(s): CHOL, HDL, LDLCALC, TRIG, CHOLHDL, LDLDIRECT in the last 72 hours. Thyroid Function Tests: No results for input(s): TSH, T4TOTAL, FREET4, T3FREE, THYROIDAB in the last 72 hours. Anemia Panel: Recent Labs    01/07/20 0429  FERRITIN 724*   Sepsis Labs: Recent Labs  Lab 01/02/20 1543 01/04/20 0740  PROCALCITON 0.35 0.38  LATICACIDVEN 0.9  --     Recent Results (from the past 240 hour(s))  Blood Culture (routine x 2)     Status: None   Collection Time: 01/02/20  3:43 PM   Specimen: Right Antecubital; Blood  Result Value Ref Range Status   Specimen Description RIGHT ANTECUBITAL  Final   Special Requests   Final    BOTTLES DRAWN AEROBIC AND ANAEROBIC Blood Culture adequate volume   Culture   Final    NO GROWTH 5 DAYS Performed at [facility], [address] [place], [place] [address]    Report Status 01/07/2020 FINAL  Final  Blood Culture (routine x 2)     Status: None   Collection Time: 01/02/20  3:54 PM   Specimen: Left Antecubital; Blood  Result Value Ref Range Status   Specimen Description LEFT ANTECUBITAL  Final   Special Requests   Final    BOTTLES DRAWN AEROBIC AND ANAEROBIC Blood Culture adequate volume   Culture   Final    NO GROWTH 5 DAYS Performed at [facility], [address], [place], [place] [address]    Report Status 01/07/2020 FINAL  Final  SARS CORONAVIRUS 2 (TAT 6-24 HRS) Nasopharyngeal Nasopharyngeal Swab     Status: Abnormal   Collection Time: 01/02/20  5:23 PM   Specimen: Nasopharyngeal Swab  Result Value Ref Range Status   SARS Coronavirus 2 POSITIVE (A) NEGATIVE Final    Comment: RESULT CALLED TO, READ BACK BY AND VERIFIED WITH: R. DAWAY,RN 0210 01/03/2020 T. TYSOR (NOTE) SARS-CoV-2 target nucleic acids are DETECTED. The SARS-CoV-2 RNA is generally detectable in upper and lower respiratory specimens during the acute phase of infection. Positive results are indicative of the  presence of SARS-CoV-2 RNA. Clinical correlation with patient history and other diagnostic information is  necessary to determine patient infection status. Positive results do not rule out bacterial infection or co-infection with other viruses.  The expected result is Negative. Fact Sheet for Patients: SugarRoll.be Fact Sheet for Healthcare Providers: https://www.woods-mathews.com/ This test is not yet approved or cleared by the [place] FDA and  has been authorized for detection and/or diagnosis of SARS-CoV-2 by FDA under an Emergency Use Authorization (EUA). This EUA will remain  in effect (meaning this test can be used) for th e duration of the COVID-19 declaration under Section 564(b)(1) of the Act, 21 U.S.C. section 360bbb-3(b)(1), unless the authorization is terminated or revoked sooner. Performed at [facility], [place] [address] [place], [place] [address]   MRSA PCR Screening     Status: None   Collection Time: 01/04/20  1:50 PM   Specimen: Nasal Mucosa; Nasopharyngeal  Result Value Ref Range Status   MRSA by PCR NEGATIVE NEGATIVE Final    Comment:        The GeneXpert MRSA Assay (FDA approved for NASAL specimens only), is one component of a comprehensive MRSA colonization surveillance program. It is not intended to diagnose MRSA infection nor to guide or monitor treatment for MRSA infections. Performed at [facility], [address], [place],  [address]  Radiology Studies: US Venous Img Lower Bilateral (DVT)  Result Date: 01/07/2020 CLINICAL DATA:  Bilateral lower extremity swelling EXAM: BILATERAL LOWER EXTREMITY VENOUS DOPPLER ULTRASOUND TECHNIQUE: Gray-scale sonography with graded compression, as well as color Doppler and duplex ultrasound were performed to evaluate the lower extremity deep venous systems from the level of the common femoral vein and including the common femoral, femoral,  profunda femoral, popliteal and calf veins including the posterior tibial, peroneal and gastrocnemius veins when visible. The superficial great saphenous vein was also interrogated. Spectral Doppler was utilized to evaluate flow at rest and with distal augmentation maneuvers in the common femoral, femoral and popliteal veins. COMPARISON:  None. FINDINGS: RIGHT LOWER EXTREMITY Common Femoral Vein: No evidence of thrombus. Normal compressibility, respiratory phasicity and response to augmentation. Saphenofemoral Junction: No evidence of thrombus. Normal compressibility and flow on color Doppler imaging. Profunda Femoral Vein: No evidence of thrombus. Normal compressibility and flow on color Doppler imaging. Femoral Vein: No evidence of thrombus. Normal compressibility, respiratory phasicity and response to augmentation. Popliteal Vein: No evidence of thrombus. Normal compressibility, respiratory phasicity and response to augmentation. Calf Veins: No evidence of thrombus. Normal compressibility and flow on color Doppler imaging. Superficial Great Saphenous Vein: No evidence of thrombus. Normal compressibility. Venous Reflux:  None. Other Findings:  None. LEFT LOWER EXTREMITY Common Femoral Vein: No evidence of thrombus. Normal compressibility, respiratory phasicity and response to augmentation. Saphenofemoral Junction: No evidence of thrombus. Normal compressibility and flow on color Doppler imaging. Profunda Femoral Vein: No evidence of thrombus. Normal compressibility and flow on color Doppler imaging. Femoral Vein: No evidence of thrombus. Normal compressibility, respiratory phasicity and response to augmentation. Popliteal Vein: No evidence of thrombus. Normal compressibility, respiratory phasicity and response to augmentation. Calf Veins: No evidence of thrombus. Normal compressibility and flow on color Doppler imaging. Superficial Great Saphenous Vein: No evidence of thrombus. Normal compressibility. Venous  Reflux:  None. Other Findings:  None. IMPRESSION: No evidence of deep venous thrombosis in either lower extremity. Electronically Signed   By: Constance Holster M.D.   On: 01/07/2020 19:57   DG Chest Port 1 View  Result Date: 01/07/2020 CLINICAL DATA:  Hypoxemia. Additional history provided: COVID positive 5 days ago. EXAM: PORTABLE CHEST 1 VIEW COMPARISON:  Chest radiograph 01/05/2020 FINDINGS: Heart size within normal limits. Similar appearance of heterogeneous bilateral airspace opacities throughout both lungs. No evidence of pleural effusion or pneumothorax. No acute bony abnormality. Overlying cardiac monitoring leads. IMPRESSION: No significant interval change in heterogeneous bilateral airspace disease. Findings are consistent with COVID pneumonia given the provided history. Electronically Signed   By: Kellie Simmering DO   On: 01/07/2020 11:36        Scheduled Meds: . albuterol  2 puff Inhalation Q6H  . alfuzosin  10 mg Oral Q breakfast  . ALPRAZolam  0.25 mg Oral BID  . vitamin C  500 mg Oral Daily  . Chlorhexidine Gluconate Cloth  6 each Topical Daily  . dexamethasone (DECADRON) injection  6 mg Intravenous Q24H  . feeding supplement (NEPRO CARB STEADY)  237 mL Oral BID BM  . heparin  7,500 Units Subcutaneous Q8H  . insulin aspart  0-5 Units Subcutaneous QHS  . insulin aspart  0-9 Units Subcutaneous TID WC  . insulin detemir  5 Units Subcutaneous Daily  . mouth rinse  15 mL Mouth Rinse BID  . metoprolol succinate  12.5 mg Oral Daily  . multivitamin with minerals  1 tablet Oral Daily  . saccharomyces boulardii  250 mg  Oral BID  . zinc sulfate  220 mg Oral Daily   Continuous Infusions:   LOS: 7 days    Time spent: 30 minutes    Maleik Vanderzee Darleen Crocker, DO [facility]  If 7PM-7AM, please contact night-coverage [email or web address] 01/09/2020, 10:17 AM

## 2020-01-09 NOTE — Progress Notes (Signed)
Inpatient Diabetes Program Recommendations  AACE/ADA: New Consensus Statement on Inpatient Glycemic Control (2015)  Target Ranges:  Prepandial:   less than 140 mg/dL      Peak postprandial:   less than 180 mg/dL (1-2 hours)      Critically ill patients:  140 - 180 mg/dL   Lab Results  Component Value Date   GLUCAP 332 (H) 01/09/2020   HGBA1C 6.1 (H) 01/02/2020    Review of Glycemic Control Results for Mike Davila, Mike Davila (MRN [id number]) as of 01/09/2020 14:02  Ref. Range 01/08/2020 11:40 01/08/2020 16:52 01/08/2020 21:13 01/09/2020 07:57 01/09/2020 11:16  Glucose-Capillary Latest Ref Range: 70 - 99 mg/dL 305 (H) 260 (H) 236 (H) 169 (H) 332 (H)   Diabetes history: None Outpatient Diabetes medications: None Current orders for Inpatient glycemic control:  Novolog sensitive tid with meals and HS Levemir 5 units daily Decadron 6 mg daily Inpatient Diabetes Program Recommendations:   Please consider adding Novolog 2 units tid with meals while on steroids.   Thanks  Adah Perl, RN, BC-ADM Inpatient Diabetes Coordinator Pager [phone] (8a-5p)

## 2020-01-10 DIAGNOSIS — N184 Chronic kidney disease, stage 4 (severe): Secondary | ICD-10-CM

## 2020-01-10 DIAGNOSIS — N179 Acute kidney failure, unspecified: Secondary | ICD-10-CM

## 2020-01-10 DIAGNOSIS — R7303 Prediabetes: Secondary | ICD-10-CM

## 2020-01-10 DIAGNOSIS — I10 Essential (primary) hypertension: Secondary | ICD-10-CM

## 2020-01-10 LAB — BASIC METABOLIC PANEL
Anion gap: 11 (ref 5–15)
BUN: 77 mg/dL — ABNORMAL HIGH (ref 8–23)
CO2: 25 mmol/L (ref 22–32)
Calcium: 9.2 mg/dL (ref 8.9–10.3)
Chloride: 100 mmol/L (ref 98–111)
Creatinine, Ser: 2.44 mg/dL — ABNORMAL HIGH (ref 0.61–1.24)
GFR calc Af Amer: 30 mL/min — ABNORMAL LOW (ref >60)
GFR calc non Af Amer: 25 mL/min — ABNORMAL LOW (ref >60)
Glucose, Bld: 156 mg/dL — ABNORMAL HIGH (ref 70–99)
Potassium: 3.7 mmol/L (ref 3.5–5.1)
Sodium: 136 mmol/L (ref 135–145)

## 2020-01-10 LAB — GLUCOSE, CAPILLARY
Glucose-Capillary: 143 mg/dL — ABNORMAL HIGH (ref 70–99)
Glucose-Capillary: 216 mg/dL — ABNORMAL HIGH (ref 70–99)
Glucose-Capillary: 221 mg/dL — ABNORMAL HIGH (ref 70–99)
Glucose-Capillary: 251 mg/dL — ABNORMAL HIGH (ref 70–99)

## 2020-01-10 MED ORDER — PRO-STAT SUGAR FREE PO LIQD
30.0000 mL | Freq: Three times a day (TID) | ORAL | Status: DC
  Administered 2020-01-10 – 2020-01-14 (×12): 30 mL via ORAL
  Filled 2020-01-10 (×11): qty 30

## 2020-01-10 MED ORDER — NEPRO/CARBSTEADY PO LIQD
237.0000 mL | Freq: Three times a day (TID) | ORAL | Status: DC
  Administered 2020-01-10 – 2020-01-13 (×8): 237 mL via ORAL

## 2020-01-10 NOTE — Progress Notes (Signed)
Inpatient Diabetes Program Recommendations  AACE/ADA: New Consensus Statement on Inpatient Glycemic Control (2015)  Target Ranges:  Prepandial:   less than 140 mg/dL      Peak postprandial:   less than 180 mg/dL (1-2 hours)      Critically ill patients:  140 - 180 mg/dL   Lab Results  Component Value Date   GLUCAP 216 (H) 01/10/2020   HGBA1C 6.1 (H) 01/02/2020    Review of Glycemic Control Results for VAIDEN, Mike Davila (MRN [id number]) as of 01/10/2020 12:45  Ref. Range 01/09/2020 11:16 01/09/2020 16:45 01/09/2020 21:37 01/10/2020 08:18 01/10/2020 12:04  Glucose-Capillary Latest Ref Range: 70 - 99 mg/dL 332 (H) 201 (H) 238 (H) 143 (H) 216 (H)   Diabetes history: DM2 Outpatient Diabetes medications: None Current orders for Inpatient glycemic control: levemir 5 units qd + novolog 0-9 units tid + 0-5 qhs + decadron 6 mg qd  Inpatient Diabetes Program Recommendations:     Please consider adding meal coverage, novolog 3 units tid with meals if eats at least 50% of meal  Thank you, Reche Dixon, RN, BSN Diabetes Coordinator Inpatient Diabetes Program [phone] (team pager from 8a-5p)

## 2020-01-10 NOTE — Care Management Important Message (Signed)
Important Message  Patient Details  Name: Mike Davila MRN: [id number] Date of Birth: [birth year]   Medicare Important Message Given:  Yes(RN will deliver due to precautions)     [name] 01/10/2020, 4:09 PM

## 2020-01-10 NOTE — Progress Notes (Signed)
PROGRESS NOTE    Mike Davila  ZDG:[id number] DOB: 02-19-1946 DOA: 01/02/2020 PCP: Mike Frizzle, MD   Brief Narrative:  Per HPI: Mike Davila a 74 y.o.malewith medical history significant forCKD4, hypertension,prediabetes. Patient presented to the ED with complaints of 1 week of difficulty breathing, and cough. He also reports some mild diarrhea. Patient spouse tested positive for COVID-19 infection March 1,she was sick but did not have any difficulty breathing,she did not require hospitalization.Patient has not been tested. He tells me he has not been vaccinated because he has not been able to get it/have access to it. He denies chest pain.  3/12:Patient was admitted with acute hypoxemic respiratory failure secondary to Covid pneumonia and has been started on remdesivir and dexamethasone along with Rocephin and azithromycin due to suspected superimposed bacterial infection. Continue current treatment plan and wean oxygen as tolerated.  3/13:Patient noted to have worsening hypoxemia and is now requiring up to 10 L high flow nasal cannula. He will be transferred to stepdown unit for further monitoring. Inflammatory markers are downward trending and chest x-ray appears stable. It appears that he may be having noncardiogenic pulmonary edema for which he will be given some IV Lasix and IV fluid will be further held. Continue to monitor renal panel closely. Appreciate nephrology involvement to assist with volume management in the setting of stage IV CKD.  3/14:Patient continues to have persistent hypoxemia requiring high flow nasal cannula. No respiratory distress noted. He has had net negative fluid balance of around 600 mL, but does not appear to have benefited from a respiratory standpoint from this. We will try Actemra today and discontinue IV antibiotics and see if this helps with improvement in his hypoxemia. No sign of PE currently noted and D-dimer is  downtrending.  3/15:Inflammatory markers continue to downtrend and renal function remained stable with nephrology following. Actemra given 3/14 and oxygen requirements are starting to decrease this morning. He continues to feel anxious for which I will schedule Xanax, low-dose twice a day.  3/16:Patient does not appear to be progressing despite Covid treatment and is persistently hypoxemic and even more so now this morning requiring 15 L high flow nasal cannula oxygen. Pulmonology consult ordered and appreciated. Chest x-ray with no new findings and ABG with worsening hypoxemic failure noted. Seen by nephrology and given 1 ampoule of bicarbonate as well as 1 dose of 80 mg IV Lasix this morning.  3/17: Patient is currently on 13 L high flow nasal cannula with no acute overnight events noted.  Seen by pulmonology yesterday with ultrasound of lower extremities ordered with no findings of DVT.  Continue current management.  He appears to have diuresed a little over 2 L from Lasix given by nephrology yesterday as well.  Potassium levels have normalized.  3/18: Patient continues to require 13 L high flow nasal cannula oxygen supplementation.  He will need to continue to be weaned slowly.  Continue on steroids as prescribed for now with remdesivir treatment completed.  Appreciate further nephrology recommendations for diuresis as appropriate.  3/19: Patient is still requiring high flow nasal cannula supplementation, down to 12 L O2 currently.  Reports feeling slightly better but is still short of breath and winded with minimal exertion.  Denies chest pain.  Expressed good urine output and Reported no chest pain, abdominal pain or dysuria.  Continue weaning oxygen supplementation as tolerated, start the use of flutter valve along with incentive spirometer and continue the use of his steroids.  No signs  of over imposed bacterial infection.  Assessment & Plan:   Principal Problem:   Pneumonia due to  COVID-19 virus Active Problems:   Hypertension   CKD (chronic kidney disease), stage III   Prediabetes   Acute respiratory failure with hypoxia (HCC)   Acute hypoxemic respiratory failure secondary to COVID-19 pneumonia-persistent -Actemra administered on 3/14 -Repeated chest x-ray stable on 3/16, demonstrating no significant interval change in bilateral airspace disease.  Finding consistent with Covid pneumonia given provided history. -Continue dexamethasone -Patient has completed 5 days of remdesivir. -Continue stepdown unit monitoringfor now and appreciate ongoing pulmonology and nephrology consultation/assistance. -Currently on 12 L high flow nasal cannula supplementation -Patient will be started on incentive spirometer and flutter valve -Continue bronchodilators and weaning O2 off process -Appears to have component of anxiety for which Xanax has been orderedas scheduled and as needed. -Follow clinical response and stability. -Continue supportive care.  Hypertension-stable -Continue home metoprololand monitor carefully -Vital signs stable.  AKI onCKD stage4-improved -Baseline creatinine 2.2-2.6 -Currentlyat 2.45,appreciate nephrology, patient has diuresed well after Lasix IV given. -continue lasix 40mg  BID now -no need for HD -Continue to monitor strict I's and O's as well as repeat labs in a.m., negative fluid balance noted. -Continue low-sodium diet.  Prediabetes-with steroid-induced hyperglycemia -Monitor carefully while on steroids -SSI -A1c 6.1% -Added Levemir 5 units daily on 01/09/2020; CBGs better controlled after that. -Continue to follow blood glucose level.  BPH -Continue home alfuzosin  Anxiety -Xanax as needed -Patient appears to be more relaxed.   DVT prophylaxis:Heparin Code Status:Full Family Communication:No family at bedside currently. Disposition Plan:Continue close monitoring as patient is in critical condition with high  oxygen requirements.  Continue to wean as tolerated.  Start the use of flutter valve, continue incentive spirometer and the steroids usage.   Consultants:  Nephrology  Pulmonology  Procedures:  See below  Antimicrobials:  Anti-infectives (From admission, onward)   Start     Dose/Rate Route Frequency Ordered Stop   01/03/20 1000  remdesivir 100 mg in sodium chloride 0.9 % 100 mL IVPB     100 mg 200 mL/hr over 30 Minutes Intravenous Daily 01/02/20 1822 01/06/20 0909   01/02/20 2200  remdesivir 100 mg in sodium chloride 0.9 % 100 mL IVPB     100 mg 200 mL/hr over 30 Minutes Intravenous Every 1 hr x 2 01/02/20 2154 01/03/20 0036   01/02/20 2000  remdesivir 200 mg in sodium chloride 0.9% 250 mL IVPB  Status:  Discontinued     200 mg 580 mL/hr over 30 Minutes Intravenous Once 01/02/20 1820 01/02/20 2320   01/02/20 1800  cefTRIAXone (ROCEPHIN) 1 g in sodium chloride 0.9 % 100 mL IVPB  Status:  Discontinued     1 g 200 mL/hr over 30 Minutes Intravenous Every 24 hours 01/02/20 1754 01/05/20 1119   01/02/20 1800  azithromycin (ZITHROMAX) 500 mg in sodium chloride 0.9 % 250 mL IVPB  Status:  Discontinued     500 mg 250 mL/hr over 60 Minutes Intravenous Every 24 hours 01/02/20 1754 01/05/20 1119      Subjective: Afebrile, reports no chest pain, abdominal pain or dysuria.  Still short of breath with minimal exertion and requiring high flow 12 L nasal cannula supplementation.  Patient easily desaturates with activity.  Objective: Vitals:   01/10/20 1200 01/10/20 1222 01/10/20 1300 01/10/20 1400  BP: 122/89  112/84 126/87  Pulse: 97 100 87 92  Resp: (!) 22 19 17 12   Temp:      TempSrc:  SpO2: 94% 90% 93% 95%  Weight:      Height:        Intake/Output Summary (Last 24 hours) at 01/10/2020 1456 Last data filed at 01/10/2020 1230 Gross per 24 hour  Intake 960 ml  Output 1600 ml  Net -640 ml   Filed Weights   01/08/20 0558 01/09/20 0500 01/10/20 0500  Weight: 88.9 kg  89.4 kg 91.3 kg    Examination: General exam: Alert, awake, oriented x 3, high flow nasal cannula supplementation in place (12 L); denies chest pain, nausea, vomiting or abdominal pain.  Patient is afebrile. Respiratory system: Bilateral rhonchi, no wheezing, no using accessory muscles.  Appears to be tachypneic with any exertion.  Some difficulty speaking in full sentences appreciated.   Cardiovascular system: No rubs, no gallops, no JVD on exam. Gastrointestinal system: Abdomen is nondistended, soft and nontender. No organomegaly or masses felt. Normal bowel sounds heard. Central nervous system: Alert and oriented. No focal neurological deficits. Extremities: No cyanosis or clubbing. Skin: No rashes, no petechiae. Psychiatry: Judgement and insight appear normal. Mood & affect appropriate.    Data Reviewed: I have personally reviewed following labs and imaging studies  CBC: Recent Labs  Lab 01/04/20 0740 01/04/20 0740 01/05/20 0351 01/06/20 0425 01/07/20 0429 01/08/20 0428 01/09/20 0510  WBC 6.6   < > 5.8 5.3 5.5 7.5 7.9  NEUTROABS 5.5  --  4.8 4.3 4.7  --   --   HGB 12.6*   < > 12.1* 12.3* 12.4* 13.5 14.3  HCT 39.1   < > 38.1* 39.1 40.1 42.6 44.9  MCV 84.8   < > 85.2 86.1 86.4 84.7 85.0  PLT 222   < > 218 225 213 264 250   < > = values in this interval not displayed.   Basic Metabolic Panel: Recent Labs  Lab 01/04/20 0740 01/04/20 0740 01/05/20 0351 01/05/20 0351 01/06/20 0425 01/06/20 0425 01/07/20 0429 01/07/20 1309 01/08/20 0428 01/09/20 0510 01/10/20 0532  NA 136   < > 139   < > 140  --  138  --  140 140 136  K 3.9   < > 3.9   < > 4.3   < > 5.9* 3.5 3.5 3.6 3.7  CL 105   < > 106   < > 106  --  107  --  102 101 100  CO2 18*   < > 22   < > 25  --  20*  --  24 26 25   GLUCOSE 148*   < > 160*   < > 161*  --  175*  --  159* 178* 156*  BUN 58*   < > 70*   < > 75*  --  76*  --  77* 79* 77*  CREATININE 2.99*   < > 3.05*   < > 2.77*  --  2.57*  --  2.65* 2.45* 2.44*    CALCIUM 9.3   < > 9.1   < > 9.2  --  9.0  --  9.2 9.6 9.2  MG 2.0  --  2.2  --  2.4  --  2.8*  --   --  2.5*  --   PHOS 3.5  --  4.6  --  4.3  --  4.1  --  3.6  --   --    < > = values in this interval not displayed.   GFR: Estimated Creatinine Clearance: 27.4 mL/min (A) (by C-G formula based on SCr  of 2.44 mg/dL (H)).   Liver Function Tests: Recent Labs  Lab 01/04/20 0740 01/05/20 0351 01/06/20 0425 01/07/20 0429 01/08/20 0428  AST 34 29 26 54*  --   ALT 37 34 33 36  --   ALKPHOS 55 51 53 54  --   BILITOT 0.6 0.4 0.5 1.8*  --   PROT 7.4 6.9 6.6 6.2*  --   ALBUMIN 3.6 3.5 3.3* 3.3* 3.6   CBG: Recent Labs  Lab 01/09/20 1116 01/09/20 1645 01/09/20 2137 01/10/20 0818 01/10/20 1204  GLUCAP 332* 201* 238* 143* 216*   Sepsis Labs: Recent Labs  Lab 01/04/20 0740  PROCALCITON 0.38    Recent Results (from the past 240 hour(s))  Blood Culture (routine x 2)     Status: None   Collection Time: 01/02/20  3:43 PM   Specimen: Right Antecubital; Blood  Result Value Ref Range Status   Specimen Description RIGHT ANTECUBITAL  Final   Special Requests   Final    BOTTLES DRAWN AEROBIC AND ANAEROBIC Blood Culture adequate volume   Culture   Final    NO GROWTH 5 DAYS Performed at [facility], [address], [place], [place] [address]    Report Status 01/07/2020 FINAL  Final  Blood Culture (routine x 2)     Status: None   Collection Time: 01/02/20  3:54 PM   Specimen: Left Antecubital; Blood  Result Value Ref Range Status   Specimen Description LEFT ANTECUBITAL  Final   Special Requests   Final    BOTTLES DRAWN AEROBIC AND ANAEROBIC Blood Culture adequate volume   Culture   Final    NO GROWTH 5 DAYS Performed at [facility], [address], [place], [place] [address]    Report Status 01/07/2020 FINAL  Final  SARS CORONAVIRUS 2 (TAT 6-24 HRS) Nasopharyngeal Nasopharyngeal Swab     Status: Abnormal   Collection Time: 01/02/20  5:23 PM   Specimen: Nasopharyngeal Swab   Result Value Ref Range Status   SARS Coronavirus 2 POSITIVE (A) NEGATIVE Final    Comment: RESULT CALLED TO, READ BACK BY AND VERIFIED WITH: R. DAWAY,RN 0210 01/03/2020 T. TYSOR (NOTE) SARS-CoV-2 target nucleic acids are DETECTED. The SARS-CoV-2 RNA is generally detectable in upper and lower respiratory specimens during the acute phase of infection. Positive results are indicative of the presence of SARS-CoV-2 RNA. Clinical correlation with patient history and other diagnostic information is  necessary to determine patient infection status. Positive results do not rule out bacterial infection or co-infection with other viruses.  The expected result is Negative. Fact Sheet for Patients: SugarRoll.be Fact Sheet for Healthcare Providers: https://www.woods-mathews.com/ This test is not yet approved or cleared by the [place] FDA and  has been authorized for detection and/or diagnosis of SARS-CoV-2 by FDA under an Emergency Use Authorization (EUA). This EUA will remain  in effect (meaning this test can be used) for th e duration of the COVID-19 declaration under Section 564(b)(1) of the Act, 21 U.S.C. section 360bbb-3(b)(1), unless the authorization is terminated or revoked sooner. Performed at [facility], [place] [address] [place], [place] [address]   MRSA PCR Screening     Status: None   Collection Time: 01/04/20  1:50 PM   Specimen: Nasal Mucosa; Nasopharyngeal  Result Value Ref Range Status   MRSA by PCR NEGATIVE NEGATIVE Final    Comment:        The GeneXpert MRSA Assay (FDA approved for NASAL specimens only), is one component of  a comprehensive MRSA colonization surveillance program. It is not intended to diagnose MRSA infection nor to guide or monitor treatment for MRSA infections. Performed at [facility], [address] [place], [place] [address]      Radiology Studies: No results found.   Scheduled Meds: .  albuterol  2 puff Inhalation Q6H  . alfuzosin  10 mg Oral Q breakfast  . ALPRAZolam  0.25 mg Oral BID  . vitamin C  500 mg Oral Daily  . Chlorhexidine Gluconate Cloth  6 each Topical Daily  . dexamethasone (DECADRON) injection  6 mg Intravenous Q24H  . feeding supplement (NEPRO CARB STEADY)  237 mL Oral BID BM  . furosemide  40 mg Oral BID  . heparin  7,500 Units Subcutaneous Q8H  . insulin aspart  0-5 Units Subcutaneous QHS  . insulin aspart  0-9 Units Subcutaneous TID WC  . insulin detemir  5 Units Subcutaneous Daily  . mouth rinse  15 mL Mouth Rinse BID  . metoprolol succinate  12.5 mg Oral Daily  . multivitamin with minerals  1 tablet Oral Daily  . saccharomyces boulardii  250 mg Oral BID  . zinc sulfate  220 mg Oral Daily   Continuous Infusions:   LOS: 8 days    Time spent: 35 minutes    Barton Dubois, MD [facility] [phone]  01/10/2020, 2:56 PM

## 2020-01-10 NOTE — Progress Notes (Signed)
Nutrition Follow-up  DOCUMENTATION CODES:   Obesity unspecified  INTERVENTION:  Continue Magic cup BID with meals, each supplement provides 290 kcal and 9 grams of protein  Increase Nepro Shake po to TID, each supplement provides 425 kcal and 19 grams protein  Provide Pro-stat 30 ml po TID, each supplement provides 100 kcal and 15 grams of protein  Recommend 2 gm Na diet when appropriate  NUTRITION DIAGNOSIS:   Increased nutrient needs related to catabolic illness(pneumonia due to COVID-19 virus) as evidenced by estimated needs.  Ongoing  GOAL:   Patient will meet greater than or equal to 90% of their needs  Progressing  MONITOR:   PO intake, Weight trends, Supplement acceptance, Labs, Diet advancement  REASON FOR ASSESSMENT:   Malnutrition Screening Tool    ASSESSMENT:  RD working remotely.  74 year old male with past medical history significant of CKD stage III, HTN, pre-diabetes presented with 1 week history of difficulty breathing, cough, and mild diarrhea. Patient reports that his spouse tested positive for COVID-19 on March 1 and did not require hospitalization. In ED, CXR showed extensive bilateral heterogenous airspace opacities with multifocal infection consistent with pneumonia due to COVID-19 virus infection.  Patient admitted on 3/11 with acute hypoxemic respiratory failure secondary to Covid pneumonia.  Diet order changed from Heart Healthy to Renal/123ml on 3/13. Patient with poor oral intake, per flowsheets on 3/16 he ate 0% x 1 documented meal on 3/17 he ate 0 - 10% x 3 documented meals, no documented meals on 3/18 and 0% of breakfast this morning. Patient noted with poor intake prior to admission and is at high risk for malnutrition. He is provided Nepro BID, accepting supplements at this time per medication review. Noted improved AKI, potassium within normal limits. Spoke with MD via secure chat in regards to liberalizing to 2 gm Na diet, per MD  liberalizing diet possibly could increase fluid retention, given component of renal dysfunction and heart failure. RD will increase Nepro and provide Pro-stat to aid with estimated needs.  Per notes: -continues to require HFNC O2 supplementation, down to 12 L -continue weaning O2 as tolerated -continue steroids, start use of flutter valve and spirometer -AKI on CKD IV improved -further recommendations for diuresis per nephrology as appropriate  I/Os: -2663 ml since admit    -600 ml x 24 hrs UOP: 1200 ml x 24 hrs  Non-pitting BLE edema noted per RN assessment. Admit wt 206.36 lbs Current wt 200.86 lbs  Medications reviewed and include: Vit C, Decadron, Lasix, SSI, Levemir, MVI, Florastor, Zinc sulfate  Labs: CBGs 216, 143, 238, 201 x 24 hrs Lab Results  Component Value Date   HGBA1C 6.1 (H) 01/02/2020    Diet Order:   Diet Order            Diet renal with fluid restriction Fluid restriction: 1200 mL Fluid; Room service appropriate? Yes; Fluid consistency: Thin  Diet effective now              EDUCATION NEEDS:   No education needs have been identified at this time  Skin:  Skin Assessment: Reviewed RN Assessment  Last BM:  3/19  Height:   Ht Readings from Last 1 Encounters:  01/02/20 5\' 3"  (1.6 m)    Weight:   Wt Readings from Last 1 Encounters:  01/10/20 91.3 kg    Ideal Body Weight:  56.4 kg  BMI:  Body mass index is 35.66 kg/m.  Estimated Nutritional Needs:   Kcal:  2100-2300 (  MSJ x 1.3 - 1.4)  Protein:  115-130 (1.2-1.3 g/kg)  Fluid:  >/= 2.1 L/day   Lajuan Lines, RD, LDN Clinical Nutrition After Hours/Weekend Pager # in [place]

## 2020-01-11 LAB — GLUCOSE, CAPILLARY
Glucose-Capillary: 167 mg/dL — ABNORMAL HIGH (ref 70–99)
Glucose-Capillary: 245 mg/dL — ABNORMAL HIGH (ref 70–99)
Glucose-Capillary: 383 mg/dL — ABNORMAL HIGH (ref 70–99)
Glucose-Capillary: 211 mg/dL — ABNORMAL HIGH (ref 70–99)

## 2020-01-11 MED ORDER — ALBUTEROL SULFATE HFA 108 (90 BASE) MCG/ACT IN AERS
2.0000 | INHALATION_SPRAY | Freq: Three times a day (TID) | RESPIRATORY_TRACT | Status: DC
  Administered 2020-01-12 – 2020-01-14 (×8): 2 via RESPIRATORY_TRACT

## 2020-01-11 NOTE — Progress Notes (Signed)
PROGRESS NOTE    Mike Davila  XBD:[id number] DOB: 1946/08/26 DOA: 01/02/2020 PCP: [name], MD   Brief Narrative:  Per HPI: Mike Davila a 74 y.o.malewith medical history significant forCKD4, hypertension,prediabetes. Patient presented to the ED with complaints of 1 week of difficulty breathing, and cough. He also reports some mild diarrhea. Patient spouse tested positive for COVID-19 infection March 1,she was sick but did not have any difficulty breathing,she did not require hospitalization.Patient has not been tested. He tells me he has not been vaccinated because he has not been able to get it/have access to it. He denies chest pain.  3/12:Patient was admitted with acute hypoxemic respiratory failure secondary to Covid pneumonia and has been started on remdesivir and dexamethasone along with Rocephin and azithromycin due to suspected superimposed bacterial infection. Continue current treatment plan and wean oxygen as tolerated.  3/13:Patient noted to have worsening hypoxemia and is now requiring up to 10 L high flow nasal cannula. He will be transferred to stepdown unit for further monitoring. Inflammatory markers are downward trending and chest x-ray appears stable. It appears that he may be having noncardiogenic pulmonary edema for which he will be given some IV Lasix and IV fluid will be further held. Continue to monitor renal panel closely. Appreciate nephrology involvement to assist with volume management in the setting of stage IV CKD.  3/14:Patient continues to have persistent hypoxemia requiring high flow nasal cannula. No respiratory distress noted. He has had net negative fluid balance of around 600 mL, but does not appear to have benefited from a respiratory standpoint from this. We will try Actemra today and discontinue IV antibiotics and see if this helps with improvement in his hypoxemia. No sign of PE currently noted and D-dimer is  downtrending.  3/15:Inflammatory markers continue to downtrend and renal function remained stable with nephrology following. Actemra given 3/14 and oxygen requirements are starting to decrease this morning. He continues to feel anxious for which I will schedule Xanax, low-dose twice a day.  3/16:Patient does not appear to be progressing despite Covid treatment and is persistently hypoxemic and even more so now this morning requiring 15 L high flow nasal cannula oxygen. Pulmonology consult ordered and appreciated. Chest x-ray with no new findings and ABG with worsening hypoxemic failure noted. Seen by nephrology and given 1 ampoule of bicarbonate as well as 1 dose of 80 mg IV Lasix this morning.  3/17: Patient is currently on 13 L high flow nasal cannula with no acute overnight events noted.  Seen by pulmonology yesterday with ultrasound of lower extremities ordered with no findings of DVT.  Continue current management.  He appears to have diuresed a little over 2 L from Lasix given by nephrology yesterday as well.  Potassium levels have normalized.  3/18: Patient continues to require 13 L high flow nasal cannula oxygen supplementation.  He will need to continue to be weaned slowly.  Continue on steroids as prescribed for now with remdesivir treatment completed.  Appreciate further nephrology recommendations for diuresis as appropriate.  3/19: Patient is still requiring high flow nasal cannula supplementation, down to 12 L O2 currently.  Reports feeling slightly better but is still short of breath and winded with minimal exertion.  Denies chest pain.  Expressed good urine output and Reported no chest pain, abdominal pain or dysuria.  Continue weaning oxygen supplementation as tolerated, start the use of flutter valve along with incentive spirometer and continue the use of his steroids.  No signs  of over imposed bacterial infection.  3/20: Some decrease in the amount of oxygen supplementation  appreciated today.  Patient is still short of breath, with mild difficulty speaking in full sentences and easily desaturating with exertion.  He is requiring 8-9 L of high flow nasal cannula oxygen supplementation at rest and around 10 while engaging with activity.  Continue IV steroids, continue the use of incentive spirometer and flutter valve.  Continue as needed bronchodilators.  Patient will be transferred to telemetry bed.  Assessment & Plan:   Principal Problem:   Pneumonia due to COVID-19 virus Active Problems:   Hypertension   CKD (chronic kidney disease), stage III   Prediabetes   Acute respiratory failure with hypoxia (HCC)   Acute hypoxemic respiratory failure secondary to COVID-19 pneumonia-persistent -Actemra administered on 3/14 -Repeated chest x-ray stable on 3/16, demonstrating no significant interval change in bilateral airspace disease.  Finding consistent with Covid pneumonia given provided history. -Continue dexamethasone -Patient has completed 5 days of remdesivir. -Continue stepdown unit monitoringfor now and appreciate ongoing pulmonology and nephrology consultation/assistance. -Currently on 8-9 L high flow nasal cannula supplementation; oxygen saturation goal is for 88-92%. -Patient will be started on incentive spirometer and flutter valve -Continue bronchodilators and weaning O2 off process -Appears to have component of anxiety for which Xanax has been orderedas scheduled and as needed. -Follow clinical response and stability. -Continue supportive care.  Hypertension-stable -Continue home metoprololand monitor carefully -Vital signs stable.  AKI onCKD stage4-improved -Baseline creatinine 2.2-2.6 -Currentlyat 2.45,appreciate nephrology, patient has diuresed well after Lasix IV given. -continue lasix 40mg  BID now -no need for HD -Continue to monitor strict I's and O's as well as repeat labs in a.m., negative fluid balance noted. -Continue  low-sodium diet.  Prediabetes-with steroid-induced hyperglycemia -Monitor carefully while on steroids -SSI -A1c 6.1% -Added Levemir 5 units daily on 01/09/2020; CBGs better controlled after that. -Continue to follow blood glucose level.  BPH -Continue home alfuzosin  Anxiety -Xanax as needed -Patient appears to be more relaxed.   DVT prophylaxis:Heparin Code Status:Full Family Communication:No family at bedside currently. Disposition Plan:Continue continue to remain in the hospital while weaning off oxygen supplementation; now that we have achieved less than 10 L will transfer to telemetry bed. Continue steroids, physical therapy evaluation; continue the use of flutter valve and incentive spirometer.   Consultants:  Nephrology  Pulmonology  Procedures:  See below  Antimicrobials:  Anti-infectives (From admission, onward)   Start     Dose/Rate Route Frequency Ordered Stop   01/03/20 1000  remdesivir 100 mg in sodium chloride 0.9 % 100 mL IVPB     100 mg 200 mL/hr over 30 Minutes Intravenous Daily 01/02/20 1822 01/06/20 0909   01/02/20 2200  remdesivir 100 mg in sodium chloride 0.9 % 100 mL IVPB     100 mg 200 mL/hr over 30 Minutes Intravenous Every 1 hr x 2 01/02/20 2154 01/03/20 0036   01/02/20 2000  remdesivir 200 mg in sodium chloride 0.9% 250 mL IVPB  Status:  Discontinued     200 mg 580 mL/hr over 30 Minutes Intravenous Once 01/02/20 1820 01/02/20 2320   01/02/20 1800  cefTRIAXone (ROCEPHIN) 1 g in sodium chloride 0.9 % 100 mL IVPB  Status:  Discontinued     1 g 200 mL/hr over 30 Minutes Intravenous Every 24 hours 01/02/20 1754 01/05/20 1119   01/02/20 1800  azithromycin (ZITHROMAX) 500 mg in sodium chloride 0.9 % 250 mL IVPB  Status:  Discontinued     500  mg 250 mL/hr over 60 Minutes Intravenous Every 24 hours 01/02/20 1754 01/05/20 1119      Subjective: No fever, no chest pain, no abdominal pain or dysuria.  Reports good urine output.  Still  requiring 8-9 L of high flow nasal cannula supplementation.  Reports feeling weak.  Objective: Vitals:   01/11/20 0651 01/11/20 0700 01/11/20 0738 01/11/20 0800  BP:  (!) 130/93  115/88  Pulse: 77 83  73  Resp: 20 18  (!) 21  Temp:      TempSrc:      SpO2: (!) 86% (!) 87% (!) 88% (!) 83%  Weight:      Height:        Intake/Output Summary (Last 24 hours) at 01/11/2020 0833 Last data filed at 01/11/2020 0701 Gross per 24 hour  Intake 720 ml  Output 800 ml  Net -80 ml   Filed Weights   01/08/20 0558 01/09/20 0500 01/10/20 0500  Weight: 88.9 kg 89.4 kg 91.3 kg    Examination: General exam: Alert, awake, oriented x 3; patient reports an improvement in his breathing but is still short of breath with minimal exertion; no fever, intermittent nonproductive cough and requiring 8-9 L high flow of nasal cannula supplementation. Respiratory system: Bilateral rhonchi appreciated; no wheezing, no using accessory muscles. Tachypneic with minimal exertion and mild difficulty speaking in full sentences appreciated. Cardiovascular system: RRR.  No rubs, no gallops, no JVD on examination. Gastrointestinal system: Abdomen is nondistended, soft and nontender. No organomegaly or masses felt. Normal bowel sounds heard. Central nervous system: Alert and oriented. No focal neurological deficits. Extremities: No cyanosis or clubbing. Skin: No rashes, no petechiae. Psychiatry: Judgement and insight appear normal. Mood & affect appropriate.   Data Reviewed: I have personally reviewed following labs and imaging studies  CBC: Recent Labs  Lab 01/04/20 0740 01/04/20 0740 01/05/20 0351 01/06/20 0425 01/07/20 0429 01/08/20 0428 01/09/20 0510  WBC 6.6   < > 5.8 5.3 5.5 7.5 7.9  NEUTROABS 5.5  --  4.8 4.3 4.7  --   --   HGB 12.6*   < > 12.1* 12.3* 12.4* 13.5 14.3  HCT 39.1   < > 38.1* 39.1 40.1 42.6 44.9  MCV 84.8   < > 85.2 86.1 86.4 84.7 85.0  PLT 222   < > 218 225 213 264 250   < > = values in  this interval not displayed.   Basic Metabolic Panel: Recent Labs  Lab 01/04/20 0740 01/04/20 0740 01/05/20 0351 01/05/20 0351 01/06/20 0425 01/06/20 0425 01/07/20 0429 01/07/20 1309 01/08/20 0428 01/09/20 0510 01/10/20 0532  NA 136   < > 139   < > 140  --  138  --  140 140 136  K 3.9   < > 3.9   < > 4.3   < > 5.9* 3.5 3.5 3.6 3.7  CL 105   < > 106   < > 106  --  107  --  102 101 100  CO2 18*   < > 22   < > 25  --  20*  --  24 26 25   GLUCOSE 148*   < > 160*   < > 161*  --  175*  --  159* 178* 156*  BUN 58*   < > 70*   < > 75*  --  76*  --  77* 79* 77*  CREATININE 2.99*   < > 3.05*   < > 2.77*  --  2.57*  --  2.65* 2.45* 2.44*  CALCIUM 9.3   < > 9.1   < > 9.2  --  9.0  --  9.2 9.6 9.2  MG 2.0  --  2.2  --  2.4  --  2.8*  --   --  2.5*  --   PHOS 3.5  --  4.6  --  4.3  --  4.1  --  3.6  --   --    < > = values in this interval not displayed.   GFR: Estimated Creatinine Clearance: 27.4 mL/min (A) (by C-G formula based on SCr of 2.44 mg/dL (H)).   Liver Function Tests: Recent Labs  Lab 01/04/20 0740 01/05/20 0351 01/06/20 0425 01/07/20 0429 01/08/20 0428  AST 34 29 26 54*  --   ALT 37 34 33 36  --   ALKPHOS 55 51 53 54  --   BILITOT 0.6 0.4 0.5 1.8*  --   PROT 7.4 6.9 6.6 6.2*  --   ALBUMIN 3.6 3.5 3.3* 3.3* 3.6   CBG: Recent Labs  Lab 01/10/20 0818 01/10/20 1204 01/10/20 1724 01/10/20 2049 01/11/20 0755  GLUCAP 143* 216* 221* 251* 167*   Sepsis Labs: Recent Labs  Lab 01/04/20 0740  PROCALCITON 0.38    Recent Results (from the past 240 hour(s))  Blood Culture (routine x 2)     Status: None   Collection Time: 01/02/20  3:43 PM   Specimen: Right Antecubital; Blood  Result Value Ref Range Status   Specimen Description RIGHT ANTECUBITAL  Final   Special Requests   Final    BOTTLES DRAWN AEROBIC AND ANAEROBIC Blood Culture adequate volume   Culture   Final    NO GROWTH 5 DAYS Performed at [facility], [address], [place], [place] [address]     Report Status 01/07/2020 FINAL  Final  Blood Culture (routine x 2)     Status: None   Collection Time: 01/02/20  3:54 PM   Specimen: Left Antecubital; Blood  Result Value Ref Range Status   Specimen Description LEFT ANTECUBITAL  Final   Special Requests   Final    BOTTLES DRAWN AEROBIC AND ANAEROBIC Blood Culture adequate volume   Culture   Final    NO GROWTH 5 DAYS Performed at [facility], [address], [place],  [address]    Report Status 01/07/2020 FINAL  Final  SARS CORONAVIRUS 2 (TAT 6-24 HRS) Nasopharyngeal Nasopharyngeal Swab     Status: Abnormal   Collection Time: 01/02/20  5:23 PM   Specimen: Nasopharyngeal Swab  Result Value Ref Range Status   SARS Coronavirus 2 POSITIVE (A) NEGATIVE Final    Comment: RESULT CALLED TO, READ BACK BY AND VERIFIED WITH: R. DAWAY,RN 0210 01/03/2020 T. TYSOR (NOTE) SARS-CoV-2 target nucleic acids are DETECTED. The SARS-CoV-2 RNA is generally detectable in upper and lower respiratory specimens during the acute phase of infection. Positive results are indicative of the presence of SARS-CoV-2 RNA. Clinical correlation with patient history and other diagnostic information is  necessary to determine patient infection status. Positive results do not rule out bacterial infection or co-infection with other viruses.  The expected result is Negative. Fact Sheet for Patients: SugarRoll.be Fact Sheet for Healthcare Providers: https://www.woods-mathews.com/ This test is not yet approved or cleared by the [place] FDA and  has been authorized for detection and/or diagnosis of SARS-CoV-2 by FDA under an Emergency Use Authorization (EUA). This EUA will remain  in effect (meaning this test can be used) for  th e duration of the COVID-19 declaration under Section 564(b)(1) of the Act, 21 U.S.C. section 360bbb-3(b)(1), unless the authorization is terminated or revoked sooner. Performed at [facility], [place] [address], [place], [place] [address]   MRSA PCR Screening     Status: None   Collection Time: 01/04/20  1:50 PM   Specimen: Nasal Mucosa; Nasopharyngeal  Result Value Ref Range Status   MRSA by PCR NEGATIVE NEGATIVE Final    Comment:        The GeneXpert MRSA Assay (FDA approved for NASAL specimens only), is one component of a comprehensive MRSA colonization surveillance program. It is not intended to diagnose MRSA infection nor to guide or monitor treatment for MRSA infections. Performed at [facility], [address], [place], [place] [address]      Radiology Studies: No results found.   Scheduled Meds: . albuterol  2 puff Inhalation Q6H  . alfuzosin  10 mg Oral Q breakfast  . ALPRAZolam  0.25 mg Oral BID  . vitamin C  500 mg Oral Daily  . Chlorhexidine Gluconate Cloth  6 each Topical Daily  . feeding supplement (NEPRO CARB STEADY)  237 mL Oral TID BM  . feeding supplement (PRO-STAT SUGAR FREE 64)  30 mL Oral TID  . furosemide  40 mg Oral BID  . heparin  7,500 Units Subcutaneous Q8H  . insulin aspart  0-5 Units Subcutaneous QHS  . insulin aspart  0-9 Units Subcutaneous TID WC  . insulin detemir  5 Units Subcutaneous Daily  . mouth rinse  15 mL Mouth Rinse BID  . metoprolol succinate  12.5 mg Oral Daily  . multivitamin with minerals  1 tablet Oral Daily  . saccharomyces boulardii  250 mg Oral BID  . zinc sulfate  220 mg Oral Daily   Continuous Infusions:   LOS: 9 days    Time spent: 35 minutes    Barton Dubois, MD [facility] [phone]  01/11/2020, 8:33 AM

## 2020-01-11 NOTE — Progress Notes (Signed)
Patient transferred from ICU to AP 341. Patient is alert and oriented x 4. Patient assessed and telemetry with continuous pulse ox placed on patient. Patient ambulated from bed to bathroom with moderate assistance. Patient was very unsteady and experienced dyspnea with exertion. Patient is currently on 7 L HFNC. Patient is a high fall risk and has bed alarm, call light and all personal items within reach. Patient refuses yellow nonskid socks he states they make his "feet hot." patient educated on the use of the call button and gave assurance that he will call for assistance. Will continue to monitor patient.

## 2020-01-11 NOTE — Progress Notes (Signed)
MD for today 01/11/20- PLEASE CALL DAUGHTER AND WIFE WITH UPDATE. Was told they would be called everyday, and very upset that no one spoke with them yesterday. Thank you.

## 2020-01-12 DIAGNOSIS — R0902 Hypoxemia: Secondary | ICD-10-CM

## 2020-01-12 LAB — GLUCOSE, CAPILLARY
Glucose-Capillary: 176 mg/dL — ABNORMAL HIGH (ref 70–99)
Glucose-Capillary: 166 mg/dL — ABNORMAL HIGH (ref 70–99)
Glucose-Capillary: 154 mg/dL — ABNORMAL HIGH (ref 70–99)
Glucose-Capillary: 151 mg/dL — ABNORMAL HIGH (ref 70–99)

## 2020-01-12 NOTE — Progress Notes (Signed)
PROGRESS NOTE    Mike Davila  GUY:[id number] DOB: 1946-08-22 DOA: 01/02/2020 PCP: [name], MD   Brief Narrative:  Per HPI: Mike Davila a 74 y.o.malewith medical history significant forCKD4, hypertension,prediabetes. Patient presented to the ED with complaints of 1 week of difficulty breathing, and cough. He also reports some mild diarrhea. Patient spouse tested positive for COVID-19 infection March 1,she was sick but did not have any difficulty breathing,she did not require hospitalization.Patient has not been tested. He tells me he has not been vaccinated because he has not been able to get it/have access to it. He denies chest pain.  3/12:Patient was admitted with acute hypoxemic respiratory failure secondary to Covid pneumonia and has been started on remdesivir and dexamethasone along with Rocephin and azithromycin due to suspected superimposed bacterial infection. Continue current treatment plan and wean oxygen as tolerated.  3/13:Patient noted to have worsening hypoxemia and is now requiring up to 10 L high flow nasal cannula. He will be transferred to stepdown unit for further monitoring. Inflammatory markers are downward trending and chest x-ray appears stable. It appears that he may be having noncardiogenic pulmonary edema for which he will be given some IV Lasix and IV fluid will be further held. Continue to monitor renal panel closely. Appreciate nephrology involvement to assist with volume management in the setting of stage IV CKD.  3/14:Patient continues to have persistent hypoxemia requiring high flow nasal cannula. No respiratory distress noted. He has had net negative fluid balance of around 600 mL, but does not appear to have benefited from a respiratory standpoint from this. We will try Actemra today and discontinue IV antibiotics and see if this helps with improvement in his hypoxemia. No sign of PE currently noted and D-dimer is  downtrending.  3/15:Inflammatory markers continue to downtrend and renal function remained stable with nephrology following. Actemra given 3/14 and oxygen requirements are starting to decrease this morning. He continues to feel anxious for which I will schedule Xanax, low-dose twice a day.  3/16:Patient does not appear to be progressing despite Covid treatment and is persistently hypoxemic and even more so now this morning requiring 15 L high flow nasal cannula oxygen. Pulmonology consult ordered and appreciated. Chest x-ray with no new findings and ABG with worsening hypoxemic failure noted. Seen by nephrology and given 1 ampoule of bicarbonate as well as 1 dose of 80 mg IV Lasix this morning.  3/17: Patient is currently on 13 L high flow nasal cannula with no acute overnight events noted.  Seen by pulmonology yesterday with ultrasound of lower extremities ordered with no findings of DVT.  Continue current management.  He appears to have diuresed a little over 2 L from Lasix given by nephrology yesterday as well.  Potassium levels have normalized.  3/18: Patient continues to require 13 L high flow nasal cannula oxygen supplementation.  He will need to continue to be weaned slowly.  Continue on steroids as prescribed for now with remdesivir treatment completed.  Appreciate further nephrology recommendations for diuresis as appropriate.  3/19: Patient is still requiring high flow nasal cannula supplementation, down to 12 L O2 currently.  Reports feeling slightly better but is still short of breath and winded with minimal exertion.  Denies chest pain.  Expressed good urine output and Reported no chest pain, abdominal pain or dysuria.  Continue weaning oxygen supplementation as tolerated, start the use of flutter valve along with incentive spirometer and continue the use of his steroids.  No signs  of over imposed bacterial infection.  3/20: Some decrease in the amount of oxygen supplementation  appreciated today.  Patient is still short of breath, with mild difficulty speaking in full sentences and easily desaturating with exertion.  He is requiring 8-9 L of high flow nasal cannula oxygen supplementation at rest and around 10 while engaging with activity.  Continue IV steroids, continue the use of incentive spirometer and flutter valve.  Continue as needed bronchodilators.  Patient will be transferred to telemetry bed.  3/21: Still on high flow nasal cannula supplementation using 6-7 L; no fever, no chest pain, no nausea, no vomiting.  Feeling weak and tired and expressing shortness of breath with minimal exertion.  Physical therapy evaluation pending to determine safe discharge plans.  Continue steroids and weaning oxygen.  Continue bronchodilators and mucolytic's.  Continue using incentive respirometer and flutter valve.  Hopefully home in the next 24-48 hours.  Renal function stable.  Assessment & Plan:   Principal Problem:   Pneumonia due to COVID-19 virus Active Problems:   Hypertension   CKD (chronic kidney disease), stage III   Prediabetes   Acute respiratory failure with hypoxia (HCC)   Acute hypoxemic respiratory failure secondary to COVID-19 pneumonia-persistent -Actemra administered on 3/14 -Repeated chest x-ray stable on 3/16, demonstrating no significant interval change in bilateral airspace disease.  Finding consistent with Covid pneumonia given provided history. -Continue dexamethasone -Patient has completed 5 days of remdesivir. -Continue stepdown unit monitoringfor now and appreciate ongoing pulmonology and nephrology consultation/assistance. -Currently on 8-9 L high flow nasal cannula supplementation; oxygen saturation goal is for 88-92%. -Patient will be started on incentive spirometer and flutter valve -Continue bronchodilators and weaning O2 off process -Appears to have component of anxiety for which Xanax has been orderedas scheduled and as needed. -Follow  clinical response and stability. -Continue supportive care.  Hypertension-stable -Continue home metoprololand monitor carefully -Vital signs stable.  AKI onCKD stage4-improved -Baseline creatinine 2.2-2.6 -Currentlyat 2.45,appreciate nephrology, patient has diuresed well after Lasix IV given. -continue lasix 40mg  BID now -no need for HD -Continue to monitor strict I's and O's as well as repeat labs in a.m., negative fluid balance noted. -Continue low-sodium diet.  Prediabetes-with steroid-induced hyperglycemia -Monitor carefully while on steroids -SSI -A1c 6.1% -Added Levemir 5 units daily on 01/09/2020; CBGs better controlled after that. -Continue to follow blood glucose level.  BPH -Continue home alfuzosin  Anxiety -Xanax as needed -Patient appears to be more relaxed.   DVT prophylaxis:Heparin Code Status:Full Family Communication:No family at bedside currently. Disposition Plan:Continue to remain in the hospital while weaning off oxygen supplementation; follow physical therapy recommendations; continue use of flutter valve and incentive spirometer.  Continue mucolytic's and bronchodilators.  Anticipate discharge home in the next 24 to 48 hours.  Consultants:  Nephrology  Pulmonology  Procedures:  See below  Antimicrobials:  Anti-infectives (From admission, onward)   Start     Dose/Rate Route Frequency Ordered Stop   01/03/20 1000  remdesivir 100 mg in sodium chloride 0.9 % 100 mL IVPB     100 mg 200 mL/hr over 30 Minutes Intravenous Daily 01/02/20 1822 01/06/20 0909   01/02/20 2200  remdesivir 100 mg in sodium chloride 0.9 % 100 mL IVPB     100 mg 200 mL/hr over 30 Minutes Intravenous Every 1 hr x 2 01/02/20 2154 01/03/20 0036   01/02/20 2000  remdesivir 200 mg in sodium chloride 0.9% 250 mL IVPB  Status:  Discontinued     200 mg 580 mL/hr over 30  Minutes Intravenous Once 01/02/20 1820 01/02/20 2320   01/02/20 1800  cefTRIAXone (ROCEPHIN)  1 g in sodium chloride 0.9 % 100 mL IVPB  Status:  Discontinued     1 g 200 mL/hr over 30 Minutes Intravenous Every 24 hours 01/02/20 1754 01/05/20 1119   01/02/20 1800  azithromycin (ZITHROMAX) 500 mg in sodium chloride 0.9 % 250 mL IVPB  Status:  Discontinued     500 mg 250 mL/hr over 60 Minutes Intravenous Every 24 hours 01/02/20 1754 01/05/20 1119      Subjective: No fever, no chest pain, no nausea, no vomiting.  Patient reports still feeling short of breath on exertion and being tired and weak.  Still requiring high flow nasal cannula supplementation 6-7 L.  Objective: Vitals:   01/12/20 0420 01/12/20 0752 01/12/20 0856 01/12/20 1359  BP: 127/87  (!) 145/92   Pulse: 90  90   Resp: 18  18   Temp: (!) 97.5 F (36.4 C)  (!) 97.5 F (36.4 C)   TempSrc: Oral  Oral   SpO2: 93% 94% 93% 94%  Weight: 90.1 kg     Height:        Intake/Output Summary (Last 24 hours) at 01/12/2020 1438 Last data filed at 01/12/2020 1200 Gross per 24 hour  Intake 767 ml  Output 1625 ml  Net -858 ml   Filed Weights   01/09/20 0500 01/10/20 0500 01/12/20 0420  Weight: 89.4 kg 91.3 kg 90.1 kg    Examination: General exam: Alert, awake, oriented x 3; reports feeling weak and tired; still short of breath with minimal exertion requiring 6-7 L nasal cannula high flow supplementation.  No fever, no nausea, no vomiting. Respiratory system: Positive rhonchi bilaterally; no wheezing, no crackles, no using accessory muscle. Cardiovascular system:RRR. No murmurs, no rubs, no gallops, no JVD on examination. Gastrointestinal system: Abdomen is nondistended, soft and nontender. No organomegaly or masses felt. Normal bowel sounds heard. Central nervous system: Alert and oriented. No focal neurological deficits. Extremities: No cyanosis or clubbing. Skin: No rashes, no petechiae. Psychiatry: Judgement and insight appear normal. Mood & affect appropriate.     Data Reviewed: I have personally reviewed following  labs and imaging studies  CBC: Recent Labs  Lab 01/06/20 0425 01/07/20 0429 01/08/20 0428 01/09/20 0510  WBC 5.3 5.5 7.5 7.9  NEUTROABS 4.3 4.7  --   --   HGB 12.3* 12.4* 13.5 14.3  HCT 39.1 40.1 42.6 44.9  MCV 86.1 86.4 84.7 85.0  PLT 225 213 264 294   Basic Metabolic Panel: Recent Labs  Lab 01/06/20 0425 01/06/20 0425 01/07/20 0429 01/07/20 1309 01/08/20 0428 01/09/20 0510 01/10/20 0532  NA 140  --  138  --  140 140 136  K 4.3   < > 5.9* 3.5 3.5 3.6 3.7  CL 106  --  107  --  102 101 100  CO2 25  --  20*  --  24 26 25   GLUCOSE 161*  --  175*  --  159* 178* 156*  BUN 75*  --  76*  --  77* 79* 77*  CREATININE 2.77*  --  2.57*  --  2.65* 2.45* 2.44*  CALCIUM 9.2  --  9.0  --  9.2 9.6 9.2  MG 2.4  --  2.8*  --   --  2.5*  --   PHOS 4.3  --  4.1  --  3.6  --   --    < > = values in this  interval not displayed.   GFR: Estimated Creatinine Clearance: 27.2 mL/min (A) (by C-G formula based on SCr of 2.44 mg/dL (H)).   Liver Function Tests: Recent Labs  Lab 01/06/20 0425 01/07/20 0429 01/08/20 0428  AST 26 54*  --   ALT 33 36  --   ALKPHOS 53 54  --   BILITOT 0.5 1.8*  --   PROT 6.6 6.2*  --   ALBUMIN 3.3* 3.3* 3.6   CBG: Recent Labs  Lab 01/11/20 1139 01/11/20 1628 01/11/20 2121 01/12/20 0849 01/12/20 1145  GLUCAP 245* 383* 211* 176* 166*   Sepsis Labs: No results for input(s): PROCALCITON, LATICACIDVEN in the last 168 hours.  Recent Results (from the past 240 hour(s))  Blood Culture (routine x 2)     Status: None   Collection Time: 01/02/20  3:43 PM   Specimen: Right Antecubital; Blood  Result Value Ref Range Status   Specimen Description RIGHT ANTECUBITAL  Final   Special Requests   Final    BOTTLES DRAWN AEROBIC AND ANAEROBIC Blood Culture adequate volume   Culture   Final    NO GROWTH 5 DAYS Performed at [facility], [address] [place], [place] [address]    Report Status 01/07/2020 FINAL  Final  Blood Culture (routine x 2)     Status:  None   Collection Time: 01/02/20  3:54 PM   Specimen: Left Antecubital; Blood  Result Value Ref Range Status   Specimen Description LEFT ANTECUBITAL  Final   Special Requests   Final    BOTTLES DRAWN AEROBIC AND ANAEROBIC Blood Culture adequate volume   Culture   Final    NO GROWTH 5 DAYS Performed at [facility], [address], [place], [place] [address]    Report Status 01/07/2020 FINAL  Final  SARS CORONAVIRUS 2 (TAT 6-24 HRS) Nasopharyngeal Nasopharyngeal Swab     Status: Abnormal   Collection Time: 01/02/20  5:23 PM   Specimen: Nasopharyngeal Swab  Result Value Ref Range Status   SARS Coronavirus 2 POSITIVE (A) NEGATIVE Final    Comment: RESULT CALLED TO, READ BACK BY AND VERIFIED WITH: R. DAWAY,RN 0210 01/03/2020 T. TYSOR (NOTE) SARS-CoV-2 target nucleic acids are DETECTED. The SARS-CoV-2 RNA is generally detectable in upper and lower respiratory specimens during the acute phase of infection. Positive results are indicative of the presence of SARS-CoV-2 RNA. Clinical correlation with patient history and other diagnostic information is  necessary to determine patient infection status. Positive results do not rule out bacterial infection or co-infection with other viruses.  The expected result is Negative. Fact Sheet for Patients: SugarRoll.be Fact Sheet for Healthcare Providers: https://www.woods-mathews.com/ This test is not yet approved or cleared by the [place] FDA and  has been authorized for detection and/or diagnosis of SARS-CoV-2 by FDA under an Emergency Use Authorization (EUA). This EUA will remain  in effect (meaning this test can be used) for th e duration of the COVID-19 declaration under Section 564(b)(1) of the Act, 21 U.S.C. section 360bbb-3(b)(1), unless the authorization is terminated or revoked sooner. Performed at [facility], [place] [address], [place], [place] [address]   MRSA PCR Screening      Status: None   Collection Time: 01/04/20  1:50 PM   Specimen: Nasal Mucosa; Nasopharyngeal  Result Value Ref Range Status   MRSA by PCR NEGATIVE NEGATIVE Final    Comment:        The GeneXpert MRSA Assay (FDA approved for NASAL specimens only), is  one component of a comprehensive MRSA colonization surveillance program. It is not intended to diagnose MRSA infection nor to guide or monitor treatment for MRSA infections. Performed at [facility], [address], [place], [place] [address]      Radiology Studies: No results found.   Scheduled Meds: . albuterol  2 puff Inhalation TID  . alfuzosin  10 mg Oral Q breakfast  . ALPRAZolam  0.25 mg Oral BID  . vitamin C  500 mg Oral Daily  . Chlorhexidine Gluconate Cloth  6 each Topical Daily  . feeding supplement (NEPRO CARB STEADY)  237 mL Oral TID BM  . feeding supplement (PRO-STAT SUGAR FREE 64)  30 mL Oral TID  . furosemide  40 mg Oral BID  . heparin  7,500 Units Subcutaneous Q8H  . insulin aspart  0-5 Units Subcutaneous QHS  . insulin aspart  0-9 Units Subcutaneous TID WC  . insulin detemir  5 Units Subcutaneous Daily  . mouth rinse  15 mL Mouth Rinse BID  . metoprolol succinate  12.5 mg Oral Daily  . multivitamin with minerals  1 tablet Oral Daily  . saccharomyces boulardii  250 mg Oral BID  . zinc sulfate  220 mg Oral Daily   Continuous Infusions:   LOS: 10 days    Time spent: 30 minutes    Barton Dubois, MD [facility] [phone]  01/12/2020, 2:38 PM

## 2020-01-13 ENCOUNTER — Encounter (HOSPITAL_COMMUNITY): Payer: Self-pay | Admitting: Internal Medicine

## 2020-01-13 ENCOUNTER — Inpatient Hospital Stay (HOSPITAL_COMMUNITY): Payer: Medicare Other

## 2020-01-13 LAB — BASIC METABOLIC PANEL
Anion gap: 15 (ref 5–15)
BUN: 96 mg/dL — ABNORMAL HIGH (ref 8–23)
CO2: 24 mmol/L (ref 22–32)
Calcium: 9.5 mg/dL (ref 8.9–10.3)
Chloride: 95 mmol/L — ABNORMAL LOW (ref 98–111)
Creatinine, Ser: 2.56 mg/dL — ABNORMAL HIGH (ref 0.61–1.24)
GFR calc Af Amer: 28 mL/min — ABNORMAL LOW (ref >60)
GFR calc non Af Amer: 24 mL/min — ABNORMAL LOW (ref >60)
Glucose, Bld: 156 mg/dL — ABNORMAL HIGH (ref 70–99)
Potassium: 3.4 mmol/L — ABNORMAL LOW (ref 3.5–5.1)
Sodium: 134 mmol/L — ABNORMAL LOW (ref 135–145)

## 2020-01-13 LAB — CBC
HCT: 45.9 % (ref 39.0–52.0)
Hemoglobin: 14.8 g/dL (ref 13.0–17.0)
MCH: 27.3 pg (ref 26.0–34.0)
MCHC: 32.2 g/dL (ref 30.0–36.0)
MCV: 84.5 fL (ref 80.0–100.0)
Platelets: 260 10*3/uL (ref 150–400)
RBC: 5.43 MIL/uL (ref 4.22–5.81)
RDW: 15.5 % (ref 11.5–15.5)
WBC: 10.2 10*3/uL (ref 4.0–10.5)
nRBC: 0 % (ref 0.0–0.2)

## 2020-01-13 LAB — GLUCOSE, CAPILLARY
Glucose-Capillary: 156 mg/dL — ABNORMAL HIGH (ref 70–99)
Glucose-Capillary: 145 mg/dL — ABNORMAL HIGH (ref 70–99)
Glucose-Capillary: 130 mg/dL — ABNORMAL HIGH (ref 70–99)
Glucose-Capillary: 121 mg/dL — ABNORMAL HIGH (ref 70–99)

## 2020-01-13 MED ORDER — POTASSIUM CHLORIDE CRYS ER 20 MEQ PO TBCR
40.0000 meq | EXTENDED_RELEASE_TABLET | Freq: Once | ORAL | Status: AC
  Administered 2020-01-13: 40 meq via ORAL
  Filled 2020-01-13: qty 2

## 2020-01-13 NOTE — Care Management Important Message (Signed)
Important Message  Patient Details  Name: Mike Davila MRN: [id number] Date of Birth: Mar 12, 1946   Medicare Important Message Given:  Yes     [name] 01/13/2020, 1:58 PM

## 2020-01-13 NOTE — Evaluation (Signed)
Physical Therapy Evaluation Patient Details Name: Mike Davila MRN: [id number] DOB: [birth year] Today's Date: 01/13/2020   History of Present Illness  Mike Davila is a 74 y.o. male with medical history significant for CKD 3, hypertension, prediabetes.  Patient presented to the ED with complaints of 1 week of difficulty breathing, and cough.  He also reports some mild diarrhea.  Patient spouse tested positive for COVID-19 infection March 1, she was sick but did not have any difficulty breathing, she did not require hospitalization. Patient has not been tested.He tells me he has not been vaccinated because he has not been able to get it/have access to it.He denies chest pain.    Clinical Impression  Patient functioning near baseline for functional mobility and gait, has to lean on nearby objects for support or hand held assist when taking steps in room, no loss of balance, limited secondary to fatigue and SpO2 at 93% after ambulation while on 7 LPM O2.  Patient tolerated staying up in chair after therapy.  Patient will benefit from continued physical therapy in hospital and recommended venue below to increase strength, balance, endurance for safe ADLs and gait.      Follow Up Recommendations Home health PT;Supervision for mobility/OOB;Supervision - Intermittent    Equipment Recommendations  None recommended by PT    Recommendations for Other Services       Precautions / Restrictions Precautions Precautions: Fall Restrictions Weight Bearing Restrictions: No      Mobility  Bed Mobility Overal bed mobility: Modified Independent             General bed mobility comments: HOB raised, increased time  Transfers Overall transfer level: Needs assistance Equipment used: 1 person hand held assist;None Transfers: Sit to/from [facility] Sit to Stand: Min guard Stand pivot transfers: Min guard       General transfer comment: slow labored movement, has to  lean on nearby objects for support  Ambulation/Gait Ambulation/Gait assistance: Min guard;Min assist Gait Distance (Feet): 25 Feet Assistive device: 1 person hand held assist;None Gait Pattern/deviations: Decreased step length - right;Decreased step length - left;Decreased stride length Gait velocity: decreased   General Gait Details: slow labored cadence with frequent leaning on nearby objects for support or requiring hand held assist, limited secondary to fatigue, on 7 LPM O2 with SpO2 at 93% after ambulation  Stairs            Wheelchair Mobility    Modified Rankin (Stroke Patients Only)       Balance Overall balance assessment: Needs assistance Sitting-balance support: Feet supported;No upper extremity supported Sitting balance-Leahy Scale: Good Sitting balance - Comments: seated in chair   Standing balance support: During functional activity;No upper extremity supported Standing balance-Leahy Scale: Poor Standing balance comment: fair/poor with hand held assist, leaning on nearby objects                             Pertinent Vitals/Pain Pain Assessment: No/denies pain    Home Living Family/patient expects to be discharged to:: Private residence Living Arrangements: Spouse/significant other Available Help at Discharge: Family;Available 24 hours/day Type of Home: House Home Access: Stairs to enter Entrance Stairs-Rails: None Entrance Stairs-Number of Steps: 1-2 Home Layout: One level Home Equipment: Walker - 2 wheels;Cane - single point;Shower seat;Bedside commode      Prior Function Level of Independence: Independent         Comments: household ambulator without AD, was not on  home O2     Hand Dominance        Extremity/Trunk Assessment   Upper Extremity Assessment Upper Extremity Assessment: Generalized weakness    Lower Extremity Assessment Lower Extremity Assessment: Generalized weakness    Cervical / Trunk  Assessment Cervical / Trunk Assessment: Normal  Communication   Communication: No difficulties  Cognition Arousal/Alertness: Awake/alert Behavior During Therapy: WFL for tasks assessed/performed Overall Cognitive Status: Within Functional Limits for tasks assessed                                        General Comments      Exercises     Assessment/Plan    PT Assessment Patient needs continued PT services  PT Problem List Decreased strength;Decreased activity tolerance;Decreased balance;Decreased mobility       PT Treatment Interventions Balance training;Stair training;Gait training;Functional mobility training;Therapeutic activities;Therapeutic exercise;Patient/family education    PT Goals (Current goals can be found in the Care Plan section)  Acute Rehab PT Goals Patient Stated Goal: return home with spouse and daughter to assist PT Goal Formulation: With patient Time For Goal Achievement: 01/20/20 Potential to Achieve Goals: Good    Frequency Min 3X/week   Barriers to discharge        Co-evaluation               AM-PAC PT "6 Clicks" Mobility  Outcome Measure Help needed turning from your back to your side while in a flat bed without using bedrails?: None Help needed moving from lying on your back to sitting on the side of a flat bed without using bedrails?: A Little Help needed moving to and from a bed to a chair (including a wheelchair)?: A Little Help needed standing up from a chair using your arms (e.g., wheelchair or bedside chair)?: A Little Help needed to walk in hospital room?: A Little Help needed climbing 3-5 steps with a railing? : A Lot 6 Click Score: 18    End of Session Equipment Utilized During Treatment: Oxygen Activity Tolerance: Patient tolerated treatment well;Patient limited by fatigue Patient left: in chair;with call bell/phone within reach Nurse Communication: Mobility status PT Visit Diagnosis: Unsteadiness on feet  (R26.81);Other abnormalities of gait and mobility (R26.89);Muscle weakness (generalized) (M62.81)    Time: 0102-7253 PT Time Calculation (min) (ACUTE ONLY): 25 min   Charges:   PT Evaluation $PT Eval Moderate Complexity: 1 Mod PT Treatments $Therapeutic Activity: 23-37 mins        10:49 AM, 01/13/20 Lonell Grandchild, MPT Physical Therapist with [facility] [phone] office [phone] mobile phone

## 2020-01-13 NOTE — Progress Notes (Signed)
Name: Mike Davila MRN: [id number] DOB: April 10, 1946    ADMISSION DATE:  01/02/2020 CONSULTATION DATE:  01/13/2020  REFERRING MD :  Hassell Halim   CHIEF COMPLAINT: Respiratory distress, hypoxia  BRIEF PATIENT DESCRIPTION: 74 year old man with hypertension and CKD who was admitted for Covid pneumonia and hypoxic respiratory failure .PCCM consulted for  persistent hypoxia. 1st day of symptoms  Around 12/26/19   SIGNIFICANT EVENTS   3/13 >> more hypoxic requiring 10 L high flow nasal cannula 3/14  given Actemra on 3/14, peak CRP was 15  STUDIES:  Echo 10/2017 normal LV function 3/16 ven duplex BLE >> neg  Scheduled Meds: . albuterol  2 puff Inhalation TID  . alfuzosin  10 mg Oral Q breakfast  . ALPRAZolam  0.25 mg Oral BID  . vitamin C  500 mg Oral Daily  . Chlorhexidine Gluconate Cloth  6 each Topical Daily  . feeding supplement (NEPRO CARB STEADY)  237 mL Oral TID BM  . feeding supplement (PRO-STAT SUGAR FREE 64)  30 mL Oral TID  . furosemide  40 mg Oral BID  . heparin  7,500 Units Subcutaneous Q8H  . insulin aspart  0-5 Units Subcutaneous QHS  . insulin aspart  0-9 Units Subcutaneous TID WC  . insulin detemir  5 Units Subcutaneous Daily  . mouth rinse  15 mL Mouth Rinse BID  . metoprolol succinate  12.5 mg Oral Daily  . multivitamin with minerals  1 tablet Oral Daily  . saccharomyces boulardii  250 mg Oral BID  . zinc sulfate  220 mg Oral Daily   Continuous Infusions: PRN Meds:.acetaminophen, ALPRAZolam, guaiFENesin-dextromethorphan, ondansetron **OR** ondansetron (ZOFRAN) IV, polyethylene glycol, sodium chloride    SUBJECTIVE:   VITAL SIGNS: Temp:  [97.8 F (36.6 C)-97.9 F (36.6 C)] 97.9 F (36.6 C) (03/22 0523) Pulse Rate:  [80-97] 97 (03/22 0523) Resp:  [16-18] 16 (03/22 0523) BP: (114-131)/(77-89) 117/89 (03/22 0523) SpO2:  [89 %-94 %] 92 % (03/22 0820) Weight:  [86.1 kg] 86.1 kg (03/22 0523)  PHYSICAL EXAMINATION:  Tmax 97.9  sats 92% on 6lpm NP    Pt  alert, approp nad @ sitting position in bed  No jvd Oropharynx clear,  mucosa nl Neck supple Lungs with slt coarsened bs with a few crackles in base bilaterally RRR no s3 or or sign murmur Abd mildly obese with nl  excursion  Extr warm with no edema or clubbing noted Neuro  Sensorium intact ,  no apparent motor deficits   IS  IC about 1750 cc         Recent Labs  Lab 01/09/20 0510 01/10/20 0532 01/13/20 0515  NA 140 136 134*  K 3.6 3.7 3.4*  CL 101 100 95*  CO2 26 25 24   BUN 79* 77* 96*  CREATININE 2.45* 2.44* 2.56*  GLUCOSE 178* 156* 156*   Recent Labs  Lab 01/08/20 0428 01/09/20 0510 01/13/20 0515  HGB 13.5 14.3 14.8  HCT 42.6 44.9 45.9  WBC 7.5 7.9 10.2  PLT 264 250 260   No results found.  ASSESSMENT / PLAN:  Acute hypoxic respiratory failure due to Covid pneumonia - Has already received Actemra and remdesivir -Comorbidities include diabetes, hypertension and CKD -venous duplex neg , doubt we need to pursue PE here     Rec:  -Continue nasal oxygen saturation 88 to 92% acceptable  -Incentive spirometry  Titrate 02 to sats > 90% sitting vs walking (cautioned him would need more walking less sitting, and he has an  oximeter at home   Will  recheck cxr prior to discharge and offered to see prn as outpt (he has my card)  Call  if questions - piulmonary f/u as inpt is also prn at this point    Christinia Gully, MD Pulmonary and [place] [phone] After 6:00 PM or weekends, use Beeper [phone]  After 7:00 pm call Elink  [phone]

## 2020-01-13 NOTE — Plan of Care (Signed)
  Problem: Acute Rehab PT Goals(only PT should resolve) Goal: Pt Will Go Supine/Side To Sit 01/13/2020 1051 by Lonell Grandchild, PT Outcome: Progressing Flowsheets (Taken 01/13/2020 1051) Pt will go Supine/Side to Sit: with modified independence 01/13/2020 1050 by Lonell Grandchild, PT Outcome: Progressing Goal: Pt Will Transfer Bed To Chair/Chair To Bed 01/13/2020 1051 by Lonell Grandchild, PT Outcome: Progressing Flowsheets (Taken 01/13/2020 1051) Pt will Transfer Bed to Chair/Chair to Bed: with supervision 01/13/2020 1050 by Lonell Grandchild, PT Outcome: Progressing Goal: Pt Will Ambulate 01/13/2020 1051 by Lonell Grandchild, PT Outcome: Progressing Flowsheets (Taken 01/13/2020 1051) Pt will Ambulate:  50 feet  with supervision  with rolling walker 01/13/2020 1050 by Lonell Grandchild, PT Outcome: Progressing   Problem: Acute Rehab PT Goals(only PT should resolve) Goal: Patient Will Transfer Sit To/From Stand Outcome: Progressing Flowsheets (Taken 01/13/2020 1051) Patient will transfer sit to/from stand:  with supervision  with modified independence   10:52 AM, 01/13/20 Lonell Grandchild, MPT Physical Therapist with [facility] 336 [phone] office [phone] mobile phone

## 2020-01-13 NOTE — Progress Notes (Signed)
PROGRESS NOTE    Mike Davila  LPF:[id number] DOB: [birth year] DOA: 01/02/2020 PCP: [name], MD   Brief Narrative:  Per HPI: [name] a 74 y.o.malewith medical history significant forCKD4, hypertension,prediabetes. Patient presented to the ED with complaints of 1 week of difficulty breathing, and cough. He also reports some mild diarrhea. Patient spouse tested positive for COVID-19 infection March 1,she was sick but did not have any difficulty breathing,she did not require hospitalization.Patient has not been tested. He tells me he has not been vaccinated because he has not been able to get it/have access to it. He denies chest pain.  3/12:Patient was admitted with acute hypoxemic respiratory failure secondary to Covid pneumonia and has been started on remdesivir and dexamethasone along with Rocephin and azithromycin due to suspected superimposed bacterial infection. Continue current treatment plan and wean oxygen as tolerated.  3/13:Patient noted to have worsening hypoxemia and is now requiring up to 10 L high flow nasal cannula. He will be transferred to stepdown unit for further monitoring. Inflammatory markers are downward trending and chest x-ray appears stable. It appears that he may be having noncardiogenic pulmonary edema for which he will be given some IV Lasix and IV fluid will be further held. Continue to monitor renal panel closely. Appreciate nephrology involvement to assist with volume management in the setting of stage IV CKD.  3/14:Patient continues to have persistent hypoxemia requiring high flow nasal cannula. No respiratory distress noted. He has had net negative fluid balance of around 600 mL, but does not appear to have benefited from a respiratory standpoint from this. We will try Actemra today and discontinue IV antibiotics and see if this helps with improvement in his hypoxemia. No sign of PE currently noted and D-dimer is  downtrending.  3/15:Inflammatory markers continue to downtrend and renal function remained stable with nephrology following. Actemra given 3/14 and oxygen requirements are starting to decrease this morning. He continues to feel anxious for which I will schedule Xanax, low-dose twice a day.  3/16:Patient does not appear to be progressing despite Covid treatment and is persistently hypoxemic and even more so now this morning requiring 15 L high flow nasal cannula oxygen. Pulmonology consult ordered and appreciated. Chest x-ray with no new findings and ABG with worsening hypoxemic failure noted. Seen by nephrology and given 1 ampoule of bicarbonate as well as 1 dose of 80 mg IV Lasix this morning.  3/17: Patient is currently on 13 L high flow nasal cannula with no acute overnight events noted.  Seen by pulmonology yesterday with ultrasound of lower extremities ordered with no findings of DVT.  Continue current management.  He appears to have diuresed a little over 2 L from Lasix given by nephrology yesterday as well.  Potassium levels have normalized.  3/18: Patient continues to require 13 L high flow nasal cannula oxygen supplementation.  He will need to continue to be weaned slowly.  Continue on steroids as prescribed for now with remdesivir treatment completed.  Appreciate further nephrology recommendations for diuresis as appropriate.  3/19: Patient is still requiring high flow nasal cannula supplementation, down to 12 L O2 currently.  Reports feeling slightly better but is still short of breath and winded with minimal exertion.  Denies chest pain.  Expressed good urine output and Reported no chest pain, abdominal pain or dysuria.  Continue weaning oxygen supplementation as tolerated, start the use of flutter valve along with incentive spirometer and continue the use of his steroids.  No signs  of over imposed bacterial infection.  3/20: Some decrease in the amount of oxygen supplementation  appreciated today.  Patient is still short of breath, with mild difficulty speaking in full sentences and easily desaturating with exertion.  He is requiring 8-9 L of high flow nasal cannula oxygen supplementation at rest and around 10 while engaging with activity.  Continue IV steroids, continue the use of incentive spirometer and flutter valve.  Continue as needed bronchodilators.  Patient will be transferred to telemetry bed.  3/21: Still on high flow nasal cannula supplementation using 6-7 L; no fever, no chest pain, no nausea, no vomiting.  Feeling weak and tired and expressing shortness of breath with minimal exertion.  Physical therapy evaluation pending to determine safe discharge plans.  Continue steroids and weaning oxygen.  Continue bronchodilators and mucolytic's.  Continue using incentive respirometer and flutter valve.  Hopefully home in the next 24-48 hours.  Renal function stable.  3/22: continue improving and with slightly lower O2 requirements, no CP, no fever and with stable electrolytes and renal function. Will follow PT rec's and set up/arrange home oxygen. Anticipate discharge on 01/14/20.   Assessment & Plan:   Principal Problem:   Pneumonia due to COVID-19 virus Active Problems:   Hypertension   CKD (chronic kidney disease), stage III   Prediabetes   Acute respiratory failure with hypoxia (HCC)   Acute hypoxemic respiratory failure secondary to COVID-19 pneumonia-persistent -Actemra administered on 3/14 -Repeated chest x-ray stable on 3/16, demonstrating no significant interval change in bilateral airspace disease.  Finding consistent with Covid pneumonia given provided history. -Continue dexamethasone -Patient has completed 5 days of remdesivir. -Continue stepdown unit monitoringfor now and appreciate ongoing pulmonology and nephrology consultation/assistance. -Currently on 6L high flow nasal cannula at rest. supplementation; oxygen saturation goal is for  88-92%. -Patient will be started on incentive spirometer and flutter valve -Continue bronchodilators and weaning O2 off process -Appears to have component of anxiety for which Xanax has been orderedas scheduled and as needed. -Follow clinical response and stability. -Continue supportive care.  Hypertension-stable -Continue home metoprololand monitor carefully -Vital signs stable.  AKI onCKD stage4-improved -Baseline creatinine 2.2-2.6 -Currentlyat 2.45,appreciate nephrology, patient has diuresed well after Lasix IV given. -continue lasix 40mg  BID now -no need for HD -Continue to monitor strict I's and O's as well as repeat labs in a.m., negative fluid balance noted. -Continue low-sodium diet.  Prediabetes-with steroid-induced hyperglycemia -Monitor carefully while on steroids -SSI -A1c 6.1% -Added Levemir 5 units daily on 01/09/2020; CBGs better controlled after that. -Continue to follow blood glucose level.  BPH -Continue home alfuzosin  Anxiety -Xanax as needed -Patient appears to be more relaxed.   DVT prophylaxis:Heparin Code Status:Full Family Communication:No family at bedside currently. Disposition Plan:Continue to remain in the hospital while weaning off oxygen supplementation; following physical therapy recommendations will arrange for HHPT and set up home oxygen prior to discharge. Continue use of flutter valve and incentive spirometer.  Continue mucolytic's and bronchodilators.  Anticipate discharge home in the next 24 hours.  Consultants:  Nephrology  Pulmonology  Procedures:  See below  Antimicrobials:  Anti-infectives (From admission, onward)   Start     Dose/Rate Route Frequency Ordered Stop   01/03/20 1000  remdesivir 100 mg in sodium chloride 0.9 % 100 mL IVPB     100 mg 200 mL/hr over 30 Minutes Intravenous Daily 01/02/20 1822 01/06/20 0909   01/02/20 2200  remdesivir 100 mg in sodium chloride 0.9 % 100 mL IVPB  100  mg 200 mL/hr over 30 Minutes Intravenous Every 1 hr x 2 01/02/20 2154 01/03/20 0036   01/02/20 2000  remdesivir 200 mg in sodium chloride 0.9% 250 mL IVPB  Status:  Discontinued     200 mg 580 mL/hr over 30 Minutes Intravenous Once 01/02/20 1820 01/02/20 2320   01/02/20 1800  cefTRIAXone (ROCEPHIN) 1 g in sodium chloride 0.9 % 100 mL IVPB  Status:  Discontinued     1 g 200 mL/hr over 30 Minutes Intravenous Every 24 hours 01/02/20 1754 01/05/20 1119   01/02/20 1800  azithromycin (ZITHROMAX) 500 mg in sodium chloride 0.9 % 250 mL IVPB  Status:  Discontinued     500 mg 250 mL/hr over 60 Minutes Intravenous Every 24 hours 01/02/20 1754 01/05/20 1119      Subjective: Breathing better today, still on 6L HFNC at rest, and experiencing SOB and transient desaturation on exertion. No CP, no nausea, no vomiting.  Objective: Vitals:   01/13/20 0523 01/13/20 0820 01/13/20 1411 01/13/20 1940  BP: 117/89     Pulse: 97     Resp: 16     Temp: 97.9 F (36.6 C)     TempSrc: Oral     SpO2: 91% 92% 93% 93%  Weight: 86.1 kg     Height:        Intake/Output Summary (Last 24 hours) at 01/13/2020 1955 Last data filed at 01/13/2020 1800 Gross per 24 hour  Intake 1068.5 ml  Output 2150 ml  Net -1081.5 ml   Filed Weights   01/10/20 0500 01/12/20 0420 01/13/20 0523  Weight: 91.3 kg 90.1 kg 86.1 kg    Examination: General exam: Alert, awake, oriented x 3; sitting up in the chair, feeling better and breathing slightly better. Still requiring 6L HFNC. No fever. Respiratory system: positive rhonchi bilaterally and noticed/reported tachypnea with activity. No using accessory muscles. No crackles. Cardiovascular system:RRR. No murmurs, rubs or gallops. No JVD. Gastrointestinal system: Abdomen is nondistended, soft and nontender. No organomegaly or masses felt. Normal bowel sounds heard. Central nervous system: Alert and oriented. No focal neurological deficits. Extremities: No Cyanosis, no  clubbing Skin: No rashes, no petechiae. Psychiatry: Judgement and insight appear normal. Mood & affect appropriate.    Data Reviewed: I have personally reviewed following labs and imaging studies  CBC: Recent Labs  Lab 01/07/20 0429 01/08/20 0428 01/09/20 0510 01/13/20 0515  WBC 5.5 7.5 7.9 10.2  NEUTROABS 4.7  --   --   --   HGB 12.4* 13.5 14.3 14.8  HCT 40.1 42.6 44.9 45.9  MCV 86.4 84.7 85.0 84.5  PLT 213 264 250 700   Basic Metabolic Panel: Recent Labs  Lab 01/07/20 0429 01/07/20 0429 01/07/20 1309 01/08/20 0428 01/09/20 0510 01/10/20 0532 01/13/20 0515  NA 138  --   --  140 140 136 134*  K 5.9*   < > 3.5 3.5 3.6 3.7 3.4*  CL 107  --   --  102 101 100 95*  CO2 20*  --   --  24 26 25 24   GLUCOSE 175*  --   --  159* 178* 156* 156*  BUN 76*  --   --  77* 79* 77* 96*  CREATININE 2.57*  --   --  2.65* 2.45* 2.44* 2.56*  CALCIUM 9.0  --   --  9.2 9.6 9.2 9.5  MG 2.8*  --   --   --  2.5*  --   --   PHOS 4.1  --   --  3.6  --   --   --    < > = values in this interval not displayed.   GFR: Estimated Creatinine Clearance: 25.3 mL/min (A) (by C-G formula based on SCr of 2.56 mg/dL (H)).   Liver Function Tests: Recent Labs  Lab 01/07/20 0429 01/08/20 0428  AST 54*  --   ALT 36  --   ALKPHOS 54  --   BILITOT 1.8*  --   PROT 6.2*  --   ALBUMIN 3.3* 3.6   CBG: Recent Labs  Lab 01/12/20 1627 01/12/20 2144 01/13/20 0811 01/13/20 1151 01/13/20 1552  GLUCAP 154* 151* 156* 145* 130*    Recent Results (from the past 240 hour(s))  MRSA PCR Screening     Status: None   Collection Time: 01/04/20  1:50 PM   Specimen: Nasal Mucosa; Nasopharyngeal  Result Value Ref Range Status   MRSA by PCR NEGATIVE NEGATIVE Final    Comment:        The GeneXpert MRSA Assay (FDA approved for NASAL specimens only), is one component of a comprehensive MRSA colonization surveillance program. It is not intended to diagnose MRSA infection nor to guide or monitor treatment  for MRSA infections. Performed at [facility], [address] [place], [place] [address]      Radiology Studies: [facility] Chest [place] 1 View  Result Date: 01/13/2020 CLINICAL DATA:  74 year old male with acute respiratory failure and hypoxemia. EXAM: PORTABLE CHEST 1 VIEW COMPARISON:  Chest radiograph date 01/07/2020. FINDINGS: Bilateral confluent airspace opacities primarily involving the peripheral and subpleural lung most consistent with multifocal pneumonia, likely viral or atypical in etiology including COVID-19. Overall similar or minimal progression of the pulmonary densities since the prior radiograph. There is no pleural effusion or pneumothorax. The cardiac silhouette is within limits. Atherosclerotic calcification of the aortic arch. No acute osseous pathology. IMPRESSION: Multifocal pneumonia without significant interval change. Continued follow-up recommended. Electronically Signed   By: Anner Crete M.D.   On: 01/13/2020 17:14    Scheduled Meds: . albuterol  2 puff Inhalation TID  . alfuzosin  10 mg Oral Q breakfast  . ALPRAZolam  0.25 mg Oral BID  . vitamin C  500 mg Oral Daily  . Chlorhexidine Gluconate Cloth  6 each Topical Daily  . feeding supplement (NEPRO CARB STEADY)  237 mL Oral TID BM  . feeding supplement (PRO-STAT SUGAR FREE 64)  30 mL Oral TID  . furosemide  40 mg Oral BID  . heparin  7,500 Units Subcutaneous Q8H  . insulin aspart  0-5 Units Subcutaneous QHS  . insulin aspart  0-9 Units Subcutaneous TID WC  . insulin detemir  5 Units Subcutaneous Daily  . mouth rinse  15 mL Mouth Rinse BID  . metoprolol succinate  12.5 mg Oral Daily  . multivitamin with minerals  1 tablet Oral Daily  . saccharomyces boulardii  250 mg Oral BID  . zinc sulfate  220 mg Oral Daily   Continuous Infusions:   LOS: 11 days    Time spent: 30 minutes    Barton Dubois, MD [facility] [phone]  01/13/2020, 7:55 PM

## 2020-01-14 DIAGNOSIS — Z6833 Body mass index (BMI) 33.0-33.9, adult: Secondary | ICD-10-CM

## 2020-01-14 DIAGNOSIS — E6609 Other obesity due to excess calories: Secondary | ICD-10-CM

## 2020-01-14 LAB — GLUCOSE, CAPILLARY
Glucose-Capillary: 170 mg/dL — ABNORMAL HIGH (ref 70–99)
Glucose-Capillary: 211 mg/dL — ABNORMAL HIGH (ref 70–99)
Glucose-Capillary: 119 mg/dL — ABNORMAL HIGH (ref 70–99)
Glucose-Capillary: 142 mg/dL — ABNORMAL HIGH (ref 70–99)

## 2020-01-14 MED ORDER — SALINE SPRAY 0.65 % NA SOLN: 1.0000 | NASAL | 1 refills | Status: DC | PRN

## 2020-01-14 MED ORDER — ADULT MULTIVITAMIN W/MINERALS CH: 1.0000 | ORAL_TABLET | Freq: Every day | ORAL | 1 refills | Status: AC

## 2020-01-14 MED ORDER — METOPROLOL SUCCINATE ER 25 MG PO TB24: 12.5000 mg | ORAL_TABLET | Freq: Every day | ORAL | 2 refills | Status: DC

## 2020-01-14 MED ORDER — ALBUTEROL SULFATE HFA 108 (90 BASE) MCG/ACT IN AERS: 2.0000 | INHALATION_SPRAY | Freq: Three times a day (TID) | RESPIRATORY_TRACT | 1 refills | Status: DC

## 2020-01-14 MED ORDER — FUROSEMIDE 40 MG PO TABS: 40.0000 mg | ORAL_TABLET | Freq: Two times a day (BID) | ORAL | 1 refills | Status: DC

## 2020-01-14 MED ORDER — PRO-STAT SUGAR FREE PO LIQD: 30.0000 mL | Freq: Three times a day (TID) | ORAL | 0 refills | Status: DC

## 2020-01-14 MED ORDER — ZINC SULFATE 220 (50 ZN) MG PO CAPS: 220.0000 mg | ORAL_CAPSULE | Freq: Every day | ORAL | 1 refills | Status: DC

## 2020-01-14 MED ORDER — GUAIFENESIN-DM 100-10 MG/5ML PO SYRP: 10.0000 mL | ORAL_SOLUTION | ORAL | 0 refills | Status: DC | PRN

## 2020-01-14 MED ORDER — ASCORBIC ACID 500 MG PO TABS: 500.0000 mg | ORAL_TABLET | Freq: Every day | ORAL | 1 refills | Status: AC

## 2020-01-14 MED ORDER — DEXAMETHASONE 6 MG PO TABS: 6.0000 mg | ORAL_TABLET | Freq: Every day | ORAL | 0 refills | Status: AC

## 2020-01-14 NOTE — Progress Notes (Signed)
SATURATION QUALIFICATIONS: (This note is used to comply with regulatory documentation for home oxygen)  Patient Saturations on Room Air at Rest = 92%  Patient Saturations on Room Air while Ambulating = 88%  Patient Saturations on 6 Liters of oxygen while Ambulating = 97%  Please briefly explain why patient needs home oxygen: Pt with (+) Covid diagnosis. SOB and dropped SaO2 with exertion.

## 2020-01-14 NOTE — Progress Notes (Signed)
Pt discharged via wheelchair to private vehicle. VSS. Pt's wife instructed on use of oxygen and oxygen tanks, including hands-on instruction and return demonstration/instruction on opening/closing tank, reading full/empty indicator gauge, changing regulator and adjusting flow rate of O2. Pt's wife completed satisfactory and gave good verbal return. Pt's wife and daughter instructed on use of aero-chamber with inhaler administration. Good verbal return from both.

## 2020-01-14 NOTE — Clinical Social Work Note (Signed)
Spoke with Mrs. Topor discussed HH and DME. PT and oxygen has been ordered for patient. Referrals for [facility] and DME discussed. Referrals made to [facility] with [facility] for PT and Blake Divine with [employer] for oxygen.     Niomie Englert, Clydene Pugh, LCSW

## 2020-01-14 NOTE — Discharge Summary (Signed)
Physician Discharge Summary  Mike Davila OMA:[id number] DOB: 1946/06/04 DOA: 01/02/2020  PCP: [name], MD  Admit date: 01/02/2020 Discharge date: 01/14/2020  Time spent: 35 minutes  Recommendations for Outpatient Follow-up:  1. Repeat basic metabolic panel to follow across renal function 2. Reassess blood pressure and further adjust antihypertensive treatment as needed 3. Close monitoring to patient CBGs after being on steroids with further determination to initiate hypoglycemic treatment if required. 4. Continue weaning oxygen supplementation of as tolerated repeat chest x-ray in 4-6 weeks to assure complete resolution of infiltrates.   Discharge Diagnoses:  Principal Problem:   Pneumonia due to COVID-19 virus Active Problems:   Hypertension   Acute renal failure superimposed on stage 4 chronic kidney disease (HCC)   CKD (chronic kidney disease), stage III   Prediabetes   Acute respiratory failure with hypoxia (HCC)   Class 1 obesity due to excess calories with body mass index (BMI) of 33.0 to 33.9 in adult   Discharge Condition: Stable and improved.  Patient discharged home with instruction to follow-up with PCP in 2 weeks.  Home health PT and oxygen supplementation arranged at time of discharge.  CODE STATUS: Full code.  Diet recommendation: Heart healthy diet and modified carbohydrate diet.  Filed Weights   01/12/20 0420 01/13/20 0523 01/14/20 0542  Weight: 90.1 kg 86.1 kg 85.1 kg    History of present illness:  As per H&P written by Dr. Denton Davila on 01/02/2020 74 y.o. male with medical history significant for CKD 3, hypertension, prediabetes.  Patient presented to the ED with complaints of 1 week of difficulty breathing, and cough.  He also reports some mild diarrhea.  Patient spouse tested positive for COVID-19 infection March 1, she was sick but did not have any difficulty breathing, she did not require hospitalization. Patient has not been tested. He tells  me he has not been vaccinated because he has not been able to get it/have access to it. He denies chest pain.  ED Course: O2 sats 87-89% on room air improved with 2 L nasal cannula to 95%.  Temperature 100.  Tachypneic.  WBC 3.7.  Normal lactic acid 3.9.  Elevated creatinine inflammatory markers.  Creatinine 2.65, at baseline.  Possible chest x-ray consistent with COVID-19 infection, showing extensive bilateral heterogeneous airspace opacities consistent with multifocal infection. POC COVID-19 test negative.  Covid PCR test pending.  Also elevated procalcitonin at 0.3.  Hospitalist admit for further evaluation and management.  Hospital Course:   Acute hypoxemic respiratory failure secondary to COVID-19 pneumonia-persistent -Actemra administered on 3/14 -Repeated chest x-ray stableon 3/16, demonstrating no significant interval change in bilateral airspace disease.  Finding consistent with Covid pneumonia given provided history. -Continue dexamethasone for 5 days at discharge. -Patient has completed 5 days of remdesivir while inpatient.. -Continue stepdown unit monitoringfor now and appreciateongoing pulmonology and nephrology consultation/assistance. -Currently on 5L high flow nasal cannula at rest. supplementation; oxygen saturation goal is for 88-92%. -Patient has been instructed to use incentive spirometer and flutter valve -Continue bronchodilators and weaning O2 off process over time as tolerated. -Outpatient follow-up with pulmonary service on as-needed basis.  Hypertension-stable -Continue home metoprololand Lasix at adjusted dose. -Vital signs stable. -Heart healthy diet has been recommended.  AKI onCKD stage4-improved -Baseline creatinine 2.2-2.6 -Currentlyat 2.5,appreciate nephrology,patient has diuresed well after Lasix IV given. -continue lasix 40mg  BID now -no need for HD -Continue to to follow daily weights and Continue low-sodium diet.  Prediabetes-with  steroid-induced hyperglycemia -Monitor carefully while on steroids -SSI used  inside hospital -A1c 6.1% -Modified carbohydrate diet has been encouraged at time of discharge.  BPH -Continue home alfuzosin -No symptoms of urinary retention reported at time of discharge.  Anxiety -Patient appears to be more relaxed. -As needed Xanax was used for hospitalized; patient can be reassessed by his PCP as an outpatient and further anxiolytic therapy to be decided.  Class I obesity -Body mass index is 33.23 kg/m. -Low calorie diet, portion control and increase physical activity discussed with patient.  Physical deconditioning -Home health PT has been recommended and arranged prior to discharge.  Procedures:  See below for x-ray reports  Consultations:  Nephrology service  Pulmonologist.  Discharge Exam: Vitals:   01/14/20 0924 01/14/20 1437  BP: 123/81   Pulse: 92   Resp: (!) 24   Temp:    SpO2: 98% 96%   General exam: Alert, awake, oriented x 3; sitting up in the chair, feeling better and breathing slightly better. Still requiring around 5L HFNC. No fever. Respiratory system: positive rhonchi bilaterally and noticed/reported tachypnea with activity. No using accessory muscles. No crackles. Cardiovascular system:RRR. No murmurs, rubs or gallops. No JVD. Gastrointestinal system: Abdomen is nondistended, soft and nontender. No organomegaly or masses felt. Normal bowel sounds heard. Central nervous system: Alert and oriented. No focal neurological deficits. Extremities: No Cyanosis, no clubbing Skin: No rashes, no petechiae. Psychiatry: Judgement and insight appear normal. Mood & affect appropriate.    Discharge Instructions   Discharge Instructions    Diet - low sodium heart healthy   Complete by: As directed    Discharge instructions   Complete by: As directed    Take medications as prescribed Continue increasing activity while pacing yourself Follow-up with PCP in 2  weeks Follow-up with pulmonologist after discharge as recommended.     Allergies as of 01/14/2020      Reactions   Sulfa Antibiotics Hives, Shortness Of Breath   Crestor [rosuvastatin Calcium] Other (See Comments)   Leg cramps   Lipitor [atorvastatin Calcium] Other (See Comments)   Leg cramps   Tetanus Toxoids Other (See Comments)   Unknown reaction      Medication List    STOP taking these medications   ibuprofen 200 MG tablet Commonly known as: ADVIL   potassium chloride SA 20 MEQ tablet Commonly known as: KLOR-CON     TAKE these medications   albuterol 108 (90 Base) MCG/ACT inhaler Commonly known as: VENTOLIN HFA Inhale 2 puffs into the lungs 3 (three) times daily.   alfuzosin 10 MG 24 hr tablet Commonly known as: UROXATRAL Take 10 mg by mouth daily with breakfast.   ascorbic acid 500 MG tablet Commonly known as: VITAMIN C Take 1 tablet (500 mg total) by mouth daily. Start taking on: January 15, 2020   atorvastatin 20 MG tablet Commonly known as: LIPITOR Take 1 tablet (20 mg total) by mouth daily.   dexamethasone 6 MG tablet Commonly known as: DECADRON Take 1 tablet (6 mg total) by mouth daily for 5 days.   feeding supplement (PRO-STAT SUGAR FREE 64) Liqd Take 30 mLs by mouth 3 (three) times daily.   furosemide 40 MG tablet Commonly known as: LASIX Take 1 tablet (40 mg total) by mouth 2 (two) times daily. What changed:   medication strength  how much to take   guaiFENesin-dextromethorphan 100-10 MG/5ML syrup Commonly known as: ROBITUSSIN DM Take 10 mLs by mouth every 4 (four) hours as needed for cough.   metoprolol succinate 25 MG 24 hr tablet Commonly  known as: TOPROL-XL Take 0.5 tablets (12.5 mg total) by mouth daily. Start taking on: January 15, 2020 What changed:   how much to take  additional instructions   multivitamin with minerals Tabs tablet Take 1 tablet by mouth daily. Start taking on: January 15, 2020   sodium chloride 0.65 % Soln  nasal spray Commonly known as: OCEAN Place 1 spray into both nostrils as needed for congestion.   zinc sulfate 220 (50 Zn) MG capsule Take 1 capsule (220 mg total) by mouth daily. Start taking on: January 15, 2020            Durable Medical Equipment  (From admission, onward)         Start     Ordered   01/14/20 1321  For home use only DME oxygen  Once    Question Answer Comment  Length of Need 6 Months   Mode or (Route) Nasal cannula   Liters per Minute 5   Frequency Continuous (stationary and portable oxygen unit needed)   Oxygen conserving device Yes   Oxygen delivery system Gas      01/14/20 1320         Allergies  Allergen Reactions  . Sulfa Antibiotics Hives and Shortness Of Breath  . Crestor [Rosuvastatin Calcium] Other (See Comments)    Leg cramps  . Lipitor [Atorvastatin Calcium] Other (See Comments)    Leg cramps  . Tetanus Toxoids Other (See Comments)    Unknown reaction   Follow-up Information    [name], MD. Schedule an appointment as soon as possible for a visit in 2 week(s).   Specialty: Family Medicine Contact information: [address] [place] [address] [phone]           The results of significant diagnostics from this hospitalization (including imaging, microbiology, ancillary and laboratory) are listed below for reference.    Significant Diagnostic Studies: DG Chest 1 View  Result Date: 01/05/2020 CLINICAL DATA:  Hypoxemia, COVID EXAM: CHEST  1 VIEW COMPARISON:  01/04/2020 FINDINGS: The heart size and mediastinal contours are within normal limits. No significant change in heterogeneous bilateral airspace opacity. The visualized skeletal structures are unremarkable. IMPRESSION: No significant change in heterogeneous bilateral airspace opacity, in keeping with COVID-19. Electronically Signed   By: Eddie Candle M.D.   On: 01/05/2020 10:57   DG Chest 1 View  Result Date: 01/04/2020 CLINICAL DATA:  Hypoxemia, COVID  positive EXAM: CHEST  1 VIEW COMPARISON:  01/02/2020 FINDINGS: Patchy left upper lobe and bilateral lower lobe opacities, unchanged. No pleural effusion or pneumothorax. The heart is normal in size. IMPRESSION: Stable multifocal pneumonia in this patient with known COVID. Electronically Signed   By: Julian Hy M.D.   On: 01/04/2020 09:10   US RENAL  Result Date: 01/05/2020 CLINICAL DATA:  Renal failure, CKD EXAM: RENAL / URINARY TRACT ULTRASOUND COMPLETE COMPARISON:  06/19/2018 FINDINGS: Right Kidney: Renal measurements: 10.9 x 4.6 x 4.8 cm = volume: 135 mL. Cortical thinning and increased echogenicity. No mass or hydronephrosis visualized. Left Kidney: Renal measurements: 13.4 x 5.9 x 6.2 cm = volume: 205 mL. Cortical thinning and increased echogenicity. Exophytic cyst of the left kidney measuring 4.5 cm. No mass or hydronephrosis visualized. Bladder: Appears normal for degree of bladder distention. Other: Enlarged prostate. IMPRESSION: 1. Bilateral cortical thinning and increased echogenicity, in keeping with medical renal disease. 2.  No hydronephrosis. 3.  Prostatomegaly. Electronically Signed   By: Eddie Candle M.D.   On:  01/05/2020 10:29   US Venous Img Lower Bilateral (DVT)  Result Date: 01/07/2020 CLINICAL DATA:  Bilateral lower extremity swelling EXAM: BILATERAL LOWER EXTREMITY VENOUS DOPPLER ULTRASOUND TECHNIQUE: Gray-scale sonography with graded compression, as well as color Doppler and duplex ultrasound were performed to evaluate the lower extremity deep venous systems from the level of the common femoral vein and including the common femoral, femoral, profunda femoral, popliteal and calf veins including the posterior tibial, peroneal and gastrocnemius veins when visible. The superficial great saphenous vein was also interrogated. Spectral Doppler was utilized to evaluate flow at rest and with distal augmentation maneuvers in the common femoral, femoral and popliteal veins. COMPARISON:   None. FINDINGS: RIGHT LOWER EXTREMITY Common Femoral Vein: No evidence of thrombus. Normal compressibility, respiratory phasicity and response to augmentation. Saphenofemoral Junction: No evidence of thrombus. Normal compressibility and flow on color Doppler imaging. Profunda Femoral Vein: No evidence of thrombus. Normal compressibility and flow on color Doppler imaging. Femoral Vein: No evidence of thrombus. Normal compressibility, respiratory phasicity and response to augmentation. Popliteal Vein: No evidence of thrombus. Normal compressibility, respiratory phasicity and response to augmentation. Calf Veins: No evidence of thrombus. Normal compressibility and flow on color Doppler imaging. Superficial Great Saphenous Vein: No evidence of thrombus. Normal compressibility. Venous Reflux:  None. Other Findings:  None. LEFT LOWER EXTREMITY Common Femoral Vein: No evidence of thrombus. Normal compressibility, respiratory phasicity and response to augmentation. Saphenofemoral Junction: No evidence of thrombus. Normal compressibility and flow on color Doppler imaging. Profunda Femoral Vein: No evidence of thrombus. Normal compressibility and flow on color Doppler imaging. Femoral Vein: No evidence of thrombus. Normal compressibility, respiratory phasicity and response to augmentation. Popliteal Vein: No evidence of thrombus. Normal compressibility, respiratory phasicity and response to augmentation. Calf Veins: No evidence of thrombus. Normal compressibility and flow on color Doppler imaging. Superficial Great Saphenous Vein: No evidence of thrombus. Normal compressibility. Venous Reflux:  None. Other Findings:  None. IMPRESSION: No evidence of deep venous thrombosis in either lower extremity. Electronically Signed   By: Constance Holster M.D.   On: 01/07/2020 19:57   DG Chest Port 1 View  Result Date: 01/13/2020 CLINICAL DATA:  74 year old male with acute respiratory failure and hypoxemia. EXAM: PORTABLE CHEST 1  VIEW COMPARISON:  Chest radiograph date 01/07/2020. FINDINGS: Bilateral confluent airspace opacities primarily involving the peripheral and subpleural lung most consistent with multifocal pneumonia, likely viral or atypical in etiology including COVID-19. Overall similar or minimal progression of the pulmonary densities since the prior radiograph. There is no pleural effusion or pneumothorax. The cardiac silhouette is within limits. Atherosclerotic calcification of the aortic arch. No acute osseous pathology. IMPRESSION: Multifocal pneumonia without significant interval change. Continued follow-up recommended. Electronically Signed   By: Anner Crete M.D.   On: 01/13/2020 17:14   DG Chest Port 1 View  Result Date: 01/07/2020 CLINICAL DATA:  Hypoxemia. Additional history provided: COVID positive 5 days ago. EXAM: PORTABLE CHEST 1 VIEW COMPARISON:  Chest radiograph 01/05/2020 FINDINGS: Heart size within normal limits. Similar appearance of heterogeneous bilateral airspace opacities throughout both lungs. No evidence of pleural effusion or pneumothorax. No acute bony abnormality. Overlying cardiac monitoring leads. IMPRESSION: No significant interval change in heterogeneous bilateral airspace disease. Findings are consistent with COVID pneumonia given the provided history. Electronically Signed   By: Kellie Simmering DO   On: 01/07/2020 11:36   DG Chest Port 1 View  Result Date: 01/02/2020 CLINICAL DATA:  Cough, shortness of breath, COVID positive contact EXAM: PORTABLE CHEST 1 VIEW COMPARISON:  None. FINDINGS: The heart size and mediastinal contours are within normal limits. Extensive bilateral heterogeneous airspace opacity. The visualized skeletal structures are unremarkable. IMPRESSION: Extensive bilateral heterogeneous airspace opacity, consistent CLINICAL DATA:  Cough, shortness of breath, COVID positive contact EXAM: PORTABLE CHEST 1 VIEW COMPARISON:  None. FINDINGS: The heart size and mediastinal  contours are within normal limits. Extensive bilateral heterogeneous airspace opacity. The visualized skeletal structures are unremarkable. IMPRESSION: Extensive bilateral heterogeneous airspace opacity, consistent with multifocal infection, potentially including COVID given positive contact. Electronically Signed   By: Eddie Candle M.D.   On: 01/02/2020 16:12    Microbiology: No results found for this or any previous visit (from the past 240 hour(s)).   Labs: Basic Metabolic Panel: Recent Labs  Lab 01/08/20 0428 01/09/20 0510 01/10/20 0532 01/13/20 0515  NA 140 140 136 134*  K 3.5 3.6 3.7 3.4*  CL 102 101 100 95*  CO2 24 26 25 24   GLUCOSE 159* 178* 156* 156*  BUN 77* 79* 77* 96*  CREATININE 2.65* 2.45* 2.44* 2.56*  CALCIUM 9.2 9.6 9.2 9.5  MG  --  2.5*  --   --   PHOS 3.6  --   --   --    Liver Function Tests: Recent Labs  Lab 01/08/20 0428  ALBUMIN 3.6   CBC: Recent Labs  Lab 01/08/20 0428 01/09/20 0510 01/13/20 0515  WBC 7.5 7.9 10.2  HGB 13.5 14.3 14.8  HCT 42.6 44.9 45.9  MCV 84.7 85.0 84.5  PLT 264 250 260   BNP (last 3 results) Recent Labs    01/04/20 0740  BNP 93.0    CBG: Recent Labs  Lab 01/13/20 1552 01/13/20 2127 01/14/20 0808 01/14/20 0848 01/14/20 1136  GLUCAP 130* 121* 170* 211* 119*    Signed:  Barton Dubois MD.  [facility] 01/14/2020, 2:51 PM

## 2020-01-15 DIAGNOSIS — J1282 Pneumonia due to coronavirus disease 2019: Secondary | ICD-10-CM | POA: Diagnosis not present

## 2020-01-15 DIAGNOSIS — N200 Calculus of kidney: Secondary | ICD-10-CM | POA: Diagnosis not present

## 2020-01-15 DIAGNOSIS — D631 Anemia in chronic kidney disease: Secondary | ICD-10-CM | POA: Diagnosis not present

## 2020-01-15 DIAGNOSIS — J9601 Acute respiratory failure with hypoxia: Secondary | ICD-10-CM | POA: Diagnosis not present

## 2020-01-15 DIAGNOSIS — N179 Acute kidney failure, unspecified: Secondary | ICD-10-CM | POA: Diagnosis not present

## 2020-01-15 DIAGNOSIS — U071 COVID-19: Secondary | ICD-10-CM | POA: Diagnosis not present

## 2020-01-15 DIAGNOSIS — I129 Hypertensive chronic kidney disease with stage 1 through stage 4 chronic kidney disease, or unspecified chronic kidney disease: Secondary | ICD-10-CM | POA: Diagnosis not present

## 2020-01-15 DIAGNOSIS — Z9981 Dependence on supplemental oxygen: Secondary | ICD-10-CM | POA: Diagnosis not present

## 2020-01-15 DIAGNOSIS — N401 Enlarged prostate with lower urinary tract symptoms: Secondary | ICD-10-CM | POA: Diagnosis not present

## 2020-01-15 DIAGNOSIS — I4510 Unspecified right bundle-branch block: Secondary | ICD-10-CM | POA: Diagnosis not present

## 2020-01-15 DIAGNOSIS — K5790 Diverticulosis of intestine, part unspecified, without perforation or abscess without bleeding: Secondary | ICD-10-CM | POA: Diagnosis not present

## 2020-01-15 DIAGNOSIS — N184 Chronic kidney disease, stage 4 (severe): Secondary | ICD-10-CM | POA: Diagnosis not present

## 2020-01-15 DIAGNOSIS — E782 Mixed hyperlipidemia: Secondary | ICD-10-CM | POA: Diagnosis not present

## 2020-01-15 DIAGNOSIS — R7303 Prediabetes: Secondary | ICD-10-CM | POA: Diagnosis not present

## 2020-01-15 DIAGNOSIS — N201 Calculus of ureter: Secondary | ICD-10-CM | POA: Diagnosis not present

## 2020-01-16 ENCOUNTER — Telehealth: Payer: Self-pay | Admitting: Family Medicine

## 2020-01-16 ENCOUNTER — Telehealth: Payer: Self-pay | Admitting: *Deleted

## 2020-01-16 NOTE — Telephone Encounter (Signed)
Called to follow up with patient post hospitalization. Patient was discharged on 01/14/2020. Patient states that he is doing ok. He is still currently on oxygen, and still has complaints of generalized fatigue states that he is having some difficulty with appetite at times he can eat fairly well and other times he is unable to eat. Nurse with [facility] called in today for orders. Scheduled patient for an hospital follow up with Dr. Dennard Schaumann on 01/23/2020 at 2:30 PM

## 2020-01-16 NOTE — Telephone Encounter (Signed)
Received call from Amy, [facility] PT with [facility].   Reports that patient was discharged from hospital on 01/14/2020 with [facility] orders D/T PNA secondary to COVID. Reports that patient requires Hospital F/U appointment. Patient was diagnosed with COVID on 01/02/2020.  Requested orders for PT 2x weekly x4 weeks, then 1x weekly x4 weeks for post hospitalization follow up, equipment needs, fall prevention, functional mobility, independence and safety. VO given.   Reports that patient was prescribed ProStat from the hospital. States that medication is very hard to find in sufficient quantities. Inquired if patient can be moved to high protein diet and supplement with Ensure protein. VO given.   Also states that patient was given low dose Xanax while in hospital for anxiety. Family states this was very effective and requested order for prescription. Advised this can be discussed at [place].

## 2020-01-17 DIAGNOSIS — J1282 Pneumonia due to coronavirus disease 2019: Secondary | ICD-10-CM | POA: Diagnosis not present

## 2020-01-17 DIAGNOSIS — J9601 Acute respiratory failure with hypoxia: Secondary | ICD-10-CM | POA: Diagnosis not present

## 2020-01-17 DIAGNOSIS — N184 Chronic kidney disease, stage 4 (severe): Secondary | ICD-10-CM | POA: Diagnosis not present

## 2020-01-17 DIAGNOSIS — I129 Hypertensive chronic kidney disease with stage 1 through stage 4 chronic kidney disease, or unspecified chronic kidney disease: Secondary | ICD-10-CM | POA: Diagnosis not present

## 2020-01-17 DIAGNOSIS — U071 COVID-19: Secondary | ICD-10-CM | POA: Diagnosis not present

## 2020-01-17 DIAGNOSIS — N179 Acute kidney failure, unspecified: Secondary | ICD-10-CM | POA: Diagnosis not present

## 2020-01-19 DIAGNOSIS — N184 Chronic kidney disease, stage 4 (severe): Secondary | ICD-10-CM | POA: Diagnosis not present

## 2020-01-19 DIAGNOSIS — U071 COVID-19: Secondary | ICD-10-CM | POA: Diagnosis not present

## 2020-01-19 DIAGNOSIS — J1282 Pneumonia due to coronavirus disease 2019: Secondary | ICD-10-CM | POA: Diagnosis not present

## 2020-01-19 DIAGNOSIS — J9601 Acute respiratory failure with hypoxia: Secondary | ICD-10-CM | POA: Diagnosis not present

## 2020-01-19 DIAGNOSIS — N179 Acute kidney failure, unspecified: Secondary | ICD-10-CM | POA: Diagnosis not present

## 2020-01-19 DIAGNOSIS — I129 Hypertensive chronic kidney disease with stage 1 through stage 4 chronic kidney disease, or unspecified chronic kidney disease: Secondary | ICD-10-CM | POA: Diagnosis not present

## 2020-01-20 ENCOUNTER — Telehealth: Payer: Self-pay | Admitting: Family Medicine

## 2020-01-20 ENCOUNTER — Other Ambulatory Visit: Payer: Self-pay | Admitting: Family Medicine

## 2020-01-20 MED ORDER — ALPRAZOLAM 0.5 MG PO TABS: 0.5000 mg | ORAL_TABLET | Freq: Three times a day (TID) | ORAL | 0 refills | Status: DC | PRN

## 2020-01-20 NOTE — Telephone Encounter (Signed)
[facility] nurse - Pt was given Xanax while in hospital and is scared of everything and having a lot of anxiety since having COVID and would like to know if you would send him in some Xanax. He has a Hosp f/u scheduled for 01/23/2020.

## 2020-01-20 NOTE — Telephone Encounter (Signed)
Tried to call pt no answer and no vm.

## 2020-01-20 NOTE — Telephone Encounter (Signed)
Sure, I will.

## 2020-01-21 DIAGNOSIS — I129 Hypertensive chronic kidney disease with stage 1 through stage 4 chronic kidney disease, or unspecified chronic kidney disease: Secondary | ICD-10-CM | POA: Diagnosis not present

## 2020-01-21 DIAGNOSIS — N184 Chronic kidney disease, stage 4 (severe): Secondary | ICD-10-CM | POA: Diagnosis not present

## 2020-01-21 DIAGNOSIS — J9601 Acute respiratory failure with hypoxia: Secondary | ICD-10-CM | POA: Diagnosis not present

## 2020-01-21 DIAGNOSIS — J1282 Pneumonia due to coronavirus disease 2019: Secondary | ICD-10-CM | POA: Diagnosis not present

## 2020-01-21 DIAGNOSIS — N179 Acute kidney failure, unspecified: Secondary | ICD-10-CM | POA: Diagnosis not present

## 2020-01-21 DIAGNOSIS — U071 COVID-19: Secondary | ICD-10-CM | POA: Diagnosis not present

## 2020-01-22 NOTE — Telephone Encounter (Signed)
Pt has apt 01/23/2020 Closing note

## 2020-01-23 ENCOUNTER — Encounter: Payer: Self-pay | Admitting: Family Medicine

## 2020-01-23 ENCOUNTER — Ambulatory Visit (INDEPENDENT_AMBULATORY_CARE_PROVIDER_SITE_OTHER): Payer: Medicare Other | Admitting: Family Medicine

## 2020-01-23 ENCOUNTER — Other Ambulatory Visit: Payer: Self-pay

## 2020-01-23 VITALS — BP 112/74 | HR 98 | Temp 96.9°F | Resp 16 | Ht 66.0 in | Wt 197.0 lb

## 2020-01-23 DIAGNOSIS — J1282 Pneumonia due to coronavirus disease 2019: Secondary | ICD-10-CM | POA: Diagnosis not present

## 2020-01-23 DIAGNOSIS — J9611 Chronic respiratory failure with hypoxia: Secondary | ICD-10-CM | POA: Diagnosis not present

## 2020-01-23 DIAGNOSIS — U071 COVID-19: Secondary | ICD-10-CM | POA: Diagnosis not present

## 2020-01-23 DIAGNOSIS — R7303 Prediabetes: Secondary | ICD-10-CM | POA: Diagnosis not present

## 2020-01-23 LAB — COMPLETE METABOLIC PANEL WITH GFR
AG Ratio: 1.9 (calc) (ref 1.0–2.5)
ALT: 113 U/L — ABNORMAL HIGH (ref 9–46)
AST: 42 U/L — ABNORMAL HIGH (ref 10–35)
Albumin: 3.7 g/dL (ref 3.6–5.1)
Alkaline phosphatase (APISO): 75 U/L (ref 35–144)
BUN/Creatinine Ratio: 17 (calc) (ref 6–22)
BUN: 36 mg/dL — ABNORMAL HIGH (ref 7–25)
CO2: 27 mmol/L (ref 20–32)
Calcium: 9.3 mg/dL (ref 8.6–10.3)
Chloride: 97 mmol/L — ABNORMAL LOW (ref 98–110)
Creat: 2.15 mg/dL — ABNORMAL HIGH (ref 0.70–1.18)
GFR, Est African American: 34 mL/min/{1.73_m2} — ABNORMAL LOW (ref >=60)
GFR, Est Non African American: 30 mL/min/{1.73_m2} — ABNORMAL LOW (ref >=60)
Globulin: 1.9 g/dL (calc) (ref 1.9–3.7)
Glucose, Bld: 124 mg/dL — ABNORMAL HIGH (ref 65–99)
Potassium: 3.4 mmol/L — ABNORMAL LOW (ref 3.5–5.3)
Sodium: 138 mmol/L (ref 135–146)
Total Bilirubin: 0.5 mg/dL (ref 0.2–1.2)
Total Protein: 5.6 g/dL — ABNORMAL LOW (ref 6.1–8.1)

## 2020-01-23 LAB — CBC WITH DIFFERENTIAL/PLATELET
Absolute Monocytes: 518 cells/uL (ref 200–950)
Basophils Absolute: 32 cells/uL (ref 0–200)
Basophils Relative: 0.6 %
Eosinophils Absolute: 140 cells/uL (ref 15–500)
Eosinophils Relative: 2.6 %
HCT: 41.5 % (ref 38.5–50.0)
Hemoglobin: 13.6 g/dL (ref 13.2–17.1)
Lymphs Abs: 637 cells/uL — ABNORMAL LOW (ref 850–3900)
MCH: 27.8 pg (ref 27.0–33.0)
MCHC: 32.8 g/dL (ref 32.0–36.0)
MCV: 84.7 fL (ref 80.0–100.0)
MPV: 10.5 fL (ref 7.5–12.5)
Monocytes Relative: 9.6 %
Neutro Abs: 4072 cells/uL (ref 1500–7800)
Neutrophils Relative %: 75.4 %
Platelets: 98 10*3/uL — ABNORMAL LOW (ref 140–400)
RBC: 4.9 10*6/uL (ref 4.20–5.80)
RDW: 15.9 % — ABNORMAL HIGH (ref 11.0–15.0)
Total Lymphocyte: 11.8 %
WBC: 5.4 10*3/uL (ref 3.8–10.8)

## 2020-01-23 MED ORDER — ALFUZOSIN HCL ER 10 MG PO TB24: 10.0000 mg | ORAL_TABLET | Freq: Every day | ORAL | 3 refills | Status: DC

## 2020-01-23 NOTE — Progress Notes (Signed)
Subjective:    Patient ID: Mike Davila, male    DOB: 1946-05-10, 74 y.o.   MRN: [id number]  HPI  Unfortunately, the patient was recently admitted to the hospital with COVID-19.  He subsequently developed pneumonia related to this and oxygen dependent respiratory failure.  Patient states at 1 point he was on 15 L however he was recently discharged from the hospital on 6 L via nasal cannula.  I have copied relevant portions of his discharge summary below and included them for my reference.  Admit date: 01/02/2020 Discharge date: 01/14/2020  Time spent: 35 minutes  Recommendations for Outpatient Follow-up:  1. Repeat basic metabolic panel to follow across renal function 2. Reassess blood pressure and further adjust antihypertensive treatment as needed 3. Close monitoring to patient CBGs after being on steroids with further determination to initiate hypoglycemic treatment if required. 4. Continue weaning oxygen supplementation of as tolerated repeat chest x-ray in 4-6 weeks to assure complete resolution of infiltrates.   Discharge Diagnoses:  Principal Problem:   Pneumonia due to COVID-19 virus Active Problems:   Hypertension   Acute renal failure superimposed on stage 4 chronic kidney disease (HCC)   CKD (chronic kidney disease), stage III   Prediabetes   Acute respiratory failure with hypoxia (HCC)   Class 1 obesity due to excess calories with body mass index (BMI) of 33.0 to 33.9 in adult   Discharge Condition: Stable and improved.  Patient discharged home with instruction to follow-up with PCP in 2 weeks.  Home health PT and oxygen supplementation arranged at time of discharge.  CODE STATUS: Full code.  Diet recommendation: Heart healthy diet and modified carbohydrate diet.       Filed Weights   01/12/20 0420 01/13/20 0523 01/14/20 0542  Weight: 90.1 kg 86.1 kg 85.1 kg    History of present illness:  As per H&P written by Dr. Denton Brick on 01/02/2020 73  y.o.malewith medical history significant forCKD 3, hypertension,prediabetes. Patient presented to the ED with complaints of 1 week of difficulty breathing, and cough. He also reports some mild diarrhea. Patient spouse tested positive for COVID-19 infection March 1,she was sick but did not have any difficulty breathing,she did not require hospitalization.Patient has not been tested. He tells me he has not been vaccinated because he has not been able to get it/have access to it. He denies chest pain.  ED Course:O2 sats 87-89% on room air improved with 2 L nasal cannula to 95%. Temperature 100. Tachypneic. WBC 3.7. Normal lactic acid 3.9. Elevated creatinine inflammatory markers. Creatinine 2.65, at baseline. Possible chest x-ray consistent with COVID-19 infection,showing extensive bilateral heterogeneous airspace opacities consistent with multifocal infection.POCCOVID-19 test negative. Covid PCR test pending. Also elevated procalcitonin at 0.3. Hospitalist admit for further evaluation and management.  Hospital Course:   Acute hypoxemic respiratory failure secondary to COVID-19 pneumonia-persistent -Actemra administered on 3/14 -Repeated chest x-ray stableon 3/16, demonstrating no significant interval change in bilateral airspace disease. Finding consistent with Covid pneumonia given provided history. -Continue dexamethasone for 5 days at discharge. -Patient has completed 5 days of remdesivir while inpatient.. -Continue stepdown unit monitoringfor now and appreciateongoing pulmonology and nephrology consultation/assistance. -Currently on5L high flow nasal cannulaat rest.supplementation; oxygen saturation goal is for 88-92%. -Patient has been instructed to use incentive spirometer and flutter valve -Continue bronchodilators and weaning O2 off process over time as tolerated. -Outpatient follow-up with pulmonary service on as-needed  basis.  Hypertension-stable -Continue home metoprololand Lasix at adjusted dose. -Vital signs stable. -Heart healthy diet  has been recommended.  AKI onCKD stage4-improved -Baseline creatinine 2.2-2.6 -Currentlyat 2.5,appreciate nephrology,patient has diuresed well after Lasix IV given. -continue lasix 40mg  BID now -no need for HD -Continue to to follow daily weights and Continue low-sodium diet.  Prediabetes-with steroid-induced hyperglycemia -Monitor carefully while on steroids -SSI used inside hospital -A1c 6.1% -Modified carbohydrate diet has been encouraged at time of discharge.  BPH -Continue home alfuzosin -No symptoms of urinary retention reported at time of discharge.  Anxiety -Patient appears to be more relaxed. -As needed Xanax was used for hospitalized; patient can be reassessed by his PCP as an outpatient and further anxiolytic therapy to be decided.  Class I obesity -Body mass index is 33.23 kg/m. -Low calorie diet, portion control and increase physical activity discussed with patient.  Physical deconditioning -Home health PT has been recommended and arranged prior to discharge.  Procedures:  See below for x-ray reports  Consultations:  Nephrology service  Pulmonologist.   Patient is here today with his family for follow-up.  He is currently wearing 6 L however he admits that he takes it off sometimes when he is resting.  I took his oxygen off and while he was sitting still, we had a conversation.  His oxygen dropped to 92 to 93% on room air at rest.  However with ambulation off oxygen, his oxygen level quickly plummeted to 86 on room air.  Ultimately the patient required 3 L of oxygen via nasal cannula to maintain oxygen saturations greater than 90% with activity.  Therefore I recommended that the patient maintain oxygen 3 L via nasal cannula with activity and at night while he sleeps.  However he does appear to be improving.  He denies any  chest pain shortness of breath or dyspnea on exertion.  He states that he cannot really tell that he needs the oxygen and is eager to get rid of it.  He is eating better now.  He denies any nausea or vomiting.  He denies any diarrhea.  He denies any dysuria.  He denies any blood in his stool.  He has completed his steroids. Past Medical History:  Diagnosis Date  . Anemia   . Benign localized prostatic hyperplasia with lower urinary tract symptoms (LUTS)   . CKD (chronic kidney disease), stage III    nephrologist-  dr Hinda Lenis ([facility] in [place])-- per pt last visit 06/ 2019  . Diverticulosis of colon   . ED (erectile dysfunction)   . History of acute gouty arthritis 10/23/2017  . History of acute renal failure 10/23/2017   hospital admission-- w/ acute tubular necrosis superimposed on ckd 3--- resolved  . History of diverticulitis of colon 10/05/2017   admission-- mild-- resolved w/ medical management  . History of metabolic acidosis 62/95/2841   admission-- resolved  . History of palpitations    approx. 2009, per pt no issue since  . History of urinary retention 10/2017  . Hypertension   . Incomplete right bundle branch block   . Mixed hyperlipidemia   . Nephrolithiasis    left side nonobstructive  . Pre-diabetes   . Right ureteral stone   . Wears dentures    upper    We discussed his history of acute tubular necrosis.  He still occasionally takes Advil for low back pain.  He does this occasionally.  I explained to the patient that NSAIDs are toxic to the kidney potentially and that he should avoid this.  He does have a history of prediabetes and he is due  to recheck his blood sugar and a hemoglobin A1c. Past Surgical History:  Procedure Laterality Date  . ANKLE SURGERY Bilateral 1990s to early 2000s   removal bone spurs  . APPENDECTOMY  child  . COLONOSCOPY N/A 10/27/2017   Procedure: COLONOSCOPY;  Surgeon: Rogene Houston, MD;  Location: [facility];  Service: Endoscopy;   Laterality: N/A;  . CYSTOSCOPY/URETEROSCOPY/HOLMIUM LASER/STENT PLACEMENT Right 05/09/2018   Procedure: CYSTOSCOPY RIGHT URETEROSCOPY/HOLMIUM LASER/STENT PLACEMENT;  Surgeon: Ardis Hughs, MD;  Location: [facility];  Service: Urology;  Laterality: Right;  . HEMATOMA EVACUATION  1990s   right calf  . INGUINAL HERNIA REPAIR Bilateral age 5s  . PLANTAR FASCIA SURGERY Left 2004  . TONSILLECTOMY  child  . TRANSTHORACIC ECHOCARDIOGRAM  10/24/2017   ef 60-65%,  grade 1 diastolic dysfunction   Current Outpatient Medications on File Prior to Visit  Medication Sig Dispense Refill  . albuterol (VENTOLIN HFA) 108 (90 Base) MCG/ACT inhaler Inhale 2 puffs into the lungs 3 (three) times daily. 8 g 1  . ALPRAZolam (XANAX) 0.5 MG tablet Take 1 tablet (0.5 mg total) by mouth 3 (three) times daily as needed. 30 tablet 0  . ascorbic acid (VITAMIN C) 500 MG tablet Take 1 tablet (500 mg total) by mouth daily. 30 tablet 1  . atorvastatin (LIPITOR) 20 MG tablet Take 1 tablet (20 mg total) by mouth daily. 90 tablet 1  . furosemide (LASIX) 40 MG tablet Take 1 tablet (40 mg total) by mouth 2 (two) times daily. 60 tablet 1  . guaiFENesin-dextromethorphan (ROBITUSSIN DM) 100-10 MG/5ML syrup Take 10 mLs by mouth every 4 (four) hours as needed for cough. 118 mL 0  . metoprolol succinate (TOPROL-XL) 25 MG 24 hr tablet Take 0.5 tablets (12.5 mg total) by mouth daily. 60 tablet 2  . Multiple Vitamin (MULTIVITAMIN WITH MINERALS) TABS tablet Take 1 tablet by mouth daily. 30 tablet 1  . OXYGEN Inhale 6 L into the lungs. Continous    . sodium chloride (OCEAN) 0.65 % SOLN nasal spray Place 1 spray into both nostrils as needed for congestion. 60 mL 1  . zinc sulfate 220 (50 Zn) MG capsule Take 1 capsule (220 mg total) by mouth daily. 30 capsule 1   No current facility-administered medications on file prior to visit.   Allergies  Allergen Reactions  . Sulfa Antibiotics Hives and Shortness Of Breath  .  Crestor [Rosuvastatin Calcium] Other (See Comments)    Leg cramps  . Lipitor [Atorvastatin Calcium] Other (See Comments)    Leg cramps  . Tetanus Toxoids Other (See Comments)    Unknown reaction   Social History   Socioeconomic History  . Marital status: Single    Spouse name: Not on file  . Number of children: Not on file  . Years of education: Not on file  . Highest education level: Not on file  Occupational History  . Not on file  Tobacco Use  . Smoking status: Former Smoker    Years: 20.00    Types: Cigarettes    Quit date: 05/04/2006    Years since quitting: 13.7  . Smokeless tobacco: Former Systems developer    Types: [place] date: 05/04/2017  Substance and Sexual Activity  . Alcohol use: No  . Drug use: No  . Sexual activity: Yes  Other Topics Concern  . Not on file  Social History Narrative   Married.   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   Social Determinants of Health   Financial  Resource Strain:   . Difficulty of Paying Living Expenses:   Food Insecurity:   . Worried About Charity fundraiser in the Last Year:   . Arboriculturist in the Last Year:   Transportation Needs:   . Film/video editor (Medical):   Marland Kitchen Lack of Transportation (Non-Medical):   Physical Activity:   . Days of Exercise per Week:   . Minutes of Exercise per Session:   Stress:   . Feeling of Stress :   Social Connections:   . Frequency of Communication with Friends and Family:   . Frequency of Social Gatherings with Friends and Family:   . Attends Religious Services:   . Active Member of Clubs or Organizations:   . Attends Archivist Meetings:   Marland Kitchen Marital Status:   Intimate Partner Violence:   . Fear of Current or Ex-Partner:   . Emotionally Abused:   Marland Kitchen Physically Abused:   . Sexually Abused:    Family History  Problem Relation Age of Onset  . Thyroid cancer Mother   . Heart disease Father        MI  . Colon cancer Father   . Melanoma Father   . Benign prostatic hyperplasia Brother   .  Diabetes Mellitus II Maternal Grandmother   . Ovarian cancer Daughter     Review of Systems  All other systems reviewed and are negative.      Objective:   Physical Exam Vitals reviewed.  Constitutional:      General: He is not in acute distress.    Appearance: Normal appearance. He is normal weight. He is not ill-appearing, toxic-appearing or diaphoretic.  HENT:     Head: Normocephalic and atraumatic.     Right Ear: Tympanic membrane, ear canal and external ear normal.     Left Ear: Tympanic membrane, ear canal and external ear normal.     Nose: Nose normal. No congestion or rhinorrhea.     Mouth/Throat:     Mouth: Mucous membranes are moist.     Pharynx: No oropharyngeal exudate or posterior oropharyngeal erythema.  Eyes:     General: No scleral icterus.       Right eye: No discharge.        Left eye: No discharge.     Extraocular Movements: Extraocular movements intact.     Conjunctiva/sclera: Conjunctivae normal.     Pupils: Pupils are equal, round, and reactive to light.  Neck:     Vascular: No carotid bruit.  Cardiovascular:     Rate and Rhythm: Normal rate and regular rhythm.     Pulses: Normal pulses.     Heart sounds: Normal heart sounds. No murmur. No friction rub. No gallop.   Pulmonary:     Effort: Pulmonary effort is normal. No respiratory distress.     Breath sounds: No stridor. Examination of the right-middle field reveals rales. Examination of the right-lower field reveals rales. Rales present. No wheezing or rhonchi.  Chest:     Chest wall: No tenderness.  Abdominal:     General: Abdomen is flat. Bowel sounds are normal. There is no distension.     Palpations: Abdomen is soft. There is no mass.     Tenderness: There is no abdominal tenderness. There is no right CVA tenderness, left CVA tenderness, guarding or rebound.     Hernia: No hernia is present.  Musculoskeletal:        General: No swelling or tenderness. Normal range of  motion.     Cervical  back: Normal range of motion and neck supple. No rigidity. No muscular tenderness.     Right lower leg: No edema.     Left lower leg: No edema.  Lymphadenopathy:     Cervical: No cervical adenopathy.  Skin:    General: Skin is warm.     Coloration: Skin is not jaundiced or pale.     Findings: No bruising, erythema, lesion or rash.  Neurological:     General: No focal deficit present.     Mental Status: He is alert and oriented to person, place, and time. Mental status is at baseline.     Cranial Nerves: No cranial nerve deficit.     Motor: No weakness.     Coordination: Coordination normal.     Gait: Gait normal.     Deep Tendon Reflexes: Reflexes normal.  Psychiatric:        Mood and Affect: Mood normal.        Behavior: Behavior normal.        Thought Content: Thought content normal.        Judgment: Judgment normal.   Patient declines digital rectal exam        Assessment & Plan:  Pneumonia due to COVID-19 virus - Plan: CBC with Differential/Platelet, COMPLETE METABOLIC PANEL WITH GFR  Prediabetes - Plan: CBC with Differential/Platelet, COMPLETE METABOLIC PANEL WITH GFR  Chronic respiratory failure with hypoxia (HCC) - Plan: CBC with Differential/Platelet, COMPLETE METABOLIC PANEL WITH GFR  Patient appears to be improving since discharge from hospital.  He will continue oxygen via nasal cannula at 3 L per tickly with activity and while sleeping.  I will recheck the patient in 2 weeks and at that time I suspect that he will likely be able to discontinue oxygen altogether.  I will check a CBC today to monitor for any anemia and I will also check a CMP to monitor his renal function and blood sugar.  Patient is currently taking 40 mg of Lasix twice daily.  There is no pitting edema on exam and no evidence of fluid overload.  As long as lab work is normal, I will likely decrease his Lasix to 40 mg once a day.  I believe he can discontinue his fluid restriction at this time as he  does not appear fluid overloaded.  Spent more than 30 minutes today with the patient and his family answering her questions.  Recheck here in 2 weeks.  Recommended postponing for his dental surgery until he is off oxygen.

## 2020-01-24 ENCOUNTER — Other Ambulatory Visit: Payer: Self-pay | Admitting: Internal Medicine

## 2020-01-24 DIAGNOSIS — J1282 Pneumonia due to coronavirus disease 2019: Secondary | ICD-10-CM

## 2020-01-24 DIAGNOSIS — U071 COVID-19: Secondary | ICD-10-CM

## 2020-01-27 DIAGNOSIS — J9601 Acute respiratory failure with hypoxia: Secondary | ICD-10-CM | POA: Diagnosis not present

## 2020-01-27 DIAGNOSIS — N184 Chronic kidney disease, stage 4 (severe): Secondary | ICD-10-CM | POA: Diagnosis not present

## 2020-01-27 DIAGNOSIS — N179 Acute kidney failure, unspecified: Secondary | ICD-10-CM | POA: Diagnosis not present

## 2020-01-27 DIAGNOSIS — J1282 Pneumonia due to coronavirus disease 2019: Secondary | ICD-10-CM | POA: Diagnosis not present

## 2020-01-27 DIAGNOSIS — I129 Hypertensive chronic kidney disease with stage 1 through stage 4 chronic kidney disease, or unspecified chronic kidney disease: Secondary | ICD-10-CM | POA: Diagnosis not present

## 2020-01-27 DIAGNOSIS — U071 COVID-19: Secondary | ICD-10-CM | POA: Diagnosis not present

## 2020-01-29 ENCOUNTER — Other Ambulatory Visit: Payer: Self-pay | Admitting: Family Medicine

## 2020-01-29 DIAGNOSIS — R799 Abnormal finding of blood chemistry, unspecified: Secondary | ICD-10-CM

## 2020-01-29 DIAGNOSIS — R945 Abnormal results of liver function studies: Secondary | ICD-10-CM

## 2020-01-29 DIAGNOSIS — R7989 Other specified abnormal findings of blood chemistry: Secondary | ICD-10-CM

## 2020-01-30 DIAGNOSIS — N184 Chronic kidney disease, stage 4 (severe): Secondary | ICD-10-CM | POA: Diagnosis not present

## 2020-01-30 DIAGNOSIS — U071 COVID-19: Secondary | ICD-10-CM | POA: Diagnosis not present

## 2020-01-30 DIAGNOSIS — I129 Hypertensive chronic kidney disease with stage 1 through stage 4 chronic kidney disease, or unspecified chronic kidney disease: Secondary | ICD-10-CM | POA: Diagnosis not present

## 2020-01-30 DIAGNOSIS — J9601 Acute respiratory failure with hypoxia: Secondary | ICD-10-CM | POA: Diagnosis not present

## 2020-01-30 DIAGNOSIS — J1282 Pneumonia due to coronavirus disease 2019: Secondary | ICD-10-CM | POA: Diagnosis not present

## 2020-01-30 DIAGNOSIS — N179 Acute kidney failure, unspecified: Secondary | ICD-10-CM | POA: Diagnosis not present

## 2020-02-03 DIAGNOSIS — N184 Chronic kidney disease, stage 4 (severe): Secondary | ICD-10-CM | POA: Diagnosis not present

## 2020-02-03 DIAGNOSIS — U071 COVID-19: Secondary | ICD-10-CM | POA: Diagnosis not present

## 2020-02-03 DIAGNOSIS — J9601 Acute respiratory failure with hypoxia: Secondary | ICD-10-CM | POA: Diagnosis not present

## 2020-02-03 DIAGNOSIS — N179 Acute kidney failure, unspecified: Secondary | ICD-10-CM | POA: Diagnosis not present

## 2020-02-03 DIAGNOSIS — J1282 Pneumonia due to coronavirus disease 2019: Secondary | ICD-10-CM | POA: Diagnosis not present

## 2020-02-03 DIAGNOSIS — I129 Hypertensive chronic kidney disease with stage 1 through stage 4 chronic kidney disease, or unspecified chronic kidney disease: Secondary | ICD-10-CM | POA: Diagnosis not present

## 2020-02-05 DIAGNOSIS — I129 Hypertensive chronic kidney disease with stage 1 through stage 4 chronic kidney disease, or unspecified chronic kidney disease: Secondary | ICD-10-CM | POA: Diagnosis not present

## 2020-02-05 DIAGNOSIS — N184 Chronic kidney disease, stage 4 (severe): Secondary | ICD-10-CM | POA: Diagnosis not present

## 2020-02-05 DIAGNOSIS — U071 COVID-19: Secondary | ICD-10-CM | POA: Diagnosis not present

## 2020-02-05 DIAGNOSIS — N179 Acute kidney failure, unspecified: Secondary | ICD-10-CM | POA: Diagnosis not present

## 2020-02-05 DIAGNOSIS — J9601 Acute respiratory failure with hypoxia: Secondary | ICD-10-CM | POA: Diagnosis not present

## 2020-02-05 DIAGNOSIS — J1282 Pneumonia due to coronavirus disease 2019: Secondary | ICD-10-CM | POA: Diagnosis not present

## 2020-02-07 ENCOUNTER — Other Ambulatory Visit: Payer: Self-pay

## 2020-02-07 ENCOUNTER — Other Ambulatory Visit: Payer: Medicare Other

## 2020-02-07 DIAGNOSIS — R945 Abnormal results of liver function studies: Secondary | ICD-10-CM | POA: Diagnosis not present

## 2020-02-07 DIAGNOSIS — R799 Abnormal finding of blood chemistry, unspecified: Secondary | ICD-10-CM

## 2020-02-07 DIAGNOSIS — R7989 Other specified abnormal findings of blood chemistry: Secondary | ICD-10-CM

## 2020-02-08 LAB — COMPREHENSIVE METABOLIC PANEL
AG Ratio: 1.6 (calc) (ref 1.0–2.5)
ALT: 57 U/L — ABNORMAL HIGH (ref 9–46)
AST: 34 U/L (ref 10–35)
Albumin: 4.1 g/dL (ref 3.6–5.1)
Alkaline phosphatase (APISO): 87 U/L (ref 35–144)
BUN/Creatinine Ratio: 13 (calc) (ref 6–22)
BUN: 27 mg/dL — ABNORMAL HIGH (ref 7–25)
CO2: 26 mmol/L (ref 20–32)
Calcium: 9.9 mg/dL (ref 8.6–10.3)
Chloride: 94 mmol/L — ABNORMAL LOW (ref 98–110)
Creat: 2.02 mg/dL — ABNORMAL HIGH (ref 0.70–1.18)
Globulin: 2.6 g/dL (calc) (ref 1.9–3.7)
Glucose, Bld: 273 mg/dL — ABNORMAL HIGH (ref 65–99)
Potassium: 3.1 mmol/L — ABNORMAL LOW (ref 3.5–5.3)
Sodium: 137 mmol/L (ref 135–146)
Total Bilirubin: 0.8 mg/dL (ref 0.2–1.2)
Total Protein: 6.7 g/dL (ref 6.1–8.1)

## 2020-02-08 LAB — CBC WITH DIFFERENTIAL/PLATELET
Absolute Monocytes: 589 cells/uL (ref 200–950)
Basophils Absolute: 51 cells/uL (ref 0–200)
Basophils Relative: 0.8 %
Eosinophils Absolute: 51 cells/uL (ref 15–500)
Eosinophils Relative: 0.8 %
HCT: 40.1 % (ref 38.5–50.0)
Hemoglobin: 13.5 g/dL (ref 13.2–17.1)
Lymphs Abs: 768 cells/uL — ABNORMAL LOW (ref 850–3900)
MCH: 28.7 pg (ref 27.0–33.0)
MCHC: 33.7 g/dL (ref 32.0–36.0)
MCV: 85.1 fL (ref 80.0–100.0)
MPV: 10.9 fL (ref 7.5–12.5)
Monocytes Relative: 9.2 %
Neutro Abs: 4941 cells/uL (ref 1500–7800)
Neutrophils Relative %: 77.2 %
Platelets: 202 10*3/uL (ref 140–400)
RBC: 4.71 10*6/uL (ref 4.20–5.80)
RDW: 15.8 % — ABNORMAL HIGH (ref 11.0–15.0)
Total Lymphocyte: 12 %
WBC: 6.4 10*3/uL (ref 3.8–10.8)

## 2020-02-10 ENCOUNTER — Other Ambulatory Visit: Payer: Self-pay

## 2020-02-10 ENCOUNTER — Encounter: Payer: Self-pay | Admitting: Family Medicine

## 2020-02-10 ENCOUNTER — Ambulatory Visit (INDEPENDENT_AMBULATORY_CARE_PROVIDER_SITE_OTHER): Payer: Medicare Other | Admitting: Family Medicine

## 2020-02-10 VITALS — BP 100/70 | HR 96 | Temp 97.2°F | Resp 18 | Ht 66.0 in | Wt 195.0 lb

## 2020-02-10 DIAGNOSIS — J1282 Pneumonia due to coronavirus disease 2019: Secondary | ICD-10-CM

## 2020-02-10 DIAGNOSIS — U071 COVID-19: Secondary | ICD-10-CM

## 2020-02-10 DIAGNOSIS — R7303 Prediabetes: Secondary | ICD-10-CM | POA: Diagnosis not present

## 2020-02-10 LAB — GLUCOSE 16585: Glucose: 143 mg/dL — ABNORMAL HIGH (ref 65–99)

## 2020-02-10 MED ORDER — ACCU-CHEK SOFTCLIX LANCETS MISC: 12 refills | Status: AC

## 2020-02-10 MED ORDER — ACCU-CHEK SOFTCLIX LANCET DEV KIT: PACK | 0 refills | Status: AC

## 2020-02-10 MED ORDER — POTASSIUM CHLORIDE ER 10 MEQ PO TBCR: 10.0000 meq | EXTENDED_RELEASE_TABLET | Freq: Every day | ORAL | 1 refills | Status: DC

## 2020-02-10 MED ORDER — ACCU-CHEK GUIDE W/DEVICE KIT: 1.0000 [IU] | PACK | Freq: Two times a day (BID) | 0 refills | Status: AC

## 2020-02-10 MED ORDER — ACCU-CHEK GUIDE VI STRP: ORAL_STRIP | 3 refills | Status: AC

## 2020-02-10 NOTE — Progress Notes (Signed)
Subjective:    Patient ID: Mike Davila, male    DOB: 1946-05-10, 74 y.o.   MRN: [id number]  HPI  Unfortunately, the patient was recently admitted to the hospital with COVID-19.  He subsequently developed pneumonia related to this and oxygen dependent respiratory failure.  Patient states at 1 point he was on 15 L however he was recently discharged from the hospital on 6 L via nasal cannula.  I have copied relevant portions of his discharge summary below and included them for my reference.  Admit date: 01/02/2020 Discharge date: 01/14/2020  Time spent: 35 minutes  Recommendations for Outpatient Follow-up:  1. Repeat basic metabolic panel to follow across renal function 2. Reassess blood pressure and further adjust antihypertensive treatment as needed 3. Close monitoring to patient CBGs after being on steroids with further determination to initiate hypoglycemic treatment if required. 4. Continue weaning oxygen supplementation of as tolerated repeat chest x-ray in 4-6 weeks to assure complete resolution of infiltrates.   Discharge Diagnoses:  Principal Problem:   Pneumonia due to COVID-19 virus Active Problems:   Hypertension   Acute renal failure superimposed on stage 4 chronic kidney disease (HCC)   CKD (chronic kidney disease), stage III   Prediabetes   Acute respiratory failure with hypoxia (HCC)   Class 1 obesity due to excess calories with body mass index (BMI) of 33.0 to 33.9 in adult   Discharge Condition: Stable and improved.  Patient discharged home with instruction to follow-up with PCP in 2 weeks.  Home health PT and oxygen supplementation arranged at time of discharge.  CODE STATUS: Full code.  Diet recommendation: Heart healthy diet and modified carbohydrate diet.       Filed Weights   01/12/20 0420 01/13/20 0523 01/14/20 0542  Weight: 90.1 kg 86.1 kg 85.1 kg    History of present illness:  As per H&P written by Dr. Denton Brick on 01/02/2020 73  y.o.malewith medical history significant forCKD 3, hypertension,prediabetes. Patient presented to the ED with complaints of 1 week of difficulty breathing, and cough. He also reports some mild diarrhea. Patient spouse tested positive for COVID-19 infection March 1,she was sick but did not have any difficulty breathing,she did not require hospitalization.Patient has not been tested. He tells me he has not been vaccinated because he has not been able to get it/have access to it. He denies chest pain.  ED Course:O2 sats 87-89% on room air improved with 2 L nasal cannula to 95%. Temperature 100. Tachypneic. WBC 3.7. Normal lactic acid 3.9. Elevated creatinine inflammatory markers. Creatinine 2.65, at baseline. Possible chest x-ray consistent with COVID-19 infection,showing extensive bilateral heterogeneous airspace opacities consistent with multifocal infection.POCCOVID-19 test negative. Covid PCR test pending. Also elevated procalcitonin at 0.3. Hospitalist admit for further evaluation and management.  Hospital Course:   Acute hypoxemic respiratory failure secondary to COVID-19 pneumonia-persistent -Actemra administered on 3/14 -Repeated chest x-ray stableon 3/16, demonstrating no significant interval change in bilateral airspace disease. Finding consistent with Covid pneumonia given provided history. -Continue dexamethasone for 5 days at discharge. -Patient has completed 5 days of remdesivir while inpatient.. -Continue stepdown unit monitoringfor now and appreciateongoing pulmonology and nephrology consultation/assistance. -Currently on5L high flow nasal cannulaat rest.supplementation; oxygen saturation goal is for 88-92%. -Patient has been instructed to use incentive spirometer and flutter valve -Continue bronchodilators and weaning O2 off process over time as tolerated. -Outpatient follow-up with pulmonary service on as-needed  basis.  Hypertension-stable -Continue home metoprololand Lasix at adjusted dose. -Vital signs stable. -Heart healthy diet  has been recommended.  AKI onCKD stage4-improved -Baseline creatinine 2.2-2.6 -Currentlyat 2.5,appreciate nephrology,patient has diuresed well after Lasix IV given. -continue lasix 40mg  BID now -no need for HD -Continue to to follow daily weights and Continue low-sodium diet.  Prediabetes-with steroid-induced hyperglycemia -Monitor carefully while on steroids -SSI used inside hospital -A1c 6.1% -Modified carbohydrate diet has been encouraged at time of discharge.  BPH -Continue home alfuzosin -No symptoms of urinary retention reported at time of discharge.  Anxiety -Patient appears to be more relaxed. -As needed Xanax was used for hospitalized; patient can be reassessed by his PCP as an outpatient and further anxiolytic therapy to be decided.  Class I obesity -Body mass index is 33.23 kg/m. -Low calorie diet, portion control and increase physical activity discussed with patient.  Physical deconditioning -Home health PT has been recommended and arranged prior to discharge.  Procedures:  See below for x-ray reports  Consultations:  Nephrology service  Pulmonologist.  01/23/20 Patient is here today with his family for follow-up.  He is currently wearing 6 L however he admits that he takes it off sometimes when he is resting.  I took his oxygen off and while he was sitting still, we had a conversation.  His oxygen dropped to 92 to 93% on room air at rest.  However with ambulation off oxygen, his oxygen level quickly plummeted to 86 on room air.  Ultimately the patient required 3 L of oxygen via nasal cannula to maintain oxygen saturations greater than 90% with activity.  Therefore I recommended that the patient maintain oxygen 3 L via nasal cannula with activity and at night while he sleeps.  However he does appear to be improving.  He denies  any chest pain shortness of breath or dyspnea on exertion.  He states that he cannot really tell that he needs the oxygen and is eager to get rid of it.  He is eating better now.  He denies any nausea or vomiting.  He denies any diarrhea.  He denies any dysuria.  He denies any blood in his stool.  He has completed his steroids.  At that time, my plan was: Patient appears to be improving since discharge from hospital.  He will continue oxygen via nasal cannula at 3 L with activity and while sleeping.  I will recheck the patient in 2 weeks and at that time I suspect that he will likely be able to discontinue oxygen altogether.  I will check a CBC today to monitor for any anemia and I will also check a CMP to monitor his renal function and blood sugar.  Patient is currently taking 40 mg of Lasix twice daily.  There is no pitting edema on exam and no evidence of fluid overload.  As long as lab work is normal, I will likely decrease his Lasix to 40 mg once a day.  I believe he can discontinue his fluid restriction at this time as he does not appear fluid overloaded.  Spent more than 30 minutes today with the patient and his family answering her questions.  Recheck here in 2 weeks.  Recommended postponing for his dental surgery until he is off oxygen.  02/10/20 Recently had labs (4/16) Appointment on 02/07/2020  Component Date Value Ref Range Status  . Glucose, Bld 02/07/2020 273* 65 - 99 mg/dL Final   Comment: .            Fasting reference interval . For someone without known diabetes, a glucose value >125 mg/dL indicates  that they may have diabetes and this should be confirmed with a follow-up test. .   . BUN 02/07/2020 27* 7 - 25 mg/dL Final  . Creat 02/07/2020 2.02* 0.70 - 1.18 mg/dL Final   Comment: For patients >39 years of age, the reference limit for Creatinine is approximately 13% higher for people identified as African-American. .   Havery Moros Ratio 02/07/2020 13  6 - 22 (calc)  Final  . Sodium 02/07/2020 137  135 - 146 mmol/L Final  . Potassium 02/07/2020 3.1* 3.5 - 5.3 mmol/L Final  . Chloride 02/07/2020 94* 98 - 110 mmol/L Final  . CO2 02/07/2020 26  20 - 32 mmol/L Final  . Calcium 02/07/2020 9.9  8.6 - 10.3 mg/dL Final  . Total Protein 02/07/2020 6.7  6.1 - 8.1 g/dL Final  . Albumin 02/07/2020 4.1  3.6 - 5.1 g/dL Final  . Globulin 02/07/2020 2.6  1.9 - 3.7 g/dL (calc) Final  . AG Ratio 02/07/2020 1.6  1.0 - 2.5 (calc) Final  . Total Bilirubin 02/07/2020 0.8  0.2 - 1.2 mg/dL Final  . Alkaline phosphatase (APISO) 02/07/2020 87  35 - 144 U/L Final  . AST 02/07/2020 34  10 - 35 U/L Final  . ALT 02/07/2020 57* 9 - 46 U/L Final  . WBC 02/07/2020 6.4  3.8 - 10.8 Thousand/uL Final  . RBC 02/07/2020 4.71  4.20 - 5.80 Million/uL Final  . Hemoglobin 02/07/2020 13.5  13.2 - 17.1 g/dL Final  . HCT 02/07/2020 40.1  38.5 - 50.0 % Final  . MCV 02/07/2020 85.1  80.0 - 100.0 fL Final  . MCH 02/07/2020 28.7  27.0 - 33.0 pg Final  . MCHC 02/07/2020 33.7  32.0 - 36.0 g/dL Final  . RDW 02/07/2020 15.8* 11.0 - 15.0 % Final  . Platelets 02/07/2020 202  140 - 400 Thousand/uL Final  . MPV 02/07/2020 10.9  7.5 - 12.5 fL Final  . Neutro Abs 02/07/2020 4,941  1,500 - 7,800 cells/uL Final  . Lymphs Abs 02/07/2020 768* 850 - 3,900 cells/uL Final  . Absolute Monocytes 02/07/2020 589  200 - 950 cells/uL Final  . Eosinophils Absolute 02/07/2020 51  15 - 500 cells/uL Final  . Basophils Absolute 02/07/2020 51  0 - 200 cells/uL Final  . Neutrophils Relative % 02/07/2020 77.2  % Final  . Total Lymphocyte 02/07/2020 12.0  % Final  . Monocytes Relative 02/07/2020 9.2  % Final  . Eosinophils Relative 02/07/2020 0.8  % Final  . Basophils Relative 02/07/2020 0.8  % Final   Blood sugar 273 despite being off steroids and potassium remains low.  Liver tests have improved.  Renal fcn is stable.  I did perform a finger check blood sugar today.  He is eating 3 hours ago.  His blood sugar was over 140  but was nowhere near the 273 from Friday.  This is much more reassuring.  Patient denies any polyuria polydipsia or blurry vision.  He continues to take his fluid pill twice a day however there is no pitting edema in his extremities.  He denies any cough or orthopnea.  His oxygen is 96% on room air sitting however with activity it drops to 88% after walking 60 feet.  His breathing is better but certainly not back to his baseline. Past Medical History:  Diagnosis Date  . Anemia   . Benign localized prostatic hyperplasia with lower urinary tract symptoms (LUTS)   . CKD (chronic kidney disease), stage III    nephrologist-  dr Hinda Lenis ([facility] in [place])-- per pt last visit 06/ 2019  . Diverticulosis of colon   . ED (erectile dysfunction)   . History of acute gouty arthritis 10/23/2017  . History of acute renal failure 10/23/2017   hospital admission-- w/ acute tubular necrosis superimposed on ckd 3--- resolved  . History of diverticulitis of colon 10/05/2017   admission-- mild-- resolved w/ medical management  . History of metabolic acidosis 03/47/4259   admission-- resolved  . History of palpitations    approx. 2009, per pt no issue since  . History of urinary retention 10/2017  . Hypertension   . Incomplete right bundle branch block   . Mixed hyperlipidemia   . Nephrolithiasis    left side nonobstructive  . Pre-diabetes   . Right ureteral stone   . Wears dentures    upper    We discussed his history of acute tubular necrosis.  He still occasionally takes Advil for low back pain.  He does this occasionally.  I explained to the patient that NSAIDs are toxic to the kidney potentially and that he should avoid this.  He does have a history of prediabetes and he is due to recheck his blood sugar and a hemoglobin A1c. Past Surgical History:  Procedure Laterality Date  . ANKLE SURGERY Bilateral 1990s to early 2000s   removal bone spurs  . APPENDECTOMY  child  . COLONOSCOPY N/A 10/27/2017    Procedure: COLONOSCOPY;  Surgeon: Rogene Houston, MD;  Location: [facility];  Service: Endoscopy;  Laterality: N/A;  . CYSTOSCOPY/URETEROSCOPY/HOLMIUM LASER/STENT PLACEMENT Right 05/09/2018   Procedure: CYSTOSCOPY RIGHT URETEROSCOPY/HOLMIUM LASER/STENT PLACEMENT;  Surgeon: Ardis Hughs, MD;  Location: [facility];  Service: Urology;  Laterality: Right;  . HEMATOMA EVACUATION  1990s   right calf  . INGUINAL HERNIA REPAIR Bilateral age 70s  . PLANTAR FASCIA SURGERY Left 2004  . TONSILLECTOMY  child  . TRANSTHORACIC ECHOCARDIOGRAM  10/24/2017   ef 60-65%,  grade 1 diastolic dysfunction   Current Outpatient Medications on File Prior to Visit  Medication Sig Dispense Refill  . albuterol (VENTOLIN HFA) 108 (90 Base) MCG/ACT inhaler Inhale 2 puffs into the lungs 3 (three) times daily. 8 g 1  . alfuzosin (UROXATRAL) 10 MG 24 hr tablet Take 1 tablet (10 mg total) by mouth daily with breakfast. 90 tablet 3  . ALPRAZolam (XANAX) 0.5 MG tablet Take 1 tablet (0.5 mg total) by mouth 3 (three) times daily as needed. 30 tablet 0  . ascorbic acid (VITAMIN C) 500 MG tablet Take 1 tablet (500 mg total) by mouth daily. 30 tablet 1  . atorvastatin (LIPITOR) 20 MG tablet Take 1 tablet (20 mg total) by mouth daily. 90 tablet 1  . furosemide (LASIX) 40 MG tablet Take 1 tablet (40 mg total) by mouth 2 (two) times daily. 60 tablet 1  . guaiFENesin-dextromethorphan (ROBITUSSIN DM) 100-10 MG/5ML syrup Take 10 mLs by mouth every 4 (four) hours as needed for cough. 118 mL 0  . metoprolol succinate (TOPROL-XL) 25 MG 24 hr tablet Take 0.5 tablets (12.5 mg total) by mouth daily. 60 tablet 2  . Multiple Vitamin (MULTIVITAMIN WITH MINERALS) TABS tablet Take 1 tablet by mouth daily. 30 tablet 1  . OXYGEN Inhale 6 L into the lungs. Continous    . sodium chloride (OCEAN) 0.65 % SOLN nasal spray Place 1 spray into both nostrils as needed for congestion. 60 mL 1  . zinc sulfate 220 (50 Zn) MG capsule  Take  1 capsule (220 mg total) by mouth daily. 30 capsule 1   No current facility-administered medications on file prior to visit.   Allergies  Allergen Reactions  . Sulfa Antibiotics Hives and Shortness Of Breath  . Crestor [Rosuvastatin Calcium] Other (See Comments)    Leg cramps  . Lipitor [Atorvastatin Calcium] Other (See Comments)    Leg cramps  . Tetanus Toxoids Other (See Comments)    Unknown reaction   Social History   Socioeconomic History  . Marital status: Single    Spouse name: Not on file  . Number of children: Not on file  . Years of education: Not on file  . Highest education level: Not on file  Occupational History  . Not on file  Tobacco Use  . Smoking status: Former Smoker    Years: 20.00    Types: Cigarettes    Quit date: 05/04/2006    Years since quitting: 13.7  . Smokeless tobacco: Former Systems developer    Types: [employer] date: 05/04/2017  Substance and Sexual Activity  . Alcohol use: No  . Drug use: No  . Sexual activity: Yes  Other Topics Concern  . Not on file  Social History Narrative   Married.   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   Social Determinants of Health   Financial Resource Strain:   . Difficulty of Paying Living Expenses:   Food Insecurity:   . Worried About Charity fundraiser in the Last Year:   . Arboriculturist in the Last Year:   Transportation Needs:   . Film/video editor (Medical):   Marland Kitchen Lack of Transportation (Non-Medical):   Physical Activity:   . Days of Exercise per Week:   . Minutes of Exercise per Session:   Stress:   . Feeling of Stress :   Social Connections:   . Frequency of Communication with Friends and Family:   . Frequency of Social Gatherings with Friends and Family:   . Attends Religious Services:   . Active Member of Clubs or Organizations:   . Attends Archivist Meetings:   Marland Kitchen Marital Status:   Intimate Partner Violence:   . Fear of Current or Ex-Partner:   . Emotionally Abused:   Marland Kitchen Physically Abused:   .  Sexually Abused:    Family History  Problem Relation Age of Onset  . Thyroid cancer Mother   . Heart disease Father        MI  . Colon cancer Father   . Melanoma Father   . Benign prostatic hyperplasia Brother   . Diabetes Mellitus II Maternal Grandmother   . Ovarian cancer Daughter     Review of Systems  All other systems reviewed and are negative.      Objective:   Physical Exam Vitals reviewed.  Constitutional:      General: He is not in acute distress.    Appearance: Normal appearance. He is normal weight. He is not ill-appearing, toxic-appearing or diaphoretic.  HENT:     Head: Normocephalic and atraumatic.     Right Ear: Tympanic membrane, ear canal and external ear normal.     Left Ear: Tympanic membrane, ear canal and external ear normal.     Nose: Nose normal. No congestion or rhinorrhea.     Mouth/Throat:     Mouth: Mucous membranes are moist.     Pharynx: No oropharyngeal exudate or posterior oropharyngeal erythema.  Eyes:     General: No scleral icterus.  Right eye: No discharge.        Left eye: No discharge.     Extraocular Movements: Extraocular movements intact.     Conjunctiva/sclera: Conjunctivae normal.     Pupils: Pupils are equal, round, and reactive to light.  Neck:     Vascular: No carotid bruit.  Cardiovascular:     Rate and Rhythm: Normal rate and regular rhythm.     Pulses: Normal pulses.     Heart sounds: Normal heart sounds. No murmur. No friction rub. No gallop.   Pulmonary:     Effort: Pulmonary effort is normal. No respiratory distress.     Breath sounds: No stridor. Examination of the right-middle field reveals rales. Examination of the right-lower field reveals rales. Rales present. No wheezing or rhonchi.  Chest:     Chest wall: No tenderness.  Abdominal:     General: Abdomen is flat. Bowel sounds are normal. There is no distension.     Palpations: Abdomen is soft. There is no mass.     Tenderness: There is no abdominal  tenderness. There is no right CVA tenderness, left CVA tenderness, guarding or rebound.     Hernia: No hernia is present.  Musculoskeletal:        General: No swelling or tenderness. Normal range of motion.     Cervical back: Normal range of motion and neck supple. No rigidity. No muscular tenderness.     Right lower leg: No edema.     Left lower leg: No edema.  Lymphadenopathy:     Cervical: No cervical adenopathy.  Skin:    General: Skin is warm.     Coloration: Skin is not jaundiced or pale.     Findings: No bruising, erythema, lesion or rash.  Neurological:     General: No focal deficit present.     Mental Status: He is alert and oriented to person, place, and time. Mental status is at baseline.     Cranial Nerves: No cranial nerve deficit.     Motor: No weakness.     Coordination: Coordination normal.     Gait: Gait normal.     Deep Tendon Reflexes: Reflexes normal.  Psychiatric:        Mood and Affect: Mood normal.        Behavior: Behavior normal.        Thought Content: Thought content normal.        Judgment: Judgment normal.         Assessment & Plan:  Prediabetes - Plan: Glucose, fingerstick (stat)  Pneumonia due to COVID-19 virus  Breathing has improved but not yet back to baseline.  Continue oxygen 3 L with activity and recheck in 2 to 3 weeks.  Begin K-Lor 10 mEq a day for hypokalemia and reduce Lasix to 40 mg once a day to avoid dehydration.  He was previously on 40 mg once a day and was just recently increased to twice a day in the hospital.  Patient will check his fasting blood sugar and his 2-hour postprandial sugars for the next week and notify me on Friday of the values.  I am hoping that the 273 was an isolated fluke however the patient's prediabetes may have progressed to overt diabetes given his recent physical stress.  Await results of his labs and home sugar monitoring.

## 2020-02-13 DIAGNOSIS — J9601 Acute respiratory failure with hypoxia: Secondary | ICD-10-CM | POA: Diagnosis not present

## 2020-02-13 DIAGNOSIS — N184 Chronic kidney disease, stage 4 (severe): Secondary | ICD-10-CM | POA: Diagnosis not present

## 2020-02-13 DIAGNOSIS — U071 COVID-19: Secondary | ICD-10-CM | POA: Diagnosis not present

## 2020-02-13 DIAGNOSIS — J1282 Pneumonia due to coronavirus disease 2019: Secondary | ICD-10-CM | POA: Diagnosis not present

## 2020-02-13 DIAGNOSIS — N179 Acute kidney failure, unspecified: Secondary | ICD-10-CM | POA: Diagnosis not present

## 2020-02-13 DIAGNOSIS — I129 Hypertensive chronic kidney disease with stage 1 through stage 4 chronic kidney disease, or unspecified chronic kidney disease: Secondary | ICD-10-CM | POA: Diagnosis not present

## 2020-02-18 ENCOUNTER — Other Ambulatory Visit: Payer: Self-pay | Admitting: Family Medicine

## 2020-02-18 DIAGNOSIS — J9611 Chronic respiratory failure with hypoxia: Secondary | ICD-10-CM

## 2020-02-18 DIAGNOSIS — N184 Chronic kidney disease, stage 4 (severe): Secondary | ICD-10-CM

## 2020-02-18 DIAGNOSIS — N179 Acute kidney failure, unspecified: Secondary | ICD-10-CM

## 2020-02-18 DIAGNOSIS — I10 Essential (primary) hypertension: Secondary | ICD-10-CM

## 2020-02-19 NOTE — Chronic Care Management (AMB) (Signed)
Chronic Care Management Pharmacy  Name: Mike Davila  MRN: [id number] DOB: Nov 03, 1945   Chief Complaint/ HPI  Mike Davila,  74 y.o. , male presents for their Initial CCM visit with the clinical pharmacist via telephone.  PCP : [name], MD  Their chronic conditions include: Hypertenstion, hyperlipidemia, pre-diabetes.  Office Visits:  02/10/20 Dennard Schaumann) - Patient admitted 3/11 for COVID-19.  Started K-clor 29mq daily and decreased furosemide to 47monce daily to avoid dehydration.  Patient told to check FBG and PPG for the next week.  Glucose was 143.  01/23/20 ([place]) - discussed limiting use of NSAIDs  Consult Visit  01/02/20 (MDyann KiefED) - admitted d/t Pneumonia associated with COVID-19.  Given SSI d/t dexamethasone use, also given xanax and pt wants Rx prescribed regularly by PCP  Medications: Outpatient Encounter Medications as of 02/20/2020  Medication Sig Note  . Accu-Chek Softclix Lancets lancets Check bs bid DX: R73.03   . albuterol (VENTOLIN HFA) 108 (90 Base) MCG/ACT inhaler Inhale 2 puffs into the lungs 3 (three) times daily.   . Marland Kitchenlfuzosin (UROXATRAL) 10 MG 24 hr tablet Take 1 tablet (10 mg total) by mouth daily with breakfast.   . ALPRAZolam (XANAX) 0.5 MG tablet Take 1 tablet (0.5 mg total) by mouth 3 (three) times daily as needed.   . Marland Kitchenscorbic acid (VITAMIN C) 500 MG tablet Take 1 tablet (500 mg total) by mouth daily.   . Marland Kitchentorvastatin (LIPITOR) 20 MG tablet Take 1 tablet (20 mg total) by mouth daily. (Patient not taking: Reported on 02/10/2020) 01/03/2020: Last filled 11/07/2018  . Blood Glucose Monitoring Suppl (ACCU-CHEK GUIDE) w/Device KIT 1 Units by Does not apply route in the morning and at bedtime.   . furosemide (LASIX) 40 MG tablet Take 1 tablet (40 mg total) by mouth 2 (two) times daily.   . Marland Kitchenlucose blood (ACCU-CHEK GUIDE) test strip Check bs bid DX: R73.03   . guaiFENesin-dextromethorphan (ROBITUSSIN DM) 100-10 MG/5ML syrup Take 10  mLs by mouth every 4 (four) hours as needed for cough.   . Lancets Misc. (ACCU-CHEK SOFTCLIX LANCET DEV) KIT Check bs bid DX: R73.03   . metoprolol succinate (TOPROL-XL) 25 MG 24 hr tablet Take 0.5 tablets (12.5 mg total) by mouth daily.   . Multiple Vitamin (MULTIVITAMIN WITH MINERALS) TABS tablet Take 1 tablet by mouth daily.   . OXYGEN Inhale 6 L into the lungs. Continous   . potassium chloride (KLOR-CON) 10 MEQ tablet Take 1 tablet (10 mEq total) by mouth daily.   . sodium chloride (OCEAN) 0.65 % SOLN nasal spray Place 1 spray into both nostrils as needed for congestion.   . Marland Kitcheninc sulfate 220 (50 Zn) MG capsule Take 1 capsule (220 mg total) by mouth daily.    No facility-administered encounter medications on file as of 02/20/2020.     Current Diagnosis/Assessment:  Goals Addressed            This Visit's Progress   . Hyperlipidemia - LDL <100       CARE PLAN ENTRY (see longitudinal plan of care for additional care plan information)  Current Barriers:  . Uncontrolled hyperlipidemia, complicated by pre-diabetes, hypertension. . Current antihyperlipidemic regimen none . Previous antihyperlipidemic medications tried: pravastatin, atorvastatin, rosuvastatin . Most recent lipid panel:     Component Value Date/Time   CHOL 209 (H) 10/30/2018 0940   TRIG 215 (H) 01/02/2020 1544   HDL 31 (L) 10/30/2018 0940   CHOLHDL 6.7 (H) 10/30/2018 0940   VLDL  71 (H) 06/15/2016 1036   LDLCALC 134 (H) 10/30/2018 0940   . ASCVD risk enhancing conditions: age >59, DM, HTN, CKD, CHF, current smoker . 10-year ASCVD risk score: 19.5%  Pharmacist Clinical Goal(s):  Marland Kitchen Over the next 30 days, patient will work with PharmD and providers towards achieving lipid goals through continuation of healthier diet.  Interventions: . Comprehensive medication review performed; medication list updated in electronic medical record.  . Continue current diet modifications.  Patient Self Care Activities:  . Over  the next 30 days patient will continue to work on diet to promote good cholesterol levels.  Initial goal documentation     . Hypertension - BP < 130/80       CARE PLAN ENTRY (see longitudinal plan of care for additional care plan information)  Current Barriers:  . Controlled hypertension, complicated by pre-diabetes, hyperlipidemia. . Current antihypertensive regimen: metoprolol 79m 1/2 tablet daily . Previous antihypertensives tried: benazepril . Last practice recorded BP readings:  BP Readings from Last 3 Encounters:  02/10/20 100/70  01/23/20 112/74  01/14/20 126/83     Pharmacist Clinical Goal(s):  .Marland KitchenOver the next 30 days, patient will work with PharmD and providers to optimize antihypertensive regimen  Interventions: . Inter-disciplinary care team collaboration (see longitudinal plan of care) . Comprehensive medication review performed; medication list updated in the electronic medical record.  . Continue current therapy.  Patient Self Care Activities:  . Patient will continue to check BP and document, and provide at future appointments . Patient will focus on medication adherence by pill count.  Initial goal documentation     . Pre-Diabetes - A1c < 5.7       CARE PLAN ENTRY (see longitudinal plan of care for additional care plan information)  Current Barriers:  . Prediabetes; complicated by chronic medical conditions including hypertension, hyperlipidemia. Lab Results  Component Value Date   HGBA1C 6.1 (H) 01/02/2020 .   Lab Results  Component Value Date   CREATININE 2.02 (H) 02/07/2020   CREATININE 2.15 (H) 01/23/2020   CREATININE 2.56 (H) 01/13/2020   . Current antihyperglycemic regimen: none . Current meal patterns: patient is starting to watch carbohydrates as discussed with Dr. PDennard Schaumannat last PCP visit. . Current exercise: not much since COVID- 19 diagnosis . Current blood glucose readings: Reported blood glucose of 115-137 two hours after a  meal. . Cardiovascular risk reduction: o Current hypertensive regimen: metoprolol succinate 214mone-half tablet daily o Current hyperlipidemia regimen: none o Current antiplatelet regimen: none  Pharmacist Clinical Goal(s):  . Marland Kitchenver the next 30 days, patient will work with PharmD and primary care provider to address historical elevated blood sugars.  Interventions: . Comprehensive medication review performed, medication list updated in electronic medical record . Inter-disciplinary care team collaboration (see longitudinal plan of care) . Continue to work on dietary changes such as watching carbohydrates.  Patient Self Care Activities:  . Patient will check blood glucose after meals , document, and provide at future appointments . Patient will focus on medication adherence by pill count . Patient will contact provider with any episodes of hypoglycemia . Patient will report any questions or concerns to provider   Initial goal documentation         Pre-diabetes   Recent Relevant Labs: Lab Results  Component Value Date/Time   HGBA1C 6.1 (H) 01/02/2020 03:43 PM   HGBA1C 5.7 (H) 10/30/2018 09:40 AM     Checking BG after meals. Recent 2hr PP BG readings:  115-137 per patient  at home readings Patient has failed these meds in past: none Patient is currently controlled on the following medications: none noted  We discussed importance of checking sugar daily.  Plan  Continue to monitor BG as directed by Dr. Dennard Schaumann and control with diet.  Hypertension   Office blood pressures are  BP Readings from Last 3 Encounters:  02/10/20 100/70  01/23/20 112/74  01/14/20 126/83    Patient has failed these meds in the past: benazepril  Patient is currently controlled on the following medications: metoprolol succinate 42m 1/2 tablet daily   Plan  Continue current medications.     Hyperlipidemia   Lipid Panel     Component Value Date/Time   CHOL 209 (H) 10/30/2018 0940    TRIG 215 (H) 01/02/2020 1544   HDL 31 (L) 10/30/2018 0940   CHOLHDL 6.7 (H) 10/30/2018 0940   VLDL 71 (H) 06/15/2016 1036   LDLCALC 134 (H) 10/30/2018 0940     The 10-year ASCVD risk score (Mikey BussingDC Jr., et al., 2013) is: 19.5%   Values used to calculate the score:     Age: [age]years     Sex: Male     Is Non-Hispanic African American: No     Diabetic: No     Tobacco smoker: No     Systolic Blood Pressure: 1185mmHg     Is BP treated: Yes     HDL Cholesterol: 31 mg/dL     Total Cholesterol: 209 mg/dL   Patient has failed these meds in past: pravastatin 20/40/857m rosuvastatin Patient is currently uncontrolled on the following medications: none noted, patient has not taken a statin in over a year.  We discussed: benefits of statins, patient has tried in the past and experienced severe muscle cramping at night so is not interested in taking them at this time.  Plan  Continue control with diet and exercise.  Patient is due for updated lipid panel.     Medication Management   . OTC's: Zinc 5060maily, Vit C 500m19mily, MVI . Patient currently uses [facility] #  [phone] MilcharmD Clinical Pharmacist [phone]

## 2020-02-20 ENCOUNTER — Other Ambulatory Visit: Payer: Self-pay

## 2020-02-20 ENCOUNTER — Ambulatory Visit: Payer: Medicare Other | Admitting: Pharmacist

## 2020-02-20 DIAGNOSIS — I10 Essential (primary) hypertension: Secondary | ICD-10-CM

## 2020-02-20 DIAGNOSIS — E785 Hyperlipidemia, unspecified: Secondary | ICD-10-CM

## 2020-02-20 DIAGNOSIS — R7303 Prediabetes: Secondary | ICD-10-CM

## 2020-02-20 NOTE — Patient Instructions (Addendum)
Thank you for meeting with me today!  I look forward to working with you to help you meet all of your healthcare goals and answer any questions you may have.  Feel free to contact me anytime!    Visit Information  Goals Addressed            This Visit's Progress   . Hyperlipidemia - LDL <100       CARE PLAN ENTRY (see longitudinal plan of care for additional care plan information)  Current Barriers:  . Uncontrolled hyperlipidemia, complicated by pre-diabetes, hypertension. . Current antihyperlipidemic regimen none . Previous antihyperlipidemic medications tried: pravastatin, atorvastatin, rosuvastatin . Most recent lipid panel:     Component Value Date/Time   CHOL 209 (H) 10/30/2018 0940   TRIG 215 (H) 01/02/2020 1544   HDL 31 (L) 10/30/2018 0940   CHOLHDL 6.7 (H) 10/30/2018 0940   VLDL 71 (H) 06/15/2016 1036   LDLCALC 134 (H) 10/30/2018 0940   . ASCVD risk enhancing conditions: age >18, DM, HTN, CKD, CHF, current smoker . 10-year ASCVD risk score: 19.5%  Pharmacist Clinical Goal(s):  Marland Kitchen Over the next 30 days, patient will work with PharmD and providers towards achieving lipid goals through continuation of healthier diet.  Interventions: . Comprehensive medication review performed; medication list updated in electronic medical record.  . Continue current diet modifications.  Patient Self Care Activities:  . Over the next 30 days patient will continue to work on diet to promote good cholesterol levels.  Initial goal documentation     . Hypertension - BP < 130/80       CARE PLAN ENTRY (see longitudinal plan of care for additional care plan information)  Current Barriers:  . Controlled hypertension, complicated by pre-diabetes, hyperlipidemia. . Current antihypertensive regimen: metoprolol 25mg  1/2 tablet daily . Previous antihypertensives tried: benazepril . Last practice recorded BP readings:  BP Readings from Last 3 Encounters:  02/10/20 100/70  01/23/20 112/74   01/14/20 126/83     Pharmacist Clinical Goal(s):  Marland Kitchen Over the next 30 days, patient will work with PharmD and providers to optimize antihypertensive regimen  Interventions: . Inter-disciplinary care team collaboration (see longitudinal plan of care) . Comprehensive medication review performed; medication list updated in the electronic medical record.  . Continue current therapy.  Patient Self Care Activities:  . Patient will continue to check BP and document, and provide at future appointments . Patient will focus on medication adherence by pill count.  Initial goal documentation     . Pre-Diabetes - A1c < 5.7       CARE PLAN ENTRY (see longitudinal plan of care for additional care plan information)  Current Barriers:  . Prediabetes; complicated by chronic medical conditions including hypertension, hyperlipidemia. Lab Results  Component Value Date   HGBA1C 6.1 (H) 01/02/2020 .   Lab Results  Component Value Date   CREATININE 2.02 (H) 02/07/2020   CREATININE 2.15 (H) 01/23/2020   CREATININE 2.56 (H) 01/13/2020   . Current antihyperglycemic regimen: none . Current meal patterns: patient is starting to watch carbohydrates as discussed with Dr. Dennard Schaumann at last PCP visit. . Current exercise: not much since COVID- 19 diagnosis . Current blood glucose readings: Reported blood glucose of 115-137 two hours after a meal. . Cardiovascular risk reduction: o Current hypertensive regimen: metoprolol succinate 25mg  one-half tablet daily o Current hyperlipidemia regimen: none o Current antiplatelet regimen: none  Pharmacist Clinical Goal(s):  Marland Kitchen Over the next 30 days, patient will work with PharmD and primary  care provider to address historical elevated blood sugars.  Interventions: . Comprehensive medication review performed, medication list updated in electronic medical record . Inter-disciplinary care team collaboration (see longitudinal plan of care) . Continue to work on dietary  changes such as watching carbohydrates.  Patient Self Care Activities:  . Patient will check blood glucose after meals , document, and provide at future appointments . Patient will focus on medication adherence by pill count . Patient will contact provider with any episodes of hypoglycemia . Patient will report any questions or concerns to provider   Initial goal documentation        Mr. Hippert was given information about Chronic Care Management services today including:  1. CCM service includes personalized support from designated clinical staff supervised by his physician, including individualized plan of care and coordination with other care providers 2. 24/7 contact phone numbers for assistance for urgent and routine care needs. 3. Standard insurance, coinsurance, copays and deductibles apply for chronic care management only during months in which we provide at least 20 minutes of these services. Most insurances cover these services at 100%, however patients may be responsible for any copay, coinsurance and/or deductible if applicable. This service may help you avoid the need for more expensive face-to-face services. 4. Only one practitioner may furnish and bill the service in a calendar month. 5. The patient may stop CCM services at any time (effective at the end of the month) by phone call to the office staff.  Patient agreed to services and verbal consent obtained.   The patient verbalized understanding of instructions provided today and agreed to receive a mailed copy of patient instruction and/or educational materials. The pharmacy team will reach out to the patient again over the next 30 days.  Patient will let us know if they want to continue with CCM determine if patient wants to continue with CCM.   Beverly Milch, PharmD Clinical Pharmacist [facility] [phone]   Diabetes Mellitus and Nutrition, Adult When you have diabetes (diabetes mellitus),  it is very important to have healthy eating habits because your blood sugar (glucose) levels are greatly affected by what you eat and drink. Eating healthy foods in the appropriate amounts, at about the same times every day, can help you:  Control your blood glucose.  Lower your risk of heart disease.  Improve your blood pressure.  Reach or maintain a healthy weight. Every person with diabetes is different, and each person has different needs for a meal plan. Your health care provider may recommend that you work with a diet and nutrition specialist (dietitian) to make a meal plan that is best for you. Your meal plan may vary depending on factors such as:  The calories you need.  The medicines you take.  Your weight.  Your blood glucose, blood pressure, and cholesterol levels.  Your activity level.  Other health conditions you have, such as heart or kidney disease. How do carbohydrates affect me? Carbohydrates, also called carbs, affect your blood glucose level more than any other type of food. Eating carbs naturally raises the amount of glucose in your blood. Carb counting is a method for keeping track of how many carbs you eat. Counting carbs is important to keep your blood glucose at a healthy level, especially if you use insulin or take certain oral diabetes medicines. It is important to know how many carbs you can safely have in each meal. This is different for every person. Your dietitian can help  you calculate how many carbs you should have at each meal and for each snack. Foods that contain carbs include:  Bread, cereal, rice, pasta, and crackers.  Potatoes and corn.  Peas, beans, and lentils.  Milk and yogurt.  Fruit and juice.  Desserts, such as cakes, cookies, ice cream, and candy. How does alcohol affect me? Alcohol can cause a sudden decrease in blood glucose (hypoglycemia), especially if you use insulin or take certain oral diabetes medicines. Hypoglycemia can be a  life-threatening condition. Symptoms of hypoglycemia (sleepiness, dizziness, and confusion) are similar to symptoms of having too much alcohol. If your health care provider says that alcohol is safe for you, follow these guidelines:  Limit alcohol intake to no more than 1 drink per day for nonpregnant women and 2 drinks per day for men. One drink equals 12 oz of beer, 5 oz of wine, or 1 oz of hard liquor.  Do not drink on an empty stomach.  Keep yourself hydrated with water, diet soda, or unsweetened iced tea.  Keep in mind that regular soda, juice, and other mixers may contain a lot of sugar and must be counted as carbs. What are tips for following this plan?  Reading food labels  Start by checking the serving size on the "Nutrition Facts" label of packaged foods and drinks. The amount of calories, carbs, fats, and other nutrients listed on the label is based on one serving of the item. Many items contain more than one serving per package.  Check the total grams (g) of carbs in one serving. You can calculate the number of servings of carbs in one serving by dividing the total carbs by 15. For example, if a food has 30 g of total carbs, it would be equal to 2 servings of carbs.  Check the number of grams (g) of saturated and trans fats in one serving. Choose foods that have low or no amount of these fats.  Check the number of milligrams (mg) of salt (sodium) in one serving. Most people should limit total sodium intake to less than 2,300 mg per day.  Always check the nutrition information of foods labeled as "low-fat" or "nonfat". These foods may be higher in added sugar or refined carbs and should be avoided.  Talk to your dietitian to identify your daily goals for nutrients listed on the label. Shopping  Avoid buying canned, premade, or processed foods. These foods tend to be high in fat, sodium, and added sugar.  Shop around the outside edge of the grocery store. This includes fresh  fruits and vegetables, bulk grains, fresh meats, and fresh dairy. Cooking  Use low-heat cooking methods, such as baking, instead of high-heat cooking methods like deep frying.  Cook using healthy oils, such as olive, canola, or sunflower oil.  Avoid cooking with butter, cream, or high-fat meats. Meal planning  Eat meals and snacks regularly, preferably at the same times every day. Avoid going long periods of time without eating.  Eat foods high in fiber, such as fresh fruits, vegetables, beans, and whole grains. Talk to your dietitian about how many servings of carbs you can eat at each meal.  Eat 4-6 ounces (oz) of lean protein each day, such as lean meat, chicken, fish, eggs, or tofu. One oz of lean protein is equal to: ? 1 oz of meat, chicken, or fish. ? 1 egg. ?  cup of tofu.  Eat some foods each day that contain healthy fats, such as avocado, nuts, seeds,  and fish. Lifestyle  Check your blood glucose regularly.  Exercise regularly as told by your health care provider. This may include: ? 150 minutes of moderate-intensity or vigorous-intensity exercise each week. This could be brisk walking, biking, or water aerobics. ? Stretching and doing strength exercises, such as yoga or weightlifting, at least 2 times a week.  Take medicines as told by your health care provider.  Do not use any products that contain nicotine or tobacco, such as cigarettes and e-cigarettes. If you need help quitting, ask your health care provider.  Work with a Social worker or diabetes educator to identify strategies to manage stress and any emotional and social challenges. Questions to ask a health care provider  Do I need to meet with a diabetes educator?  Do I need to meet with a dietitian?  What number can I call if I have questions?  When are the best times to check my blood glucose? Where to find more information:  American Diabetes Association: diabetes.org  Academy of Nutrition and  Dietetics: www.eatright.[employer] of Diabetes and Digestive and Kidney Diseases (NIH): [employer] Summary  A healthy meal plan will help you control your blood glucose and maintain a healthy lifestyle.  Working with a diet and nutrition specialist (dietitian) can help you make a meal plan that is best for you.  Keep in mind that carbohydrates (carbs) and alcohol have immediate effects on your blood glucose levels. It is important to count carbs and to use alcohol carefully. This information is not intended to replace advice given to you by your health care provider. Make sure you discuss any questions you have with your health care provider. Document Revised: 09/22/2017 Document Reviewed: 11/14/2016 Elsevier Patient Education  2020 [employer].

## 2020-02-21 ENCOUNTER — Telehealth: Payer: Medicare Other

## 2020-03-02 ENCOUNTER — Ambulatory Visit (INDEPENDENT_AMBULATORY_CARE_PROVIDER_SITE_OTHER): Payer: Medicare Other | Admitting: Family Medicine

## 2020-03-02 ENCOUNTER — Other Ambulatory Visit: Payer: Self-pay

## 2020-03-02 ENCOUNTER — Encounter: Payer: Self-pay | Admitting: Family Medicine

## 2020-03-02 VITALS — BP 136/80 | HR 70 | Temp 96.2°F | Resp 16 | Ht 66.0 in | Wt 199.0 lb

## 2020-03-02 DIAGNOSIS — R7303 Prediabetes: Secondary | ICD-10-CM

## 2020-03-02 DIAGNOSIS — N1832 Chronic kidney disease, stage 3b: Secondary | ICD-10-CM | POA: Diagnosis not present

## 2020-03-02 DIAGNOSIS — J9611 Chronic respiratory failure with hypoxia: Secondary | ICD-10-CM

## 2020-03-02 LAB — BASIC METABOLIC PANEL WITH GFR
BUN/Creatinine Ratio: 13 (calc) (ref 6–22)
BUN: 25 mg/dL (ref 7–25)
CO2: 26 mmol/L (ref 20–32)
Calcium: 9.7 mg/dL (ref 8.6–10.3)
Chloride: 103 mmol/L (ref 98–110)
Creat: 1.87 mg/dL — ABNORMAL HIGH (ref 0.70–1.18)
GFR, Est African American: 41 mL/min/{1.73_m2} — ABNORMAL LOW (ref >=60)
GFR, Est Non African American: 35 mL/min/{1.73_m2} — ABNORMAL LOW (ref >=60)
Glucose, Bld: 130 mg/dL — ABNORMAL HIGH (ref 65–99)
Potassium: 3.3 mmol/L — ABNORMAL LOW (ref 3.5–5.3)
Sodium: 140 mmol/L (ref 135–146)

## 2020-03-02 MED ORDER — FUROSEMIDE 40 MG PO TABS: 40.0000 mg | ORAL_TABLET | Freq: Two times a day (BID) | ORAL | 3 refills | Status: DC

## 2020-03-02 MED ORDER — POTASSIUM CHLORIDE ER 10 MEQ PO TBCR: 10.0000 meq | EXTENDED_RELEASE_TABLET | Freq: Every day | ORAL | 3 refills | Status: DC

## 2020-03-02 NOTE — Progress Notes (Signed)
Subjective:    Patient ID: Mike Davila, male    DOB: 1946-05-10, 74 y.o.   MRN: [id number]  HPI  Unfortunately, the patient was recently admitted to the hospital with COVID-19.  He subsequently developed pneumonia related to this and oxygen dependent respiratory failure.  Patient states at 1 point he was on 15 L however he was recently discharged from the hospital on 6 L via nasal cannula.  I have copied relevant portions of his discharge summary below and included them for my reference.  Admit date: 01/02/2020 Discharge date: 01/14/2020  Time spent: 35 minutes  Recommendations for Outpatient Follow-up:  1. Repeat basic metabolic panel to follow across renal function 2. Reassess blood pressure and further adjust antihypertensive treatment as needed 3. Close monitoring to patient CBGs after being on steroids with further determination to initiate hypoglycemic treatment if required. 4. Continue weaning oxygen supplementation of as tolerated repeat chest x-ray in 4-6 weeks to assure complete resolution of infiltrates.   Discharge Diagnoses:  Principal Problem:   Pneumonia due to COVID-19 virus Active Problems:   Hypertension   Acute renal failure superimposed on stage 4 chronic kidney disease (HCC)   CKD (chronic kidney disease), stage III   Prediabetes   Acute respiratory failure with hypoxia (HCC)   Class 1 obesity due to excess calories with body mass index (BMI) of 33.0 to 33.9 in adult   Discharge Condition: Stable and improved.  Patient discharged home with instruction to follow-up with PCP in 2 weeks.  Home health PT and oxygen supplementation arranged at time of discharge.  CODE STATUS: Full code.  Diet recommendation: Heart healthy diet and modified carbohydrate diet.       Filed Weights   01/12/20 0420 01/13/20 0523 01/14/20 0542  Weight: 90.1 kg 86.1 kg 85.1 kg    History of present illness:  As per H&P written by Dr. Denton Brick on 01/02/2020 73  y.o.malewith medical history significant forCKD 3, hypertension,prediabetes. Patient presented to the ED with complaints of 1 week of difficulty breathing, and cough. He also reports some mild diarrhea. Patient spouse tested positive for COVID-19 infection March 1,she was sick but did not have any difficulty breathing,she did not require hospitalization.Patient has not been tested. He tells me he has not been vaccinated because he has not been able to get it/have access to it. He denies chest pain.  ED Course:O2 sats 87-89% on room air improved with 2 L nasal cannula to 95%. Temperature 100. Tachypneic. WBC 3.7. Normal lactic acid 3.9. Elevated creatinine inflammatory markers. Creatinine 2.65, at baseline. Possible chest x-ray consistent with COVID-19 infection,showing extensive bilateral heterogeneous airspace opacities consistent with multifocal infection.POCCOVID-19 test negative. Covid PCR test pending. Also elevated procalcitonin at 0.3. Hospitalist admit for further evaluation and management.  Hospital Course:   Acute hypoxemic respiratory failure secondary to COVID-19 pneumonia-persistent -Actemra administered on 3/14 -Repeated chest x-ray stableon 3/16, demonstrating no significant interval change in bilateral airspace disease. Finding consistent with Covid pneumonia given provided history. -Continue dexamethasone for 5 days at discharge. -Patient has completed 5 days of remdesivir while inpatient.. -Continue stepdown unit monitoringfor now and appreciateongoing pulmonology and nephrology consultation/assistance. -Currently on5L high flow nasal cannulaat rest.supplementation; oxygen saturation goal is for 88-92%. -Patient has been instructed to use incentive spirometer and flutter valve -Continue bronchodilators and weaning O2 off process over time as tolerated. -Outpatient follow-up with pulmonary service on as-needed  basis.  Hypertension-stable -Continue home metoprololand Lasix at adjusted dose. -Vital signs stable. -Heart healthy diet  has been recommended.  AKI onCKD stage4-improved -Baseline creatinine 2.2-2.6 -Currentlyat 2.5,appreciate nephrology,patient has diuresed well after Lasix IV given. -continue lasix 60m BID now -no need for HD -Continue to to follow daily weights and Continue low-sodium diet.  Prediabetes-with steroid-induced hyperglycemia -Monitor carefully while on steroids -SSI used inside hospital -A1c 6.1% -Modified carbohydrate diet has been encouraged at time of discharge.  BPH -Continue home alfuzosin -No symptoms of urinary retention reported at time of discharge.  Anxiety -Patient appears to be more relaxed. -As needed Xanax was used for hospitalized; patient can be reassessed by his PCP as an outpatient and further anxiolytic therapy to be decided.  Class I obesity -Body mass index is 33.23 kg/m. -Low calorie diet, portion control and increase physical activity discussed with patient.  Physical deconditioning -Home health PT has been recommended and arranged prior to discharge.  Procedures:  See below for x-ray reports  Consultations:  Nephrology service  Pulmonologist.  01/23/20 Patient is here today with his family for follow-up.  He is currently wearing 6 L however he admits that he takes it off sometimes when he is resting.  I took his oxygen off and while he was sitting still, we had a conversation.  His oxygen dropped to 92 to 93% on room air at rest.  However with ambulation off oxygen, his oxygen level quickly plummeted to 86 on room air.  Ultimately the patient required 3 L of oxygen via nasal cannula to maintain oxygen saturations greater than 90% with activity.  Therefore I recommended that the patient maintain oxygen 3 L via nasal cannula with activity and at night while he sleeps.  However he does appear to be improving.  He denies  any chest pain shortness of breath or dyspnea on exertion.  He states that he cannot really tell that he needs the oxygen and is eager to get rid of it.  He is eating better now.  He denies any nausea or vomiting.  He denies any diarrhea.  He denies any dysuria.  He denies any blood in his stool.  He has completed his steroids.  At that time, my plan was: Patient appears to be improving since discharge from hospital.  He will continue oxygen via nasal cannula at 3 L with activity and while sleeping.  I will recheck the patient in 2 weeks and at that time I suspect that he will likely be able to discontinue oxygen altogether.  I will check a CBC today to monitor for any anemia and I will also check a CMP to monitor his renal function and blood sugar.  Patient is currently taking 40 mg of Lasix twice daily.  There is no pitting edema on exam and no evidence of fluid overload.  As long as lab work is normal, I will likely decrease his Lasix to 40 mg once a day.  I believe he can discontinue his fluid restriction at this time as he does not appear fluid overloaded.  Spent more than 30 minutes today with the patient and his family answering her questions.  Recheck here in 2 weeks.  Recommended postponing for his dental surgery until he is off oxygen.  02/10/20 Recently had labs (4/16).  Blood sugar 273 despite being off steroids and potassium remains low.  Liver tests have improved.  Renal fcn is stable.  I did perform a finger check blood sugar today.  He is eating 3 hours ago.  His blood sugar was over 140 but was nowhere near the  273 from Friday.  This is much more reassuring.  Patient denies any polyuria polydipsia or blurry vision.  He continues to take his fluid pill twice a day however there is no pitting edema in his extremities.  He denies any cough or orthopnea.  His oxygen is 96% on room air sitting however with activity it drops to 88% after walking 60 feet.  His breathing is better but certainly not back  to his baseline. At that time, my plan was: Breathing has improved but not yet back to baseline.  Continue oxygen 3 L with activity and recheck in 2 to 3 weeks.  Begin K-Lor 10 mEq a day for hypokalemia and reduce Lasix to 40 mg once a day to avoid dehydration.  He was previously on 40 mg once a day and was just recently increased to twice a day in the hospital.  Patient will check his fasting blood sugar and his 2-hour postprandial sugars for the next week and notify me on Friday of the values.  I am hoping that the 273 was an isolated fluke however the patient's prediabetes may have progressed to overt diabetes given his recent physical stress.  Await results of his labs and home sugar monitoring.  03/02/20 Patient is here today for a checkup.  Patient is 99% on 2 L however when he takes his oxygen off sitting at rest he is 92 to 93%.  With activity he drops to 87% on room air.  He quickly rebounds to 99% with oxygen however he finds himself becoming easily winded without oxygen with exercise.  This appears to be a new chronic baseline for this patient given his Covid that he had earlier this year.  Unfortunately the patient is trying to form.  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  This is actually creating more work for the patient that is dangerous.  He would like to have some type of portable oxygen concentrator which I believe will be entirely appropriate given the fact he still trying to work in a physically demanding job.  Otherwise he is doing well.  His blood sugars are between 120 and 130.  He is having to use Lasix 40 mg twice a day.  He has gained 5 pounds since his last visit after he cut his Lasix dose once a day.  He started noticing edema in his legs and in his face and therefore he increased it back to twice a day on his  own which I believe is appropriate   Past Medical History:  Diagnosis Date  . Anemia   . Benign localized prostatic hyperplasia with lower urinary tract symptoms (LUTS)   . CKD (chronic kidney disease), stage III    nephrologist-  dr Hinda Lenis ([facility] in [place])-- per pt last visit 06/ 2019  . Diverticulosis of colon   . ED (erectile dysfunction)   . History of acute gouty arthritis 10/23/2017  . History of acute renal failure 10/23/2017   hospital admission-- w/ acute tubular necrosis superimposed on ckd 3--- resolved  . History of diverticulitis of colon 10/05/2017   admission-- mild-- resolved w/ medical management  . History of metabolic acidosis 29/47/6546   admission-- resolved  . History of palpitations    approx. 2009, per pt no issue since  . History  of urinary retention 10/2017  . Hypertension   . Incomplete right bundle branch block   . Mixed hyperlipidemia   . Nephrolithiasis    left side nonobstructive  . Pre-diabetes   . Right ureteral stone   . Wears dentures    upper    Past Surgical History:  Procedure Laterality Date  . ANKLE SURGERY Bilateral 1990s to early 2000s   removal bone spurs  . APPENDECTOMY  child  . COLONOSCOPY N/A 10/27/2017   Procedure: COLONOSCOPY;  Surgeon: Rogene Houston, MD;  Location: [facility];  Service: Endoscopy;  Laterality: N/A;  . CYSTOSCOPY/URETEROSCOPY/HOLMIUM LASER/STENT PLACEMENT Right 05/09/2018   Procedure: CYSTOSCOPY RIGHT URETEROSCOPY/HOLMIUM LASER/STENT PLACEMENT;  Surgeon: Ardis Hughs, MD;  Location: [facility];  Service: Urology;  Laterality: Right;  . HEMATOMA EVACUATION  1990s   right calf  . INGUINAL HERNIA REPAIR Bilateral age 90s  . PLANTAR FASCIA SURGERY Left 2004  . TONSILLECTOMY  child  . TRANSTHORACIC ECHOCARDIOGRAM  10/24/2017   ef 60-65%,  grade 1 diastolic dysfunction   Current Outpatient Medications on File Prior to Visit  Medication Sig Dispense Refill  . Accu-Chek  Softclix Lancets lancets Check bs bid DX: R73.03 100 each 12  . albuterol (VENTOLIN HFA) 108 (90 Base) MCG/ACT inhaler Inhale 2 puffs into the lungs 3 (three) times daily. (Patient not taking: Reported on 02/20/2020) 8 g 1  . alfuzosin (UROXATRAL) 10 MG 24 hr tablet Take 1 tablet (10 mg total) by mouth daily with breakfast. 90 tablet 3  . ALPRAZolam (XANAX) 0.5 MG tablet Take 1 tablet (0.5 mg total) by mouth 3 (three) times daily as needed. (Patient not taking: Reported on 02/20/2020) 30 tablet 0  . ascorbic acid (VITAMIN C) 500 MG tablet Take 1 tablet (500 mg total) by mouth daily. 30 tablet 1  . atorvastatin (LIPITOR) 20 MG tablet Take 1 tablet (20 mg total) by mouth daily. (Patient not taking: Reported on 02/10/2020) 90 tablet 1  . Blood Glucose Monitoring Suppl (ACCU-CHEK GUIDE) w/Device KIT 1 Units by Does not apply route in the morning and at bedtime. 1 kit 0  . furosemide (LASIX) 40 MG tablet Take 1 tablet (40 mg total) by mouth 2 (two) times daily. 60 tablet 1  . glucose blood (ACCU-CHEK GUIDE) test strip Check bs bid DX: R73.03 100 each 3  . guaiFENesin-dextromethorphan (ROBITUSSIN DM) 100-10 MG/5ML syrup Take 10 mLs by mouth every 4 (four) hours as needed for cough. (Patient not taking: Reported on 02/20/2020) 118 mL 0  . Lancets Misc. (ACCU-CHEK SOFTCLIX LANCET DEV) KIT Check bs bid DX: R73.03 1 kit 0  . metoprolol succinate (TOPROL-XL) 25 MG 24 hr tablet Take 0.5 tablets (12.5 mg total) by mouth daily. 60 tablet 2  . Multiple Vitamin (MULTIVITAMIN WITH MINERALS) TABS tablet Take 1 tablet by mouth daily. (Patient not taking: Reported on 02/20/2020) 30 tablet 1  . OXYGEN Inhale 3 L into the lungs. Continous     . potassium chloride (KLOR-CON) 10 MEQ tablet Take 1 tablet (10 mEq total) by mouth daily. 30 tablet 1  . sodium chloride (OCEAN) 0.65 % SOLN nasal spray Place 1 spray into both nostrils as needed for congestion. (Patient not taking: Reported on 02/20/2020) 60 mL 1  . zinc sulfate 220 (50  Zn) MG capsule Take 1 capsule (220 mg total) by mouth daily. (Patient not taking: Reported on 02/20/2020) 30 capsule 1   No current facility-administered medications on file prior to visit.   Allergies  Allergen Reactions  . Sulfa Antibiotics Hives and Shortness Of Breath  . Crestor [Rosuvastatin Calcium] Other (See Comments)    Leg cramps  . Lipitor [Atorvastatin Calcium] Other (See Comments)    Leg cramps  . Tetanus Toxoids Other (See Comments)    Unknown reaction   Social History   Socioeconomic History  . Marital status: Single    Spouse name: Not on file  . Number of children: Not on file  . Years of education: Not on file  . Highest education level: Not on file  Occupational History  . Not on file  Tobacco Use  . Smoking status: Former Smoker    Years: 20.00    Types: Cigarettes    Quit date: 05/04/2006    Years since quitting: 13.8  . Smokeless tobacco: Former Systems developer    Types: [place] date: 05/04/2017  Substance and Sexual Activity  . Alcohol use: No  . Drug use: No  . Sexual activity: Yes  Other Topics Concern  . Not on file  Social History Narrative   Married.   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   Social Determinants of Health   Financial Resource Strain:   . Difficulty of Paying Living Expenses:   Food Insecurity:   . Worried About Charity fundraiser in the Last Year:   . Arboriculturist in the Last Year:   Transportation Needs:   . Film/video editor (Medical):   Marland Kitchen Lack of Transportation (Non-Medical):   Physical Activity:   . Days of Exercise per Week:   . Minutes of Exercise per Session:   Stress:   . Feeling of Stress :   Social Connections:   . Frequency of Communication with Friends and Family:   . Frequency of Social Gatherings with Friends and Family:   . Attends Religious Services:   . Active Member of Clubs or Organizations:   . Attends Archivist Meetings:   Marland Kitchen Marital Status:   Intimate Partner Violence:   . Fear of Current or  Ex-Partner:   . Emotionally Abused:   Marland Kitchen Physically Abused:   . Sexually Abused:    Family History  Problem Relation Age of Onset  . Thyroid cancer Mother   . Heart disease Father        MI  . Colon cancer Father   . Melanoma Father   . Benign prostatic hyperplasia Brother   . Diabetes Mellitus II Maternal Grandmother   . Ovarian cancer Daughter     Review of Systems  All other systems reviewed and are negative.      Objective:   Physical Exam Vitals reviewed.  Constitutional:      General: He is not in acute distress.    Appearance: Normal appearance. He is normal weight. He is not ill-appearing, toxic-appearing or diaphoretic.  HENT:     Head: Normocephalic and atraumatic.     Right Ear: Tympanic membrane, ear canal and external ear normal.     Left Ear: Tympanic membrane, ear canal and external ear normal.     Nose: Nose normal. No congestion or rhinorrhea.     Mouth/Throat:     Mouth: Mucous membranes are moist.     Pharynx: No oropharyngeal exudate or posterior oropharyngeal erythema.  Eyes:     General: No scleral icterus.       Right eye: No discharge.        Left eye: No discharge.     Extraocular Movements: Extraocular  movements intact.     Conjunctiva/sclera: Conjunctivae normal.     Pupils: Pupils are equal, round, and reactive to light.  Neck:     Vascular: No carotid bruit.  Cardiovascular:     Rate and Rhythm: Normal rate and regular rhythm.     Pulses: Normal pulses.     Heart sounds: Normal heart sounds. No murmur. No friction rub. No gallop.   Pulmonary:     Effort: Pulmonary effort is normal. No respiratory distress.     Breath sounds: No stridor. Examination of the right-middle field reveals rales. Examination of the right-lower field reveals rales. Rales present. No wheezing or rhonchi.  Chest:     Chest wall: No tenderness.  Abdominal:     General: Abdomen is flat. Bowel sounds are normal. There is no distension.     Palpations: Abdomen is  soft. There is no mass.     Tenderness: There is no abdominal tenderness. There is no right CVA tenderness, left CVA tenderness, guarding or rebound.     Hernia: No hernia is present.  Musculoskeletal:        General: No swelling or tenderness. Normal range of motion.     Cervical back: Normal range of motion and neck supple. No rigidity. No muscular tenderness.     Right lower leg: No edema.     Left lower leg: No edema.  Lymphadenopathy:     Cervical: No cervical adenopathy.  Skin:    General: Skin is warm.     Coloration: Skin is not jaundiced or pale.     Findings: No bruising, erythema, lesion or rash.  Neurological:     General: No focal deficit present.     Mental Status: He is alert and oriented to person, place, and time. Mental status is at baseline.     Cranial Nerves: No cranial nerve deficit.     Motor: No weakness.     Coordination: Coordination normal.     Gait: Gait normal.     Deep Tendon Reflexes: Reflexes normal.  Psychiatric:        Mood and Affect: Mood normal.        Behavior: Behavior normal.        Thought Content: Thought content normal.        Judgment: Judgment normal.         Assessment & Plan:  Chronic respiratory failure with hypoxia (HCC)  Stage 3b chronic kidney disease - Plan: BASIC METABOLIC PANEL WITH GFR  Prediabetes - Plan: BASIC METABOLIC PANEL WITH GFR, potassium chloride (KLOR-CON) 10 MEQ tablet  I believe the patient has reached a new chronic baseline regarding his hypoxia.  He requires 2 L of oxygen with activity.  At rest he does not require it however he does require the oxygen tomorrow.  Patient still works full-time as a Psychologist, sport and exercise.  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  I will try to get the patient approved for a satchel type oxygen concentrator that would be much easier for him.  Increase Lasix back to 40 mg twice a day and continue K. Dur 10 mEq a day with this.  Recheck renal  function today.  Blood sugars are between 120 and 130 without medication.  I encouraged the patient to continue monitoring this and we will recheck his lab work in 3 months.

## 2020-03-03 ENCOUNTER — Ambulatory Visit: Payer: Self-pay | Admitting: Pharmacist

## 2020-03-03 ENCOUNTER — Telehealth: Payer: Self-pay | Admitting: Family Medicine

## 2020-03-03 NOTE — Telephone Encounter (Signed)
#  [phone] Call to inform [facility] (pharmacy) [place] will delivery oxygen  pt will reach out if he has  any other problems,. Per Pt no return call back need

## 2020-03-03 NOTE — Chronic Care Management (AMB) (Signed)
  Chronic Care Management   Follow Up Note   03/03/2020 Name: Mike Davila MRN: [id number] DOB: [birth year]  Referred by: [name], MD Reason for referral : No chief complaint on file.   Mike Davila is a 74 y.o. year old male who is a primary care patient of Pickard, Cammie Mcgee, MD. The CCM team was consulted for assistance with chronic disease management and care coordination needs.    [name] called to notify my of his short supply of backup tanks for his oxygen use.  I spent time on the phone with [place] coordinating delivery of tanks to him and following up on status of order placed this morning by him and his wife.    If he does not get the tanks delivered he would like to switch his oxygen supplier to [employer].    He will follow up with me later in the day to notify me on the status of the tanks from [place].  Beverly Milch, PharmD Clinical Pharmacist [place] [phone]

## 2020-03-04 ENCOUNTER — Other Ambulatory Visit: Payer: Self-pay | Admitting: Family Medicine

## 2020-03-04 MED ORDER — POTASSIUM CHLORIDE CRYS ER 20 MEQ PO TBCR: 20.0000 meq | EXTENDED_RELEASE_TABLET | Freq: Every day | ORAL | 3 refills | Status: DC

## 2020-03-05 NOTE — Progress Notes (Signed)
I have collaborated with the care management provider regarding care management and care coordination activities outlined in this encounter and have reviewed this encounter including documentation in the note and care plan. I am certifying that I agree with the content of this note and encounter as supervising physician.  

## 2020-03-09 DIAGNOSIS — I129 Hypertensive chronic kidney disease with stage 1 through stage 4 chronic kidney disease, or unspecified chronic kidney disease: Secondary | ICD-10-CM | POA: Diagnosis not present

## 2020-03-09 DIAGNOSIS — N400 Enlarged prostate without lower urinary tract symptoms: Secondary | ICD-10-CM | POA: Diagnosis not present

## 2020-03-09 DIAGNOSIS — N184 Chronic kidney disease, stage 4 (severe): Secondary | ICD-10-CM | POA: Diagnosis not present

## 2020-03-09 DIAGNOSIS — R7303 Prediabetes: Secondary | ICD-10-CM | POA: Diagnosis not present

## 2020-03-17 ENCOUNTER — Telehealth: Payer: Self-pay

## 2020-03-17 NOTE — Telephone Encounter (Signed)
Pt's wife was here with her Dad to see Crystal and she wants to know if you will send an Rx for a portable o2 tank to [place] for pt. Fax number: [phone]. Please advise.

## 2020-03-27 ENCOUNTER — Telehealth: Payer: Self-pay | Admitting: Family Medicine

## 2020-03-27 NOTE — Telephone Encounter (Signed)
CB# [phone] Pt need for Dr.Pickard to call [place] [phone] request for home to have a small oxygen tank also the amount of liters of oxygen pt on

## 2020-03-30 NOTE — Telephone Encounter (Signed)
That rx was sent back over to [facility]

## 2020-04-07 ENCOUNTER — Other Ambulatory Visit: Payer: Self-pay

## 2020-04-07 ENCOUNTER — Ambulatory Visit (INDEPENDENT_AMBULATORY_CARE_PROVIDER_SITE_OTHER): Payer: Medicare Other | Admitting: Family Medicine

## 2020-04-07 VITALS — BP 120/80 | HR 78 | Temp 97.8°F | Ht 66.0 in | Wt 196.0 lb

## 2020-04-07 DIAGNOSIS — J9611 Chronic respiratory failure with hypoxia: Secondary | ICD-10-CM | POA: Diagnosis not present

## 2020-04-07 NOTE — Progress Notes (Signed)
Subjective:    Patient ID: Mike Davila, male    DOB: 1946-05-10, 74 y.o.   MRN: [id number]  HPI  Unfortunately, the patient was recently admitted to the hospital with COVID-19.  He subsequently developed pneumonia related to this and oxygen dependent respiratory failure.  Patient states at 1 point he was on 15 L however he was recently discharged from the hospital on 6 L via nasal cannula.  I have copied relevant portions of his discharge summary below and included them for my reference.  Admit date: 01/02/2020 Discharge date: 01/14/2020  Time spent: 35 minutes  Recommendations for Outpatient Follow-up:  1. Repeat basic metabolic panel to follow across renal function 2. Reassess blood pressure and further adjust antihypertensive treatment as needed 3. Close monitoring to patient CBGs after being on steroids with further determination to initiate hypoglycemic treatment if required. 4. Continue weaning oxygen supplementation of as tolerated repeat chest x-ray in 4-6 weeks to assure complete resolution of infiltrates.   Discharge Diagnoses:  Principal Problem:   Pneumonia due to COVID-19 virus Active Problems:   Hypertension   Acute renal failure superimposed on stage 4 chronic kidney disease (HCC)   CKD (chronic kidney disease), stage III   Prediabetes   Acute respiratory failure with hypoxia (HCC)   Class 1 obesity due to excess calories with body mass index (BMI) of 33.0 to 33.9 in adult   Discharge Condition: Stable and improved.  Patient discharged home with instruction to follow-up with PCP in 2 weeks.  Home health PT and oxygen supplementation arranged at time of discharge.  CODE STATUS: Full code.  Diet recommendation: Heart healthy diet and modified carbohydrate diet.       Filed Weights   01/12/20 0420 01/13/20 0523 01/14/20 0542  Weight: 90.1 kg 86.1 kg 85.1 kg    History of present illness:  As per H&P written by Dr. Denton Brick on 01/02/2020 73  y.o.malewith medical history significant forCKD 3, hypertension,prediabetes. Patient presented to the ED with complaints of 1 week of difficulty breathing, and cough. He also reports some mild diarrhea. Patient spouse tested positive for COVID-19 infection March 1,she was sick but did not have any difficulty breathing,she did not require hospitalization.Patient has not been tested. He tells me he has not been vaccinated because he has not been able to get it/have access to it. He denies chest pain.  ED Course:O2 sats 87-89% on room air improved with 2 L nasal cannula to 95%. Temperature 100. Tachypneic. WBC 3.7. Normal lactic acid 3.9. Elevated creatinine inflammatory markers. Creatinine 2.65, at baseline. Possible chest x-ray consistent with COVID-19 infection,showing extensive bilateral heterogeneous airspace opacities consistent with multifocal infection.POCCOVID-19 test negative. Covid PCR test pending. Also elevated procalcitonin at 0.3. Hospitalist admit for further evaluation and management.  Hospital Course:   Acute hypoxemic respiratory failure secondary to COVID-19 pneumonia-persistent -Actemra administered on 3/14 -Repeated chest x-ray stableon 3/16, demonstrating no significant interval change in bilateral airspace disease. Finding consistent with Covid pneumonia given provided history. -Continue dexamethasone for 5 days at discharge. -Patient has completed 5 days of remdesivir while inpatient.. -Continue stepdown unit monitoringfor now and appreciateongoing pulmonology and nephrology consultation/assistance. -Currently on5L high flow nasal cannulaat rest.supplementation; oxygen saturation goal is for 88-92%. -Patient has been instructed to use incentive spirometer and flutter valve -Continue bronchodilators and weaning O2 off process over time as tolerated. -Outpatient follow-up with pulmonary service on as-needed  basis.  Hypertension-stable -Continue home metoprololand Lasix at adjusted dose. -Vital signs stable. -Heart healthy diet  has been recommended.  AKI onCKD stage4-improved -Baseline creatinine 2.2-2.6 -Currentlyat 2.5,appreciate nephrology,patient has diuresed well after Lasix IV given. -continue lasix 60m BID now -no need for HD -Continue to to follow daily weights and Continue low-sodium diet.  Prediabetes-with steroid-induced hyperglycemia -Monitor carefully while on steroids -SSI used inside hospital -A1c 6.1% -Modified carbohydrate diet has been encouraged at time of discharge.  BPH -Continue home alfuzosin -No symptoms of urinary retention reported at time of discharge.  Anxiety -Patient appears to be more relaxed. -As needed Xanax was used for hospitalized; patient can be reassessed by his PCP as an outpatient and further anxiolytic therapy to be decided.  Class I obesity -Body mass index is 33.23 kg/m. -Low calorie diet, portion control and increase physical activity discussed with patient.  Physical deconditioning -Home health PT has been recommended and arranged prior to discharge.  Procedures:  See below for x-ray reports  Consultations:  Nephrology service  Pulmonologist.  01/23/20 Patient is here today with his family for follow-up.  He is currently wearing 6 L however he admits that he takes it off sometimes when he is resting.  I took his oxygen off and while he was sitting still, we had a conversation.  His oxygen dropped to 92 to 93% on room air at rest.  However with ambulation off oxygen, his oxygen level quickly plummeted to 86 on room air.  Ultimately the patient required 3 L of oxygen via nasal cannula to maintain oxygen saturations greater than 90% with activity.  Therefore I recommended that the patient maintain oxygen 3 L via nasal cannula with activity and at night while he sleeps.  However he does appear to be improving.  He denies  any chest pain shortness of breath or dyspnea on exertion.  He states that he cannot really tell that he needs the oxygen and is eager to get rid of it.  He is eating better now.  He denies any nausea or vomiting.  He denies any diarrhea.  He denies any dysuria.  He denies any blood in his stool.  He has completed his steroids.  At that time, my plan was: Patient appears to be improving since discharge from hospital.  He will continue oxygen via nasal cannula at 3 L with activity and while sleeping.  I will recheck the patient in 2 weeks and at that time I suspect that he will likely be able to discontinue oxygen altogether.  I will check a CBC today to monitor for any anemia and I will also check a CMP to monitor his renal function and blood sugar.  Patient is currently taking 40 mg of Lasix twice daily.  There is no pitting edema on exam and no evidence of fluid overload.  As long as lab work is normal, I will likely decrease his Lasix to 40 mg once a day.  I believe he can discontinue his fluid restriction at this time as he does not appear fluid overloaded.  Spent more than 30 minutes today with the patient and his family answering her questions.  Recheck here in 2 weeks.  Recommended postponing for his dental surgery until he is off oxygen.  02/10/20 Recently had labs (4/16).  Blood sugar 273 despite being off steroids and potassium remains low.  Liver tests have improved.  Renal fcn is stable.  I did perform a finger check blood sugar today.  He is eating 3 hours ago.  His blood sugar was over 140 but was nowhere near the  273 from Friday.  This is much more reassuring.  Patient denies any polyuria polydipsia or blurry vision.  He continues to take his fluid pill twice a day however there is no pitting edema in his extremities.  He denies any cough or orthopnea.  His oxygen is 96% on room air sitting however with activity it drops to 88% after walking 60 feet.  His breathing is better but certainly not back  to his baseline. At that time, my plan was: Breathing has improved but not yet back to baseline.  Continue oxygen 3 L with activity and recheck in 2 to 3 weeks.  Begin K-Lor 10 mEq a day for hypokalemia and reduce Lasix to 40 mg once a day to avoid dehydration.  He was previously on 40 mg once a day and was just recently increased to twice a day in the hospital.  Patient will check his fasting blood sugar and his 2-hour postprandial sugars for the next week and notify me on Friday of the values.  I am hoping that the 273 was an isolated fluke however the patient's prediabetes may have progressed to overt diabetes given his recent physical stress.  Await results of his labs and home sugar monitoring.  03/02/20 Patient is here today for a checkup.  Patient is 99% on 2 L however when he takes his oxygen off sitting at rest he is 92 to 93%.  With activity he drops to 87% on room air.  He quickly rebounds to 99% with oxygen however he finds himself becoming easily winded without oxygen with exercise.  This appears to be a new chronic baseline for this patient given his Covid that he had earlier this year.  Unfortunately the patient is trying to form.  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  This is actually creating more work for the patient that is dangerous.  He would like to have some type of portable oxygen concentrator which I believe will be entirely appropriate given the fact he still trying to work in a physically demanding job.  Otherwise he is doing well.  His blood sugars are between 120 and 130.  He is having to use Lasix 40 mg twice a day.  He has gained 5 pounds since his last visit after he cut his Lasix dose once a day.  He started noticing edema in his legs and in his face and therefore he increased it back to twice a day on his  own which I believe is appropriate. AT that time, my plan was: I believe the patient has reached a new chronic baseline regarding his hypoxia.  He requires 2 L of oxygen with activity.  At rest he does not require it however he does require the oxygen tomorrow.  Patient still works full-time as a Psychologist, sport and exercise.  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  I will try to get the patient approved for a satchel type oxygen concentrator that would be much easier for him.  Increase Lasix back to 40 mg twice a day and continue K. Dur 10 mEq a day with this.  Recheck renal function today.  Blood sugars are between 120 and 130 without medication.  I encouraged the patient to continue monitoring  this and we will recheck his lab work in 3 months.  04/07/20 Patient is here today for recheck.  Patient is not using his oxygen except with strenuous activity.  On room air, the patient is 97%.  With ambulation off oxygen, his oxygen drops to 92% after walking 120 ft.  Patient states that he checks his oxygen frequently at home.  He is not wearing his oxygen when he sleeps.  He is not wearing it if he is sitting around the house.  He will wear it if he has to walk long distances.  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  That is the only time he is using the oxygen.  He denies any fevers or chills or chest pain.  He denies any orthopnea.  He is no longer becoming easily winded with exercise.  He is now up to walking approximately 1 to 2 mi at a slow pace.   Past Medical History:  Diagnosis Date  . Anemia   . Benign localized prostatic hyperplasia with lower urinary tract symptoms (LUTS)   . CKD (chronic kidney disease), stage III    nephrologist-  dr Hinda Lenis ([facility] in [place])-- per pt last visit 06/ 2019  . Diverticulosis of colon   . ED (erectile dysfunction)   . History of acute  gouty arthritis 10/23/2017  . History of acute renal failure 10/23/2017   hospital admission-- w/ acute tubular necrosis superimposed on ckd 3--- resolved  . History of diverticulitis of colon 10/05/2017   admission-- mild-- resolved w/ medical management  . History of metabolic acidosis 26/71/2458   admission-- resolved  . History of palpitations    approx. 2009, per pt no issue since  . History of urinary retention 10/2017  . Hypertension   . Incomplete right bundle branch block   . Mixed hyperlipidemia   . Nephrolithiasis    left side nonobstructive  . Pre-diabetes   . Right ureteral stone   . Wears dentures    upper    Past Surgical History:  Procedure Laterality Date  . ANKLE SURGERY Bilateral 1990s to early 2000s   removal bone spurs  . APPENDECTOMY  child  . COLONOSCOPY N/A 10/27/2017   Procedure: COLONOSCOPY;  Surgeon: Rogene Houston, MD;  Location: [facility];  Service: Endoscopy;  Laterality: N/A;  . CYSTOSCOPY/URETEROSCOPY/HOLMIUM LASER/STENT PLACEMENT Right 05/09/2018   Procedure: CYSTOSCOPY RIGHT URETEROSCOPY/HOLMIUM LASER/STENT PLACEMENT;  Surgeon: Ardis Hughs, MD;  Location: [facility];  Service: Urology;  Laterality: Right;  . HEMATOMA EVACUATION  1990s   right calf  . INGUINAL HERNIA REPAIR Bilateral age 76s  . PLANTAR FASCIA SURGERY Left 2004  . TONSILLECTOMY  child  . TRANSTHORACIC ECHOCARDIOGRAM  10/24/2017   ef 60-65%,  grade 1 diastolic dysfunction   Current Outpatient Medications on File Prior to Visit  Medication Sig Dispense Refill  . Accu-Chek Softclix Lancets lancets Check bs bid DX: R73.03 100 each 12  . albuterol (VENTOLIN HFA) 108 (90 Base) MCG/ACT inhaler Inhale 2 puffs into the lungs 3 (three) times daily. 8 g 1  . alfuzosin (UROXATRAL) 10 MG 24 hr tablet Take 1 tablet (10 mg total) by mouth daily with breakfast. 90 tablet 3  . ascorbic acid (VITAMIN C) 500 MG tablet Take 1 tablet (500 mg total) by mouth daily. 30  tablet 1  . Blood Glucose Monitoring Suppl (ACCU-CHEK GUIDE)  w/Device KIT 1 Units by Does not apply route in the morning and at bedtime. 1 kit 0  . furosemide (LASIX) 40 MG tablet Take 1 tablet (40 mg total) by mouth 2 (two) times daily. 180 tablet 3  . glucose blood (ACCU-CHEK GUIDE) test strip Check bs bid DX: R73.03 100 each 3  . Lancets Misc. (ACCU-CHEK SOFTCLIX LANCET DEV) KIT Check bs bid DX: R73.03 1 kit 0  . metoprolol succinate (TOPROL-XL) 25 MG 24 hr tablet Take 0.5 tablets (12.5 mg total) by mouth daily. 60 tablet 2  . Multiple Vitamin (MULTIVITAMIN WITH MINERALS) TABS tablet Take 1 tablet by mouth daily. 30 tablet 1  . OXYGEN Inhale 3 L into the lungs. Continous     . potassium chloride SA (KLOR-CON) 20 MEQ tablet Take 1 tablet (20 mEq total) by mouth daily. 90 tablet 3   No current facility-administered medications on file prior to visit.   Allergies  Allergen Reactions  . Sulfa Antibiotics Hives and Shortness Of Breath  . Crestor [Rosuvastatin Calcium] Other (See Comments)    Leg cramps  . Lipitor [Atorvastatin Calcium] Other (See Comments)    Leg cramps  . Tetanus Toxoids Other (See Comments)    Unknown reaction   Social History   Socioeconomic History  . Marital status: Single    Spouse name: Not on file  . Number of children: Not on file  . Years of education: Not on file  . Highest education level: Not on file  Occupational History  . Not on file  Tobacco Use  . Smoking status: Former Smoker    Years: 20.00    Types: Cigarettes    Quit date: 05/04/2006    Years since quitting: 13.9  . Smokeless tobacco: Former Systems developer    Types: Chew    Quit date: 05/04/2017  Vaping Use  . Vaping Use: Never used  Substance and Sexual Activity  . Alcohol use: No  . Drug use: No  . Sexual activity: Yes  Other Topics Concern  . Not on file  Social History Narrative   Married.   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   Social Determinants of Health   Financial Resource Strain:   . Difficulty of  Paying Living Expenses:   Food Insecurity:   . Worried About Charity fundraiser in the Last Year:   . Arboriculturist in the Last Year:   Transportation Needs:   . Film/video editor (Medical):   Marland Kitchen Lack of Transportation (Non-Medical):   Physical Activity:   . Days of Exercise per Week:   . Minutes of Exercise per Session:   Stress:   . Feeling of Stress :   Social Connections:   . Frequency of Communication with Friends and Family:   . Frequency of Social Gatherings with Friends and Family:   . Attends Religious Services:   . Active Member of Clubs or Organizations:   . Attends Archivist Meetings:   Marland Kitchen Marital Status:   Intimate Partner Violence:   . Fear of Current or Ex-Partner:   . Emotionally Abused:   Marland Kitchen Physically Abused:   . Sexually Abused:    Family History  Problem Relation Age of Onset  . Thyroid cancer Mother   . Heart disease Father        MI  . Colon cancer Father   . Melanoma Father   . Benign prostatic hyperplasia Brother   . Diabetes Mellitus II Maternal Grandmother   . Ovarian cancer Daughter  Review of Systems  All other systems reviewed and are negative.      Objective:   Physical Exam Vitals reviewed.  Constitutional:      General: He is not in acute distress.    Appearance: Normal appearance. He is normal weight. He is not ill-appearing, toxic-appearing or diaphoretic.  HENT:     Head: Normocephalic and atraumatic.     Right Ear: Tympanic membrane, ear canal and external ear normal.     Left Ear: Tympanic membrane, ear canal and external ear normal.     Nose: Nose normal. No congestion or rhinorrhea.     Mouth/Throat:     Mouth: Mucous membranes are moist.     Pharynx: No oropharyngeal exudate or posterior oropharyngeal erythema.  Eyes:     General: No scleral icterus.       Right eye: No discharge.        Left eye: No discharge.     Extraocular Movements: Extraocular movements intact.     Conjunctiva/sclera:  Conjunctivae normal.     Pupils: Pupils are equal, round, and reactive to light.  Neck:     Vascular: No carotid bruit.  Cardiovascular:     Rate and Rhythm: Normal rate and regular rhythm.     Pulses: Normal pulses.     Heart sounds: Normal heart sounds. No murmur heard.  No friction rub. No gallop.   Pulmonary:     Effort: Pulmonary effort is normal. No respiratory distress.     Breath sounds: No stridor. No wheezing, rhonchi or rales.  Chest:     Chest wall: No tenderness.  Abdominal:     General: Abdomen is flat. Bowel sounds are normal. There is no distension.     Palpations: Abdomen is soft. There is no mass.     Tenderness: There is no abdominal tenderness. There is no right CVA tenderness, left CVA tenderness, guarding or rebound.     Hernia: No hernia is present.  Musculoskeletal:        General: No swelling or tenderness. Normal range of motion.     Cervical back: Normal range of motion and neck supple. No rigidity. No muscular tenderness.     Right lower leg: No edema.     Left lower leg: No edema.  Lymphadenopathy:     Cervical: No cervical adenopathy.  Skin:    General: Skin is warm.     Coloration: Skin is not jaundiced or pale.     Findings: No bruising, erythema, lesion or rash.  Neurological:     General: No focal deficit present.     Mental Status: He is alert and oriented to person, place, and time. Mental status is at baseline.     Cranial Nerves: No cranial nerve deficit.     Motor: No weakness.     Coordination: Coordination normal.     Gait: Gait normal.     Deep Tendon Reflexes: Reflexes normal.  Psychiatric:        Mood and Affect: Mood normal.        Behavior: Behavior normal.        Thought Content: Thought content normal.        Judgment: Judgment normal.         Assessment & Plan:  Chronic respiratory failure with hypoxia [facility])  Patient is doing much better now.  I do not feel that he needs oxygen any longer except with strenuous  activity.  He will continue to wean himself away  from the oxygen I suspect within the next 4 weeks he will no longer require it at all.  Follow-up in 3 months for lab work.

## 2020-05-25 DIAGNOSIS — Z23 Encounter for immunization: Secondary | ICD-10-CM | POA: Diagnosis not present

## 2020-06-18 ENCOUNTER — Other Ambulatory Visit: Payer: Medicare Other

## 2020-06-18 ENCOUNTER — Other Ambulatory Visit: Payer: Self-pay | Admitting: Family Medicine

## 2020-06-18 ENCOUNTER — Other Ambulatory Visit: Payer: Self-pay

## 2020-06-18 DIAGNOSIS — E785 Hyperlipidemia, unspecified: Secondary | ICD-10-CM | POA: Diagnosis not present

## 2020-06-18 DIAGNOSIS — R7303 Prediabetes: Secondary | ICD-10-CM | POA: Diagnosis not present

## 2020-06-18 DIAGNOSIS — I10 Essential (primary) hypertension: Secondary | ICD-10-CM

## 2020-06-18 DIAGNOSIS — N1832 Chronic kidney disease, stage 3b: Secondary | ICD-10-CM

## 2020-06-18 DIAGNOSIS — N184 Chronic kidney disease, stage 4 (severe): Secondary | ICD-10-CM | POA: Diagnosis not present

## 2020-06-18 DIAGNOSIS — N179 Acute kidney failure, unspecified: Secondary | ICD-10-CM

## 2020-06-18 MED ORDER — METOPROLOL SUCCINATE ER 25 MG PO TB24
12.5000 mg | ORAL_TABLET | Freq: Every day | ORAL | 2 refills | Status: DC
Start: 2020-06-18 — End: 2020-06-19

## 2020-06-18 NOTE — Telephone Encounter (Signed)
Prescription sent to pharmacy.

## 2020-06-18 NOTE — Telephone Encounter (Signed)
CB# Refill Toprol

## 2020-06-19 ENCOUNTER — Ambulatory Visit (INDEPENDENT_AMBULATORY_CARE_PROVIDER_SITE_OTHER): Payer: Medicare Other | Admitting: Family Medicine

## 2020-06-19 VITALS — BP 130/80 | HR 79 | Temp 97.2°F | Ht 66.0 in | Wt 193.0 lb

## 2020-06-19 DIAGNOSIS — I10 Essential (primary) hypertension: Secondary | ICD-10-CM

## 2020-06-19 DIAGNOSIS — N1832 Chronic kidney disease, stage 3b: Secondary | ICD-10-CM | POA: Diagnosis not present

## 2020-06-19 DIAGNOSIS — R7303 Prediabetes: Secondary | ICD-10-CM | POA: Diagnosis not present

## 2020-06-19 DIAGNOSIS — E785 Hyperlipidemia, unspecified: Secondary | ICD-10-CM

## 2020-06-19 LAB — COMPLETE METABOLIC PANEL WITH GFR
AG Ratio: 1.6 (calc) (ref 1.0–2.5)
ALT: 18 U/L (ref 9–46)
AST: 17 U/L (ref 10–35)
Albumin: 4.2 g/dL (ref 3.6–5.1)
Alkaline phosphatase (APISO): 77 U/L (ref 35–144)
BUN/Creatinine Ratio: 12 (calc) (ref 6–22)
BUN: 25 mg/dL (ref 7–25)
CO2: 25 mmol/L (ref 20–32)
Calcium: 9.7 mg/dL (ref 8.6–10.3)
Chloride: 107 mmol/L (ref 98–110)
Creat: 2.06 mg/dL — ABNORMAL HIGH (ref 0.70–1.18)
GFR, Est African American: 36 mL/min/{1.73_m2} — ABNORMAL LOW (ref >=60)
GFR, Est Non African American: 31 mL/min/{1.73_m2} — ABNORMAL LOW (ref >=60)
Globulin: 2.6 g/dL (calc) (ref 1.9–3.7)
Glucose, Bld: 109 mg/dL — ABNORMAL HIGH (ref 65–99)
Potassium: 4.2 mmol/L (ref 3.5–5.3)
Sodium: 141 mmol/L (ref 135–146)
Total Bilirubin: 0.6 mg/dL (ref 0.2–1.2)
Total Protein: 6.8 g/dL (ref 6.1–8.1)

## 2020-06-19 LAB — CBC WITH DIFFERENTIAL/PLATELET
Absolute Monocytes: 520 cells/uL (ref 200–950)
Basophils Absolute: 60 cells/uL (ref 0–200)
Basophils Relative: 1.2 %
Eosinophils Absolute: 120 cells/uL (ref 15–500)
Eosinophils Relative: 2.4 %
HCT: 43.3 % (ref 38.5–50.0)
Hemoglobin: 13.9 g/dL (ref 13.2–17.1)
Lymphs Abs: 1080 cells/uL (ref 850–3900)
MCH: 26 pg — ABNORMAL LOW (ref 27.0–33.0)
MCHC: 32.1 g/dL (ref 32.0–36.0)
MCV: 80.9 fL (ref 80.0–100.0)
MPV: 10.1 fL (ref 7.5–12.5)
Monocytes Relative: 10.4 %
Neutro Abs: 3220 cells/uL (ref 1500–7800)
Neutrophils Relative %: 64.4 %
Platelets: 210 10*3/uL (ref 140–400)
RBC: 5.35 10*6/uL (ref 4.20–5.80)
RDW: 14.8 % (ref 11.0–15.0)
Total Lymphocyte: 21.6 %
WBC: 5 10*3/uL (ref 3.8–10.8)

## 2020-06-19 LAB — LIPID PANEL
Cholesterol: 204 mg/dL — ABNORMAL HIGH
HDL: 27 mg/dL — ABNORMAL LOW
LDL Cholesterol (Calc): 138 mg/dL — ABNORMAL HIGH
Non-HDL Cholesterol (Calc): 177 mg/dL — ABNORMAL HIGH
Total CHOL/HDL Ratio: 7.6 (calc) — ABNORMAL HIGH
Triglycerides: 242 mg/dL — ABNORMAL HIGH

## 2020-06-19 LAB — HEMOGLOBIN A1C
Hgb A1c MFr Bld: 5.6 % of total Hgb (ref <5.7)
Mean Plasma Glucose: 114 (calc)
eAG (mmol/L): 6.3 (calc)

## 2020-06-19 MED ORDER — METOPROLOL SUCCINATE ER 25 MG PO TB24
25.0000 mg | ORAL_TABLET | Freq: Every day | ORAL | 3 refills | Status: DC
Start: 2020-06-19 — End: 2021-03-11

## 2020-06-19 MED ORDER — PRAVASTATIN SODIUM 20 MG PO TABS: 20.0000 mg | ORAL_TABLET | Freq: Every day | ORAL | 3 refills | Status: DC

## 2020-06-19 NOTE — Progress Notes (Signed)
Subjective:    Patient ID: Mike Davila, male    DOB: May 20, 1946, 74 y.o.   MRN: [id number]  HPI  Patient continues to improve.  Since I last saw him, he is stopped all oxygen.  He is working full-time as a Psychologist, sport and exercise although he admits that he has to take his time and work at a slower pace.  He denies any cough.  He denies any chest pain.  He denies any dyspnea on exertion.  Recently he received his first dose of the Covid vaccine.  His blood pressure after that increased and was consistently greater than 140/90.  Therefore he increase his metoprolol on his own back to 25 mg a day.  This was the dose that he was taking prior to his Covid hospitalization.  Is quite possible that the patient has recovered in strength and stamina enough that his blood pressure is now starting to increase.  He denies any increased salt intake.  He is not swelling.  His weight is actually down 2 pounds from his last visit.  His most recent lab work is listed below.  His prediabetes has improved dramatically and his A1c is down to 5.6.  Unfortunately his cholesterol is elevated.  He has a history of statin intolerance to Crestor and Lipitor due to muscle aches. Lab on 06/18/2020  Component Date Value Ref Range Status  . WBC 06/18/2020 5.0  3.8 - 10.8 Thousand/uL Final  . RBC 06/18/2020 5.35  4.20 - 5.80 Million/uL Final  . Hemoglobin 06/18/2020 13.9  13.2 - 17.1 g/dL Final  . HCT 06/18/2020 43.3  38 - 50 % Final  . MCV 06/18/2020 80.9  80.0 - 100.0 fL Final  . MCH 06/18/2020 26.0* 27.0 - 33.0 pg Final  . MCHC 06/18/2020 32.1  32.0 - 36.0 g/dL Final  . RDW 06/18/2020 14.8  11.0 - 15.0 % Final  . Platelets 06/18/2020 210  140 - 400 Thousand/uL Final  . MPV 06/18/2020 10.1  7.5 - 12.5 fL Final  . Neutro Abs 06/18/2020 3,220  1,500 - 7,800 cells/uL Final  . Lymphs Abs 06/18/2020 1,080  850 - 3,900 cells/uL Final  . Absolute Monocytes 06/18/2020 520  200 - 950 cells/uL Final  . Eosinophils Absolute 06/18/2020 120  15  - 500 cells/uL Final  . Basophils Absolute 06/18/2020 60  0 - 200 cells/uL Final  . Neutrophils Relative % 06/18/2020 64.4  % Final  . Total Lymphocyte 06/18/2020 21.6  % Final  . Monocytes Relative 06/18/2020 10.4  % Final  . Eosinophils Relative 06/18/2020 2.4  % Final  . Basophils Relative 06/18/2020 1.2  % Final  . Glucose, Bld 06/18/2020 109* 65 - 99 mg/dL Final   Comment: .            Fasting reference interval . For someone without known diabetes, a glucose value between 100 and 125 mg/dL is consistent with prediabetes and should be confirmed with a follow-up test. .   . BUN 06/18/2020 25  7 - 25 mg/dL Final  . Creat 06/18/2020 2.06* 0.70 - 1.18 mg/dL Final   Comment: For patients >43 years of age, the reference limit for Creatinine is approximately 13% higher for people identified as African-American. .   . GFR, Est Non African American 06/18/2020 31* > OR = 60 mL/min/1.3m Final  . GFR, Est African American 06/18/2020 36* > OR = 60 mL/min/1.724mFinal  . BUN/Creatinine Ratio 06/18/2020 12  6 - 22 (calc) Final  . Sodium 06/18/2020  141  135 - 146 mmol/L Final  . Potassium 06/18/2020 4.2  3.5 - 5.3 mmol/L Final  . Chloride 06/18/2020 107  98 - 110 mmol/L Final  . CO2 06/18/2020 25  20 - 32 mmol/L Final  . Calcium 06/18/2020 9.7  8.6 - 10.3 mg/dL Final  . Total Protein 06/18/2020 6.8  6.1 - 8.1 g/dL Final  . Albumin 06/18/2020 4.2  3.6 - 5.1 g/dL Final  . Globulin 06/18/2020 2.6  1.9 - 3.7 g/dL (calc) Final  . AG Ratio 06/18/2020 1.6  1.0 - 2.5 (calc) Final  . Total Bilirubin 06/18/2020 0.6  0.2 - 1.2 mg/dL Final  . Alkaline phosphatase (APISO) 06/18/2020 77  35 - 144 U/L Final  . AST 06/18/2020 17  10 - 35 U/L Final  . ALT 06/18/2020 18  9 - 46 U/L Final  . Hgb A1c MFr Bld 06/18/2020 5.6  <5.7 % of total Hgb Final   Comment: For the purpose of screening for the presence of diabetes: . <5.7%       Consistent with the absence of diabetes 5.7-6.4%    Consistent with  increased risk for diabetes             (prediabetes) > or =6.5%  Consistent with diabetes . This assay result is consistent with a decreased risk of diabetes. . Currently, no consensus exists regarding use of hemoglobin A1c for diagnosis of diabetes in children. . According to American Diabetes Association (ADA) guidelines, hemoglobin A1c <7.0% represents optimal control in non-pregnant diabetic patients. Different metrics may apply to specific patient populations.  Standards of Medical Care in Diabetes(ADA). .   . Mean Plasma Glucose 06/18/2020 114  (calc) Final  . eAG (mmol/L) 06/18/2020 6.3  (calc) Final  . Cholesterol 06/18/2020 204* <200 mg/dL Final  . HDL 06/18/2020 27* > OR = 40 mg/dL Final  . Triglycerides 06/18/2020 242* <150 mg/dL Final   Comment: . If a non-fasting specimen was collected, consider repeat triglyceride testing on a fasting specimen if clinically indicated.  [name] et al. J. of Clin. Lipidol. 7846;9:629-528. .   . LDL Cholesterol (Calc) 06/18/2020 138* mg/dL (calc) Final   Comment: Reference range: <100 . Desirable range <100 mg/dL for primary prevention;   <70 mg/dL for patients with CHD or diabetic patients  with > or = 2 CHD risk factors. Marland Kitchen LDL-C is now calculated using the Martin-Hopkins  calculation, which is a validated novel method providing  better accuracy than the Friedewald equation in the  estimation of LDL-C.  Cresenciano Genre et al. Annamaria Helling. 4132;440(10): 2061-2068  (http://education.[facility].com/faq/FAQ164)   . Total CHOL/HDL Ratio 06/18/2020 7.6* <5.0 (calc) Final  . Non-HDL Cholesterol (Calc) 06/18/2020 177* <130 mg/dL (calc) Final   Comment: For patients with diabetes plus 1 major ASCVD risk  factor, treating to a non-HDL-C goal of <100 mg/dL  (LDL-C of <70 mg/dL) is considered a therapeutic  option.     Past Medical History:  Diagnosis Date  . Anemia   . Benign localized prostatic hyperplasia with lower urinary tract  symptoms (LUTS)   . CKD (chronic kidney disease), stage III    nephrologist-  dr Mike Davila (Mike Davila in [place])-- per pt last visit 06/ 2019  . Diverticulosis of colon   . ED (erectile dysfunction)   . History of acute gouty arthritis 10/23/2017  . History of acute renal failure 10/23/2017   hospital admission-- w/ acute tubular necrosis superimposed on ckd 3--- resolved  . History of diverticulitis of colon 10/05/2017  admission-- mild-- resolved w/ medical management  . History of metabolic acidosis 76/72/0947   admission-- resolved  . History of palpitations    approx. 2009, per pt no issue since  . History of urinary retention 10/2017  . Hypertension   . Incomplete right bundle branch block   . Mixed hyperlipidemia   . Nephrolithiasis    left side nonobstructive  . Pre-diabetes   . Right ureteral stone   . Wears dentures    upper    Past Surgical History:  Procedure Laterality Date  . ANKLE SURGERY Bilateral 1990s to early 2000s   removal bone spurs  . APPENDECTOMY  child  . COLONOSCOPY N/A 10/27/2017   Procedure: COLONOSCOPY;  Surgeon: Rogene Houston, MD;  Location: [facility];  Service: Endoscopy;  Laterality: N/A;  . CYSTOSCOPY/URETEROSCOPY/HOLMIUM LASER/STENT PLACEMENT Right 05/09/2018   Procedure: CYSTOSCOPY RIGHT URETEROSCOPY/HOLMIUM LASER/STENT PLACEMENT;  Surgeon: Ardis Hughs, MD;  Location: [facility];  Service: Urology;  Laterality: Right;  . HEMATOMA EVACUATION  1990s   right calf  . INGUINAL HERNIA REPAIR Bilateral age 67s  . PLANTAR FASCIA SURGERY Left 2004  . TONSILLECTOMY  child  . TRANSTHORACIC ECHOCARDIOGRAM  10/24/2017   ef 60-65%,  grade 1 diastolic dysfunction   Current Outpatient Medications on File Prior to Visit  Medication Sig Dispense Refill  . Accu-Chek Softclix Lancets lancets Check bs bid DX: R73.03 100 each 12  . albuterol (VENTOLIN HFA) 108 (90 Base) MCG/ACT inhaler Inhale 2 puffs into the lungs 3 (three)  times daily. 8 g 1  . alfuzosin (UROXATRAL) 10 MG 24 hr tablet Take 1 tablet (10 mg total) by mouth daily with breakfast. 90 tablet 3  . ascorbic acid (VITAMIN C) 500 MG tablet Take 1 tablet (500 mg total) by mouth daily. 30 tablet 1  . Blood Glucose Monitoring Suppl (ACCU-CHEK GUIDE) w/Device KIT 1 Units by Does not apply route in the morning and at bedtime. 1 kit 0  . furosemide (LASIX) 40 MG tablet Take 1 tablet (40 mg total) by mouth 2 (two) times daily. 180 tablet 3  . glucose blood (ACCU-CHEK GUIDE) test strip Check bs bid DX: R73.03 100 each 3  . Lancets Misc. (ACCU-CHEK SOFTCLIX LANCET DEV) KIT Check bs bid DX: R73.03 1 kit 0  . Multiple Vitamin (MULTIVITAMIN WITH MINERALS) TABS tablet Take 1 tablet by mouth daily. 30 tablet 1  . OXYGEN Inhale 3 L into the lungs. Continous     . potassium chloride SA (KLOR-CON) 20 MEQ tablet Take 1 tablet (20 mEq total) by mouth daily. 90 tablet 3   No current facility-administered medications on file prior to visit.   Allergies  Allergen Reactions  . Sulfa Antibiotics Hives and Shortness Of Breath  . Crestor [Rosuvastatin Calcium] Other (See Comments)    Leg cramps  . Lipitor [Atorvastatin Calcium] Other (See Comments)    Leg cramps  . Tetanus Toxoids Other (See Comments)    Unknown reaction   Social History   Socioeconomic History  . Marital status: Single    Spouse name: Not on file  . Number of children: Not on file  . Years of education: Not on file  . Highest education level: Not on file  Occupational History  . Not on file  Tobacco Use  . Smoking status: Former Smoker    Years: 20.00    Types: Cigarettes    Quit date: 05/04/2006    Years since quitting: 14.1  . Smokeless tobacco: Former  User    Types: Sarina Ser    Quit date: 05/04/2017  Vaping Use  . Vaping Use: Never used  Substance and Sexual Activity  . Alcohol use: No  . Drug use: No  . Sexual activity: Yes  Other Topics Concern  . Not on file  Social History Narrative    Married.   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   Social Determinants of Health   Financial Resource Strain:   . Difficulty of Paying Living Expenses: Not on file  Food Insecurity:   . Worried About Charity fundraiser in the Last Year: Not on file  . Ran Out of Food in the Last Year: Not on file  Transportation Needs:   . Lack of Transportation (Medical): Not on file  . Lack of Transportation (Non-Medical): Not on file  Physical Activity:   . Days of Exercise per Week: Not on file  . Minutes of Exercise per Session: Not on file  Stress:   . Feeling of Stress : Not on file  Social Connections:   . Frequency of Communication with Friends and Family: Not on file  . Frequency of Social Gatherings with Friends and Family: Not on file  . Attends Religious Services: Not on file  . Active Member of Clubs or Organizations: Not on file  . Attends Archivist Meetings: Not on file  . Marital Status: Not on file  Intimate Partner Violence:   . Fear of Current or Ex-Partner: Not on file  . Emotionally Abused: Not on file  . Physically Abused: Not on file  . Sexually Abused: Not on file   Family History  Problem Relation Age of Onset  . Thyroid cancer Mother   . Heart disease Father        MI  . Colon cancer Father   . Melanoma Father   . Benign prostatic hyperplasia Brother   . Diabetes Mellitus II Maternal Grandmother   . Ovarian cancer Daughter     Review of Systems  All other systems reviewed and are negative.      Objective:   Physical Exam Vitals reviewed.  Constitutional:      General: He is not in acute distress.    Appearance: Normal appearance. He is normal weight. He is not ill-appearing, toxic-appearing or diaphoretic.  HENT:     Head: Normocephalic and atraumatic.     Right Ear: Tympanic membrane, ear canal and external ear normal.     Left Ear: Tympanic membrane, ear canal and external ear normal.     Nose: Nose normal. No congestion or rhinorrhea.     Mouth/Throat:      Mouth: Mucous membranes are moist.     Pharynx: No oropharyngeal exudate or posterior oropharyngeal erythema.  Eyes:     General: No scleral icterus.       Right eye: No discharge.        Left eye: No discharge.     Extraocular Movements: Extraocular movements intact.     Conjunctiva/sclera: Conjunctivae normal.     Pupils: Pupils are equal, round, and reactive to light.  Neck:     Vascular: No carotid bruit.  Cardiovascular:     Rate and Rhythm: Normal rate and regular rhythm.     Pulses: Normal pulses.     Heart sounds: Normal heart sounds. No murmur heard.  No friction rub. No gallop.   Pulmonary:     Effort: Pulmonary effort is normal. No respiratory distress.     Breath sounds: No  stridor. No wheezing, rhonchi or rales.  Chest:     Chest wall: No tenderness.  Abdominal:     General: Abdomen is flat. Bowel sounds are normal. There is no distension.     Palpations: Abdomen is soft. There is no mass.     Tenderness: There is no abdominal tenderness. There is no right CVA tenderness, left CVA tenderness, guarding or rebound.     Hernia: No hernia is present.  Musculoskeletal:        General: No swelling or tenderness. Normal range of motion.     Cervical back: Normal range of motion and neck supple. No rigidity. No muscular tenderness.     Right lower leg: No edema.     Left lower leg: No edema.  Lymphadenopathy:     Cervical: No cervical adenopathy.  Skin:    General: Skin is warm.     Coloration: Skin is not jaundiced or pale.     Findings: No bruising, erythema, lesion or rash.  Neurological:     General: No focal deficit present.     Mental Status: He is alert and oriented to person, place, and time. Mental status is at baseline.     Cranial Nerves: No cranial nerve deficit.     Motor: No weakness.     Coordination: Coordination normal.     Gait: Gait normal.     Deep Tendon Reflexes: Reflexes normal.  Psychiatric:        Mood and Affect: Mood normal.         Behavior: Behavior normal.        Thought Content: Thought content normal.        Judgment: Judgment normal.         Assessment & Plan:  Stage 3b chronic kidney disease  Prediabetes  Hyperlipidemia, unspecified hyperlipidemia type  Essential hypertension  I am very happy to see how well the patient is doing.  He is now off oxygen.  His blood pressures well controlled today.  I did encourage him to continue Toprol-XL 25 mg a day.  His prediabetes is excellent with a hemoglobin A1c of 5.6.  The only issue I see is his kidney function is slightly worse.  I anticipate that this is likely due to mild dehydration given the heat and the fact that he works outside as a Psychologist, sport and exercise.  I encouraged him to increase his fluid intake.  His cholesterol is also slightly elevated.  Together we have decided to try pravastatin 20 mg a day and recheck lab work in 6 months

## 2020-06-24 DIAGNOSIS — Z23 Encounter for immunization: Secondary | ICD-10-CM | POA: Diagnosis not present

## 2020-07-08 ENCOUNTER — Telehealth: Payer: Self-pay

## 2020-07-08 ENCOUNTER — Telehealth: Payer: Self-pay | Admitting: Family Medicine

## 2020-07-08 NOTE — Telephone Encounter (Signed)
Pt notified. Pt states he is taking 25 mg metoprolol and was advised that he can take 50 mg daily until seen by Dr. Dennard Schaumann. Appt has been scheduled.

## 2020-07-08 NOTE — Telephone Encounter (Signed)
Please verify the meds He should be on Metoprolol 25mg  XR (Toprol) once a day, per the notes If he is taking once a day, he can take 2 ( 50mg ) daily and schedule appt with Dr. Dennard Schaumann

## 2020-07-08 NOTE — Telephone Encounter (Signed)
Pt called to report that at 8am, pt's bp was 168/106. Pt states he took his meds and at 11am, bp was 158/97. Pt would like to know if pt should take 2 metoprolol twice daily. Dr. Dennard Schaumann advised pt to call if pt's bp gets over 140. Please advise.

## 2020-07-08 NOTE — Telephone Encounter (Signed)
error 

## 2020-07-13 DIAGNOSIS — N400 Enlarged prostate without lower urinary tract symptoms: Secondary | ICD-10-CM | POA: Diagnosis not present

## 2020-07-13 DIAGNOSIS — I129 Hypertensive chronic kidney disease with stage 1 through stage 4 chronic kidney disease, or unspecified chronic kidney disease: Secondary | ICD-10-CM | POA: Diagnosis not present

## 2020-07-13 DIAGNOSIS — N184 Chronic kidney disease, stage 4 (severe): Secondary | ICD-10-CM | POA: Diagnosis not present

## 2020-07-13 DIAGNOSIS — R7303 Prediabetes: Secondary | ICD-10-CM | POA: Diagnosis not present

## 2020-07-14 ENCOUNTER — Other Ambulatory Visit: Payer: Self-pay

## 2020-07-14 ENCOUNTER — Ambulatory Visit (INDEPENDENT_AMBULATORY_CARE_PROVIDER_SITE_OTHER): Payer: Medicare Other | Admitting: Family Medicine

## 2020-07-14 VITALS — BP 170/98 | HR 56 | Temp 97.6°F | Ht 66.0 in | Wt 196.0 lb

## 2020-07-14 DIAGNOSIS — N1832 Chronic kidney disease, stage 3b: Secondary | ICD-10-CM

## 2020-07-14 DIAGNOSIS — I10 Essential (primary) hypertension: Secondary | ICD-10-CM | POA: Diagnosis not present

## 2020-07-14 MED ORDER — AMLODIPINE BESYLATE 10 MG PO TABS: 10.0000 mg | ORAL_TABLET | Freq: Every day | ORAL | 3 refills | Status: DC

## 2020-07-14 NOTE — Progress Notes (Signed)
Subjective:    Patient ID: Mike Davila, male    DOB: Mar 17, 1946, 74 y.o.   MRN: [id number]  HPI 06/19/20 Patient continues to improve.  Since I last saw him, he is stopped all oxygen.  He is working full-time as a Psychologist, sport and exercise although he admits that he has to take his time and work at a slower pace.  He denies any cough.  He denies any chest pain.  He denies any dyspnea on exertion.  Recently he received his first dose of the Covid vaccine.  His blood pressure after that increased and was consistently greater than 140/90.  Therefore he increase his metoprolol on his own back to 25 mg a day.  This was the dose that he was taking prior to his Covid hospitalization.  Is quite possible that the patient has recovered in strength and stamina enough that his blood pressure is now starting to increase.  He denies any increased salt intake.  He is not swelling.  His weight is actually down 2 pounds from his last visit.  His most recent lab work is listed below.  His prediabetes has improved dramatically and his A1c is down to 5.6.  Unfortunately his cholesterol is elevated.  He has a history of statin intolerance to Crestor and Lipitor due to muscle aches.  At that time, my plan was: I am very happy to see how well the patient is doing.  He is now off oxygen.  His blood pressures well controlled today.  I did encourage him to continue Toprol-XL 25 mg a day.  His prediabetes is excellent with a hemoglobin A1c of 5.6.  The only issue I see is his kidney function is slightly worse.  I anticipate that this is likely due to mild dehydration given the heat and the fact that he works outside as a Psychologist, sport and exercise.  I encouraged him to increase his fluid intake.  His cholesterol is also slightly elevated.  Together we have decided to try pravastatin 20 mg a day and recheck lab work in 6 months  07/14/20  Patient's blood pressure at home has been between 202 and 334 systolic.  I checked it personally here and found it to be 170/98.   He states that later in the day it will drop as low as 356-861 systolic but it is still too high.  Heart rates however are in the mid 48s.  He is taking Toprol 50 mg a day Past Medical History:  Diagnosis Date  . Anemia   . Benign localized prostatic hyperplasia with lower urinary tract symptoms (LUTS)   . CKD (chronic kidney disease), stage III    nephrologist-  dr Hinda Lenis ([facility] in [place])-- per pt last visit 06/ 2019  . Diverticulosis of colon   . ED (erectile dysfunction)   . History of acute gouty arthritis 10/23/2017  . History of acute renal failure 10/23/2017   hospital admission-- w/ acute tubular necrosis superimposed on ckd 3--- resolved  . History of diverticulitis of colon 10/05/2017   admission-- mild-- resolved w/ medical management  . History of metabolic acidosis 68/37/2902   admission-- resolved  . History of palpitations    approx. 2009, per pt no issue since  . History of urinary retention 10/2017  . Hypertension   . Incomplete right bundle branch block   . Mixed hyperlipidemia   . Nephrolithiasis    left side nonobstructive  . Pre-diabetes   . Right ureteral stone   . Wears dentures  upper    Past Surgical History:  Procedure Laterality Date  . ANKLE SURGERY Bilateral 1990s to early 2000s   removal bone spurs  . APPENDECTOMY  child  . COLONOSCOPY N/A 10/27/2017   Procedure: COLONOSCOPY;  Surgeon: Rogene Houston, MD;  Location: [facility];  Service: Endoscopy;  Laterality: N/A;  . CYSTOSCOPY/URETEROSCOPY/HOLMIUM LASER/STENT PLACEMENT Right 05/09/2018   Procedure: CYSTOSCOPY RIGHT URETEROSCOPY/HOLMIUM LASER/STENT PLACEMENT;  Surgeon: Ardis Hughs, MD;  Location: [facility];  Service: Urology;  Laterality: Right;  . HEMATOMA EVACUATION  1990s   right calf  . INGUINAL HERNIA REPAIR Bilateral age 39s  . PLANTAR FASCIA SURGERY Left 2004  . TONSILLECTOMY  child  . TRANSTHORACIC ECHOCARDIOGRAM  10/24/2017   ef 60-65%,  grade  1 diastolic dysfunction   Current Outpatient Medications on File Prior to Visit  Medication Sig Dispense Refill  . Accu-Chek Softclix Lancets lancets Check bs bid DX: R73.03 100 each 12  . albuterol (VENTOLIN HFA) 108 (90 Base) MCG/ACT inhaler Inhale 2 puffs into the lungs 3 (three) times daily. 8 g 1  . alfuzosin (UROXATRAL) 10 MG 24 hr tablet Take 1 tablet (10 mg total) by mouth daily with breakfast. 90 tablet 3  . ascorbic acid (VITAMIN C) 500 MG tablet Take 1 tablet (500 mg total) by mouth daily. 30 tablet 1  . Blood Glucose Monitoring Suppl (ACCU-CHEK GUIDE) w/Device KIT 1 Units by Does not apply route in the morning and at bedtime. 1 kit 0  . furosemide (LASIX) 40 MG tablet Take 1 tablet (40 mg total) by mouth 2 (two) times daily. 180 tablet 3  . glucose blood (ACCU-CHEK GUIDE) test strip Check bs bid DX: R73.03 100 each 3  . Lancets Misc. (ACCU-CHEK SOFTCLIX LANCET DEV) KIT Check bs bid DX: R73.03 1 kit 0  . metoprolol succinate (TOPROL-XL) 25 MG 24 hr tablet Take 1 tablet (25 mg total) by mouth daily. 90 tablet 3  . Multiple Vitamin (MULTIVITAMIN WITH MINERALS) TABS tablet Take 1 tablet by mouth daily. 30 tablet 1  . OXYGEN Inhale 3 L into the lungs. Continous     . potassium chloride SA (KLOR-CON) 20 MEQ tablet Take 1 tablet (20 mEq total) by mouth daily. 90 tablet 3  . pravastatin (PRAVACHOL) 20 MG tablet Take 1 tablet (20 mg total) by mouth daily. 90 tablet 3   No current facility-administered medications on file prior to visit.   Allergies  Allergen Reactions  . Sulfa Antibiotics Hives and Shortness Of Breath  . Crestor [Rosuvastatin Calcium] Other (See Comments)    Leg cramps  . Lipitor [Atorvastatin Calcium] Other (See Comments)    Leg cramps  . Tetanus Toxoids Other (See Comments)    Unknown reaction   Social History   Socioeconomic History  . Marital status: Single    Spouse name: Not on file  . Number of children: Not on file  . Years of education: Not on file  .  Highest education level: Not on file  Occupational History  . Not on file  Tobacco Use  . Smoking status: Former Smoker    Years: 20.00    Types: Cigarettes    Quit date: 05/04/2006    Years since quitting: 14.2  . Smokeless tobacco: Former Systems developer    Types: Chew    Quit date: 05/04/2017  Vaping Use  . Vaping Use: Never used  Substance and Sexual Activity  . Alcohol use: No  . Drug use: No  . Sexual activity:  Yes  Other Topics Concern  . Not on file  Social History Narrative   Married.   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   Social Determinants of Health   Financial Resource Strain:   . Difficulty of Paying Living Expenses: Not on file  Food Insecurity:   . Worried About Charity fundraiser in the Last Year: Not on file  . Ran Out of Food in the Last Year: Not on file  Transportation Needs:   . Lack of Transportation (Medical): Not on file  . Lack of Transportation (Non-Medical): Not on file  Physical Activity:   . Days of Exercise per Week: Not on file  . Minutes of Exercise per Session: Not on file  Stress:   . Feeling of Stress : Not on file  Social Connections:   . Frequency of Communication with Friends and Family: Not on file  . Frequency of Social Gatherings with Friends and Family: Not on file  . Attends Religious Services: Not on file  . Active Member of Clubs or Organizations: Not on file  . Attends Archivist Meetings: Not on file  . Marital Status: Not on file  Intimate Partner Violence:   . Fear of Current or Ex-Partner: Not on file  . Emotionally Abused: Not on file  . Physically Abused: Not on file  . Sexually Abused: Not on file   Family History  Problem Relation Age of Onset  . Thyroid cancer Mother   . Heart disease Father        MI  . Colon cancer Father   . Melanoma Father   . Benign prostatic hyperplasia Brother   . Diabetes Mellitus II Maternal Grandmother   . Ovarian cancer Daughter     Review of Systems  All other systems reviewed and are  negative.      Objective:   Physical Exam Vitals reviewed.  Constitutional:      General: He is not in acute distress.    Appearance: Normal appearance. He is normal weight. He is not ill-appearing, toxic-appearing or diaphoretic.  HENT:     Head: Normocephalic and atraumatic.     Right Ear: Tympanic membrane, ear canal and external ear normal.     Left Ear: Tympanic membrane, ear canal and external ear normal.     Nose: Nose normal. No congestion or rhinorrhea.     Mouth/Throat:     Mouth: Mucous membranes are moist.     Pharynx: No oropharyngeal exudate or posterior oropharyngeal erythema.  Eyes:     General: No scleral icterus.       Right eye: No discharge.        Left eye: No discharge.     Extraocular Movements: Extraocular movements intact.     Conjunctiva/sclera: Conjunctivae normal.     Pupils: Pupils are equal, round, and reactive to light.  Neck:     Vascular: No carotid bruit.  Cardiovascular:     Rate and Rhythm: Normal rate and regular rhythm.     Pulses: Normal pulses.     Heart sounds: Normal heart sounds. No murmur heard.  No friction rub. No gallop.   Pulmonary:     Effort: Pulmonary effort is normal. No respiratory distress.     Breath sounds: No stridor. No wheezing, rhonchi or rales.  Chest:     Chest wall: No tenderness.  Abdominal:     General: Abdomen is flat. Bowel sounds are normal. There is no distension.     Palpations: Abdomen is  soft. There is no mass.     Tenderness: There is no abdominal tenderness. There is no right CVA tenderness, left CVA tenderness, guarding or rebound.     Hernia: No hernia is present.  Musculoskeletal:        General: No swelling or tenderness. Normal range of motion.     Cervical back: Normal range of motion and neck supple. No rigidity. No muscular tenderness.     Right lower leg: No edema.     Left lower leg: No edema.  Lymphadenopathy:     Cervical: No cervical adenopathy.  Skin:    General: Skin is warm.      Coloration: Skin is not jaundiced or pale.     Findings: No bruising, erythema, lesion or rash.  Neurological:     General: No focal deficit present.     Mental Status: He is alert and oriented to person, place, and time. Mental status is at baseline.     Cranial Nerves: No cranial nerve deficit.     Motor: No weakness.     Coordination: Coordination normal.     Gait: Gait normal.     Deep Tendon Reflexes: Reflexes normal.  Psychiatric:        Mood and Affect: Mood normal.        Behavior: Behavior normal.        Thought Content: Thought content normal.        Judgment: Judgment normal.         Assessment & Plan:  Benign essential HTN - Plan: Microalbumin/Creatinine Ratio, Urine  Stage 3b chronic kidney disease  Due to bradycardia we cannot increase his Toprol.  I gave the patient the option between benazepril given his chronic kidney disease and amlodipine.  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  I will check a urine protein to creatinine ratio.  However the present time together we decided to start amlodipine 10 mg a day and recheck blood pressure in 2 weeks

## 2020-07-15 LAB — MICROALBUMIN / CREATININE URINE RATIO
Creatinine, Urine: 107 mg/dL (ref 20–320)
Microalb Creat Ratio: 37 mcg/mg creat — ABNORMAL HIGH (ref <30)
Microalb, Ur: 4 mg/dL

## 2020-09-28 DIAGNOSIS — C44629 Squamous cell carcinoma of skin of left upper limb, including shoulder: Secondary | ICD-10-CM | POA: Diagnosis not present

## 2020-09-28 DIAGNOSIS — C44319 Basal cell carcinoma of skin of other parts of face: Secondary | ICD-10-CM | POA: Diagnosis not present

## 2020-09-28 DIAGNOSIS — B078 Other viral warts: Secondary | ICD-10-CM | POA: Diagnosis not present

## 2020-09-28 DIAGNOSIS — L720 Epidermal cyst: Secondary | ICD-10-CM | POA: Diagnosis not present

## 2020-10-08 NOTE — Chronic Care Management (AMB) (Signed)
Chronic Care Management Pharmacy  Name: Mike Davila  MRN: [id number] DOB: 06/10/1946   Chief Complaint/ HPI  [name],  74 y.o. , male presents for their Follow-up CCM visit with the clinical pharmacist via telephone.  PCP : [name], MD  Their chronic conditions include: Hypertenstion, hyperlipidemia, pre-diabetes.  Office Visits: 07/14/2020 ([facility]) - reports home BP is 388-828M systolic.  Agreed to start amlodipine 103m daily and will recheck blood pressure in another two weeks.  06/19/2020 ([facility]) - patient working full time as fPsychologist, sport and exercise off all oxygen.  Reports elevated BP and he increased his metoprolol on his own back to 239mdaily.  Lipids have increased and A1c is controlled.  History of statin intolerance, however he agreed to try pravastatin 2051maily and will recheck labs in 6 months.  04/07/2020 ([facility]) - Patient is only using his oxygen with strenuous activity, he is weaning himself off oxygen.  Dr. PicDennard Schaumann with this plan and feels he no longer will require oxygen in a few weeks.  02/10/20 ([facility]) - Patient admitted 3/11 for COVID-19.  Started K-clor 68m60maily and decreased furosemide to 40mg61me daily to avoid dehydration.  Patient told to check FBG and PPG for the next week.  Glucose was 143.  01/23/20 ([facility]) - discussed limiting use of NSAIDs  Consult Visit  01/02/20 ([facility] - admitted d/t Pneumonia associated with COVID-19.  Given SSI d/t dexamethasone use, also given xanax and pt wants Rx prescribed regularly by PCP  Medications: Outpatient Encounter Medications as of 02/20/2020  Medication Sig Note  . Accu-Chek Softclix Lancets lancets Check bs bid DX: R73.03   . albuterol (VENTOLIN HFA) 108 (90 Base) MCG/ACT inhaler Inhale 2 puffs into the lungs 3 (three) times daily.   . alfMarland Kitchenzosin (UROXATRAL) 10 MG 24 hr tablet Take 1 tablet (10 mg total) by mouth daily with breakfast.   . ALPRAZolam (XANAX) 0.5 MG tablet Take 1  tablet (0.5 mg total) by mouth 3 (three) times daily as needed.   . ascMarland Kitchenrbic acid (VITAMIN C) 500 MG tablet Take 1 tablet (500 mg total) by mouth daily.   . atoMarland Kitchenvastatin (LIPITOR) 20 MG tablet Take 1 tablet (20 mg total) by mouth daily. (Patient not taking: Reported on 02/10/2020) 01/03/2020: Last filled 11/07/2018  . Blood Glucose Monitoring Suppl (ACCU-CHEK GUIDE) w/Device KIT 1 Units by Does not apply route in the morning and at bedtime.   . furosemide (LASIX) 40 MG tablet Take 1 tablet (40 mg total) by mouth 2 (two) times daily.   . gluMarland Kitchenose blood (ACCU-CHEK GUIDE) test strip Check bs bid DX: R73.03   . guaiFENesin-dextromethorphan (ROBITUSSIN DM) 100-10 MG/5ML syrup Take 10 mLs by mouth every 4 (four) hours as needed for cough.   . Lancets Misc. (ACCU-CHEK SOFTCLIX LANCET DEV) KIT Check bs bid DX: R73.03   . metoprolol succinate (TOPROL-XL) 25 MG 24 hr tablet Take 0.5 tablets (12.5 mg total) by mouth daily.   . Multiple Vitamin (MULTIVITAMIN WITH MINERALS) TABS tablet Take 1 tablet by mouth daily.   . OXYGEN Inhale 6 L into the lungs. Continous   . potassium chloride (KLOR-CON) 10 MEQ tablet Take 1 tablet (10 mEq total) by mouth daily.   . sodium chloride (OCEAN) 0.65 % SOLN nasal spray Place 1 spray into both nostrils as needed for congestion.   . zinMarland Kitchen sulfate 220 (50 Zn) MG capsule Take 1 capsule (220 mg total) by mouth daily.    No facility-administered encounter medications on  file as of 02/20/2020.     Current Diagnosis/Assessment:  Goals Addressed            This Visit's Progress   . Hyperlipidemia - LDL <100       CARE PLAN ENTRY (see longitudinal plan of care for additional care plan information)  Current Barriers:  . Uncontrolled hyperlipidemia, complicated by pre-diabetes, hypertension. . Current antihyperlipidemic regimen none . Previous antihyperlipidemic medications tried: pravastatin, atorvastatin, rosuvastatin . Most recent lipid panel:     Component Value  Date/Time   CHOL 204 (H) 06/18/2020 0805   TRIG 242 (H) 06/18/2020 0805   HDL 27 (L) 06/18/2020 0805   CHOLHDL 7.6 (H) 06/18/2020 0805   VLDL 71 (H) 06/15/2016 1036   LDLCALC 138 (H) 06/18/2020 0805   . ASCVD risk enhancing conditions: age >28, DM, HTN, CKD, CHF, current smoker . 10-year ASCVD risk score: 19.5%  Pharmacist Clinical Goal(s):  Marland Kitchen Over the next 120 days, patient will work with PharmD and providers towards achieving lipid goals through continuation of healthier diet.  Interventions: . Comprehensive medication review performed; medication list updated in electronic medical record.  . Continue current diet modifications. . Recommend repeat lipid panel at next f/u  Patient Self Care Activities:  . Over the next 30 days patient will continue to work on diet to promote good cholesterol levels. . Focus on medication adherence with statin.  Please see past updates related to this goal by clicking on the "Past Updates" button in the selected goal      . Hypertension - BP < 130/80       CARE PLAN ENTRY (see longitudinal plan of care for additional care plan information)  Current Barriers:  . Controlled hypertension, complicated by pre-diabetes, hyperlipidemia. . Current antihypertensive regimen: metoprolol 39m 1/2 tablet daily, amlodipine 156mdaily . Previous antihypertensives tried: benazepril . Last practice recorded BP readings:  BP Readings from Last 3 Encounters:  07/14/20 (!) 170/98  06/19/20 130/80  04/07/20 120/80     Pharmacist Clinical Goal(s):  . Marland Kitchenver the next 120 days, patient will work with PharmD and providers to optimize antihypertensive regimen  Interventions: . Inter-disciplinary care team collaboration (see longitudinal plan of care) . Comprehensive medication review performed; medication list updated in the electronic medical record.  . Continue current therapy. . Recommended feet elevation for swelling  Patient Self Care Activities:   . Patient will continue to check BP and document, and provide at future appointments . Patient will focus on medication adherence by pill count. . Notify providers if swelling hurts or does not resolve with Lasix  Please see past updates related to this goal by clicking on the "Past Updates" button in the selected goal      . Pre-Diabetes - A1c < 5.7       CARE PLAN ENTRY (see longitudinal plan of care for additional care plan information)  Current Barriers:  . Prediabetes; complicated by chronic medical conditions including hypertension, hyperlipidemia. Lab Results  Component Value Date   HGBA1C 5.6 06/18/2020 .   Lab Results  Component Value Date   CREATININE 2.06 (H) 06/18/2020   CREATININE 1.87 (H) 03/02/2020   CREATININE 2.02 (H) 02/07/2020   . Current antihyperglycemic regimen: none . Current meal patterns: patient is starting to watch carbohydrates as discussed with Dr. PiDennard Schaumannt last PCP visit. . Current exercise:works outside on farm . Cardiovascular risk reduction: o Current hypertensive regimen: metoprolol succinate 2547mne-half tablet daily o Current hyperlipidemia regimen: none o Current antiplatelet  regimen: none  Pharmacist Clinical Goal(s):  Marland Kitchen Over the next 120 days, patient will work with PharmD and primary care provider to address historical elevated blood sugars.  Interventions: . Comprehensive medication review performed, medication list updated in electronic medical record . Inter-disciplinary care team collaboration (see longitudinal plan of care) . Continue to work on dietary changes such as watching carbohydrates.  Patient Self Care Activities:  . Patient will focus on medication adherence by pill count . Patient will contact provider with any episodes of hyperglycemia . Patient will report any questions or concerns to provider   Please see past updates related to this goal by clicking on the "Past Updates" button in the selected goal           Pre-Diabetes   A1c goal < 5.7  Recent Relevant Labs: Lab Results  Component Value Date/Time   HGBA1C 5.6 06/18/2020 08:05 AM   HGBA1C 6.1 (H) 01/02/2020 03:43 PM   MICROALBUR 4.0 07/14/2020 08:22 AM    Last diabetic Eye exam: No results found for: HMDIABEYEEXA  Last diabetic Foot exam: No results found for: HMDIABFOOTEX   Checking BG: Never  Patient has failed these meds in past: none noted Patient is currently controlled on the following medications: . No medications at this time  We discussed:   Patient's most recent A1c well controlled, improved from March  Discussed limiting carbohydrates and sweets  He is very active outside around the farm  Plan  Continue control with diet and exercise   Hypertension   BP goal is:  <140/90  Office blood pressures are  BP Readings from Last 3 Encounters:  07/14/20 (!) 170/98  06/19/20 130/80  04/07/20 120/80   Patient checks BP at home daily Patient home BP readings are ranging: 120s-130s-80/90s  Patient has failed these meds in the past: none noted Patient is currently controlled on the following medications:  . Amlodipine 20m daily . Metoprolol XL 267mdaily  We discussed   He has been monitoring his blood pressure ever since the addition of amlodipine.  He reports that it has helped keep his blood pressure within normal limits.  Does reports some swelling, he is taking Lasix but is only able to take it once daily because of frequent urination on twice daily dosing.    Counseled him to elevate feet as much as possible.  He says the swelling is not painful and does go away overnight when he lays down  Tolerable at the moment, if becomes bothersome could look at switch to ACEI/ARB.  Also, monitoring urine albumin.  Currently 37, recommend recheck at next follow up.  If increased may be opportunity for ACEI/ARB for albuminuria as well as helping swelling.  Plan  Continue current medications  Monitor urine  albumin Patient to contact usKoreaf swelling becomes bothersome      Hyperlipidemia   LDL goal < 100  Last lipids Lab Results  Component Value Date   CHOL 204 (H) 06/18/2020   HDL 27 (L) 06/18/2020   LDLCALC 138 (H) 06/18/2020   TRIG 242 (H) 06/18/2020   CHOLHDL 7.6 (H) 06/18/2020   Hepatic Function Latest Ref Rng & Units 06/18/2020 02/07/2020 01/23/2020  Total Protein 6.1 - 8.1 g/dL 6.8 6.7 5.6(L)  Albumin 3.5 - 5.0 g/dL - - -  AST 10 - 35 U/L 17 34 42(H)  ALT 9 - 46 U/L 18 57(H) 113(H)  Alk Phosphatase 38 - 126 U/L - - -  Total Bilirubin 0.2 - 1.2 mg/dL  0.6 0.8 0.5     The 10-year ASCVD risk score Mikey Bussing DC Jr., et al., 2013) is: 46.4%   Values used to calculate the score:     Age: 74 years     Sex: Male     Is Non-Hispanic African American: No     Diabetic: No     Tobacco smoker: No     Systolic Blood Pressure: 932 mmHg     Is BP treated: Yes     HDL Cholesterol: 27 mg/dL     Total Cholesterol: 204 mg/dL   Patient has failed these meds in past: Crestor Patient is currently uncontrolled on the following medications:  . Pravastatin 10m daily  We discussed:    He reports adherence to medication  Discussed most recent lipid panel  Patient knows he needs to watch fatty and processed foods  Recommend recheck in 6 months at next fu visit with PCP  Plan  Continue current medications  Recheck lipids at 6 mo f/u    Medication Management   . OTC's: Zinc 557mdaily, Vit C 50068maily, MVI . Patient currently uses CarAssurantPhone #  [phone] MilchharmD Clinical Pharmacist BroLa Paloma [phone]

## 2020-10-22 ENCOUNTER — Ambulatory Visit: Payer: Medicare Other | Admitting: Pharmacist

## 2020-10-22 DIAGNOSIS — R7303 Prediabetes: Secondary | ICD-10-CM

## 2020-10-22 DIAGNOSIS — I10 Essential (primary) hypertension: Secondary | ICD-10-CM

## 2020-10-22 DIAGNOSIS — E785 Hyperlipidemia, unspecified: Secondary | ICD-10-CM

## 2020-10-22 NOTE — Patient Instructions (Addendum)
Visit Information  Goals Addressed            This Visit's Progress   . Hyperlipidemia - LDL <100       CARE PLAN ENTRY (see longitudinal plan of care for additional care plan information)  Current Barriers:  . Uncontrolled hyperlipidemia, complicated by pre-diabetes, hypertension. . Current antihyperlipidemic regimen none . Previous antihyperlipidemic medications tried: pravastatin, atorvastatin, rosuvastatin . Most recent lipid panel:     Component Value Date/Time   CHOL 204 (H) 06/18/2020 0805   TRIG 242 (H) 06/18/2020 0805   HDL 27 (L) 06/18/2020 0805   CHOLHDL 7.6 (H) 06/18/2020 0805   VLDL 71 (H) 06/15/2016 1036   LDLCALC 138 (H) 06/18/2020 0805   . ASCVD risk enhancing conditions: age >41, DM, HTN, CKD, CHF, current smoker . 10-year ASCVD risk score: 19.5%  Pharmacist Clinical Goal(s):  Marland Kitchen Over the next 120 days, patient will work with PharmD and providers towards achieving lipid goals through continuation of healthier diet.  Interventions: . Comprehensive medication review performed; medication list updated in electronic medical record.  . Continue current diet modifications. . Recommend repeat lipid panel at next f/u  Patient Self Care Activities:  . Over the next 30 days patient will continue to work on diet to promote good cholesterol levels. . Focus on medication adherence with statin.  Please see past updates related to this goal by clicking on the "Past Updates" button in the selected goal      . Hypertension - BP < 130/80       CARE PLAN ENTRY (see longitudinal plan of care for additional care plan information)  Current Barriers:  . Controlled hypertension, complicated by pre-diabetes, hyperlipidemia. . Current antihypertensive regimen: metoprolol 25mg  1/2 tablet daily, amlodipine 10mg  daily . Previous antihypertensives tried: benazepril . Last practice recorded BP readings:  BP Readings from Last 3 Encounters:  07/14/20 (!) 170/98  06/19/20 130/80   04/07/20 120/80     Pharmacist Clinical Goal(s):  Marland Kitchen Over the next 120 days, patient will work with PharmD and providers to optimize antihypertensive regimen  Interventions: . Inter-disciplinary care team collaboration (see longitudinal plan of care) . Comprehensive medication review performed; medication list updated in the electronic medical record.  . Continue current therapy. . Recommended feet elevation for swelling  Patient Self Care Activities:  . Patient will continue to check BP and document, and provide at future appointments . Patient will focus on medication adherence by pill count. . Notify providers if swelling hurts or does not resolve with Lasix  Please see past updates related to this goal by clicking on the "Past Updates" button in the selected goal      . Pre-Diabetes - A1c < 5.7       CARE PLAN ENTRY (see longitudinal plan of care for additional care plan information)  Current Barriers:  . Prediabetes; complicated by chronic medical conditions including hypertension, hyperlipidemia. Lab Results  Component Value Date   HGBA1C 5.6 06/18/2020 .   Lab Results  Component Value Date   CREATININE 2.06 (H) 06/18/2020   CREATININE 1.87 (H) 03/02/2020   CREATININE 2.02 (H) 02/07/2020   . Current antihyperglycemic regimen: none . Current meal patterns: patient is starting to watch carbohydrates as discussed with Dr. Dennard Schaumann at last PCP visit. . Current exercise:[place] farm . Cardiovascular risk reduction: o Current hypertensive regimen: metoprolol succinate 25mg  one-half tablet daily o Current hyperlipidemia regimen: none o Current antiplatelet regimen: none  Pharmacist Clinical Goal(s):  Marland Kitchen Over  the next 120 days, patient will work with PharmD and primary care provider to address historical elevated blood sugars.  Interventions: . Comprehensive medication review performed, medication list updated in electronic medical record . Inter-disciplinary  care team collaboration (see longitudinal plan of care) . Continue to work on dietary changes such as watching carbohydrates.  Patient Self Care Activities:  . Patient will focus on medication adherence by pill count . Patient will contact provider with any episodes of hyperglycemia . Patient will report any questions or concerns to provider   Please see past updates related to this goal by clicking on the "Past Updates" button in the selected goal         The patient verbalized understanding of instructions, educational materials, and care plan provided today and agreed to receive a mailed copy of patient instructions, educational materials, and care plan.   Telephone follow up appointment with pharmacy team member scheduled for: 4 months  Beverly Milch, PharmD Clinical Pharmacist [facility] [phone]   Familial Hypercholesterolemia Familial hypercholesterolemia (FH) is a genetic disorder that causes a very high level of LDL (low-density lipoprotein) cholesterol. Cholesterol is a waxy, fat-like substance that your body needs to build cells. Your body makes all the cholesterol it needs in the liver and removes extra (excess) cholesterol from the blood as needed. Excess cholesterol comes from food that you eat. In people who have FH, the body is not able to remove LDL cholesterol from the blood as it should. A high level of LDL cholesterol puts you at higher risk for narrowing and hardening of your arteries (atherosclerosis) at an early age. This raises your risk for heart disease and stroke. What are the causes? FH is passed from parent to child (inherited). FH is caused by an inherited gene defect (genetic mutation) that makes it hard for the liver to remove LDL cholesterol from the blood. The gene may be inherited from one parent or both parents. What increases the risk? You may be at higher risk for FH if:  You have a family history of the condition. If both  parents carry the genetic mutation, their children are at higher risk for a more severe form of FH, with symptoms that start at an earlier age. What are the signs or symptoms? You may have a high level of LDL cholesterol before you develop symptoms. Symptoms of FH may include:  Cholesterol nodules (xanthomas) on the cords of tissue that connect muscles to bones (tendons). Xanthomas often form on the long tendon at the back of the ankle (Achilles tendon) or on the tendons on the back of the hands.  Cholesterol deposits (xanthelasmas) under the skin of the eyelids.  A gray or blue ring around the white part of the eye (corneal arcus). Complications of FH can occur due to atherosclerosis. Atherosclerosis may cause damage to an area of the body that is not getting enough blood. Complications of FH may include:  Chest pain (angina) and shortness of breath due to narrowed or blocked arteries in the heart (coronary artery disease).  Pain and cramping in the back of the lower legs (calves) when walking (claudication).  Interruption in blood flow to the brain (stroke). This may cause: ? Loss of balance. ? Vision loss. ? Sudden weakness or numbness on one side of the body. How is this diagnosed? This condition may be diagnosed based on:  Your symptoms.  Your medical history, including any family history of FH or early coronary heart disease.  A physical exam.  A blood test to check for the genetic mutation that causes FH. Your family members may also be tested. How is this treated? There is no cure for FH, but treatment can lower LDL cholesterol levels and lower your risk for heart attack or stroke. Treatment should be started as soon as you are diagnosed. Treatment may include:  A type of medicine that lowers your cholesterol (statin). If statins do not help, your health care provider may try other kinds of cholesterol-lowering medicines. The exact combination of medicines depends on the  severity of your symptoms.  A procedure to filter LDL from your blood (apheresis). You may need this treatment if you have a severe form of FH.  Making lifestyle changes that are healthy for your heart, such as lowering the amount of fat and cholesterol in your diet. Follow these instructions at home: Lifestyle   Lose weight, if directed by your health care provider.  Follow instructions from your health care provider about eating a healthy diet. Your health care provider may recommend: ? Working with a diet and nutrition specialist (dietitian), who can help you make a healthy eating plan and help you maintain a healthy weight. ? Eating less fat and cholesterol. Avoid fatty meats, fried foods, and whole-fat dairy. ? Eating more vegetables, fruits, and whole grains. ? Limiting your intake of alcohol.  Be physically active. Ask your health care provider what type of exercise is best for you.  Do not use any products that contain nicotine or tobacco, such as cigarettes and e-cigarettes. If you need help quitting, ask your health care provider.  Work with your health care provider to manage any other conditions you have, such as high blood pressure (hypertension) or diabetes. These conditions affect your heart. General instructions  Take over-the-counter and prescription medicines only as told by your health care provider.  Keep all follow-up visits as told by your health care provider. This is important. Contact a health care provider if:  You have pain or cramps in your calf when you walk. Get help right away if:   You have sudden, unexplained discomfort in your chest, arms, back, neck, jaw, or upper body.  You have trouble breathing.  You have a sudden, severe headache with no known cause.  You have any symptoms of a stroke. "BE FAST" is an easy way to remember the main warning signs of a stroke: ? B - Balance. Signs are dizziness, sudden trouble walking, or loss of  balance. ? E - Eyes. Signs are trouble seeing or a sudden change in vision. ? F - Face. Signs are sudden weakness or numbness of the face, or the face or eyelid drooping on one side. ? A - Arms. Signs are weakness or numbness in an arm. This happens suddenly and usually on one side of the body. ? S - Speech. Signs are sudden trouble speaking, slurred speech, or trouble understanding what people say. ? T - Time. Time to call emergency services. Write down what time symptoms started. These symptoms may represent a serious problem that is an emergency. Do not wait to see if the symptoms will go away. Get medical help right away. Call your local emergency services (911 in the [place] Do not drive yourself to the hospital. Summary  Familial hypercholesterolemia (FH) is a genetic disorder that causes a very high level of LDL (low-density lipoprotein) cholesterol.  FH increases your risk for coronary heart disease and stroke at an early age.  Treatment for FH should be started as soon as you are diagnosed with the condition. Treatment is aimed at lowering your risk for complications.  Follow instructions from your health care provider about eating a healthy diet. Your health care provider may recommend eating less fat and cholesterol. This information is not intended to replace advice given to you by your health care provider. Make sure you discuss any questions you have with your health care provider. Document Revised: 09/22/2017 Document Reviewed: 03/23/2017 Elsevier Patient Education  [place].

## 2020-11-24 ENCOUNTER — Telehealth: Payer: Self-pay | Admitting: Pharmacist

## 2020-11-24 NOTE — Progress Notes (Addendum)
Chronic Care Management Pharmacy Assistant   Name: Mike Davila  MRN: [id number] DOB: 1946-08-15  Reason for Encounter: Disease State For HTN.  Patient Questions:  1.  Have you seen any other providers since your last visit? No.   2.  Any changes in your medicines or health? No.   PCP : [name], MD   Their chronic conditions include: Hypertension, hyperlipidemia, pre-diabetes.  Office Visits: None since 10/22/20  Consults: None since 10/22/20  Allergies:   Allergies  Allergen Reactions   Sulfa Antibiotics Hives and Shortness Of Breath   Crestor [Rosuvastatin Calcium] Other (See Comments)    Leg cramps   Lipitor [Atorvastatin Calcium] Other (See Comments)    Leg cramps   Tetanus Toxoids Other (See Comments)    Unknown reaction    Medications: Outpatient Encounter Medications as of 11/24/2020  Medication Sig   Accu-Chek Softclix Lancets lancets Check bs bid DX: R73.03   albuterol (VENTOLIN HFA) 108 (90 Base) MCG/ACT inhaler Inhale 2 puffs into the lungs 3 (three) times daily.   alfuzosin (UROXATRAL) 10 MG 24 hr tablet Take 1 tablet (10 mg total) by mouth daily with breakfast.   amLODipine (NORVASC) 10 MG tablet Take 1 tablet (10 mg total) by mouth daily.   ascorbic acid (VITAMIN C) 500 MG tablet Take 1 tablet (500 mg total) by mouth daily.   Blood Glucose Monitoring Suppl (ACCU-CHEK GUIDE) w/Device KIT 1 Units by Does not apply route in the morning and at bedtime.   furosemide (LASIX) 40 MG tablet Take 1 tablet (40 mg total) by mouth 2 (two) times daily.   glucose blood (ACCU-CHEK GUIDE) test strip Check bs bid DX: R73.03   Lancets Misc. (ACCU-CHEK SOFTCLIX LANCET DEV) KIT Check bs bid DX: R73.03   metoprolol succinate (TOPROL-XL) 25 MG 24 hr tablet Take 1 tablet (25 mg total) by mouth daily. (Patient taking differently: Take 50 mg by mouth daily.)   Multiple Vitamin (MULTIVITAMIN WITH MINERALS) TABS tablet Take 1 tablet by mouth daily.   OXYGEN Inhale 3  L into the lungs. Continous   potassium chloride SA (KLOR-CON) 20 MEQ tablet Take 1 tablet (20 mEq total) by mouth daily.   pravastatin (PRAVACHOL) 20 MG tablet Take 1 tablet (20 mg total) by mouth daily.   No facility-administered encounter medications on file as of 11/24/2020.    Current Diagnosis: Patient Active Problem List   Diagnosis Date Noted   Class 1 obesity due to excess calories with body mass index (BMI) of 33.0 to 33.9 in adult    Acute respiratory failure with hypoxia ([place])    Pneumonia due to COVID-19 virus 01/02/2020   CKD (chronic kidney disease), stage III ([place]) 01/02/2020   Prediabetes 01/02/2020   Acute renal failure superimposed on stage 4 chronic kidney disease ([place]) 10/28/2017   Diverticulosis of colon 10/28/2017   Weight loss, unintentional 82/51/8984   Metabolic acidosis with normal anion gap and bicarbonate losses 10/28/2017   Diverticulitis large intestine 10/24/2017   Hypercalcemia 10/24/2017   Anemia 10/23/2017   Hyperkalemia 10/23/2017   Screening for prostate cancer 12/23/2014   Hyperlipidemia    Hypertension    Hyperglycemia    Hypertriglyceridemia     Goals Addressed   None    Reviewed chart prior to disease state call. Spoke with patient regarding BP  Recent Office Vitals: BP Readings from Last 3 Encounters:  07/14/20 (!) 170/98  06/19/20 130/80  04/07/20 120/80   Pulse Readings from Last 3 Encounters:  07/14/20 (!) 56  06/19/20 79  04/07/20 78    Wt Readings from Last 3 Encounters:  07/14/20 196 lb (88.9 kg)  06/19/20 193 lb (87.5 kg)  04/07/20 196 lb (88.9 kg)     Kidney Function Lab Results  Component Value Date/Time   CREATININE 2.06 (H) 06/18/2020 08:05 AM   CREATININE 1.87 (H) 03/02/2020 10:34 AM   GFRNONAA 31 (L) 06/18/2020 08:05 AM   GFRAA 36 (L) 06/18/2020 08:05 AM    BMP Latest Ref Rng & Units 06/18/2020 03/02/2020 02/07/2020  Glucose 65 - 99 mg/dL 109(H) 130(H) 273(H)  BUN 7 - 25 mg/dL 25 25 27(H)  Creatinine  0.70 - 1.18 mg/dL 2.06(H) 1.87(H) 2.02(H)  BUN/Creat Ratio 6 - 22 (calc) _0 Sodium 135 - 146 mmol/L 141 140 137  Potassium 3.5 - 5.3 mmol/L 4.2 3.3(L) 3.1(L)  Chloride 98 - 110 mmol/L 107 103 94(L)  CO2 20 - 32 mmol/L _1 Calcium 8.6 - 10.3 mg/dL 9.7 9.7 9.9    Current antihypertensive regimen:  metoprolol 25 mg 1 tablet daily amlodipine 10 mg daily  How often are you checking your Blood Pressure? 1-2x per week   Current home BP readings: 128/79 11/24/20 AM  What recent interventions/DTPs have been made by any provider to improve Blood Pressure control since last CPP Visit: None.  Any recent hospitalizations or ED visits since last visit with CPP? No.  What diet changes have been made to improve Blood Pressure Control?  Patient stated he eats very little sweets. Patient stated he eats home cooks meal and his diet is very balanced he eats vegetables, fruits, and meats.   What exercise is being done to improve your Blood Pressure Control?  Patent stated he doesn't do any exercises. He stated he plays golf, yard work and house work.  Adherence Review: Is the patient currently on ACE/ARB medication? No.  Does the patient have >5 day gap between last estimated fill dates? No, CPP Please Check.  Patient stated he does not have any concerns at this time about his medication. Patient reminded that he needs to make a f/u appointment with PCP and get updated labs.  Follow-Up:  Pharmacist Review   Charlann Lange, RMA Clinical Pharmacist Assistant [phone]  5 minutes spent in review, coordination, and documentation.  Reviewed by: Beverly Milch, PharmD Clinical Pharmacist [facility] [phone]

## 2020-12-08 ENCOUNTER — Ambulatory Visit: Payer: Medicare Other | Admitting: Family Medicine

## 2021-01-04 ENCOUNTER — Encounter: Payer: Self-pay | Admitting: Family Medicine

## 2021-01-04 ENCOUNTER — Ambulatory Visit (INDEPENDENT_AMBULATORY_CARE_PROVIDER_SITE_OTHER): Payer: Medicare Other | Admitting: Family Medicine

## 2021-01-04 ENCOUNTER — Other Ambulatory Visit: Payer: Self-pay

## 2021-01-04 VITALS — BP 130/80 | HR 64 | Temp 98.3°F | Ht 66.0 in | Wt 201.0 lb

## 2021-01-04 DIAGNOSIS — N1832 Chronic kidney disease, stage 3b: Secondary | ICD-10-CM | POA: Diagnosis not present

## 2021-01-04 DIAGNOSIS — R7303 Prediabetes: Secondary | ICD-10-CM | POA: Diagnosis not present

## 2021-01-04 DIAGNOSIS — Z136 Encounter for screening for cardiovascular disorders: Secondary | ICD-10-CM | POA: Diagnosis not present

## 2021-01-04 DIAGNOSIS — Z13220 Encounter for screening for lipoid disorders: Secondary | ICD-10-CM | POA: Diagnosis not present

## 2021-01-04 NOTE — Progress Notes (Signed)
Subjective:    Patient ID: Mike Davila, male    DOB: [birth year], 75 y.o.   MRN: [id number]  HPI 06/19/20 Patient continues to improve.  Since I last saw him, he is stopped all oxygen.  He is working full-time as a Psychologist, sport and exercise although he admits that he has to take his time and work at a slower pace.  He denies any cough.  He denies any chest pain.  He denies any dyspnea on exertion.  Recently he received his first dose of the Covid vaccine.  His blood pressure after that increased and was consistently greater than 140/90.  Therefore he increase his metoprolol on his own back to 25 mg a day.  This was the dose that he was taking prior to his Covid hospitalization.  Is quite possible that the patient has recovered in strength and stamina enough that his blood pressure is now starting to increase.  He denies any increased salt intake.  He is not swelling.  His weight is actually down 2 pounds from his last visit.  His most recent lab work is listed below.  His prediabetes has improved dramatically and his A1c is down to 5.6.  Unfortunately his cholesterol is elevated.  He has a history of statin intolerance to Crestor and Lipitor due to muscle aches.  At that time, my plan was: I am very happy to see how well the patient is doing.  He is now off oxygen.  His blood pressures well controlled today.  I did encourage him to continue Toprol-XL 25 mg a day.  His prediabetes is excellent with a hemoglobin A1c of 5.6.  The only issue I see is his kidney function is slightly worse.  I anticipate that this is likely due to mild dehydration given the heat and the fact that he works outside as a Psychologist, sport and exercise.  I encouraged him to increase his fluid intake.  His cholesterol is also slightly elevated.  Together we have decided to try pravastatin 20 mg a day and recheck lab work in 6 months  07/14/20  Patient's blood pressure at home has been between 235 and 573 systolic.  I checked it personally here and found it to be 170/98.   He states that later in the day it will drop as low as 220-254 systolic but it is still too high.  Heart rates however are in the mid 79s.  He is taking Toprol 50 mg a day.  AT that time, my plan was: Due to bradycardia we cannot increase his Toprol.  I gave the patient the option between benazepril given his chronic kidney disease and amlodipine.  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  I will check a urine protein to creatinine ratio.  However the present time together we decided to start amlodipine 10 mg a day and recheck blood pressure in 2 weeks  01/04/21 Patient is here today for a checkup.  He states that he feels great.  He states he has not done better since the last time I saw him.  He does report swelling in his legs occasionally but he attributes this to the amlodipine.  He has a history of chronic kidney disease.  He denies any oliguria or hematuria or dysuria or urgency or frequency.  He denies any fever or chills or lower abdominal pain.  He is  due to recheck his blood sugars however he states that the highest blood sugar he has seen recently has been 119.  He is working every day and doing well.  He denies any chest pain or dyspnea on exertion.  His blood pressure today is well controlled Past Medical History:  Diagnosis Date  . Anemia   . Benign localized prostatic hyperplasia with lower urinary tract symptoms (LUTS)   . CKD (chronic kidney disease), stage III [facility])    nephrologist-  dr Hinda Lenis ([facility] in [place])-- per pt last visit 06/ 2019  . Diverticulosis of colon   . ED (erectile dysfunction)   . History of acute gouty arthritis 10/23/2017  . History of acute renal failure 10/23/2017   hospital admission-- w/ acute tubular necrosis superimposed on ckd 3--- resolved  . History of diverticulitis of colon 10/05/2017   admission--  mild-- resolved w/ medical management  . History of metabolic acidosis 16/07/9603   admission-- resolved  . History of palpitations    approx. 2009, per pt no issue since  . History of urinary retention 10/2017  . Hypertension   . Incomplete right bundle branch block   . Mixed hyperlipidemia   . Nephrolithiasis    left side nonobstructive  . Pre-diabetes   . Right ureteral stone   . Wears dentures    upper    Past Surgical History:  Procedure Laterality Date  . ANKLE SURGERY Bilateral 1990s to early 2000s   removal bone spurs  . APPENDECTOMY  child  . COLONOSCOPY N/A 10/27/2017   Procedure: COLONOSCOPY;  Surgeon: Rogene Houston, MD;  Location: [facility];  Service: Endoscopy;  Laterality: N/A;  . CYSTOSCOPY/URETEROSCOPY/HOLMIUM LASER/STENT PLACEMENT Right 05/09/2018   Procedure: CYSTOSCOPY RIGHT URETEROSCOPY/HOLMIUM LASER/STENT PLACEMENT;  Surgeon: Ardis Hughs, MD;  Location: [facility];  Service: Urology;  Laterality: Right;  . HEMATOMA EVACUATION  1990s   right calf  . INGUINAL HERNIA REPAIR Bilateral age 60s  . PLANTAR FASCIA SURGERY Left 2004  . TONSILLECTOMY  child  . TRANSTHORACIC ECHOCARDIOGRAM  10/24/2017   ef 60-65%,  grade 1 diastolic dysfunction   Current Outpatient Medications on File Prior to Visit  Medication Sig Dispense Refill  . Accu-Chek Softclix Lancets lancets Check bs bid DX: R73.03 100 each 12  . albuterol (VENTOLIN HFA) 108 (90 Base) MCG/ACT inhaler Inhale 2 puffs into the lungs 3 (three) times daily. 8 g 1  . alfuzosin (UROXATRAL) 10 MG 24 hr tablet Take 1 tablet (10 mg total) by mouth daily with breakfast. 90 tablet 3  . amLODipine (NORVASC) 10 MG tablet Take 1 tablet (10 mg total) by mouth daily. 90 tablet 3  . ascorbic acid (VITAMIN C) 500 MG tablet Take 1 tablet (500 mg total) by mouth daily. 30 tablet 1  . Blood Glucose Monitoring Suppl (ACCU-CHEK GUIDE) w/Device KIT 1 Units by Does not apply route in the morning and at  bedtime. 1 kit 0  . furosemide (LASIX) 40 MG tablet Take 1 tablet (40 mg total) by mouth 2 (two) times daily. 180 tablet 3  . glucose blood (ACCU-CHEK GUIDE) test strip Check bs bid DX: R73.03 100 each 3  . Lancets Misc. (ACCU-CHEK SOFTCLIX LANCET DEV) KIT Check bs bid DX: R73.03 1 kit 0  . metoprolol succinate (TOPROL-XL) 25 MG 24 hr tablet Take 1 tablet (25 mg total) by mouth daily. (Patient taking differently: Take 50 mg by mouth daily.) 90 tablet 3  . Multiple Vitamin (MULTIVITAMIN WITH MINERALS) TABS  tablet Take 1 tablet by mouth daily. 30 tablet 1  . OXYGEN Inhale 3 L into the lungs. Continous    . potassium chloride SA (KLOR-CON) 20 MEQ tablet Take 1 tablet (20 mEq total) by mouth daily. 90 tablet 3  . pravastatin (PRAVACHOL) 20 MG tablet Take 1 tablet (20 mg total) by mouth daily. 90 tablet 3   No current facility-administered medications on file prior to visit.   Allergies  Allergen Reactions  . Sulfa Antibiotics Hives and Shortness Of Breath  . Crestor [Rosuvastatin Calcium] Other (See Comments)    Leg cramps  . Lipitor [Atorvastatin Calcium] Other (See Comments)    Leg cramps  . Tetanus Toxoids Other (See Comments)    Unknown reaction   Social History   Socioeconomic History  . Marital status: Single    Spouse name: Not on file  . Number of children: Not on file  . Years of education: Not on file  . Highest education level: Not on file  Occupational History  . Not on file  Tobacco Use  . Smoking status: Former Smoker    Years: 20.00    Types: Cigarettes    Quit date: 05/04/2006    Years since quitting: 14.6  . Smokeless tobacco: Former Systems developer    Types: Chew    Quit date: 05/04/2017  Vaping Use  . Vaping Use: Never used  Substance and Sexual Activity  . Alcohol use: No  . Drug use: No  . Sexual activity: Yes  Other Topics Concern  . Not on file  Social History Narrative   Married.   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   Social Determinants of Health   Financial Resource Strain:  Not on file  Food Insecurity: Not on file  Transportation Needs: Not on file  Physical Activity: Not on file  Stress: Not on file  Social Connections: Not on file  Intimate Partner Violence: Not on file   Family History  Problem Relation Age of Onset  . Thyroid cancer Mother   . Heart disease Father        MI  . Colon cancer Father   . Melanoma Father   . Benign prostatic hyperplasia Brother   . Diabetes Mellitus II Maternal Grandmother   . Ovarian cancer Daughter     Review of Systems  All other systems reviewed and are negative.      Objective:   Physical Exam Vitals reviewed.  Constitutional:      General: He is not in acute distress.    Appearance: Normal appearance. He is normal weight. He is not ill-appearing, toxic-appearing or diaphoretic.  HENT:     Head: Normocephalic and atraumatic.     Right Ear: Tympanic membrane, ear canal and external ear normal.     Left Ear: Tympanic membrane, ear canal and external ear normal.     Nose: Nose normal. No congestion or rhinorrhea.     Mouth/Throat:     Mouth: Mucous membranes are moist.     Pharynx: No oropharyngeal exudate or posterior oropharyngeal erythema.  Eyes:     General: No scleral icterus.       Right eye: No discharge.        Left eye: No discharge.     Extraocular Movements: Extraocular movements intact.     Conjunctiva/sclera: Conjunctivae normal.     Pupils: Pupils are equal, round, and reactive to light.  Neck:     Vascular: No carotid bruit.  Cardiovascular:     Rate and Rhythm:  Normal rate and regular rhythm.     Pulses: Normal pulses.     Heart sounds: Normal heart sounds. No murmur heard. No friction rub. No gallop.   Pulmonary:     Effort: Pulmonary effort is normal. No respiratory distress.     Breath sounds: No stridor. No wheezing, rhonchi or rales.  Chest:     Chest wall: No tenderness.  Abdominal:     General: Abdomen is flat. Bowel sounds are normal. There is no distension.      Palpations: Abdomen is soft. There is no mass.     Tenderness: There is no abdominal tenderness. There is no right CVA tenderness, left CVA tenderness, guarding or rebound.     Hernia: No hernia is present.  Musculoskeletal:        General: No swelling or tenderness. Normal range of motion.     Cervical back: Normal range of motion and neck supple. No rigidity. No muscular tenderness.     Right lower leg: No edema.     Left lower leg: No edema.  Lymphadenopathy:     Cervical: No cervical adenopathy.  Skin:    General: Skin is warm.     Coloration: Skin is not jaundiced or pale.     Findings: No bruising, erythema, lesion or rash.  Neurological:     General: No focal deficit present.     Mental Status: He is alert and oriented to person, place, and time. Mental status is at baseline.     Cranial Nerves: No cranial nerve deficit.     Motor: No weakness.     Coordination: Coordination normal.     Gait: Gait normal.     Deep Tendon Reflexes: Reflexes normal.  Psychiatric:        Mood and Affect: Mood normal.        Behavior: Behavior normal.        Thought Content: Thought content normal.        Judgment: Judgment normal.         Assessment & Plan:  Prediabetes - Plan: CBC with Differential/Platelet, COMPLETE METABOLIC PANEL WITH GFR, Lipid panel, Hemoglobin A1c, Microalbumin, urine  Stage 3b chronic kidney disease ([place]) - Plan: CBC with Differential/Platelet, COMPLETE METABOLIC PANEL WITH GFR, Lipid panel, Hemoglobin A1c, Microalbumin, urine  Patient appears to be doing excellent.  His lungs are clear to auscultation bilaterally.  He has trace bipedal edema but is not significant.  We discussed switching his amlodipine to valsartan for both hypertension as well as renal protection.  We also discussed checking a urine microalbumin and if elevated adding Farxiga for renal protection.  Patient is hesitant to make any changes in his medication at this time.  He has an appointment to see  his nephrologist next week and he is wanting me to draw lab work to have prior to his visit which I will be happy to do.  Patient elects not to make any changes in his medication unless the swelling becomes more problematic or less his renal function is deteriorating further.  At that point he would consider switching his amlodipine to valsartan.

## 2021-01-05 ENCOUNTER — Other Ambulatory Visit: Payer: Self-pay | Admitting: *Deleted

## 2021-01-05 LAB — LIPID PANEL
Cholesterol: 181 mg/dL (ref <200)
HDL: 35 mg/dL — ABNORMAL LOW (ref >=40)
LDL Cholesterol (Calc): 113 mg/dL (calc) — ABNORMAL HIGH
Non-HDL Cholesterol (Calc): 146 mg/dL (calc) — ABNORMAL HIGH (ref <130)
Total CHOL/HDL Ratio: 5.2 (calc) — ABNORMAL HIGH (ref <5.0)
Triglycerides: 213 mg/dL — ABNORMAL HIGH (ref <150)

## 2021-01-05 LAB — COMPLETE METABOLIC PANEL WITH GFR
AG Ratio: 1.7 (calc) (ref 1.0–2.5)
ALT: 27 U/L (ref 9–46)
AST: 21 U/L (ref 10–35)
Albumin: 4.7 g/dL (ref 3.6–5.1)
Alkaline phosphatase (APISO): 81 U/L (ref 35–144)
BUN/Creatinine Ratio: 9 (calc) (ref 6–22)
BUN: 18 mg/dL (ref 7–25)
CO2: 24 mmol/L (ref 20–32)
Calcium: 9.7 mg/dL (ref 8.6–10.3)
Chloride: 102 mmol/L (ref 98–110)
Creat: 2 mg/dL — ABNORMAL HIGH (ref 0.70–1.18)
GFR, Est African American: 37 mL/min/{1.73_m2} — ABNORMAL LOW (ref >=60)
GFR, Est Non African American: 32 mL/min/{1.73_m2} — ABNORMAL LOW (ref >=60)
Globulin: 2.7 g/dL (calc) (ref 1.9–3.7)
Glucose, Bld: 122 mg/dL — ABNORMAL HIGH (ref 65–99)
Potassium: 3.2 mmol/L — ABNORMAL LOW (ref 3.5–5.3)
Sodium: 139 mmol/L (ref 135–146)
Total Bilirubin: 1 mg/dL (ref 0.2–1.2)
Total Protein: 7.4 g/dL (ref 6.1–8.1)

## 2021-01-05 LAB — CBC WITH DIFFERENTIAL/PLATELET
Absolute Monocytes: 400 cells/uL (ref 200–950)
Basophils Absolute: 42 cells/uL (ref 0–200)
Basophils Relative: 0.8 %
Eosinophils Absolute: 187 cells/uL (ref 15–500)
Eosinophils Relative: 3.6 %
HCT: 45.5 % (ref 38.5–50.0)
Hemoglobin: 15 g/dL (ref 13.2–17.1)
Lymphs Abs: 1206 cells/uL (ref 850–3900)
MCH: 26.9 pg — ABNORMAL LOW (ref 27.0–33.0)
MCHC: 33 g/dL (ref 32.0–36.0)
MCV: 81.5 fL (ref 80.0–100.0)
MPV: 10.2 fL (ref 7.5–12.5)
Monocytes Relative: 7.7 %
Neutro Abs: 3364 cells/uL (ref 1500–7800)
Neutrophils Relative %: 64.7 %
Platelets: 180 10*3/uL (ref 140–400)
RBC: 5.58 10*6/uL (ref 4.20–5.80)
RDW: 14.5 % (ref 11.0–15.0)
Total Lymphocyte: 23.2 %
WBC: 5.2 10*3/uL (ref 3.8–10.8)

## 2021-01-05 LAB — MICROALBUMIN, URINE: Microalb, Ur: 4.6 mg/dL

## 2021-01-05 LAB — HEMOGLOBIN A1C
Hgb A1c MFr Bld: 6.3 % of total Hgb — ABNORMAL HIGH (ref <5.7)
Mean Plasma Glucose: 134 mg/dL
eAG (mmol/L): 7.4 mmol/L

## 2021-01-05 MED ORDER — ATORVASTATIN CALCIUM 40 MG PO TABS: 40.0000 mg | ORAL_TABLET | Freq: Every day | ORAL | 3 refills | Status: DC

## 2021-01-18 DIAGNOSIS — I129 Hypertensive chronic kidney disease with stage 1 through stage 4 chronic kidney disease, or unspecified chronic kidney disease: Secondary | ICD-10-CM | POA: Diagnosis not present

## 2021-01-18 DIAGNOSIS — N400 Enlarged prostate without lower urinary tract symptoms: Secondary | ICD-10-CM | POA: Diagnosis not present

## 2021-01-18 DIAGNOSIS — N184 Chronic kidney disease, stage 4 (severe): Secondary | ICD-10-CM | POA: Diagnosis not present

## 2021-01-18 DIAGNOSIS — R7303 Prediabetes: Secondary | ICD-10-CM | POA: Diagnosis not present

## 2021-01-29 ENCOUNTER — Ambulatory Visit (INDEPENDENT_AMBULATORY_CARE_PROVIDER_SITE_OTHER): Payer: Medicare Other | Admitting: Pharmacist

## 2021-01-29 DIAGNOSIS — I10 Essential (primary) hypertension: Secondary | ICD-10-CM | POA: Diagnosis not present

## 2021-01-29 DIAGNOSIS — E785 Hyperlipidemia, unspecified: Secondary | ICD-10-CM

## 2021-01-29 NOTE — Progress Notes (Signed)
Chronic Care Management Pharmacy Note  01/29/2021 Name:  Mike Davila MRN:  [id number] DOB:  [birth year]  Subjective: Mike Davila is an 75 y.o. year old male who is a primary patient of Mike Davila, Mike Mcgee, MD.  The CCM team was consulted for assistance with disease management and care coordination needs.    Engaged with patient by telephone for follow up visit in response to provider referral for pharmacy case management and/or care coordination services.   Consent to Services:  The patient was given the following information about Chronic Care Management services today, agreed to services, and gave verbal consent: 1. CCM service includes personalized support from designated clinical staff supervised by the primary care provider, including individualized plan of care and coordination with other care providers 2. 24/7 contact phone numbers for assistance for urgent and routine care needs. 3. Service will only be billed when office clinical staff spend 20 minutes or more in a month to coordinate care. 4. Only one practitioner may furnish and bill the service in a calendar month. 5.The patient may stop CCM services at any time (effective at the end of the month) by phone call to the office staff. 6. The patient will be responsible for cost sharing (co-pay) of up to 20% of the service fee (after annual deductible is met). Patient agreed to services and consent obtained.  Patient Care Team: Mike Frizzle, MD as PCP - General (Family Medicine) Mike Davila, [place] as Pharmacist (Pharmacist)  Recent office visits: 01/05/20 Mike Davila) - switched to Atorvastatin 54m daily due to elevated cholesterol  Recent consult visits: None recent  Hospital visits: None in previous 6 months  Objective:  Lab Results  Component Value Date   CREATININE 2.00 (H) 01/04/2021   BUN 18 01/04/2021   GFRNONAA 32 (L) 01/04/2021   GFRAA 37 (L) 01/04/2021   NA 139 01/04/2021   K 3.2 (L) 01/04/2021    CALCIUM 9.7 01/04/2021   CO2 24 01/04/2021   GLUCOSE 122 (H) 01/04/2021    Lab Results  Component Value Date/Time   HGBA1C 6.3 (H) 01/04/2021 11:15 AM   HGBA1C 5.6 06/18/2020 08:05 AM   MICROALBUR 4.6 01/04/2021 11:15 AM   MICROALBUR 4.0 07/14/2020 08:22 AM    Last diabetic Eye exam: No results found for: HMDIABEYEEXA  Last diabetic Foot exam: No results found for: HMDIABFOOTEX   Lab Results  Component Value Date   CHOL 181 01/04/2021   HDL 35 (L) 01/04/2021   LDLCALC 113 (H) 01/04/2021   TRIG 213 (H) 01/04/2021   CHOLHDL 5.2 (H) 01/04/2021    Hepatic Function Latest Ref Rng & Units 01/04/2021 06/18/2020 02/07/2020  Total Protein 6.1 - 8.1 g/dL 7.4 6.8 6.7  Albumin 3.5 - 5.0 g/dL - - -  AST 10 - 35 U/L 21 17 34  ALT 9 - 46 U/L 27 18 57(H)  Alk Phosphatase 38 - 126 U/L - - -  Total Bilirubin 0.2 - 1.2 mg/dL 1.0 0.6 0.8    Lab Results  Component Value Date/Time   TSH 4.99 (H) 10/23/2017 12:08 PM   TSH 3.28 06/15/2016 10:36 AM    CBC Latest Ref Rng & Units 01/04/2021 06/18/2020 02/07/2020  WBC 3.8 - 10.8 Thousand/uL 5.2 5.0 6.4  Hemoglobin 13.2 - 17.1 g/dL 15.0 13.9 13.5  Hematocrit 38.5 - 50.0 % 45.5 43.3 40.1  Platelets 140 - 400 Thousand/uL 180 210 202    Lab Results  Component Value Date/Time   VD25OH 37 06/15/2016 10:36  AM    Clinical ASCVD: No  The 10-year ASCVD risk score Mike Davila DC Jr., et al., 2013) is: 28.1%   Values used to calculate the score:     Age: 42 years     Sex: Male     Is Non-Hispanic African American: No     Diabetic: No     Tobacco smoker: No     Systolic Blood Pressure: 086 mmHg     Is BP treated: Yes     HDL Cholesterol: 35 mg/dL     Total Cholesterol: 181 mg/dL    Depression screen [facility] 2/9 01/04/2021 10/30/2018 10/23/2017  Decreased Interest 0 0 0  Down, Depressed, Hopeless 0 0 0  PHQ - 2 Score 0 0 0     Social History   Tobacco Use  Smoking Status Former Smoker  . Years: 20.00  . Types: Cigarettes  . Quit date: 05/04/2006  .  Years since quitting: 14.7  Smokeless Tobacco Former Systems developer  . Types: Chew  . Quit date: 05/04/2017   BP Readings from Last 3 Encounters:  01/04/21 130/80  07/14/20 (!) 170/98  06/19/20 130/80   Pulse Readings from Last 3 Encounters:  01/04/21 64  07/14/20 (!) 56  06/19/20 79   Wt Readings from Last 3 Encounters:  01/04/21 201 lb (91.2 kg)  07/14/20 196 lb (88.9 kg)  06/19/20 193 lb (87.5 kg)   BMI Readings from Last 3 Encounters:  01/04/21 32.44 kg/m  07/14/20 31.64 kg/m  06/19/20 31.15 kg/m    Assessment/Interventions: Review of patient past medical history, allergies, medications, health status, including review of consultants reports, laboratory and other test data, was performed as part of comprehensive evaluation and provision of chronic care management services.   SDOH:  (Social Determinants of Health) assessments and interventions performed: No  SDOH Screenings   Alcohol Screen: Not on file  Depression (PHQ2-9): Low Risk   . PHQ-2 Score: 0  Financial Resource Strain: Not on file  Food Insecurity: Not on file  Housing: Not on file  Physical Activity: Not on file  Social Connections: Not on file  Stress: Not on file  Tobacco Use: Medium Risk  . Smoking Tobacco Use: Former Smoker  . Smokeless Tobacco Use: Former Mike Davila Needs: Not on file    [place]  Allergies  Allergen Reactions  . Sulfa Antibiotics Hives and Shortness Of Breath  . Crestor [Rosuvastatin Calcium] Other (See Comments)    Leg cramps  . Lipitor [Atorvastatin Calcium] Other (See Comments)    Leg cramps  . Tetanus Toxoids Other (See Comments)    Unknown reaction    Medications Reviewed Today    Reviewed by Mike Davila, [facility] (Pharmacist) on 01/29/21 at [place] List Status: <None>  Medication Order Taking? Sig Documenting Provider Last Dose Status Informant  Accu-Chek Softclix Lancets lancets [id number] No Check bs bid DX: R73.03 Mike Frizzle, MD Taking  Active   albuterol (VENTOLIN HFA) 108 (90 Base) MCG/ACT inhaler [id number] No Inhale 2 puffs into the lungs 3 (three) times daily. Mike Dubois, MD Taking Active   alfuzosin (UROXATRAL) 10 MG 24 hr tablet [id number] No Take 1 tablet (10 mg total) by mouth daily with breakfast. Mike Frizzle, MD Taking Active   amLODipine (NORVASC) 10 MG tablet [id number] No Take 1 tablet (10 mg total) by mouth daily. Mike Frizzle, MD Taking Active   ascorbic acid (VITAMIN C) 500 MG tablet [id number] No Take 1 tablet (500 mg total)  by mouth daily. Mike Dubois, MD Taking Active   atorvastatin (LIPITOR) 40 MG tablet [id number]  Take 1 tablet (40 mg total) by mouth daily. Mike Frizzle, MD  Active   Blood Glucose Monitoring Suppl (ACCU-CHEK GUIDE) w/Device KIT [id number] No 1 Units by Does not apply route in the morning and at bedtime. Mike Frizzle, MD Taking Active   furosemide (LASIX) 40 MG tablet [id number] No Take 1 tablet (40 mg total) by mouth 2 (two) times daily. Mike Frizzle, MD Taking Active   glucose blood (ACCU-CHEK GUIDE) test strip [id number] No Check bs bid DX: R73.03 Mike Frizzle, MD Taking Active   Lancets Misc. (ACCU-CHEK SOFTCLIX LANCET DEV) KIT [id number] No Check bs bid DX: R73.03 Mike Frizzle, MD Taking Active   metoprolol succinate (TOPROL-XL) 25 MG 24 hr tablet [id number] No Take 1 tablet (25 mg total) by mouth daily.  Patient taking differently: Take 50 mg by mouth daily.   Mike Frizzle, MD Taking Active   Multiple Vitamin (MULTIVITAMIN WITH MINERALS) TABS tablet [id number] No Take 1 tablet by mouth daily. Mike Dubois, MD Taking Active   OXYGEN [id number] No Inhale 3 L into the lungs. Continous [provider] Taking Active   potassium chloride SA (KLOR-CON) 20 MEQ tablet [id number] No Take 1 tablet (20 mEq total) by mouth daily. Mike Frizzle, MD Taking Active           Patient Active Problem List   Diagnosis Date Noted  . Class 1 obesity due  to excess calories with body mass index (BMI) of 33.0 to 33.9 in adult   . Acute respiratory failure with hypoxia ([place])   . Pneumonia due to COVID-19 virus 01/02/2020  . CKD (chronic kidney disease), stage III ([place]) 01/02/2020  . Prediabetes 01/02/2020  . Acute renal failure superimposed on stage 4 chronic kidney disease ([place]) 10/28/2017  . Diverticulosis of colon 10/28/2017  . Weight loss, unintentional 10/28/2017  . Metabolic acidosis with normal anion gap and bicarbonate losses 10/28/2017  . Diverticulitis large intestine 10/24/2017  . Hypercalcemia 10/24/2017  . Anemia 10/23/2017  . Hyperkalemia 10/23/2017  . Screening for prostate cancer 12/23/2014  . Hyperlipidemia   . Hypertension   . Hyperglycemia   . Hypertriglyceridemia      There is no immunization history on file for this patient.  Conditions to be addressed/monitored:  Hypertenstion, hyperlipidemia, pre-diabetes  Care Plan : General Pharmacy (Adult)  Updates made by Mike Davila, RPH since 01/29/2021 12:00 AM    Problem: HTN, HLD, DM   Priority: High  Onset Date: 01/29/2021    Goal: Patient-Specific Goal   Start Date: 01/29/2021  Expected End Date: 04/30/2021  This Visit's Progress: On track  Priority: High  Note:   Current Barriers:  . Unable to achieve control of cholesterol   Pharmacist Clinical Goal(s):  Marland Kitchen Patient will achieve control of cholesterol as evidenced by follow up lipid panel through collaboration with PharmD and provider.   Interventions: . 1:1 collaboration with Mike Frizzle, MD regarding development and update of comprehensive plan of care as evidenced by provider attestation and co-signature . Inter-disciplinary care team collaboration (see longitudinal plan of care) . Comprehensive medication review performed; medication list updated in electronic medical record  Hypertension (BP goal <140/90) -Controlled -Current treatment:  Amlodipine 29m daily  Metoprolol XL 231m daily -Medications previously tried: none noted  -Denies hypotensive/hypertensive symptoms -Educated on Daily salt intake goal < 2300 mg; Exercise goal of 150 minutes  per week; Importance of home blood pressure monitoring; Symptoms of hypotension and importance of maintaining adequate hydration; -Counseled to monitor BP at home periodically, document, and provide log at future appointments -Recommended to continue current medication  Hyperlipidemia: (LDL goal < 100) -Not ideally controlled -Current treatment: . Atorvastatin 52m -Medications previously tried: pravastatin  -Current dietary patterns:  -Current exercise habits:  -Educated on Cholesterol goals;  Benefits of statin for ASCVD risk reduction; Importance of limiting foods high in cholesterol;  -Patient called and reported that ever since he started taking the Atorvastatin he has developed a lot of soreness and muscle aches about 17 days after he started taking it.  States he can only tolerate pravastatin in the past. -Collaborated with PCP - will recommend switching back to pravastatin 833mdaily.  Recommend follow up lipid panel to assess efficacy.  Pre-Diabetes (A1c goal <5.7) -Controlled -Current medications: . None -Medications previously tried: none noted -Current home glucose readings . fasting glucose: N/A . post prandial glucose: N/A -Denies hypoglycemic/hyperglycemic symptoms -Current meal patterns:   -Educated on A1c trending upward -Counseled to check feet daily and get yearly eye exams -Counseled on diet and exercise extensively Recommended continue current management strategy   Patient Goals/Self-Care Activities . Patient will:  - take medications as prescribed focus on medication adherence by fill dates await instruction on next steps with lipid lowering medication.  Follow Up Plan: The care management team will reach out to the patient again over the next 30 days.       Medication Assistance:  None required.  Patient affirms current coverage meets needs.  [id number]: [phone]: [phone] pill box? No - does not use Pt endorses 100% compliance   We discussed: Benefits of medication synchronization, packaging and delivery as well as enhanced pharmacist oversight with [employer]. Patient decided to: Continue current medication management strategy  Care Plan and Follow Up Patient Decision:  Patient agrees to Care Plan and Follow-up.  Plan: The care management team will reach out to the patient again over the next 30 days.  ChBeverly MilchPharmD Clinical Pharmacist [phone]

## 2021-01-29 NOTE — Patient Instructions (Addendum)
Visit Information  Goals Addressed            This Visit's Progress   . Manage My Medicine       Timeframe:  Long-Range Goal Priority:  High Start Date: 01/29/21                            Expected End Date:    04/30/21                   Follow Up Date 03/23/21   - call if I am sick and can't take my medicine - keep a list of all the medicines I take; vitamins and herbals too    Why is this important?   . These steps will help you keep on track with your medicines.   Notes: Await instruction on next steps with Lipitor      Patient Care Plan: General Pharmacy (Adult)    Problem Identified: HTN, HLD, DM   Priority: High  Onset Date: 01/29/2021    Goal: Patient-Specific Goal   Start Date: 01/29/2021  Expected End Date: 04/30/2021  This Visit's Progress: On track  Priority: High  Note:   Current Barriers:  . Unable to achieve control of cholesterol   Pharmacist Clinical Goal(s):  Marland Kitchen Patient will achieve control of cholesterol as evidenced by follow up lipid panel through collaboration with PharmD and provider.   Interventions: . 1:1 collaboration with [name], MD regarding development and update of comprehensive plan of care as evidenced by provider attestation and co-signature . Inter-disciplinary care team collaboration (see longitudinal plan of care) . Comprehensive medication review performed; medication list updated in electronic medical record  Hypertension (BP goal <140/90) -Controlled -Current treatment:  Amlodipine 10mg  daily  Metoprolol XL 25mg  daily -Medications previously tried: none noted  -Denies hypotensive/hypertensive symptoms -Educated on Daily salt intake goal < 2300 mg; Exercise goal of 150 minutes per week; Importance of home blood pressure monitoring; Symptoms of hypotension and importance of maintaining adequate hydration; -Counseled to monitor BP at home periodically, document, and provide log at future appointments -Recommended  to continue current medication  Hyperlipidemia: (LDL goal < 100) -Not ideally controlled -Current treatment: . Atorvastatin 40mg  -Medications previously tried: pravastatin  -Current dietary patterns:  -Current exercise habits:  -Educated on Cholesterol goals;  Benefits of statin for ASCVD risk reduction; Importance of limiting foods high in cholesterol;  -Patient called and reported that ever since he started taking the Atorvastatin he has developed a lot of soreness and muscle aches about 17 days after he started taking it.  States he can only tolerate pravastatin in the past. -Collaborated with PCP - will recommend switching back to pravastatin 80mg  daily.  Recommend follow up lipid panel to assess efficacy.  Pre-Diabetes (A1c goal <5.7) -Controlled -Current medications: . None -Medications previously tried: none noted -Current home glucose readings . fasting glucose: N/A . post prandial glucose: N/A -Denies hypoglycemic/hyperglycemic symptoms -Current meal patterns:   -Educated on A1c trending upward -Counseled to check feet daily and get yearly eye exams -Counseled on diet and exercise extensively Recommended continue current management strategy   Patient Goals/Self-Care Activities . Patient will:  - take medications as prescribed focus on medication adherence by fill dates await instruction on next steps with lipid lowering medication.  Follow Up Plan: The care management team will reach out to the patient again over the next 30 days.       The  patient verbalized understanding of instructions, educational materials, and care plan provided today and agreed to receive a mailed copy of patient instructions, educational materials, and care plan.  Telephone follow up appointment with pharmacy team member scheduled for: this month  Mike Davila, [facility]  High Cholesterol  High cholesterol is a condition in which the blood has high levels of a white, waxy substance  similar to fat (cholesterol). The liver makes all the cholesterol that the body needs. The human body needs small amounts of cholesterol to help build cells. A person gets extra or excess cholesterol from the food that he or she eats. The blood carries cholesterol from the liver to the rest of the body. If you have high cholesterol, deposits (plaques) may build up on the walls of your arteries. Arteries are the blood vessels that carry blood away from your heart. These plaques make the arteries narrow and stiff. Cholesterol plaques increase your risk for heart attack and stroke. Work with your health care provider to keep your cholesterol levels in a healthy range. What increases the risk? The following factors may make you more likely to develop this condition:  Eating foods that are high in animal fat (saturated fat) or cholesterol.  Being overweight.  Not getting enough exercise.  A family history of high cholesterol (familial hypercholesterolemia).  Use of tobacco products.  Having diabetes. What are the signs or symptoms? There are no symptoms of this condition. How is this diagnosed? This condition may be diagnosed based on the results of a blood test.  If you are older than 74 years of age, your health care provider may check your cholesterol levels every 4-6 years.  You may be checked more often if you have high cholesterol or other risk factors for heart disease. The blood test for cholesterol measures:  "Bad" cholesterol, or LDL cholesterol. This is the main type of cholesterol that causes heart disease. The desired level is less than 100 mg/dL.  "Good" cholesterol, or HDL cholesterol. HDL helps protect against heart disease by cleaning the arteries and carrying the LDL to the liver for processing. The desired level for HDL is 60 mg/dL or higher.  Triglycerides. These are fats that your body can store or burn for energy. The desired level is less than 150 mg/dL.  Total  cholesterol. This measures the total amount of cholesterol in your blood and includes LDL, HDL, and triglycerides. The desired level is less than 200 mg/dL. How is this treated? This condition may be treated with:  Diet changes. You may be asked to eat foods that have more fiber and less saturated fats or added sugar.  Lifestyle changes. These may include regular exercise, maintaining a healthy weight, and quitting use of tobacco products.  Medicines. These are given when diet and lifestyle changes have not worked. You may be prescribed a statin medicine to help lower your cholesterol levels. Follow these instructions at home: Eating and drinking  Eat a healthy, balanced diet. This diet includes: ? Daily servings of a variety of fresh, frozen, or canned fruits and vegetables. ? Daily servings of whole grain foods that are rich in fiber. ? Foods that are low in saturated fats and trans fats. These include poultry and fish without skin, lean cuts of meat, and low-fat dairy products. ? A variety of fish, especially oily fish that contain omega-3 fatty acids. Aim to eat fish at least 2 times a week.  Avoid foods and drinks that have added sugar.  Use healthy cooking methods, such as roasting, grilling, broiling, baking, poaching, steaming, and stir-frying. Do not fry your food except for stir-frying.   Lifestyle  Get regular exercise. Aim to exercise for a total of 150 minutes a week. Increase your activity level by doing activities such as gardening, walking, and taking the stairs.  Do not use any products that contain nicotine or tobacco, such as cigarettes, e-cigarettes, and chewing tobacco. If you need help quitting, ask your health care provider.   General instructions  Take over-the-counter and prescription medicines only as told by your health care provider.  Keep all follow-up visits as told by your health care provider. This is important. Where to find more information  American  Heart Association: www.heart.org  National Heart, Lung, and Blood Institute: https://wilson-eaton.com/ Contact a health care provider if:  You have trouble achieving or maintaining a healthy diet or weight.  You are starting an exercise program.  You are unable to stop smoking. Get help right away if:  You have chest pain.  You have trouble breathing.  You have any symptoms of a stroke. "BE FAST" is an easy way to remember the main warning signs of a stroke: ? B - Balance. Signs are dizziness, sudden trouble walking, or loss of balance. ? E - Eyes. Signs are trouble seeing or a sudden change in vision. ? F - Face. Signs are sudden weakness or numbness of the face, or the face or eyelid drooping on one side. ? A - Arms. Signs are weakness or numbness in an arm. This happens suddenly and usually on one side of the body. ? S - Speech. Signs are sudden trouble speaking, slurred speech, or trouble understanding what people say. ? T - Time. Time to call emergency services. Write down what time symptoms started.  You have other signs of a stroke, such as: ? A sudden, severe headache with no known cause. ? Nausea or vomiting. ? Seizure. These symptoms may represent a serious problem that is an emergency. Do not wait to see if the symptoms will go away. Get medical help right away. Call your local emergency services (911 in the [place] Do not drive yourself to the hospital. Summary  Cholesterol plaques increase your risk for heart attack and stroke. Work with your health care provider to keep your cholesterol levels in a healthy range.  Eat a healthy, balanced diet, get regular exercise, and maintain a healthy weight.  Do not use any products that contain nicotine or tobacco, such as cigarettes, e-cigarettes, and chewing tobacco.  Get help right away if you have any symptoms of a stroke. This information is not intended to replace advice given to you by your health care provider. Make sure you  discuss any questions you have with your health care provider. Document Revised: 09/09/2019 Document Reviewed: 09/09/2019 Elsevier Patient Education  2021 [employer].

## 2021-02-15 ENCOUNTER — Ambulatory Visit: Payer: Medicare Other

## 2021-02-23 NOTE — Progress Notes (Signed)
Chronic Care Management Pharmacy Note  02/23/2021 Name:  Mike Davila MRN:  [id number] DOB:  08-23-1946  Subjective: [name] is an 75 y.o. year old male who is a primary patient of Pickard, Cammie Mcgee, MD.  The CCM team was consulted for assistance with disease management and care coordination needs.    Engaged with patient by telephone for follow up visit in response to provider referral for pharmacy case management and/or care coordination services.   Consent to Services:  The patient was given the following information about Chronic Care Management services today, agreed to services, and gave verbal consent: 1. CCM service includes personalized support from designated clinical staff supervised by the primary care provider, including individualized plan of care and coordination with other care providers 2. 24/7 contact phone numbers for assistance for urgent and routine care needs. 3. Service will only be billed when office clinical staff spend 20 minutes or more in a month to coordinate care. 4. Only one practitioner may furnish and bill the service in a calendar month. 5.The patient may stop CCM services at any time (effective at the end of the month) by phone call to the office staff. 6. The patient will be responsible for cost sharing (co-pay) of up to 20% of the service fee (after annual deductible is met). Patient agreed to services and consent obtained.  Patient Care Team: [name], MD as PCP - General (Family Medicine) Edythe Clarity, [facility] as Pharmacist (Pharmacist)  Recent office visits: 01/05/20 Dennard Schaumann) - switched to Atorvastatin 33m daily due to elevated cholesterol  Recent consult visits: None recent  Hospital visits: None in previous 6 months  Objective:  Lab Results  Component Value Date   CREATININE 2.00 (H) 01/04/2021   BUN 18 01/04/2021   GFRNONAA 32 (L) 01/04/2021   GFRAA 37 (L) 01/04/2021   NA 139 01/04/2021   K 3.2 (L) 01/04/2021    CALCIUM 9.7 01/04/2021   CO2 24 01/04/2021   GLUCOSE 122 (H) 01/04/2021    Lab Results  Component Value Date/Time   HGBA1C 6.3 (H) 01/04/2021 11:15 AM   HGBA1C 5.6 06/18/2020 08:05 AM   MICROALBUR 4.6 01/04/2021 11:15 AM   MICROALBUR 4.0 07/14/2020 08:22 AM    Last diabetic Eye exam: No results found for: HMDIABEYEEXA  Last diabetic Foot exam: No results found for: HMDIABFOOTEX   Lab Results  Component Value Date   CHOL 181 01/04/2021   HDL 35 (L) 01/04/2021   LDLCALC 113 (H) 01/04/2021   TRIG 213 (H) 01/04/2021   CHOLHDL 5.2 (H) 01/04/2021    Hepatic Function Latest Ref Rng & Units 01/04/2021 06/18/2020 02/07/2020  Total Protein 6.1 - 8.1 g/dL 7.4 6.8 6.7  Albumin 3.5 - 5.0 g/dL - - -  AST 10 - 35 U/L 21 17 34  ALT 9 - 46 U/L 27 18 57(H)  Alk Phosphatase 38 - 126 U/L - - -  Total Bilirubin 0.2 - 1.2 mg/dL 1.0 0.6 0.8    Lab Results  Component Value Date/Time   TSH 4.99 (H) 10/23/2017 12:08 PM   TSH 3.28 06/15/2016 10:36 AM    CBC Latest Ref Rng & Units 01/04/2021 06/18/2020 02/07/2020  WBC 3.8 - 10.8 Thousand/uL 5.2 5.0 6.4  Hemoglobin 13.2 - 17.1 g/dL 15.0 13.9 13.5  Hematocrit 38.5 - 50.0 % 45.5 43.3 40.1  Platelets 140 - 400 Thousand/uL 180 210 202    Lab Results  Component Value Date/Time   VD25OH 37 06/15/2016 10:36  AM    Clinical ASCVD: No  The 10-year ASCVD risk score Mikey Bussing DC Jr., et al., 2013) is: 28.1%   Values used to calculate the score:     Age: 4 years     Sex: Male     Is Non-Hispanic African American: No     Diabetic: No     Tobacco smoker: No     Systolic Blood Pressure: 638 mmHg     Is BP treated: Yes     HDL Cholesterol: 35 mg/dL     Total Cholesterol: 181 mg/dL    Depression screen [facility] 2/9 01/04/2021 10/30/2018 10/23/2017  Decreased Interest 0 0 0  Down, Depressed, Hopeless 0 0 0  PHQ - 2 Score 0 0 0     Social History   Tobacco Use  Smoking Status Former Smoker  . Years: 20.00  . Types: Cigarettes  . Quit date: 05/04/2006  .  Years since quitting: 14.8  Smokeless Tobacco Former Systems developer  . Types: Chew  . Quit date: 05/04/2017   BP Readings from Last 3 Encounters:  01/04/21 130/80  07/14/20 (!) 170/98  06/19/20 130/80   Pulse Readings from Last 3 Encounters:  01/04/21 64  07/14/20 (!) 56  06/19/20 79   Wt Readings from Last 3 Encounters:  01/04/21 201 lb (91.2 kg)  07/14/20 196 lb (88.9 kg)  06/19/20 193 lb (87.5 kg)   BMI Readings from Last 3 Encounters:  01/04/21 32.44 kg/m  07/14/20 31.64 kg/m  06/19/20 31.15 kg/m    Assessment/Interventions: Review of patient past medical history, allergies, medications, health status, including review of consultants reports, laboratory and other test data, was performed as part of comprehensive evaluation and provision of chronic care management services.   SDOH:  (Social Determinants of Health) assessments and interventions performed: No  SDOH Screenings   Alcohol Screen: Not on file  Depression (PHQ2-9): Low Risk   . PHQ-2 Score: 0  Financial Resource Strain: Not on file  Food Insecurity: Not on file  Housing: Not on file  Physical Activity: Not on file  Social Connections: Not on file  Stress: Not on file  Tobacco Use: Medium Risk  . Smoking Tobacco Use: Former Smoker  . Smokeless Tobacco Use: Former Soil scientist Needs: Not on file    [place]  Allergies  Allergen Reactions  . Sulfa Antibiotics Hives and Shortness Of Breath  . Crestor [Rosuvastatin Calcium] Other (See Comments)    Leg cramps  . Lipitor [Atorvastatin Calcium] Other (See Comments)    Leg cramps  . Tetanus Toxoids Other (See Comments)    Unknown reaction    Medications Reviewed Today    Reviewed by Edythe Clarity, [facility] (Pharmacist) on 01/29/21 at [place] List Status: <None>  Medication Order Taking? Sig Documenting Provider Last Dose Status Informant  Accu-Chek Softclix Lancets lancets [id number] No Check bs bid DX: R73.03 [name], MD Taking  Active   albuterol (VENTOLIN HFA) 108 (90 Base) MCG/ACT inhaler [id number] No Inhale 2 puffs into the lungs 3 (three) times daily. Barton Dubois, MD Taking Active   alfuzosin (UROXATRAL) 10 MG 24 hr tablet [id number] No Take 1 tablet (10 mg total) by mouth daily with breakfast. [name], MD Taking Active   amLODipine (NORVASC) 10 MG tablet [id number] No Take 1 tablet (10 mg total) by mouth daily. [name], MD Taking Active   ascorbic acid (VITAMIN C) 500 MG tablet [id number] No Take 1 tablet (500 mg total)  by mouth daily. Barton Dubois, MD Taking Active   atorvastatin (LIPITOR) 40 MG tablet [id number]  Take 1 tablet (40 mg total) by mouth daily. [name], MD  Active   Blood Glucose Monitoring Suppl (ACCU-CHEK GUIDE) w/Device KIT [id number] No 1 Units by Does not apply route in the morning and at bedtime. [name], MD Taking Active   furosemide (LASIX) 40 MG tablet [id number] No Take 1 tablet (40 mg total) by mouth 2 (two) times daily. [name], MD Taking Active   glucose blood (ACCU-CHEK GUIDE) test strip [id number] No Check bs bid DX: R73.03 [name], MD Taking Active   Lancets Misc. (ACCU-CHEK SOFTCLIX LANCET DEV) KIT [id number] No Check bs bid DX: R73.03 [name], MD Taking Active   metoprolol succinate (TOPROL-XL) 25 MG 24 hr tablet [id number] No Take 1 tablet (25 mg total) by mouth daily.  Patient taking differently: Take 50 mg by mouth daily.   [name], MD Taking Active   Multiple Vitamin (MULTIVITAMIN WITH MINERALS) TABS tablet [id number] No Take 1 tablet by mouth daily. Barton Dubois, MD Taking Active   OXYGEN [id number] No Inhale 3 L into the lungs. Continous [provider] Taking Active   potassium chloride SA (KLOR-CON) 20 MEQ tablet [id number] No Take 1 tablet (20 mEq total) by mouth daily. [name], MD Taking Active           Patient Active Problem List   Diagnosis Date Noted  . Class 1 obesity due  to excess calories with body mass index (BMI) of 33.0 to 33.9 in adult   . Acute respiratory failure with hypoxia ([place])   . Pneumonia due to COVID-19 virus 01/02/2020  . CKD (chronic kidney disease), stage III ([place]) 01/02/2020  . Prediabetes 01/02/2020  . Acute renal failure superimposed on stage 4 chronic kidney disease ([place]) 10/28/2017  . Diverticulosis of colon 10/28/2017  . Weight loss, unintentional 10/28/2017  . Metabolic acidosis with normal anion gap and bicarbonate losses 10/28/2017  . Diverticulitis large intestine 10/24/2017  . Hypercalcemia 10/24/2017  . Anemia 10/23/2017  . Hyperkalemia 10/23/2017  . Screening for prostate cancer 12/23/2014  . Hyperlipidemia   . Hypertension   . Hyperglycemia   . Hypertriglyceridemia      There is no immunization history on file for this patient.  Conditions to be addressed/monitored:  Hypertenstion, hyperlipidemia, pre-diabetes  Goals Addressed            This Visit's Progress   . Manage My Medicine       Timeframe:  Long-Range Goal Priority:  High Start Date: 01/29/21                            Expected End Date:    04/30/21                   Follow Up Date 03/23/21   - call if I am sick and can't take my medicine - keep a list of all the medicines I take; vitamins and herbals too    Why is this important?   . These steps will help you keep on track with your medicines.   Notes: Focus on adherence and contact providers with adverse effects.       Patient Care Plan: General Pharmacy (Adult)    Problem Identified: HTN, HLD, DM   Priority: High  Onset Date: 01/29/2021    Goal: Patient-Specific Goal  Start Date: 01/29/2021  Expected End Date: 04/30/2021  Recent Progress: On track  Priority: High  Note:   Current Barriers:  . Unable to achieve control of cholesterol   Pharmacist Clinical Goal(s):  Marland Kitchen Patient will achieve control of cholesterol as evidenced by follow up lipid panel through collaboration with  PharmD and provider.   Interventions: . 1:1 collaboration with [name], MD regarding development and update of comprehensive plan of care as evidenced by provider attestation and co-signature . Inter-disciplinary care team collaboration (see longitudinal plan of care) . Comprehensive medication review performed; medication list updated in electronic medical record  Hypertension (BP goal <140/90) -Controlled -Current treatment:  Amlodipine 36m daily  Metoprolol XL 243mdaily -Medications previously tried: none noted  -Denies hypotensive/hypertensive symptoms -Educated on Daily salt intake goal < 2300 mg; Exercise goal of 150 minutes per week; Importance of home blood pressure monitoring; Symptoms of hypotension and importance of maintaining adequate hydration; -Counseled to monitor BP at home periodically, document, and provide log at future appointments -Recommended to continue current medication  Update 02/26/21  Patient states he is having sever swelling up to his knee lately.  It gets to where it is tight and starts hurting. He takes Lasix prn but this does not always resolve the swelling.  Could consider switching to valsartan as mentioned by PCP before for renal protection. Will consult with PCP - recommend switching Amlodipine 1087maily to Valsartan 160m80mily. Plan to follow up on BP in two weeks and to see if this has helped the swelling.  Hyperlipidemia: (LDL goal < 100) -Not ideally controlled -Current treatment: . Atorvastatin 40mg42mdications previously tried: pravastatin  -Current dietary patterns:  -Current exercise habits:  -Educated on Cholesterol goals;  Benefits of statin for ASCVD risk reduction; Importance of limiting foods high in cholesterol;  -Patient called and reported that ever since he started taking the Atorvastatin he has developed a lot of soreness and muscle aches about 17 days after he started taking it.  States he can only tolerate  pravastatin in the past. -Collaborated with PCP - will recommend switching back to pravastatin 80mg 54my.  Recommend follow up lipid panel to assess efficacy.  Update 02/26/21  Patient is not taking Atorvastatin due to pain and muscle cramping.  He states the only medication he can tolerate is pravastatin.  Unwilling to continue atorvastatin.  Will consult with PCP on switch back to pravastatin 80mg d50m.  Would rather have him on a statin that he can tolerate vs. Not taking one at all. Patient is a high risk with ASCVD 28.1%.  Pre-Diabetes (A1c goal <5.7) -Controlled -Current medications: . None -Medications previously tried: none noted -Current home glucose readings . fasting glucose: N/A . post prandial glucose: N/A -Denies hypoglycemic/hyperglycemic symptoms -Current meal patterns:   -Educated on A1c trending upward -Counseled to check feet daily and get yearly eye exams -Counseled on diet and exercise extensively Recommended continue current management strategy   Patient Goals/Self-Care Activities . Patient will:  - take medications as prescribed focus on medication adherence by fill dates await instruction on next steps with lipid lowering medication.  Follow Up Plan: The care management team will reach out to the patient again over the next 30 days.           Medication Assistance: None required.  Patient affirms current coverage meets needs.  [facility]  [place] Phone: [phone] Fax: [phone]  Uses pill box? No - does not use Pt endorses 100% compliance   We discussed: Benefits of medication synchronization, packaging and delivery as well as enhanced pharmacist oversight with [employer]. Patient decided to: Continue current medication management strategy  Care Plan and Follow Up Patient Decision:  Patient agrees to Care Plan and  Follow-up.  Plan: The care management team will reach out to the patient again over the next 30 days.  Beverly Milch, PharmD Clinical Pharmacist [place] [phone]

## 2021-02-26 ENCOUNTER — Ambulatory Visit (INDEPENDENT_AMBULATORY_CARE_PROVIDER_SITE_OTHER): Payer: Medicare Other | Admitting: Pharmacist

## 2021-02-26 DIAGNOSIS — E785 Hyperlipidemia, unspecified: Secondary | ICD-10-CM | POA: Diagnosis not present

## 2021-02-26 DIAGNOSIS — I10 Essential (primary) hypertension: Secondary | ICD-10-CM | POA: Diagnosis not present

## 2021-02-26 NOTE — Patient Instructions (Addendum)
Visit Information  Goals Addressed            This Visit's Progress   . Manage My Medicine       Timeframe:  Long-Range Goal Priority:  High Start Date: 01/29/21                            Expected End Date:    04/30/21                   Follow Up Date 03/23/21   - call if I am sick and can't take my medicine - keep a list of all the medicines I take; vitamins and herbals too    Why is this important?   . These steps will help you keep on track with your medicines.   Notes: Focus on adherence and contact providers with adverse effects.      Patient Care Plan: General Pharmacy (Adult)    Problem Identified: HTN, HLD, DM   Priority: High  Onset Date: 01/29/2021    Goal: Patient-Specific Goal   Start Date: 01/29/2021  Expected End Date: 04/30/2021  Recent Progress: On track  Priority: High  Note:   Current Barriers:  . Unable to achieve control of cholesterol   Pharmacist Clinical Goal(s):  Marland Kitchen Patient will achieve control of cholesterol as evidenced by follow up lipid panel through collaboration with PharmD and provider.   Interventions: . 1:1 collaboration with [name], MD regarding development and update of comprehensive plan of care as evidenced by provider attestation and co-signature . Inter-disciplinary care team collaboration (see longitudinal plan of care) . Comprehensive medication review performed; medication list updated in electronic medical record  Hypertension (BP goal <140/90) -Controlled -Current treatment:  Amlodipine 10mg  daily  Metoprolol XL 25mg  daily -Medications previously tried: none noted  -Denies hypotensive/hypertensive symptoms -Educated on Daily salt intake goal < 2300 mg; Exercise goal of 150 minutes per week; Importance of home blood pressure monitoring; Symptoms of hypotension and importance of maintaining adequate hydration; -Counseled to monitor BP at home periodically, document, and provide log at future  appointments -Recommended to continue current medication  Update 02/26/21  Patient states he is having sever swelling up to his knee lately.  It gets to where it is tight and starts hurting. He takes Lasix prn but this does not always resolve the swelling.  Could consider switching to valsartan as mentioned by PCP before for renal protection. Will consult with PCP - recommend switching Amlodipine 10mg  daily to Valsartan 160mg  daily. Plan to follow up on BP in two weeks and to see if this has helped the swelling.  Hyperlipidemia: (LDL goal < 100) -Not ideally controlled -Current treatment: . Atorvastatin 40mg  -Medications previously tried: pravastatin  -Current dietary patterns:  -Current exercise habits:  -Educated on Cholesterol goals;  Benefits of statin for ASCVD risk reduction; Importance of limiting foods high in cholesterol;  -Patient called and reported that ever since he started taking the Atorvastatin he has developed a lot of soreness and muscle aches about 17 days after he started taking it.  States he can only tolerate pravastatin in the past. -Collaborated with PCP - will recommend switching back to pravastatin 80mg  daily.  Recommend follow up lipid panel to assess efficacy.  Update 02/26/21  Patient is not taking Atorvastatin due to pain and muscle cramping.  He states the only medication he can tolerate is pravastatin.  Unwilling to continue atorvastatin.  Will  consult with PCP on switch back to pravastatin 80mg  daily.  Would rather have him on a statin that he can tolerate vs. Not taking one at all. Patient is a high risk with ASCVD 28.1%.  Pre-Diabetes (A1c goal <5.7) -Controlled -Current medications: . None -Medications previously tried: none noted -Current home glucose readings . fasting glucose: N/A . post prandial glucose: N/A -Denies hypoglycemic/hyperglycemic symptoms -Current meal patterns:   -Educated on A1c trending upward -Counseled to check feet daily  and get yearly eye exams -Counseled on diet and exercise extensively Recommended continue current management strategy   Patient Goals/Self-Care Activities . Patient will:  - take medications as prescribed focus on medication adherence by fill dates await instruction on next steps with lipid lowering medication.  Follow Up Plan: The care management team will reach out to the patient again over the next 30 days.           The patient verbalized understanding of instructions, educational materials, and care plan provided today and agreed to receive a mailed copy of patient instructions, educational materials, and care plan.  Telephone follow up appointment with pharmacy team member scheduled for: 1 days  Edythe Clarity, [facility]  Managing Your Hypertension Hypertension, also called high blood pressure, is when the force of the blood pressing against the walls of the arteries is too strong. Arteries are blood vessels that carry blood from your heart throughout your body. Hypertension forces the heart to work harder to pump blood and may cause the arteries to become narrow or stiff. Understanding blood pressure readings Your personal target blood pressure may vary depending on your medical conditions, your age, and other factors. A blood pressure reading includes a higher number over a lower number. Ideally, your blood pressure should be below 120/80. You should know that:  The first, or top, number is called the systolic pressure. It is a measure of the pressure in your arteries as your heart beats.  The second, or bottom number, is called the diastolic pressure. It is a measure of the pressure in your arteries as the heart relaxes. Blood pressure is classified into four stages. Based on your blood pressure reading, your health care provider may use the following stages to determine what type of treatment you need, if any. Systolic pressure and diastolic pressure are measured in a unit  called mmHg. Normal  Systolic pressure: below 720.  Diastolic pressure: below 80. Elevated  Systolic pressure: 947-096.  Diastolic pressure: below 80. Hypertension stage 1  Systolic pressure: 283-662.  Diastolic pressure: 94-76. Hypertension stage 2  Systolic pressure: 546 or above.  Diastolic pressure: 90 or above. How can this condition affect me? Managing your hypertension is an important responsibility. Over time, hypertension can damage the arteries and decrease blood flow to important parts of the body, including the brain, heart, and kidneys. Having untreated or uncontrolled hypertension can lead to:  A heart attack.  A stroke.  A weakened blood vessel (aneurysm).  Heart failure.  Kidney damage.  Eye damage.  Metabolic syndrome.  Memory and concentration problems.  Vascular dementia. What actions can I take to manage this condition? Hypertension can be managed by making lifestyle changes and possibly by taking medicines. Your health care provider will help you make a plan to bring your blood pressure within a normal range. Nutrition  Eat a diet that is high in fiber and potassium, and low in salt (sodium), added sugar, and fat. An example eating plan is called the Dietary Approaches  to Stop Hypertension (DASH) diet. To eat this way: ? Eat plenty of fresh fruits and vegetables. Try to fill one-half of your plate at each meal with fruits and vegetables. ? Eat whole grains, such as whole-wheat pasta, brown rice, or whole-grain bread. Fill about one-fourth of your plate with whole grains. ? Eat low-fat dairy products. ? Avoid fatty cuts of meat, processed or cured meats, and poultry with skin. Fill about one-fourth of your plate with lean proteins such as fish, chicken without skin, beans, eggs, and tofu. ? Avoid pre-made and processed foods. These tend to be higher in sodium, added sugar, and fat.  Reduce your daily sodium intake. Most people with hypertension  should eat less than 1,500 mg of sodium a day.   Lifestyle  Work with your health care provider to maintain a healthy body weight or to lose weight. Ask what an ideal weight is for you.  Get at least 30 minutes of exercise that causes your heart to beat faster (aerobic exercise) most days of the week. Activities may include walking, swimming, or biking.  Include exercise to strengthen your muscles (resistance exercise), such as weight lifting, as part of your weekly exercise routine. Try to do these types of exercises for 30 minutes at least 3 days a week.  Do not use any products that contain nicotine or tobacco, such as cigarettes, e-cigarettes, and chewing tobacco. If you need help quitting, ask your health care provider.  Control any long-term (chronic) conditions you have, such as high cholesterol or diabetes.  Identify your sources of stress and find ways to manage stress. This may include meditation, deep breathing, or making time for fun activities.   Alcohol use  Do not drink alcohol if: ? Your health care provider tells you not to drink. ? You are pregnant, may be pregnant, or are planning to become pregnant.  If you drink alcohol: ? Limit how much you use to:  0-1 drink a day for women.  0-2 drinks a day for men. ? Be aware of how much alcohol is in your drink. In the [place] one drink equals one 12 oz bottle of beer (355 mL), one 5 oz glass of wine (148 mL), or one 1 oz glass of hard liquor (44 mL). Medicines Your health care provider may prescribe medicine if lifestyle changes are not enough to get your blood pressure under control and if:  Your systolic blood pressure is 130 or higher.  Your diastolic blood pressure is 80 or higher. Take medicines only as told by your health care provider. Follow the directions carefully. Blood pressure medicines must be taken as told by your health care provider. The medicine does not work as well when you skip doses. Skipping doses  also puts you at risk for problems. Monitoring Before you monitor your blood pressure:  Do not smoke, drink caffeinated beverages, or exercise within 30 minutes before taking a measurement.  Use the bathroom and empty your bladder (urinate).  Sit quietly for at least 5 minutes before taking measurements. Monitor your blood pressure at home as told by your health care provider. To do this:  Sit with your back straight and supported.  Place your feet flat on the floor. Do not cross your legs.  Support your arm on a flat surface, such as a table. Make sure your upper arm is at heart level.  Each time you measure, take two or three readings one minute apart and record the results. You may also  need to have your blood pressure checked regularly by your health care provider.   General information  Talk with your health care provider about your diet, exercise habits, and other lifestyle factors that may be contributing to hypertension.  Review all the medicines you take with your health care provider because there may be side effects or interactions.  Keep all visits as told by your health care provider. Your health care provider can help you create and adjust your plan for managing your high blood pressure. Where to find more information  National Heart, Lung, and Blood Institute: https://wilson-eaton.com/  American Heart Association: www.heart.org Contact a health care provider if:  You think you are having a reaction to medicines you have taken.  You have repeated (recurrent) headaches.  You feel dizzy.  You have swelling in your ankles.  You have trouble with your vision. Get help right away if:  You develop a severe headache or confusion.  You have unusual weakness or numbness, or you feel faint.  You have severe pain in your chest or abdomen.  You vomit repeatedly.  You have trouble breathing. These symptoms may represent a serious problem that is an emergency. Do not wait  to see if the symptoms will go away. Get medical help right away. Call your local emergency services (911 in the [place] Do not drive yourself to the hospital. Summary  Hypertension is when the force of blood pumping through your arteries is too strong. If this condition is not controlled, it may put you at risk for serious complications.  Your personal target blood pressure may vary depending on your medical conditions, your age, and other factors. For most people, a normal blood pressure is less than 120/80.  Hypertension is managed by lifestyle changes, medicines, or both.  Lifestyle changes to help manage hypertension include losing weight, eating a healthy, low-sodium diet, exercising more, stopping smoking, and limiting alcohol. This information is not intended to replace advice given to you by your health care provider. Make sure you discuss any questions you have with your health care provider. Document Revised: 11/15/2019 Document Reviewed: 09/10/2019 Elsevier Patient Education  2021 [employer].

## 2021-03-01 ENCOUNTER — Telehealth: Payer: Self-pay | Admitting: Pharmacist

## 2021-03-01 MED ORDER — PRAVASTATIN SODIUM 40 MG PO TABS: 40.0000 mg | ORAL_TABLET | Freq: Every day | ORAL | 3 refills | Status: DC

## 2021-03-01 MED ORDER — VALSARTAN 160 MG PO TABS: 160.0000 mg | ORAL_TABLET | Freq: Every day | ORAL | 3 refills | Status: DC

## 2021-03-01 NOTE — Telephone Encounter (Signed)
Will call in Rx for Valsartan 160mg  po qd and Pravastatin 40mg  daily.

## 2021-03-01 NOTE — Addendum Note (Signed)
Addended by: Edythe Clarity on: 03/01/2021 11:54 AM   Modules accepted: Orders

## 2021-03-01 NOTE — Telephone Encounter (Signed)
-----   Message from [name], MD sent at 03/01/2021  6:34 AM EDT ----- I am fin switching amlodipine to valsartan 160 mg poqday and d/c lipitor and switch to pravastatin 40 mg poqday.  ----- Message ----- From: Edythe Clarity, [facility] Sent: 02/26/2021  11:49 AM EDT To: [name], MD  Spoke with patient this morning.  He reports severe swelling up to his knee and it is tight and hurting.  We discussed possibility of amlodipine causing this.  I saw you had previously discussed switching to Valsartan for renal protection and mentioned this to him and he was agreeable.  He is also not tolerating Lipitor and has d/c it on his own.  Thoughts on switching him back to pravastatin 80mg  so that he will at least be on a statin?  I am leaving at noon today so will not be able to follow up with him but if we call something in he asked for Margreta Journey to let him know!  Thanks  Beverly Milch, PharmD Clinical Pharmacist [place] [phone]

## 2021-03-03 ENCOUNTER — Telehealth: Payer: Self-pay | Admitting: *Deleted

## 2021-03-03 NOTE — Telephone Encounter (Signed)
-----   Message from Edythe Clarity, [facility] sent at 03/01/2021  8:29 AM EDT ----- Darrick Meigs,  Per Dr. Dennard Schaumann,  Can we get an rx for Mr. Jagiello for Valsartan 160mg  QD Pravastatin 40mg  QD  Called in to [place]?  Thanks!   ----- Message ----- From: [name], MD Sent: 03/01/2021   6:34 AM EDT To: Edythe Clarity, RPH  I am fin switching amlodipine to valsartan 160 mg poqday and d/c lipitor and switch to pravastatin 40 mg poqday.  ----- Message ----- From: Edythe Clarity, [facility] Sent: 02/26/2021  11:49 AM EDT To: [name], MD  Spoke with patient this morning.  He reports severe swelling up to his knee and it is tight and hurting.  We discussed possibility of amlodipine causing this.  I saw you had previously discussed switching to Valsartan for renal protection and mentioned this to him and he was agreeable.  He is also not tolerating Lipitor and has d/c it on his own.  Thoughts on switching him back to pravastatin 80mg  so that he will at least be on a statin?  I am leaving at noon today so will not be able to follow up with him but if we call something in he asked for Margreta Journey to let him know!  Thanks  Beverly Milch, PharmD Clinical Pharmacist [place] [phone]

## 2021-03-11 ENCOUNTER — Other Ambulatory Visit: Payer: Self-pay | Admitting: Family Medicine

## 2021-03-15 ENCOUNTER — Telehealth: Payer: Self-pay | Admitting: Family Medicine

## 2021-03-15 NOTE — Telephone Encounter (Signed)
Left message for patient to call back and schedule Medicare Annual Wellness Visit (AWV) in office.   If not able to come in office, please offer to do virtually or by telephone.   Last AWV: 10/30/2018  Please schedule at anytime with [facility]-Nurse Health Advisor.  If any questions, please contact me at [phone]

## 2021-03-23 ENCOUNTER — Telehealth: Payer: Self-pay | Admitting: Pharmacist

## 2021-03-23 NOTE — Progress Notes (Addendum)
    Chronic Care Management Pharmacy Assistant   Name: LANGDON CROSSON  MRN: [id number] DOB: Jul 28, 1946  Reason for Encounter: Adherence Review   Medications: Outpatient Encounter Medications as of 03/23/2021  Medication Sig   Accu-Chek Softclix Lancets lancets Check bs bid DX: R73.03   albuterol (VENTOLIN HFA) 108 (90 Base) MCG/ACT inhaler Inhale 2 puffs into the lungs 3 (three) times daily.   alfuzosin (UROXATRAL) 10 MG 24 hr tablet Take 1 tablet (10 mg total) by mouth daily with breakfast.   ascorbic acid (VITAMIN C) 500 MG tablet Take 1 tablet (500 mg total) by mouth daily.   Blood Glucose Monitoring Suppl (ACCU-CHEK GUIDE) w/Device KIT 1 Units by Does not apply route in the morning and at bedtime.   furosemide (LASIX) 40 MG tablet Take 1 tablet (40 mg total) by mouth 2 (two) times daily.   glucose blood (ACCU-CHEK GUIDE) test strip Check bs bid DX: R73.03   Lancets Misc. (ACCU-CHEK SOFTCLIX LANCET DEV) KIT Check bs bid DX: R73.03   metoprolol succinate (TOPROL-XL) 25 MG 24 hr tablet TAKE ONE TABLET BY MOUTH DAILY   Multiple Vitamin (MULTIVITAMIN WITH MINERALS) TABS tablet Take 1 tablet by mouth daily.   OXYGEN Inhale 3 L into the lungs. Continous   potassium chloride SA (KLOR-CON) 20 MEQ tablet Take 1 tablet (20 mEq total) by mouth daily.   pravastatin (PRAVACHOL) 40 MG tablet Take 1 tablet (40 mg total) by mouth daily.   valsartan (DIOVAN) 160 MG tablet Take 1 tablet (160 mg total) by mouth daily.   No facility-administered encounter medications on file as of 03/23/2021.   Reviewed the patients chart for any medical/health and/or medication changes there were not any at this time. There were not any recent labs or images taken.   Follow-Up:Pharmacist Review  Charlann Lange, [place] Pharmacist Assistant [phone]

## 2021-04-13 ENCOUNTER — Encounter: Payer: Self-pay | Admitting: Family Medicine

## 2021-04-13 ENCOUNTER — Ambulatory Visit (INDEPENDENT_AMBULATORY_CARE_PROVIDER_SITE_OTHER): Payer: Medicare Other | Admitting: Family Medicine

## 2021-04-13 ENCOUNTER — Other Ambulatory Visit: Payer: Self-pay

## 2021-04-13 VITALS — BP 136/82 | HR 84 | Temp 98.7°F | Resp 16 | Ht 66.0 in | Wt 202.0 lb

## 2021-04-13 DIAGNOSIS — M6281 Muscle weakness (generalized): Secondary | ICD-10-CM | POA: Diagnosis not present

## 2021-04-13 NOTE — Progress Notes (Signed)
Subjective:    Patient ID: Mike Davila, male    DOB: April 01, 1946, 75 y.o.   MRN: [id number]  HPI Patient reports easy fatigability in the muscles from his waist down.  He states that his muscles feel achy and tired as soon as he wakes up in the morning.  He has noticed decreased strength in his butt muscles, thigh muscles, and hip muscles over the last several months especially since increasing his statin.  He states that his muscles were sore when he was taking 20 mg of pravastatin but once we increase to 40 mg, it became unbearable.  He reports tenderness in his muscles as well as weakness in his muscles.  The weakness does not extend to his arms.  He denies any strokelike symptoms.  He denies any claudication.  He denies any numbness or tingling in his legs. Past Medical History:  Diagnosis Date   Anemia    Benign localized prostatic hyperplasia with lower urinary tract symptoms (LUTS)    CKD (chronic kidney disease), stage III [facility])    nephrologist-  dr Hinda Lenis ([facility] in [place])-- per pt last visit 06/ 2019   Diverticulosis of colon    ED (erectile dysfunction)    History of acute gouty arthritis 10/23/2017   History of acute renal failure 10/23/2017   hospital admission-- w/ acute tubular necrosis superimposed on ckd 3--- resolved   History of diverticulitis of colon 10/05/2017   admission-- mild-- resolved w/ medical management   History of metabolic acidosis 12/75/1700   admission-- resolved   History of palpitations    approx. 2009, per pt no issue since   History of urinary retention 10/2017   Hypertension    Incomplete right bundle branch block    Mixed hyperlipidemia    Nephrolithiasis    left side nonobstructive   Pre-diabetes    Right ureteral stone    Wears dentures    upper    Past Surgical History:  Procedure Laterality Date   ANKLE SURGERY Bilateral 1990s to early 2000s   removal bone spurs   APPENDECTOMY  child   COLONOSCOPY N/A 10/27/2017    Procedure: COLONOSCOPY;  Surgeon: Rogene Houston, MD;  [facility];  Service: Endoscopy;  Laterality: N/A;   CYSTOSCOPY/URETEROSCOPY/HOLMIUM LASER/STENT PLACEMENT Right 05/09/2018   Procedure: CYSTOSCOPY RIGHT URETEROSCOPY/HOLMIUM LASER/STENT PLACEMENT;  Surgeon: Ardis Hughs, MD;  Location: [facility];  Service: Urology;  Laterality: Right;   HEMATOMA EVACUATION  1990s   right calf   INGUINAL HERNIA REPAIR Bilateral age 42s   PLANTAR FASCIA SURGERY Left 2004   TONSILLECTOMY  child   TRANSTHORACIC ECHOCARDIOGRAM  10/24/2017   ef 60-65%,  grade 1 diastolic dysfunction   Current Outpatient Medications on File Prior to Visit  Medication Sig Dispense Refill   Accu-Chek Softclix Lancets lancets Check bs bid DX: R73.03 100 each 12   albuterol (VENTOLIN HFA) 108 (90 Base) MCG/ACT inhaler Inhale 2 puffs into the lungs 3 (three) times daily. 8 g 1   alfuzosin (UROXATRAL) 10 MG 24 hr tablet Take 1 tablet (10 mg total) by mouth daily with breakfast. 90 tablet 3   ascorbic acid (VITAMIN C) 500 MG tablet Take 1 tablet (500 mg total) by mouth daily. 30 tablet 1   Blood Glucose Monitoring Suppl (ACCU-CHEK GUIDE) w/Device KIT 1 Units by Does not apply route in the morning and at bedtime. 1 kit 0   furosemide (LASIX) 40 MG tablet Take 1 tablet (40 mg total)  by mouth 2 (two) times daily. 180 tablet 3   glucose blood (ACCU-CHEK GUIDE) test strip Check bs bid DX: R73.03 100 each 3   Lancets Misc. (ACCU-CHEK SOFTCLIX LANCET DEV) KIT Check bs bid DX: R73.03 1 kit 0   metoprolol succinate (TOPROL-XL) 25 MG 24 hr tablet TAKE ONE TABLET BY MOUTH DAILY 90 tablet 0   Multiple Vitamin (MULTIVITAMIN WITH MINERALS) TABS tablet Take 1 tablet by mouth daily. 30 tablet 1   OXYGEN Inhale 3 L into the lungs. Continous     potassium chloride SA (KLOR-CON) 20 MEQ tablet Take 1 tablet (20 mEq total) by mouth daily. 90 tablet 3   pravastatin (PRAVACHOL) 40 MG tablet Take 1 tablet (40 mg total)  by mouth daily. 90 tablet 3   valsartan (DIOVAN) 160 MG tablet Take 1 tablet (160 mg total) by mouth daily. 90 tablet 3   No current facility-administered medications on file prior to visit.   Allergies  Allergen Reactions   Sulfa Antibiotics Hives and Shortness Of Breath   Crestor [Rosuvastatin Calcium] Other (See Comments)    Leg cramps   Lipitor [Atorvastatin Calcium] Other (See Comments)    Leg cramps   Tetanus Toxoids Other (See Comments)    Unknown reaction   Social History   Socioeconomic History   Marital status: Single    Spouse name: Not on file   Number of children: Not on file   Years of education: Not on file   Highest education level: Not on file  Occupational History   Not on file  Tobacco Use   Smoking status: Former    Years: 20.00    Pack years: 0.00    Types: Cigarettes    Quit date: 05/04/2006    Years since quitting: 14.9   Smokeless tobacco: Former    Types: Chew    Quit date: 05/04/2017  Vaping Use   Vaping Use: Never used  Substance and Sexual Activity   Alcohol use: No   Drug use: No   Sexual activity: Yes  Other Topics Concern   Not on file  Social History Narrative   Married.   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   Social Determinants of Health   Financial Resource Strain: Not on file  Food Insecurity: Not on file  Transportation Needs: Not on file  Physical Activity: Not on file  Stress: Not on file  Social Connections: Not on file  Intimate Partner Violence: Not on file   Family History  Problem Relation Age of Onset   Thyroid cancer Mother    Heart disease Father        MI   Colon cancer Father    Melanoma Father    Benign prostatic hyperplasia Brother    Diabetes Mellitus II Maternal Grandmother    Ovarian cancer Daughter     Review of Systems     Objective:   Physical Exam Constitutional:      Appearance: Normal appearance. He is normal weight.  Cardiovascular:     Rate and Rhythm: Normal rate and regular rhythm.  Pulmonary:      Effort: Pulmonary effort is normal. No respiratory distress.     Breath sounds: Normal breath sounds. No wheezing or rales.  Musculoskeletal:     Right lower leg: No edema.     Left lower leg: No edema.  Neurological:     General: No focal deficit present.     Mental Status: He is alert and oriented to person, place, and time. Mental status  is at baseline.     Cranial Nerves: No cranial nerve deficit.     Sensory: No sensory deficit.     Motor: No weakness.     Coordination: Coordination normal.     Gait: Gait normal.     Deep Tendon Reflexes: Reflexes normal.        Assessment & Plan:  Muscle weakness (generalized) - Plan: CK, Sedimentation rate, COMPLETE METABOLIC PANEL WITH GFR, CBC with Differential/Platelet Neurologic exam today is completely normal.  There is no perceptible weakness in his legs that I can determine on exam however he is subjectively weak.  Therefore I do believe this is a reaction to the statins.  I will have him hold the statin completely and I will check a CK, sed rate, CBC, CMP.  If labs are normal we will monitor the patient for 2 to 3 weeks.  If symptoms are improving we could try switching to Zetia.  If symptoms or not improving, I would recommend next nerve conduction study/EMG evaluation.  However I suspect statin induced myopathy

## 2021-04-14 ENCOUNTER — Other Ambulatory Visit: Payer: Self-pay | Admitting: Family Medicine

## 2021-04-14 LAB — COMPLETE METABOLIC PANEL WITH GFR
AG Ratio: 1.6 (calc) (ref 1.0–2.5)
ALT: 25 U/L (ref 9–46)
AST: 30 U/L (ref 10–35)
Albumin: 4.3 g/dL (ref 3.6–5.1)
Alkaline phosphatase (APISO): 82 U/L (ref 35–144)
BUN/Creatinine Ratio: 10 (calc) (ref 6–22)
BUN: 19 mg/dL (ref 7–25)
CO2: 17 mmol/L — ABNORMAL LOW (ref 20–32)
Calcium: 9.4 mg/dL (ref 8.6–10.3)
Chloride: 108 mmol/L (ref 98–110)
Creat: 1.99 mg/dL — ABNORMAL HIGH (ref 0.70–1.18)
GFR, Est African American: 37 mL/min/{1.73_m2} — ABNORMAL LOW (ref >=60)
GFR, Est Non African American: 32 mL/min/{1.73_m2} — ABNORMAL LOW (ref >=60)
Globulin: 2.7 g/dL (calc) (ref 1.9–3.7)
Glucose, Bld: 100 mg/dL — ABNORMAL HIGH (ref 65–99)
Potassium: 4.1 mmol/L (ref 3.5–5.3)
Sodium: 141 mmol/L (ref 135–146)
Total Bilirubin: 0.6 mg/dL (ref 0.2–1.2)
Total Protein: 7 g/dL (ref 6.1–8.1)

## 2021-04-14 LAB — CBC WITH DIFFERENTIAL/PLATELET
Absolute Monocytes: 344 cells/uL (ref 200–950)
Basophils Absolute: 50 cells/uL (ref 0–200)
Basophils Relative: 1.2 %
Eosinophils Absolute: 122 cells/uL (ref 15–500)
Eosinophils Relative: 2.9 %
HCT: 42.2 % (ref 38.5–50.0)
Hemoglobin: 13.8 g/dL (ref 13.2–17.1)
Lymphs Abs: 1050 cells/uL (ref 850–3900)
MCH: 27.8 pg (ref 27.0–33.0)
MCHC: 32.7 g/dL (ref 32.0–36.0)
MCV: 84.9 fL (ref 80.0–100.0)
MPV: 10.2 fL (ref 7.5–12.5)
Monocytes Relative: 8.2 %
Neutro Abs: 2633 cells/uL (ref 1500–7800)
Neutrophils Relative %: 62.7 %
Platelets: 170 10*3/uL (ref 140–400)
RBC: 4.97 10*6/uL (ref 4.20–5.80)
RDW: 15 % (ref 11.0–15.0)
Total Lymphocyte: 25 %
WBC: 4.2 10*3/uL (ref 3.8–10.8)

## 2021-04-14 LAB — SEDIMENTATION RATE: Sed Rate: 17 mm/h (ref 0–20)

## 2021-04-14 LAB — CK: Total CK: 33 U/L — ABNORMAL LOW (ref 44–196)

## 2021-04-21 ENCOUNTER — Encounter (HOSPITAL_COMMUNITY): Payer: Self-pay | Admitting: Emergency Medicine

## 2021-04-21 ENCOUNTER — Other Ambulatory Visit: Payer: Self-pay

## 2021-04-21 ENCOUNTER — Telehealth: Payer: Self-pay | Admitting: *Deleted

## 2021-04-21 ENCOUNTER — Emergency Department (HOSPITAL_COMMUNITY): Payer: Medicare Other

## 2021-04-21 ENCOUNTER — Emergency Department (HOSPITAL_COMMUNITY)
Admission: EM | Admit: 2021-04-21 | Discharge: 2021-04-21 | Disposition: A | Discharge status: Home / Self Care | Payer: Medicare Other | Attending: Emergency Medicine | Admitting: Emergency Medicine

## 2021-04-21 DIAGNOSIS — N183 Chronic kidney disease, stage 3 unspecified: Secondary | ICD-10-CM | POA: Diagnosis not present

## 2021-04-21 DIAGNOSIS — Z87891 Personal history of nicotine dependence: Secondary | ICD-10-CM | POA: Insufficient documentation

## 2021-04-21 DIAGNOSIS — I10 Essential (primary) hypertension: Secondary | ICD-10-CM | POA: Diagnosis not present

## 2021-04-21 DIAGNOSIS — Z8616 Personal history of COVID-19: Secondary | ICD-10-CM | POA: Diagnosis not present

## 2021-04-21 DIAGNOSIS — Z79899 Other long term (current) drug therapy: Secondary | ICD-10-CM | POA: Diagnosis not present

## 2021-04-21 DIAGNOSIS — I129 Hypertensive chronic kidney disease with stage 1 through stage 4 chronic kidney disease, or unspecified chronic kidney disease: Secondary | ICD-10-CM | POA: Diagnosis not present

## 2021-04-21 DIAGNOSIS — R519 Headache, unspecified: Secondary | ICD-10-CM | POA: Diagnosis not present

## 2021-04-21 LAB — BASIC METABOLIC PANEL
Anion gap: 8 (ref 5–15)
BUN: 17 mg/dL (ref 8–23)
CO2: 26 mmol/L (ref 22–32)
Calcium: 9.4 mg/dL (ref 8.9–10.3)
Chloride: 106 mmol/L (ref 98–111)
Creatinine, Ser: 1.84 mg/dL — ABNORMAL HIGH (ref 0.61–1.24)
GFR, Estimated: 38 mL/min — ABNORMAL LOW (ref >60)
Glucose, Bld: 99 mg/dL (ref 70–99)
Potassium: 3.8 mmol/L (ref 3.5–5.1)
Sodium: 140 mmol/L (ref 135–145)

## 2021-04-21 LAB — URINALYSIS, ROUTINE W REFLEX MICROSCOPIC
Bacteria, UA: NONE SEEN
Bilirubin Urine: NEGATIVE
Glucose, UA: NEGATIVE mg/dL
Hgb urine dipstick: NEGATIVE
Ketones, ur: NEGATIVE mg/dL
Nitrite: NEGATIVE
Protein, ur: NEGATIVE mg/dL
Specific Gravity, Urine: 1.004 — ABNORMAL LOW (ref 1.005–1.030)
pH: 7 (ref 5.0–8.0)

## 2021-04-21 LAB — CBC
HCT: 44.8 % (ref 39.0–52.0)
Hemoglobin: 14.7 g/dL (ref 13.0–17.0)
MCH: 28.4 pg (ref 26.0–34.0)
MCHC: 32.8 g/dL (ref 30.0–36.0)
MCV: 86.5 fL (ref 80.0–100.0)
Platelets: 155 10*3/uL (ref 150–400)
RBC: 5.18 MIL/uL (ref 4.22–5.81)
RDW: 16.2 % — ABNORMAL HIGH (ref 11.5–15.5)
WBC: 4.4 10*3/uL (ref 4.0–10.5)
nRBC: 0 % (ref 0.0–0.2)

## 2021-04-21 LAB — TROPONIN I (HIGH SENSITIVITY)
Troponin I (High Sensitivity): 4 ng/L (ref <18)
Troponin I (High Sensitivity): 4 ng/L (ref <18)

## 2021-04-21 MED ORDER — VALSARTAN 80 MG PO TABS: 80.0000 mg | ORAL_TABLET | Freq: Every evening | ORAL | 0 refills | Status: DC | PRN

## 2021-04-21 NOTE — ED Notes (Signed)
Patient's pulse rate higher than normal as he has been getting up and standing beside bed in order to urinate.

## 2021-04-21 NOTE — ED Triage Notes (Signed)
Pt states he has HTN and a left sided headache.  He is on medication for his HTN, but his PCP is on vacation and he was told to come to the ED if he developed a headache.

## 2021-04-21 NOTE — ED Provider Notes (Signed)
[facility] EMERGENCY DEPARTMENT Provider Note   CSN: [id number] Arrival date & time: 04/21/21  1147     History Chief Complaint  Patient presents with   Hypertension    Mike Davila is a 75 y.o. male with history of obstructing kidney stones, hypertension, chronic kidney disease, presenting to the emergency department with complaint of hypertension and mild headache.  He reports typically his blood pressure is well controlled at about 767-341 systolic at home.  However beginning yesterday noticed blood pressure was spiking.  This morning he was checking it and it was over 937 systolic.  He has been compliant with his home medications which include metoprolol extended release as well as valsartan.  He also takes a low-dose of diuretic every morning.  He reports he has a very mild left-sided headache behind his left eye, which is reminiscent of prior headaches used to get in the past.  He says it is extremely minimal.  He denies nausea, vomiting, vision changes, dizziness, numbness or weakness.      HPI     Past Medical History:  Diagnosis Date   Anemia    Benign localized prostatic hyperplasia with lower urinary tract symptoms (LUTS)    CKD (chronic kidney disease), stage III [facility])    nephrologist-  dr Hinda Lenis ([facility] in [place])-- per pt last visit 06/ 2019   Diverticulosis of colon    ED (erectile dysfunction)    History of acute gouty arthritis 10/23/2017   History of acute renal failure 10/23/2017   hospital admission-- w/ acute tubular necrosis superimposed on ckd 3--- resolved   History of diverticulitis of colon 10/05/2017   admission-- mild-- resolved w/ medical management   History of metabolic acidosis 90/24/0973   admission-- resolved   History of palpitations    approx. 2009, per pt no issue since   History of urinary retention 10/2017   Hypertension    Incomplete right bundle branch block    Mixed hyperlipidemia    Nephrolithiasis    left side  nonobstructive   Pre-diabetes    Right ureteral stone    Wears dentures    upper    Patient Active Problem List   Diagnosis Date Noted   Class 1 obesity due to excess calories with body mass index (BMI) of 33.0 to 33.9 in adult    Acute respiratory failure with hypoxia ([place])    Pneumonia due to COVID-19 virus 01/02/2020   CKD (chronic kidney disease), stage III ([place]) 01/02/2020   Prediabetes 01/02/2020   Acute renal failure superimposed on stage 4 chronic kidney disease ([place]) 10/28/2017   Diverticulosis of colon 10/28/2017   Weight loss, unintentional 53/29/9242   Metabolic acidosis with normal anion gap and bicarbonate losses 10/28/2017   Diverticulitis large intestine 10/24/2017   Hypercalcemia 10/24/2017   Anemia 10/23/2017   Hyperkalemia 10/23/2017   Screening for prostate cancer 12/23/2014   Hyperlipidemia    Hypertension    Hyperglycemia    Hypertriglyceridemia     Past Surgical History:  Procedure Laterality Date   ANKLE SURGERY Bilateral 1990s to early 2000s   removal bone spurs   APPENDECTOMY  child   COLONOSCOPY N/A 10/27/2017   Procedure: COLONOSCOPY;  Surgeon: Rogene Houston, MD;  [facility];  Service: Endoscopy;  Laterality: N/A;   CYSTOSCOPY/URETEROSCOPY/HOLMIUM LASER/STENT PLACEMENT Right 05/09/2018   Procedure: CYSTOSCOPY RIGHT URETEROSCOPY/HOLMIUM LASER/STENT PLACEMENT;  Surgeon: Ardis Hughs, MD;  Location: [facility];  Service: Urology;  Laterality: Right;  HEMATOMA EVACUATION  1990s   right calf   INGUINAL HERNIA REPAIR Bilateral age 23s   PLANTAR FASCIA SURGERY Left 2004   TONSILLECTOMY  child   TRANSTHORACIC ECHOCARDIOGRAM  10/24/2017   ef 60-65%,  grade 1 diastolic dysfunction       Family History  Problem Relation Age of Onset   Thyroid cancer Mother    Heart disease Father        MI   Colon cancer Father    Melanoma Father    Benign prostatic hyperplasia Brother    Diabetes Mellitus II Maternal  Grandmother    Ovarian cancer Daughter     Social History   Tobacco Use   Smoking status: Former    Years: 20.00    Pack years: 0.00    Types: Cigarettes    Quit date: 05/04/2006    Years since quitting: 14.9   Smokeless tobacco: Former    Types: Chew    Quit date: 05/04/2017  Vaping Use   Vaping Use: Never used  Substance Use Topics   Alcohol use: No   Drug use: No    Home Medications Prior to Admission medications   Medication Sig Start Date End Date Taking? Authorizing Provider  valsartan (DIOVAN) 80 MG tablet Take 1 tablet (80 mg total) by mouth at bedtime as needed for up to 30 doses. Take if systolic blood pressure (top number) is above 150 mmhg in the evening. 04/21/21  Yes Aldon Hengst, Carola Rhine, MD  Accu-Chek Softclix Lancets lancets Check bs bid DX: R73.03 02/10/20   [name], MD  albuterol (VENTOLIN HFA) 108 (90 Base) MCG/ACT inhaler Inhale 2 puffs into the lungs 3 (three) times daily. 01/14/20   Barton Dubois, MD  alfuzosin (UROXATRAL) 10 MG 24 hr tablet TAKE 1 TABLET BY MOUTH ONCE A DAY. 04/14/21   [name], MD  ascorbic acid (VITAMIN C) 500 MG tablet Take 1 tablet (500 mg total) by mouth daily. 01/15/20   Barton Dubois, MD  Blood Glucose Monitoring Suppl (ACCU-CHEK GUIDE) w/Device KIT 1 Units by Does not apply route in the morning and at bedtime. 02/10/20   [name], MD  furosemide (LASIX) 40 MG tablet Take 1 tablet (40 mg total) by mouth 2 (two) times daily. 03/02/20   [name], MD  glucose blood (ACCU-CHEK GUIDE) test strip Check bs bid DX: R73.03 02/10/20   [name], MD  Lancets Misc. (ACCU-CHEK SOFTCLIX LANCET DEV) KIT Check bs bid DX: R73.03 02/10/20   [name], MD  metoprolol succinate (TOPROL-XL) 25 MG 24 hr tablet TAKE ONE TABLET BY MOUTH DAILY 03/11/21   [name], MD  Multiple Vitamin (MULTIVITAMIN WITH MINERALS) TABS tablet Take 1 tablet by mouth daily. 01/15/20   Barton Dubois, MD  OXYGEN Inhale 3 L into the  lungs. Continous    [provider]  potassium chloride SA (KLOR-CON) 20 MEQ tablet Take 1 tablet (20 mEq total) by mouth daily. 03/04/20   [name], MD  pravastatin (PRAVACHOL) 40 MG tablet Take 1 tablet (40 mg total) by mouth daily. 03/01/21   [name], MD  valsartan (DIOVAN) 160 MG tablet Take 1 tablet (160 mg total) by mouth daily. 03/01/21   [name], MD    Allergies    Sulfa antibiotics, Crestor [rosuvastatin calcium], Lipitor [atorvastatin calcium], and Tetanus toxoids  Review of Systems   Review of Systems  Constitutional:  Negative for chills and fever.  HENT:  Negative for ear pain and sore throat.   Eyes:  Negative for pain and visual disturbance.  Respiratory:  Negative for cough and shortness of breath.   Cardiovascular:  Negative for chest pain and palpitations.  Gastrointestinal:  Negative for abdominal pain and vomiting.  Genitourinary:  Negative for dysuria and hematuria.  Musculoskeletal:  Negative for arthralgias and back pain.  Skin:  Negative for color change and rash.  Neurological:  Positive for headaches. Negative for syncope, speech difficulty, weakness, light-headedness and numbness.  All other systems reviewed and are negative.[    Physical Exam Updated Vital Signs BP 136/90   Pulse (!) 58   Temp 97.8 F (36.6 C) (Oral)   Resp 19   Ht 5' 6"  (1.676 m)   Wt 91.6 kg   SpO2 97%   BMI 32.60 kg/m   Physical Exam Constitutional:      General: He is not in acute distress. HENT:     Head: Normocephalic and atraumatic.  Eyes:     Conjunctiva/sclera: Conjunctivae normal.     Pupils: Pupils are equal, round, and reactive to light.  Cardiovascular:     Rate and Rhythm: Normal rate and regular rhythm.  Pulmonary:     Effort: Pulmonary effort is normal. No respiratory distress.  Abdominal:     General: There is no distension.     Tenderness: There is no abdominal tenderness.  Skin:    General: Skin is warm and dry.   Neurological:     General: No focal deficit present.     Mental Status: He is alert and oriented to person, place, and time. Mental status is at baseline.     Cranial Nerves: No cranial nerve deficit.     Sensory: No sensory deficit.     Motor: No weakness.  Psychiatric:        Mood and Affect: Mood normal.        Behavior: Behavior normal.    ED Results / Procedures / Treatments   Labs (all labs ordered are listed, but only abnormal results are displayed) Labs Reviewed  BASIC METABOLIC PANEL - Abnormal; Notable for the following components:      Result Value   Creatinine, Ser 1.84 (*)    GFR, Estimated 38 (*)    All other components within normal limits  CBC - Abnormal; Notable for the following components:   RDW 16.2 (*)    All other components within normal limits  URINALYSIS, ROUTINE W REFLEX MICROSCOPIC - Abnormal; Notable for the following components:   Color, Urine COLORLESS (*)    Specific Gravity, Urine 1.004 (*)    Leukocytes,Ua TRACE (*)    All other components within normal limits  TROPONIN I (HIGH SENSITIVITY)  TROPONIN I (HIGH SENSITIVITY)    EKG None  Radiology CT Head Wo Contrast  Result Date: 04/21/2021 CLINICAL DATA:  Left-sided headaches EXAM: CT HEAD WITHOUT CONTRAST TECHNIQUE: Contiguous axial images were obtained from the base of the skull through the vertex without intravenous contrast. COMPARISON:  None. FINDINGS: Brain: No evidence of acute infarction, hemorrhage, hydrocephalus, extra-axial collection or mass lesion/mass effect. Mild atrophic changes are noted. Vascular: No hyperdense vessel or unexpected calcification. Skull: Burr holes are noted posteriorly. Sinuses/Orbits: No acute finding. Other: None IMPRESSION: Mild atrophic changes without acute abnormality. Electronically Signed   By: Inez Catalina M.D.   On: 04/21/2021 13:55    Procedures Procedures   Medications Ordered in ED Medications - No data to display  ED Course  I have  reviewed the triage vital signs and the nursing notes.  Pertinent labs & imaging results that were available during my care of the patient were reviewed by me and considered in my medical decision making (see chart for details).  75 yo male here with HTN x 2 days, mild frontal headache.  He does not want pain medications for this, states, "It's so mild I wouldn't even take an advil at home for it."    Labs reviewed -trop low and flat on repeat, ECG with no acute ischemic findings, Cr near baseline CTH without evidence of ICH  Doubt ACS, CVA, or hypertensive emergency We can add on 80 mg valsartan as an evening PRN dose if his BP remains high.  He knows to check it and record at home.  He can f/u with PCP.  All questions answered from patient and his wife who was present.    Clinical Course as of 04/21/21 1640  Wed Apr 21, 2021  1255 BP 154/90 during my assessment  [MT]  1421 Blood pressure remains mildly elevated, but he is essentially asymptomatic.  Labs are near baseline.  Did not see evidence of an acute kidney injury, stroke, or cardiac issue.  We discussed adding an additional half a tablet of valsartan at night and rechecking his blood pressure.  He and his wife verbalized understanding [MT]    Clinical Course User Index [MT] Alizaya Oshea, Carola Rhine, MD    Final Clinical Impression(s) / ED Diagnoses Final diagnoses:  Hypertension, unspecified type    Rx / DC Orders ED Discharge Orders          Ordered    valsartan (DIOVAN) 80 MG tablet  At bedtime PRN        04/21/21 1423             Wyvonnia Dusky, MD 04/21/21 1640

## 2021-04-21 NOTE — Telephone Encounter (Signed)
Received call from patient [phone]~ telephone.   Reports that BP has been trending up.   Reports that BP readings are as follows: 06/28 8PM- 150/98 06/29 7AM- 170/99- before meds 06/29 8AM- 143/93- 1 hour after meds 06/29 9AM- 140/92- 2 hours after meds  Patient denies chest pain, SOB, HA, vision changes.   Reports that he is currently taking Metoprolol XR 25mg  PO QAM, Valstartan 160mg  PO QAM, and Lasix 40mg  PO BID.   Reports that he is concerned about diastolic BP. Inquired if more medication is needed.   Please advise.

## 2021-04-21 NOTE — Telephone Encounter (Signed)
May need to increase medicine, please have him schedule f/u visit

## 2021-04-21 NOTE — ED Notes (Signed)
Pt back from ct, pt reports that his head barely hurts, reports 1/10 pain or less

## 2021-04-21 NOTE — Discharge Instructions (Addendum)
You can add an additional half a tablet of Valsartan (80 mg) at night for your high blood pressure.  You should check your blood pressure and take this tablet if your systolic pressure or top number is above 150 mmhg.  Keep checking your blood pressure twice a day, once when you wake up and once in the evening.  Jot down these blood pressures in a diary to show your doctor.  If your blood pressures normalize over the next few days can go back to your old blood pressure regimen.

## 2021-04-22 ENCOUNTER — Ambulatory Visit: Payer: Medicare Other

## 2021-04-22 NOTE — Telephone Encounter (Signed)
Call placed to patient.   States that he was seen in ER last night for HTN. Was advised to add Diovan 80mg  Q PM if BP >150.   Reports that AM BP noted at 172/ 103 prior to medication. 1 hour after meds, BP noted at 140/ 92.  Appointment scheduled for F/U.

## 2021-04-30 ENCOUNTER — Encounter: Payer: Self-pay | Admitting: Family Medicine

## 2021-04-30 ENCOUNTER — Ambulatory Visit (INDEPENDENT_AMBULATORY_CARE_PROVIDER_SITE_OTHER): Payer: Medicare Other | Admitting: Family Medicine

## 2021-04-30 ENCOUNTER — Other Ambulatory Visit: Payer: Self-pay

## 2021-04-30 VITALS — BP 128/84 | HR 86 | Temp 98.0°F | Resp 16 | Ht 66.0 in | Wt 200.0 lb

## 2021-04-30 DIAGNOSIS — I10 Essential (primary) hypertension: Secondary | ICD-10-CM

## 2021-05-02 ENCOUNTER — Emergency Department (HOSPITAL_COMMUNITY)
Admission: EM | Admit: 2021-05-02 | Discharge: 2021-05-03 | Disposition: A | Discharge status: Home / Self Care | Payer: Medicare Other | Attending: Emergency Medicine | Admitting: Emergency Medicine

## 2021-05-02 ENCOUNTER — Encounter (HOSPITAL_COMMUNITY): Payer: Self-pay | Admitting: Emergency Medicine

## 2021-05-02 ENCOUNTER — Other Ambulatory Visit: Payer: Self-pay

## 2021-05-02 DIAGNOSIS — I129 Hypertensive chronic kidney disease with stage 1 through stage 4 chronic kidney disease, or unspecified chronic kidney disease: Secondary | ICD-10-CM | POA: Diagnosis not present

## 2021-05-02 DIAGNOSIS — M360 Dermato(poly)myositis in neoplastic disease: Secondary | ICD-10-CM | POA: Diagnosis not present

## 2021-05-02 DIAGNOSIS — Z87891 Personal history of nicotine dependence: Secondary | ICD-10-CM | POA: Insufficient documentation

## 2021-05-02 DIAGNOSIS — N184 Chronic kidney disease, stage 4 (severe): Secondary | ICD-10-CM | POA: Diagnosis not present

## 2021-05-02 DIAGNOSIS — R21 Rash and other nonspecific skin eruption: Secondary | ICD-10-CM | POA: Diagnosis not present

## 2021-05-02 DIAGNOSIS — Z79899 Other long term (current) drug therapy: Secondary | ICD-10-CM | POA: Insufficient documentation

## 2021-05-02 DIAGNOSIS — D690 Allergic purpura: Secondary | ICD-10-CM | POA: Diagnosis not present

## 2021-05-02 NOTE — ED Triage Notes (Signed)
Pt c/o rash all over that started on bilateral feet. Pt states that the rash burns more than itching.

## 2021-05-03 DIAGNOSIS — D690 Allergic purpura: Secondary | ICD-10-CM | POA: Diagnosis not present

## 2021-05-03 LAB — BASIC METABOLIC PANEL
Anion gap: 5 (ref 5–15)
BUN: 19 mg/dL (ref 8–23)
CO2: 29 mmol/L (ref 22–32)
Calcium: 8.9 mg/dL (ref 8.9–10.3)
Chloride: 101 mmol/L (ref 98–111)
Creatinine, Ser: 1.96 mg/dL — ABNORMAL HIGH (ref 0.61–1.24)
GFR, Estimated: 35 mL/min — ABNORMAL LOW (ref >60)
Glucose, Bld: 149 mg/dL — ABNORMAL HIGH (ref 70–99)
Potassium: 3.1 mmol/L — ABNORMAL LOW (ref 3.5–5.1)
Sodium: 135 mmol/L (ref 135–145)

## 2021-05-03 LAB — CBC WITH DIFFERENTIAL/PLATELET
Abs Immature Granulocytes: 0.01 10*3/uL (ref 0.00–0.07)
Basophils Absolute: 0.1 10*3/uL (ref 0.0–0.1)
Basophils Relative: 1 %
Eosinophils Absolute: 0.2 10*3/uL (ref 0.0–0.5)
Eosinophils Relative: 4 %
HCT: 42.7 % (ref 39.0–52.0)
Hemoglobin: 14 g/dL (ref 13.0–17.0)
Immature Granulocytes: 0 %
Lymphocytes Relative: 23 %
Lymphs Abs: 1.1 10*3/uL (ref 0.7–4.0)
MCH: 28.6 pg (ref 26.0–34.0)
MCHC: 32.8 g/dL (ref 30.0–36.0)
MCV: 87.3 fL (ref 80.0–100.0)
Monocytes Absolute: 0.5 10*3/uL (ref 0.1–1.0)
Monocytes Relative: 9 %
Neutro Abs: 3.1 10*3/uL (ref 1.7–7.7)
Neutrophils Relative %: 63 %
Platelets: 179 10*3/uL (ref 150–400)
RBC: 4.89 MIL/uL (ref 4.22–5.81)
RDW: 15.9 % — ABNORMAL HIGH (ref 11.5–15.5)
WBC: 4.9 10*3/uL (ref 4.0–10.5)
nRBC: 0 % (ref 0.0–0.2)

## 2021-05-03 NOTE — ED Provider Notes (Signed)
[facility] Provider Note   CSN: [id number] Arrival date & time: 05/02/21  2327     History Chief Complaint  Patient presents with   Rash    Mike Davila is a 75 y.o. male.  Patient presents with a raised red rash on bilateral lower extremities.  He states the rash started yesterday.  Describes them as itchy and burning in sensation.  Denies fevers or cough or vomiting or diarrhea.  Denies any recent illnesses.  Denies any recent illnesses.      Past Medical History:  Diagnosis Date   Anemia    Benign localized prostatic hyperplasia with lower urinary tract symptoms (LUTS)    CKD (chronic kidney disease), stage III [facility])    nephrologist-  dr Hinda Lenis ([facility] in [place])-- per pt last visit 06/ 2019   Diverticulosis of colon    ED (erectile dysfunction)    History of acute gouty arthritis 10/23/2017   History of acute renal failure 10/23/2017   hospital admission-- w/ acute tubular necrosis superimposed on ckd 3--- resolved   History of diverticulitis of colon 10/05/2017   admission-- mild-- resolved w/ medical management   History of metabolic acidosis 10/93/2355   admission-- resolved   History of palpitations    approx. 2009, per pt no issue since   History of urinary retention 10/2017   Hypertension    Incomplete right bundle branch block    Mixed hyperlipidemia    Nephrolithiasis    left side nonobstructive   Pre-diabetes    Right ureteral stone    Wears dentures    upper    Patient Active Problem List   Diagnosis Date Noted   Class 1 obesity due to excess calories with body mass index (BMI) of 33.0 to 33.9 in adult    Acute respiratory failure with hypoxia ([place])    Pneumonia due to COVID-19 virus 01/02/2020   CKD (chronic kidney disease), stage III ([place]) 01/02/2020   Prediabetes 01/02/2020   Acute renal failure superimposed on stage 4 chronic kidney disease ([place]) 10/28/2017   Diverticulosis of colon 10/28/2017   Weight loss,  unintentional 73/22/0254   Metabolic acidosis with normal anion gap and bicarbonate losses 10/28/2017   Diverticulitis large intestine 10/24/2017   Hypercalcemia 10/24/2017   Anemia 10/23/2017   Hyperkalemia 10/23/2017   Screening for prostate cancer 12/23/2014   Hyperlipidemia    Hypertension    Hyperglycemia    Hypertriglyceridemia     Past Surgical History:  Procedure Laterality Date   ANKLE SURGERY Bilateral 1990s to early 2000s   removal bone spurs   APPENDECTOMY  child   COLONOSCOPY N/A 10/27/2017   Procedure: COLONOSCOPY;  Surgeon: Rogene Houston, MD;  [facility];  Service: Endoscopy;  Laterality: N/A;   CYSTOSCOPY/URETEROSCOPY/HOLMIUM LASER/STENT PLACEMENT Right 05/09/2018   Procedure: CYSTOSCOPY RIGHT URETEROSCOPY/HOLMIUM LASER/STENT PLACEMENT;  Surgeon: Ardis Hughs, MD;  Location: [facility];  Service: Urology;  Laterality: Right;   HEMATOMA EVACUATION  1990s   right calf   INGUINAL HERNIA REPAIR Bilateral age 10s   PLANTAR FASCIA SURGERY Left 2004   TONSILLECTOMY  child   TRANSTHORACIC ECHOCARDIOGRAM  10/24/2017   ef 60-65%,  grade 1 diastolic dysfunction       Family History  Problem Relation Age of Onset   Thyroid cancer Mother    Heart disease Father        MI   Colon cancer Father    Melanoma Father    Benign  prostatic hyperplasia Brother    Diabetes Mellitus II Maternal Grandmother    Ovarian cancer Daughter     Social History   Tobacco Use   Smoking status: Former    Years: 20.00    Pack years: 0.00    Types: Cigarettes    Quit date: 05/04/2006    Years since quitting: 15.0   Smokeless tobacco: Former    Types: Chew    Quit date: 05/04/2017  Vaping Use   Vaping Use: Never used  Substance Use Topics   Alcohol use: No   Drug use: No    Home Medications Prior to Admission medications   Medication Sig Start Date End Date Taking? Authorizing Provider  Accu-Chek Softclix Lancets lancets Check bs bid DX:  R73.03 02/10/20   [name], MD  albuterol (VENTOLIN HFA) 108 (90 Base) MCG/ACT inhaler Inhale 2 puffs into the lungs 3 (three) times daily. 01/14/20   Barton Dubois, MD  alfuzosin (UROXATRAL) 10 MG 24 hr tablet TAKE 1 TABLET BY MOUTH ONCE A DAY. 04/14/21   [name], MD  ascorbic acid (VITAMIN C) 500 MG tablet Take 1 tablet (500 mg total) by mouth daily. 01/15/20   Barton Dubois, MD  Blood Glucose Monitoring Suppl (ACCU-CHEK GUIDE) w/Device KIT 1 Units by Does not apply route in the morning and at bedtime. 02/10/20   [name], MD  furosemide (LASIX) 40 MG tablet Take 1 tablet (40 mg total) by mouth 2 (two) times daily. Patient taking differently: Take 40 mg by mouth daily. 03/02/20   [name], MD  glucose blood (ACCU-CHEK GUIDE) test strip Check bs bid DX: R73.03 02/10/20   [name], MD  Lancets Misc. (ACCU-CHEK SOFTCLIX LANCET DEV) KIT Check bs bid DX: R73.03 02/10/20   [name], MD  metoprolol succinate (TOPROL-XL) 25 MG 24 hr tablet TAKE ONE TABLET BY MOUTH DAILY 03/11/21   [name], MD  Multiple Vitamin (MULTIVITAMIN WITH MINERALS) TABS tablet Take 1 tablet by mouth daily. 01/15/20   Barton Dubois, MD  OXYGEN Inhale 3 L into the lungs. Continous    [provider]  potassium chloride SA (KLOR-CON) 20 MEQ tablet Take 1 tablet (20 mEq total) by mouth daily. 03/04/20   [name], MD  valsartan (DIOVAN) 160 MG tablet Take 1 tablet (160 mg total) by mouth daily. 03/01/21   [name], MD  valsartan (DIOVAN) 80 MG tablet Take 1 tablet (80 mg total) by mouth at bedtime as needed for up to 30 doses. Take if systolic blood pressure (top number) is above 150 mmhg in the evening. 04/21/21   Trifan, Carola Rhine, MD    Allergies    Sulfa antibiotics, Crestor [rosuvastatin calcium], Lipitor [atorvastatin calcium], and Tetanus toxoids  Review of Systems   Review of Systems  Constitutional:  Negative for fever.  HENT:  Negative for  ear pain and sore throat.   Eyes:  Negative for pain.  Respiratory:  Negative for cough.   Cardiovascular:  Negative for chest pain.  Gastrointestinal:  Negative for abdominal pain.  Genitourinary:  Negative for flank pain.  Musculoskeletal:  Negative for back pain.  Skin:  Positive for rash. Negative for color change.  Neurological:  Negative for syncope.  All other systems reviewed and are negative.  Physical Exam Updated Vital Signs BP (!) 150/96 (BP Location: Left Arm)   Pulse 68   Temp 97.9 F (36.6 C) (Oral)   Resp 18   Ht  5' 6"  (1.676 m)   Wt 90.7 kg   SpO2 97%   BMI 32.28 kg/m   Physical Exam Constitutional:      General: He is not in acute distress.    Appearance: He is well-developed.  HENT:     Head: Normocephalic.     Nose: Nose normal.  Eyes:     Extraocular Movements: Extraocular movements intact.  Cardiovascular:     Rate and Rhythm: Normal rate.  Pulmonary:     Effort: Pulmonary effort is normal.  Skin:    Coloration: Skin is not jaundiced.     Comments: Patient has a raised papules bilateral lower extremities.  No ulcerative lesions.  These are nonblanching.  Neurological:     Mental Status: He is alert. Mental status is at baseline.    ED Results / Procedures / Treatments   Labs (all labs ordered are listed, but only abnormal results are displayed) Labs Reviewed  CBC WITH DIFFERENTIAL/PLATELET - Abnormal; Notable for the following components:      Result Value   RDW 15.9 (*)    All other components within normal limits  BASIC METABOLIC PANEL - Abnormal; Notable for the following components:   Potassium 3.1 (*)    Glucose, Bld 149 (*)    Creatinine, Ser 1.96 (*)    GFR, Estimated 35 (*)    All other components within normal limits    EKG None  Radiology No results found.  Procedures Procedures   Medications Ordered in ED Medications - No data to display  ED Course  I have reviewed the triage vital signs and the nursing  notes.  Pertinent labs & imaging results that were available during my care of the patient were reviewed by me and considered in my medical decision making (see chart for details).    MDM Rules/Calculators/A&P                          Clinically suspect HSP.  Labs are sent and kidney function appears persistent from prior levels.  Recommending NSAID use for itchiness and pain.  Recommending outpatient follow-up with his doctor within the week.   Final Clinical Impression(s) / ED Diagnoses Final diagnoses:  HSP (Henoch Schonlein purpura) ([place])    Rx / DC Orders ED Discharge Orders     None        Luna Fuse, MD 05/03/21 [phone]

## 2021-05-03 NOTE — Progress Notes (Signed)
Subjective:    Patient ID: Mike Davila, male    DOB: 1946-09-20, 75 y.o.   MRN: [id number]  HPI Recently, the patient's blood pressure has been elevated in the morning.  His systolic blood pressures first thing in the morning have been between 160 and 170.  Diastolic blood pressures have been between 80 and 90.  He then takes his medication and by lunchtime, his systolic blood pressure can be between 90 and 120.  His blood pressures were well controlled throughout the remainder of the day with systolic blood pressures in the 120s or less.  He is concerned due to the spike in the morning.  He denies any chest pain shortness of breath or dyspnea on exertion. Past Medical History:  Diagnosis Date   Anemia    Benign localized prostatic hyperplasia with lower urinary tract symptoms (LUTS)    CKD (chronic kidney disease), stage III [facility])    nephrologist-  dr Hinda Lenis ([facility] in [place])-- per pt last visit 06/ 2019   Diverticulosis of colon    ED (erectile dysfunction)    History of acute gouty arthritis 10/23/2017   History of acute renal failure 10/23/2017   hospital admission-- w/ acute tubular necrosis superimposed on ckd 3--- resolved   History of diverticulitis of colon 10/05/2017   admission-- mild-- resolved w/ medical management   History of metabolic acidosis 40/98/1191   admission-- resolved   History of palpitations    approx. 2009, per pt no issue since   History of urinary retention 10/2017   Hypertension    Incomplete right bundle branch block    Mixed hyperlipidemia    Nephrolithiasis    left side nonobstructive   Pre-diabetes    Right ureteral stone    Wears dentures    upper    Past Surgical History:  Procedure Laterality Date   ANKLE SURGERY Bilateral 1990s to early 2000s   removal bone spurs   APPENDECTOMY  child   COLONOSCOPY N/A 10/27/2017   Procedure: COLONOSCOPY;  Surgeon: Rogene Houston, MD;  [facility];  Service: Endoscopy;   Laterality: N/A;   CYSTOSCOPY/URETEROSCOPY/HOLMIUM LASER/STENT PLACEMENT Right 05/09/2018   Procedure: CYSTOSCOPY RIGHT URETEROSCOPY/HOLMIUM LASER/STENT PLACEMENT;  Surgeon: Ardis Hughs, MD;  Location: [facility];  Service: Urology;  Laterality: Right;   HEMATOMA EVACUATION  1990s   right calf   INGUINAL HERNIA REPAIR Bilateral age 40s   PLANTAR FASCIA SURGERY Left 2004   TONSILLECTOMY  child   TRANSTHORACIC ECHOCARDIOGRAM  10/24/2017   ef 60-65%,  grade 1 diastolic dysfunction   Current Outpatient Medications on File Prior to Visit  Medication Sig Dispense Refill   Accu-Chek Softclix Lancets lancets Check bs bid DX: R73.03 100 each 12   albuterol (VENTOLIN HFA) 108 (90 Base) MCG/ACT inhaler Inhale 2 puffs into the lungs 3 (three) times daily. 8 g 1   alfuzosin (UROXATRAL) 10 MG 24 hr tablet TAKE 1 TABLET BY MOUTH ONCE A DAY. 90 tablet 0   ascorbic acid (VITAMIN C) 500 MG tablet Take 1 tablet (500 mg total) by mouth daily. 30 tablet 1   Blood Glucose Monitoring Suppl (ACCU-CHEK GUIDE) w/Device KIT 1 Units by Does not apply route in the morning and at bedtime. 1 kit 0   furosemide (LASIX) 40 MG tablet Take 1 tablet (40 mg total) by mouth 2 (two) times daily. (Patient taking differently: Take 40 mg by mouth daily.) 180 tablet 3   glucose blood (ACCU-CHEK GUIDE) test strip  Check bs bid DX: R73.03 100 each 3   Lancets Misc. (ACCU-CHEK SOFTCLIX LANCET DEV) KIT Check bs bid DX: R73.03 1 kit 0   metoprolol succinate (TOPROL-XL) 25 MG 24 hr tablet TAKE ONE TABLET BY MOUTH DAILY 90 tablet 0   Multiple Vitamin (MULTIVITAMIN WITH MINERALS) TABS tablet Take 1 tablet by mouth daily. 30 tablet 1   OXYGEN Inhale 3 L into the lungs. Continous     potassium chloride SA (KLOR-CON) 20 MEQ tablet Take 1 tablet (20 mEq total) by mouth daily. 90 tablet 3   valsartan (DIOVAN) 160 MG tablet Take 1 tablet (160 mg total) by mouth daily. 90 tablet 3   valsartan (DIOVAN) 80 MG tablet Take 1  tablet (80 mg total) by mouth at bedtime as needed for up to 30 doses. Take if systolic blood pressure (top number) is above 150 mmhg in the evening. 30 tablet 0   No current facility-administered medications on file prior to visit.   Allergies  Allergen Reactions   Sulfa Antibiotics Hives and Shortness Of Breath   Crestor [Rosuvastatin Calcium] Other (See Comments)    Leg cramps   Lipitor [Atorvastatin Calcium] Other (See Comments)    Leg cramps   Tetanus Toxoids Other (See Comments)    Unknown reaction   Social History   Socioeconomic History   Marital status: Single    Spouse name: Not on file   Number of children: Not on file   Years of education: Not on file   Highest education level: Not on file  Occupational History   Not on file  Tobacco Use   Smoking status: Former    Years: 20.00    Pack years: 0.00    Types: Cigarettes    Quit date: 05/04/2006    Years since quitting: 15.0   Smokeless tobacco: Former    Types: Chew    Quit date: 05/04/2017  Vaping Use   Vaping Use: Never used  Substance and Sexual Activity   Alcohol use: No   Drug use: No   Sexual activity: Yes  Other Topics Concern   Not on file  Social History Narrative   Married.   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   Social Determinants of Health   Financial Resource Strain: Not on file  Food Insecurity: Not on file  Transportation Needs: Not on file  Physical Activity: Not on file  Stress: Not on file  Social Connections: Not on file  Intimate Partner Violence: Not on file   Family History  Problem Relation Age of Onset   Thyroid cancer Mother    Heart disease Father        MI   Colon cancer Father    Melanoma Father    Benign prostatic hyperplasia Brother    Diabetes Mellitus II Maternal Grandmother    Ovarian cancer Daughter     Review of Systems     Objective:   Physical Exam Vitals reviewed.  Constitutional:      General: He is not in acute distress.    Appearance: Normal appearance. He is  normal weight. He is not ill-appearing or toxic-appearing.  Cardiovascular:     Rate and Rhythm: Normal rate and regular rhythm.     Heart sounds: Normal heart sounds. No murmur heard.   No gallop.  Pulmonary:     Effort: Pulmonary effort is normal. No respiratory distress.     Breath sounds: Normal breath sounds. No stridor. No wheezing, rhonchi or rales.  Neurological:  General: No focal deficit present.     Mental Status: He is alert and oriented to person, place, and time. Mental status is at baseline.     Cranial Nerves: No cranial nerve deficit.        Assessment & Plan:  Primary hypertension I have recommended that the patient take his metoprolol first thing in the morning and take his valsartan in the afternoon or evening.  Therefore he spreading his medication out throughout the day.  Hopefully this will prevent the spike first thing in the morning.  I believe this by first thing in the morning may be due to the fact that his medication is wearing off prematurely.  Thereby, splitting up his medication would cause better 24-hour coverage.  If not, after a week or 2, if his blood pressures remain high first thing in the morning I would increase valsartan to 160 mg twice daily from the 160 mg once daily he is currently taking

## 2021-05-03 NOTE — Discharge Instructions (Addendum)
Take Tylenol or Motrin as needed for itchiness and pain.  Follow-up with your doctor within the week. Return immediately if you have fevers worsening symptoms or any additional concerns.

## 2021-05-11 ENCOUNTER — Encounter: Payer: Self-pay | Admitting: Family Medicine

## 2021-05-11 ENCOUNTER — Ambulatory Visit (INDEPENDENT_AMBULATORY_CARE_PROVIDER_SITE_OTHER): Payer: Medicare Other | Admitting: Family Medicine

## 2021-05-11 ENCOUNTER — Other Ambulatory Visit: Payer: Self-pay

## 2021-05-11 VITALS — BP 134/78 | HR 74 | Temp 98.7°F | Resp 16 | Ht 66.0 in | Wt 205.0 lb

## 2021-05-11 DIAGNOSIS — I776 Arteritis, unspecified: Secondary | ICD-10-CM | POA: Diagnosis not present

## 2021-05-11 MED ORDER — PREDNISONE 20 MG PO TABS: ORAL_TABLET | ORAL | 0 refills | Status: DC

## 2021-05-11 NOTE — Progress Notes (Signed)
Subjective:    Patient ID: Mike Davila, male    DOB: 1946-03-19, 75 y.o.   MRN: [id number]  HPI Patient was seen in the emergency room on July 10 for a rash on both lower extremities.  The rash consisted of 2 to 3 mm erythematous purple papules that itched and burned.  There were numerous papules all over the dorsum of both feet and the anterior ankles of both lower legs.  He also has similar spots showing up on his chest and on his arms although these were much more isolated.  He states that he denied any fever or chills or nausea or vomiting or cough or shortness of breath or pleurisy or chest pain.  He denies any new medications.  Concern was about Henoch-Schnlein purpura from the emergency room provider.  He is here today for follow-up. Past Medical History:  Diagnosis Date   Anemia    Benign localized prostatic hyperplasia with lower urinary tract symptoms (LUTS)    CKD (chronic kidney disease), stage III [facility])    nephrologist-  dr Hinda Lenis ([facility] in [place])-- per pt last visit 06/ 2019   Diverticulosis of colon    ED (erectile dysfunction)    History of acute gouty arthritis 10/23/2017   History of acute renal failure 10/23/2017   hospital admission-- w/ acute tubular necrosis superimposed on ckd 3--- resolved   History of diverticulitis of colon 10/05/2017   admission-- mild-- resolved w/ medical management   History of metabolic acidosis 50/56/9794   admission-- resolved   History of palpitations    approx. 2009, per pt no issue since   History of urinary retention 10/2017   Hypertension    Incomplete right bundle branch block    Mixed hyperlipidemia    Nephrolithiasis    left side nonobstructive   Pre-diabetes    Right ureteral stone    Wears dentures    upper    Past Surgical History:  Procedure Laterality Date   ANKLE SURGERY Bilateral 1990s to early 2000s   removal bone spurs   APPENDECTOMY  child   COLONOSCOPY N/A 10/27/2017   Procedure: COLONOSCOPY;   Surgeon: Rogene Houston, MD;  [facility];  Service: Endoscopy;  Laterality: N/A;   CYSTOSCOPY/URETEROSCOPY/HOLMIUM LASER/STENT PLACEMENT Right 05/09/2018   Procedure: CYSTOSCOPY RIGHT URETEROSCOPY/HOLMIUM LASER/STENT PLACEMENT;  Surgeon: Ardis Hughs, MD;  Location: [facility];  Service: Urology;  Laterality: Right;   HEMATOMA EVACUATION  1990s   right calf   INGUINAL HERNIA REPAIR Bilateral age 50s   PLANTAR FASCIA SURGERY Left 2004   TONSILLECTOMY  child   TRANSTHORACIC ECHOCARDIOGRAM  10/24/2017   ef 60-65%,  grade 1 diastolic dysfunction   Current Outpatient Medications on File Prior to Visit  Medication Sig Dispense Refill   Accu-Chek Softclix Lancets lancets Check bs bid DX: R73.03 100 each 12   albuterol (VENTOLIN HFA) 108 (90 Base) MCG/ACT inhaler Inhale 2 puffs into the lungs 3 (three) times daily. 8 g 1   alfuzosin (UROXATRAL) 10 MG 24 hr tablet TAKE 1 TABLET BY MOUTH ONCE A DAY. 90 tablet 0   ascorbic acid (VITAMIN C) 500 MG tablet Take 1 tablet (500 mg total) by mouth daily. 30 tablet 1   Blood Glucose Monitoring Suppl (ACCU-CHEK GUIDE) w/Device KIT 1 Units by Does not apply route in the morning and at bedtime. 1 kit 0   furosemide (LASIX) 40 MG tablet Take 1 tablet (40 mg total) by mouth 2 (two) times  daily. (Patient taking differently: Take 40 mg by mouth daily.) 180 tablet 3   glucose blood (ACCU-CHEK GUIDE) test strip Check bs bid DX: R73.03 100 each 3   Lancets Misc. (ACCU-CHEK SOFTCLIX LANCET DEV) KIT Check bs bid DX: R73.03 1 kit 0   metoprolol succinate (TOPROL-XL) 25 MG 24 hr tablet TAKE ONE TABLET BY MOUTH DAILY 90 tablet 0   Multiple Vitamin (MULTIVITAMIN WITH MINERALS) TABS tablet Take 1 tablet by mouth daily. 30 tablet 1   OXYGEN Inhale 3 L into the lungs. Continous     potassium chloride SA (KLOR-CON) 20 MEQ tablet Take 1 tablet (20 mEq total) by mouth daily. 90 tablet 3   valsartan (DIOVAN) 160 MG tablet Take 1 tablet (160 mg  total) by mouth daily. 90 tablet 3   valsartan (DIOVAN) 80 MG tablet Take 1 tablet (80 mg total) by mouth at bedtime as needed for up to 30 doses. Take if systolic blood pressure (top number) is above 150 mmhg in the evening. 30 tablet 0   No current facility-administered medications on file prior to visit.   Allergies  Allergen Reactions   Sulfa Antibiotics Hives and Shortness Of Breath   Crestor [Rosuvastatin Calcium] Other (See Comments)    Leg cramps   Lipitor [Atorvastatin Calcium] Other (See Comments)    Leg cramps   Tetanus Toxoids Other (See Comments)    Unknown reaction   Social History   Socioeconomic History   Marital status: Single    Spouse name: Not on file   Number of children: Not on file   Years of education: Not on file   Highest education level: Not on file  Occupational History   Not on file  Tobacco Use   Smoking status: Former    Years: 20.00    Types: Cigarettes    Quit date: 05/04/2006    Years since quitting: 15.0   Smokeless tobacco: Former    Types: Chew    Quit date: 05/04/2017  Vaping Use   Vaping Use: Never used  Substance and Sexual Activity   Alcohol use: No   Drug use: No   Sexual activity: Yes  Other Topics Concern   Not on file  Social History Narrative   Married.   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   Social Determinants of Health   Financial Resource Strain: Not on file  Food Insecurity: Not on file  Transportation Needs: Not on file  Physical Activity: Not on file  Stress: Not on file  Social Connections: Not on file  Intimate Partner Violence: Not on file   Family History  Problem Relation Age of Onset   Thyroid cancer Mother    Heart disease Father        MI   Colon cancer Father    Melanoma Father    Benign prostatic hyperplasia Brother    Diabetes Mellitus II Maternal Grandmother    Ovarian cancer Daughter     Review of Systems     Objective:   Physical Exam Vitals reviewed.  Constitutional:      General: He is not in acute  distress.    Appearance: Normal appearance. He is normal weight. He is not ill-appearing or toxic-appearing.  Cardiovascular:     Rate and Rhythm: Normal rate and regular rhythm.     Heart sounds: Normal heart sounds. No murmur heard.   No gallop.  Pulmonary:     Effort: Pulmonary effort is normal. No respiratory distress.     Breath sounds: Normal  breath sounds. No stridor. No wheezing, rhonchi or rales.  Skin:    Findings: Petechiae and rash present.  Neurological:     General: No focal deficit present.     Mental Status: He is alert and oriented to person, place, and time. Mental status is at baseline.     Cranial Nerves: No cranial nerve deficit.    Patient has numerous palpable petechia and purpura on his lower legs.  Each is a probably 2 to 4 mm in diameter.  They seem to be shrinking and improving.  However the patient states he took prednisone that he had at home.  He stopped the prednisone 3 days ago and the rash continues to improve    Assessment & Plan:  Vasculitis ([place]) - Plan: Sedimentation rate, ANA, ANCA Screen Reflex Titer, C3 and C4, Hepatitis C Antibody Although improving, I explained to the patient that vasculitis is due to an autoimmune phenomenon.  I recommended checking a sedimentation rate, ANA, ANCA, C3, C4, and hepatitis C serology.  Patient declines any lab work at the present time.  He wants to see if this will happen again before drawing lab work.  He states that he feels fine and that the rash is going away.

## 2021-05-27 ENCOUNTER — Other Ambulatory Visit: Payer: Self-pay | Admitting: Family Medicine

## 2021-06-14 DIAGNOSIS — N184 Chronic kidney disease, stage 4 (severe): Secondary | ICD-10-CM | POA: Diagnosis not present

## 2021-06-14 DIAGNOSIS — N400 Enlarged prostate without lower urinary tract symptoms: Secondary | ICD-10-CM | POA: Diagnosis not present

## 2021-06-14 DIAGNOSIS — R7303 Prediabetes: Secondary | ICD-10-CM | POA: Diagnosis not present

## 2021-06-14 DIAGNOSIS — I129 Hypertensive chronic kidney disease with stage 1 through stage 4 chronic kidney disease, or unspecified chronic kidney disease: Secondary | ICD-10-CM | POA: Diagnosis not present

## 2021-06-14 DIAGNOSIS — R21 Rash and other nonspecific skin eruption: Secondary | ICD-10-CM | POA: Diagnosis not present

## 2021-07-12 ENCOUNTER — Other Ambulatory Visit: Payer: Self-pay | Admitting: Family Medicine

## 2021-07-16 ENCOUNTER — Telehealth: Payer: Self-pay | Admitting: Pharmacist

## 2021-07-16 NOTE — Progress Notes (Signed)
Chronic Care Management Pharmacy Assistant   Name: Mike Davila  MRN: [id number] DOB: October 11, 1946  Reason for Encounter: Disease State For HTN.   Conditions to be addressed/monitored: Hypertenstion, hyperlipidemia, pre-diabetes  Recent office visits:  05/11/21 Dr. Dennard Schaumann For hospital follow-up STARTED Prednisone 20 mg. 04/30/21 Dr. Dennard Schaumann For hospital follow-up. No medication changes.   Recent consult visits:  None since 03/23/21  Hospital visits:  05/02/21 [facility] Emergency Department Hillburn, Greggory Brandy, MD. For rash No medication changes.  04/21/21 [facility] Trifan, Carola Rhine, MD. For hypertension. STARTED Valsartan 80 mg at bedtime PRN. Per note: Take if systolic blood pressure (top number) is above 150 mmhg in the evening  Medications: Outpatient Encounter Medications as of 07/16/2021  Medication Sig   Accu-Chek Softclix Lancets lancets Check bs bid DX: R73.03   albuterol (VENTOLIN HFA) 108 (90 Base) MCG/ACT inhaler Inhale 2 puffs into the lungs 3 (three) times daily.   alfuzosin (UROXATRAL) 10 MG 24 hr tablet TAKE 1 TABLET BY MOUTH ONCE A DAY.   ascorbic acid (VITAMIN C) 500 MG tablet Take 1 tablet (500 mg total) by mouth daily.   Blood Glucose Monitoring Suppl (ACCU-CHEK GUIDE) w/Device KIT 1 Units by Does not apply route in the morning and at bedtime.   furosemide (LASIX) 40 MG tablet TAKE (1) TABLET BY MOUTH TWICE DAILY.   glucose blood (ACCU-CHEK GUIDE) test strip Check bs bid DX: R73.03   Lancets Misc. (ACCU-CHEK SOFTCLIX LANCET DEV) KIT Check bs bid DX: R73.03   metoprolol succinate (TOPROL-XL) 25 MG 24 hr tablet TAKE ONE TABLET BY MOUTH DAILY   Multiple Vitamin (MULTIVITAMIN WITH MINERALS) TABS tablet Take 1 tablet by mouth daily.   OXYGEN Inhale 3 L into the lungs. Continous   potassium chloride SA (KLOR-CON) 20 MEQ tablet Take 1 tablet (20 mEq total) by mouth daily.   predniSONE (DELTASONE) 20 MG tablet 3 tabs poqday 1-2, 2 tabs poqday 3-4, 1  tab poqday 5-6   valsartan (DIOVAN) 160 MG tablet Take 1 tablet (160 mg total) by mouth daily.   valsartan (DIOVAN) 80 MG tablet Take 1 tablet (80 mg total) by mouth at bedtime as needed for up to 30 doses. Take if systolic blood pressure (top number) is above 150 mmhg in the evening.   No facility-administered encounter medications on file as of 07/16/2021.   Reviewed chart prior to disease state call. Spoke with patient regarding BP  Recent Office Vitals: BP Readings from Last 3 Encounters:  05/11/21 134/78  05/03/21 (!) 135/93  04/30/21 128/84   Pulse Readings from Last 3 Encounters:  05/11/21 74  05/03/21 65  04/30/21 86    Wt Readings from Last 3 Encounters:  05/11/21 205 lb (93 kg)  05/02/21 200 lb (90.7 kg)  04/30/21 200 lb (90.7 kg)     Kidney Function Lab Results  Component Value Date/Time   CREATININE 1.96 (H) 05/03/2021 12:18 AM   CREATININE 1.84 (H) 04/21/2021 01:13 PM   CREATININE 1.99 (H) 04/13/2021 10:15 AM   CREATININE 2.00 (H) 01/04/2021 11:15 AM   GFRNONAA 35 (L) 05/03/2021 12:18 AM   GFRNONAA 32 (L) 04/13/2021 10:15 AM   GFRAA 37 (L) 04/13/2021 10:15 AM    BMP Latest Ref Rng & Units 05/03/2021 04/21/2021 04/13/2021  Glucose 70 - 99 mg/dL 149(H) 99 100(H)  BUN 8 - 23 mg/dL 19 17 19   Creatinine 0.61 - 1.24 mg/dL 1.96(H) 1.84(H) 1.99(H)  BUN/Creat Ratio 6 - 22 (calc) - -  10  Sodium 135 - 145 mmol/L 135 140 141  Potassium 3.5 - 5.1 mmol/L 3.1(L) 3.8 4.1  Chloride 98 - 111 mmol/L 101 106 108  CO2 22 - 32 mmol/L 29 26 17(L)  Calcium 8.9 - 10.3 mg/dL 8.9 9.4 9.4    Current antihypertensive regimen:  Amlodipine 58m daily Metoprolol XL 270mdaily Valsartan 160 mg  Valsartan 80 mg  What recent interventions/DTPs have been made by any provider to improve Blood Pressure control since last CPP Visit: Yes.   Any recent hospitalizations or ED visits since last visit with CPP? Yes, Documented above.   Adherence Review: Is the patient currently on ACE/ARB  medication? Valsartan 160 mg  Does the patient have >5 day gap between last estimated fill dates? Per misc rpts, no.  Care Gaps:Patient is due for his AWV.   Star Rating Drugs: Valsartan 160 mg 05/27/21 90 DS, Valsartan 80 mg 04/21/21 30 DS  Third unsuccessful telephone outreach was attempted today. The patient was referred to the pharmacist for assistance with care management and care coordination.   Follow-Up:Pharmacist Review  VeCharlann LangeRMBloomfieldharmacist Assistant [phone]

## 2021-08-13 ENCOUNTER — Ambulatory Visit (INDEPENDENT_AMBULATORY_CARE_PROVIDER_SITE_OTHER): Payer: Medicare Other

## 2021-08-13 ENCOUNTER — Other Ambulatory Visit: Payer: Self-pay

## 2021-08-13 VITALS — Ht 66.0 in | Wt 205.0 lb

## 2021-08-13 DIAGNOSIS — Z0000 Encounter for general adult medical examination without abnormal findings: Secondary | ICD-10-CM | POA: Diagnosis not present

## 2021-08-13 NOTE — Progress Notes (Signed)
Subjective:   Mike Davila is a 75 y.o. male who presents for Medicare Annual/Subsequent preventive examination. Virtual Visit via Telephone Note  I connected with  Mike Davila on 08/13/21 at 12:00 PM EDT by telephone and verified that I am speaking with the correct person using two identifiers.  Location: Patient: Home  Provider: [facility] Persons participating in the virtual visit: patient/Nurse Health Advisor   I discussed the limitations, risks, security and privacy concerns of performing an evaluation and management service by telephone and the availability of in person appointments. The patient expressed understanding and agreed to proceed.  Interactive audio and video telecommunications were attempted between this nurse and patient, however failed, due to patient having technical difficulties OR patient did not have access to video capability.  We continued and completed visit with audio only.  Some vital signs may be absent or patient reported.   Chriss Driver, LPN  Review of Systems     Cardiac Risk Factors include: advanced age (>79mn, >>20women);hypertension;dyslipidemia;male gender;obesity (BMI >30kg/m2);sedentary lifestyle     Objective:    Today's Vitals   08/13/21 1149  Weight: 205 lb (93 kg)  Height: 5' 6"  (1.676 m)   Body mass index is 33.09 kg/m.  Advanced Directives 08/13/2021 05/02/2021 04/21/2021 01/02/2020 01/02/2020 05/09/2018 10/30/2017  Does Patient Have a Medical Advance Directive? No No No No No No No  Would patient like information on creating a medical advance directive? No - Patient declined No - Patient declined No - Patient declined No - Patient declined No - Patient declined No - Patient declined -    Current Medications (verified) Outpatient Encounter Medications as of 08/13/2021  Medication Sig   Accu-Chek Softclix Lancets lancets Check bs bid DX: R73.03   albuterol (VENTOLIN HFA) 108 (90 Base) MCG/ACT inhaler Inhale 2 puffs into  the lungs 3 (three) times daily.   alfuzosin (UROXATRAL) 10 MG 24 hr tablet TAKE 1 TABLET BY MOUTH ONCE A DAY.   ascorbic acid (VITAMIN C) 500 MG tablet Take 1 tablet (500 mg total) by mouth daily.   Blood Glucose Monitoring Suppl (ACCU-CHEK GUIDE) w/Device KIT 1 Units by Does not apply route in the morning and at bedtime.   furosemide (LASIX) 40 MG tablet TAKE (1) TABLET BY MOUTH TWICE DAILY.   glucose blood (ACCU-CHEK GUIDE) test strip Check bs bid DX: R73.03   Lancets Misc. (ACCU-CHEK SOFTCLIX LANCET DEV) KIT Check bs bid DX: R73.03   metoprolol succinate (TOPROL-XL) 25 MG 24 hr tablet TAKE ONE TABLET BY MOUTH DAILY   Multiple Vitamin (MULTIVITAMIN WITH MINERALS) TABS tablet Take 1 tablet by mouth daily.   OXYGEN Inhale 3 L into the lungs. Continous   potassium chloride SA (KLOR-CON) 20 MEQ tablet Take 1 tablet (20 mEq total) by mouth daily.   predniSONE (DELTASONE) 20 MG tablet 3 tabs poqday 1-2, 2 tabs poqday 3-4, 1 tab poqday 5-6   valsartan (DIOVAN) 160 MG tablet Take 1 tablet (160 mg total) by mouth daily.   valsartan (DIOVAN) 80 MG tablet Take 1 tablet (80 mg total) by mouth at bedtime as needed for up to 30 doses. Take if systolic blood pressure (top number) is above 150 mmhg in the evening.   No facility-administered encounter medications on file as of 08/13/2021.    Allergies (verified) Sulfa antibiotics, Crestor [rosuvastatin calcium], Lipitor [atorvastatin calcium], and Tetanus toxoids   History: Past Medical History:  Diagnosis Date   Anemia    Benign localized prostatic hyperplasia  with lower urinary tract symptoms (LUTS)    CKD (chronic kidney disease), stage III [facility])    nephrologist-  dr Hinda Lenis ([facility] in [place])-- per pt last visit 06/ 2019   Diverticulosis of colon    ED (erectile dysfunction)    History of acute gouty arthritis 10/23/2017   History of acute renal failure 10/23/2017   hospital admission-- w/ acute tubular necrosis superimposed on ckd 3---  resolved   History of diverticulitis of colon 10/05/2017   admission-- mild-- resolved w/ medical management   History of metabolic acidosis 30/04/6225   admission-- resolved   History of palpitations    approx. 2009, per pt no issue since   History of urinary retention 10/2017   Hypertension    Incomplete right bundle branch block    Mixed hyperlipidemia    Nephrolithiasis    left side nonobstructive   Pre-diabetes    Right ureteral stone    Wears dentures    upper   Past Surgical History:  Procedure Laterality Date   ANKLE SURGERY Bilateral 1990s to early 2000s   removal bone spurs   APPENDECTOMY  child   COLONOSCOPY N/A 10/27/2017   Procedure: COLONOSCOPY;  Surgeon: Rogene Houston, MD;  [facility];  Service: Endoscopy;  Laterality: N/A;   CYSTOSCOPY/URETEROSCOPY/HOLMIUM LASER/STENT PLACEMENT Right 05/09/2018   Procedure: CYSTOSCOPY RIGHT URETEROSCOPY/HOLMIUM LASER/STENT PLACEMENT;  Surgeon: Ardis Hughs, MD;  Location: [facility];  Service: Urology;  Laterality: Right;   HEMATOMA EVACUATION  1990s   right calf   INGUINAL HERNIA REPAIR Bilateral age 73s   PLANTAR FASCIA SURGERY Left 2004   TONSILLECTOMY  child   TRANSTHORACIC ECHOCARDIOGRAM  10/24/2017   ef 60-65%,  grade 1 diastolic dysfunction   Family History  Problem Relation Age of Onset   Thyroid cancer Mother    Heart disease Father        MI   Colon cancer Father    Melanoma Father    Benign prostatic hyperplasia Brother    Diabetes Mellitus II Maternal Grandmother    Ovarian cancer Daughter    Social History   Socioeconomic History   Marital status: Single    Spouse name: Not on file   Number of children: Not on file   Years of education: Not on file   Highest education level: Not on file  Occupational History   Not on file  Tobacco Use   Smoking status: Former    Years: 20.00    Types: Cigarettes    Quit date: 05/04/2006    Years since quitting: 15.2    Smokeless tobacco: Former    Types: Chew    Quit date: 05/04/2017  Vaping Use   Vaping Use: Never used  Substance and Sexual Activity   Alcohol use: No   Drug use: No   Sexual activity: Yes  Other Topics Concern   Not on file  Social History Narrative   Married.   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   Social Determinants of Health   Financial Resource Strain: Low Risk    Difficulty of Paying Living Expenses: Not hard at all  Food Insecurity: No Food Insecurity   Worried About Charity fundraiser in the Last Year: Never true   [place] in the Last Year: Never true  Transportation Needs: No Transportation Needs   Lack of Transportation (Medical): No   Lack of Transportation (Non-Medical): No  Physical Activity: Sufficiently Active   Days of Exercise per Week: 5  days   Minutes of Exercise per Session: 30 min  Stress: No Stress Concern Present   Feeling of Stress : Not at all  Social Connections: Unknown   Frequency of Communication with Friends and Family: More than three times a week   Frequency of Social Gatherings with Friends and Family: More than three times a week   Attends Religious Services: Not on Electrical engineer or Organizations: Yes   Attends Music therapist: More than 4 times per year   Marital Status: Married    Tobacco Counseling Counseling given: Not Answered   Clinical Intake:  Pre-visit preparation completed: Yes  Pain : No/denies pain     BMI - recorded: 33.09 Nutritional Status: BMI > 30  Obese Nutritional Risks: None Diabetes: No  How often do you need to have someone help you when you read instructions, pamphlets, or other written materials from your doctor or pharmacy?: 1 - Never  Diabetic?Pre-diabetes. Checks blood sugars but is not on medications per pt.   Interpreter Needed?: No  Information entered by :: MJ Rande Roylance,LPN   Activities of Daily Living In your present state of health, do you have any difficulty performing  the following activities: 08/13/2021 01/04/2021  Hearing? N N  Vision? N N  Difficulty concentrating or making decisions? N N  Walking or climbing stairs? N N  Dressing or bathing? N N  Doing errands, shopping? N N  Preparing Food and eating ? N -  Using the Toilet? N -  In the past six months, have you accidently leaked urine? N -  Do you have problems with loss of bowel control? N -  Managing your Medications? N -  Managing your Finances? N -  Housekeeping or managing your Housekeeping? N -  Some recent data might be hidden    Patient Care Team: [name], MD as PCP - General (Family Medicine) Edythe Clarity, [facility] as Pharmacist (Pharmacist)  Indicate any recent Medical Services you may have received from other than [facility] providers in the past year (date may be approximate).     Assessment:   This is a routine wellness examination for [place].  Hearing/Vision screen Hearing Screening - Comments:: Some hearing issues.  Vision Screening - Comments:: Declines eye MD referral.   Dietary issues and exercise activities discussed: Current Exercise Habits: Home exercise routine, Type of exercise: walking, Time (Minutes): 30, Frequency (Times/Week): 5, Weekly Exercise (Minutes/Week): 150, Intensity: Mild, Exercise limited by: cardiac condition(s)   Goals Addressed             This Visit's Progress    Exercise 3x per week (30 min per time)       Pt state he wants to stay healthy.       Depression Screen PHQ 2/9 Scores 08/13/2021 01/04/2021 10/30/2018 10/23/2017 09/09/2015 06/12/2014  PHQ - 2 Score 0 0 0 0 0 0    Fall Risk Fall Risk  08/13/2021 01/04/2021 10/30/2018 10/23/2017 09/09/2015  Falls in the past year? 0 0 0 No No  Number falls in past yr: 0 0 - - -  Injury with Fall? 0 0 - - -  Risk for fall due to : No Fall Risks - - - -  Follow up Falls prevention discussed Falls evaluation completed Falls evaluation completed - -    FALL RISK PREVENTION PERTAINING TO  THE HOME:  Any stairs in or around the home? Yes  If so, are there any without  handrails? No  Home free of loose throw rugs in walkways, pet beds, electrical cords, etc? Yes  Adequate lighting in your home to reduce risk of falls? Yes   ASSISTIVE DEVICES UTILIZED TO PREVENT FALLS:  Life alert? No  Use of a cane, walker or w/c? No  Grab bars in the bathroom? Yes  Shower chair or bench in shower? Yes  Elevated toilet seat or a handicapped toilet? Yes   TIMED UP AND GO:  Was the test performed? No .    Cognitive Function:     6CIT Screen 08/13/2021  What Year? 0 points  What month? 0 points  What time? 0 points  Count back from 20 0 points  Months in reverse 0 points  Repeat phrase 0 points  Total Score 0    Immunizations  There is no immunization history on file for this patient.  TDAP status: Due, Education has been provided regarding the importance of this vaccine. Advised may receive this vaccine at local pharmacy or Health Dept. Aware to provide a copy of the vaccination record if obtained from local pharmacy or Health Dept. Verbalized acceptance and understanding. Pt declines due to Tetanus allergy.  Flu Vaccine status: Declined, Education has been provided regarding the importance of this vaccine but patient still declined. Advised may receive this vaccine at local pharmacy or Health Dept. Aware to provide a copy of the vaccination record if obtained from local pharmacy or Health Dept. Verbalized acceptance and understanding.  Pneumococcal vaccine status: Declined,  Education has been provided regarding the importance of this vaccine but patient still declined. Advised may receive this vaccine at local pharmacy or Health Dept. Aware to provide a copy of the vaccination record if obtained from local pharmacy or Health Dept. Verbalized acceptance and understanding.   Covid-19 vaccine status: Information provided on how to obtain vaccines.   Qualifies for Shingles  Vaccine? Yes   Zostavax completed No   Shingrix Completed?: No.    Education has been provided regarding the importance of this vaccine. Patient has been advised to call insurance company to determine out of pocket expense if they have not yet received this vaccine. Advised may also receive vaccine at local pharmacy or Health Dept. Verbalized acceptance and understanding.  Screening Tests Health Maintenance  Topic Date Due   COVID-19 Vaccine (1) Never done   Pneumonia Vaccine 29+ Years old (1 - PCV) Never done   Zoster Vaccines- Shingrix (1 of 2) Never done   INFLUENZA VACCINE  Never done   TETANUS/TDAP  12/23/2034 (Originally 07/08/1965)   COLONOSCOPY (Pts 45-59yr Insurance coverage will need to be confirmed)  10/28/2027   Hepatitis C Screening  Completed   HPV VACCINES  Aged Out    Health Maintenance  Health Maintenance Due  Topic Date Due   COVID-19 Vaccine (1) Never done   Pneumonia Vaccine 74 Years old (1 - PCV) Never done   Zoster Vaccines- Shingrix (1 of 2) Never done   INFLUENZA VACCINE  Never done    Colorectal cancer screening: Type of screening: Colonoscopy. Completed 10/27/2017. Repeat every 10 years  Lung Cancer Screening: (Low Dose CT Chest recommended if Age 74-80years, 30 pack-year currently smoking OR have quit w/in 15years.) does not qualify.    Additional Screening:  Hepatitis C Screening: does qualify; Completed 10/24/2017  Vision Screening: Recommended annual ophthalmology exams for early detection of glaucoma and other disorders of the eye. Is the patient up to date with their annual eye exam?  No  Who is the provider or what is the name of the office in which the patient attends annual eye exams? N/A If pt is not established with a provider, would they like to be referred to a provider to establish care? No .   Dental Screening: Recommended annual dental exams for proper oral hygiene  Community Resource Referral / Chronic Care Management: CRR required  this visit?  No   CCM required this visit?  No      Plan:     I have personally reviewed and noted the following in the patient's chart:   Medical and social history Use of alcohol, tobacco or illicit drugs  Current medications and supplements including opioid prescriptions. Patient is not currently taking opioid prescriptions. Functional ability and status Nutritional status Physical activity Advanced directives List of other physicians Hospitalizations, surgeries, and ER visits in previous 12 months Vitals Screenings to include cognitive, depression, and falls Referrals and appointments  In addition, I have reviewed and discussed with patient certain preventive protocols, quality metrics, and best practice recommendations. A written personalized care plan for preventive services as well as general preventive health recommendations were provided to patient.     Chriss Driver, LPN   [id number]   Nurse Notes: Pt states he is doing well. Up to date on Colonoscopy. Pt declines vaccines. Pt states he did have 2 Covid vaccines. Asked pt to bring copy of immunization card for documentation. Pt agrees. Pt declines eye exam. 6CIT score of 2.

## 2021-08-13 NOTE — Patient Instructions (Signed)
[name] , Thank you for taking time to come for your Medicare Wellness Visit. I appreciate your ongoing commitment to your health goals. Please review the following plan we discussed and let me know if I can assist you in the future.   Screening recommendations/referrals: Colonoscopy: Done 10/27/2017 Repeat in 10 years  Recommended yearly ophthalmology/optometry visit for glaucoma screening and checkup Recommended yearly dental visit for hygiene and checkup  Vaccinations: Influenza vaccine: Declined.  Pneumococcal vaccine: Declined. Tdap vaccine: Allergy to Tetanus. Shingles vaccine: Shingrix discussed. Please contact your pharmacy for coverage information.     Covid-19: Done x 2 doses. Please bring a copy of your Covid Immunization card to your next visit, so we can update your chart.  Advanced directives: Advance directive discussed with you today. Even though you declined this today, please call our office should you change your mind, and we can give you the proper paperwork for you to fill out.   Conditions/risks identified: Aim for 30 minutes of exercise or brisk walking each day, drink 6-8 glasses of water and eat lots of fruits and vegetables.   Next appointment: Follow up in one year for your annual wellness visit. 2023.  Preventive Care 74 Years and Older, Male  Preventive care refers to lifestyle choices and visits with your health care provider that can promote health and wellness. What does preventive care include? A yearly physical exam. This is also called an annual well check. Dental exams once or twice a year. Routine eye exams. Ask your health care provider how often you should have your eyes checked. Personal lifestyle choices, including: Daily care of your teeth and gums. Regular physical activity. Eating a healthy diet. Avoiding tobacco and drug use. Limiting alcohol use. Practicing safe sex. Taking low doses of aspirin every day. Taking vitamin and  mineral supplements as recommended by your health care provider. What happens during an annual well check? The services and screenings done by your health care provider during your annual well check will depend on your age, overall health, lifestyle risk factors, and family history of disease. Counseling  Your health care provider may ask you questions about your: Alcohol use. Tobacco use. Drug use. Emotional well-being. Home and relationship well-being. Sexual activity. Eating habits. History of falls. Memory and ability to understand (cognition). Work and work Statistician. Screening  You may have the following tests or measurements: Height, weight, and BMI. Blood pressure. Lipid and cholesterol levels. These may be checked every 5 years, or more frequently if you are over 18 years old. Skin check. Lung cancer screening. You may have this screening every year starting at age 74 if you have a 30-pack-year history of smoking and currently smoke or have quit within the past 15 years. Fecal occult blood test (FOBT) of the stool. You may have this test every year starting at age 74. Flexible sigmoidoscopy or colonoscopy. You may have a sigmoidoscopy every 5 years or a colonoscopy every 10 years starting at age 74. Prostate cancer screening. Recommendations will vary depending on your family history and other risks. Hepatitis C blood test. Hepatitis B blood test. Sexually transmitted disease (STD) testing. Diabetes screening. This is done by checking your blood sugar (glucose) after you have not eaten for a while (fasting). You may have this done every 1-3 years. Abdominal aortic aneurysm (AAA) screening. You may need this if you are a current or former smoker. Osteoporosis. You may be screened starting at age 74 if you are at high risk. Talk  with your health care provider about your test results, treatment options, and if necessary, the need for more tests. Vaccines  Your health care  provider may recommend certain vaccines, such as: Influenza vaccine. This is recommended every year. Tetanus, diphtheria, and acellular pertussis (Tdap, Td) vaccine. You may need a Td booster every 10 years. Zoster vaccine. You may need this after age 74. Pneumococcal 13-valent conjugate (PCV13) vaccine. One dose is recommended after age 74. Pneumococcal polysaccharide (PPSV23) vaccine. One dose is recommended after age 74. Talk to your health care provider about which screenings and vaccines you need and how often you need them. This information is not intended to replace advice given to you by your health care provider. Make sure you discuss any questions you have with your health care provider. Document Released: 11/06/2015 Document Revised: 06/29/2016 Document Reviewed: 08/11/2015 Elsevier Interactive Patient Education  2017 [place] Prevention in the Home Falls can cause injuries. They can happen to people of all ages. There are many things you can do to make your home safe and to help prevent falls. What can I do on the outside of my home? Regularly fix the edges of walkways and driveways and fix any cracks. Remove anything that might make you trip as you walk through a door, such as a raised step or threshold. Trim any bushes or trees on the path to your home. Use bright outdoor lighting. Clear any walking paths of anything that might make someone trip, such as rocks or tools. Regularly check to see if handrails are loose or broken. Make sure that both sides of any steps have handrails. Any raised decks and porches should have guardrails on the edges. Have any leaves, snow, or ice cleared regularly. Use sand or salt on walking paths during winter. Clean up any spills in your garage right away. This includes oil or grease spills. What can I do in the bathroom? Use night lights. Install grab bars by the toilet and in the tub and shower. Do not use towel bars as grab  bars. Use non-skid mats or decals in the tub or shower. If you need to sit down in the shower, use a plastic, non-slip stool. Keep the floor dry. Clean up any water that spills on the floor as soon as it happens. Remove soap buildup in the tub or shower regularly. Attach bath mats securely with double-sided non-slip rug tape. Do not have throw rugs and other things on the floor that can make you trip. What can I do in the bedroom? Use night lights. Make sure that you have a light by your bed that is easy to reach. Do not use any sheets or blankets that are too big for your bed. They should not hang down onto the floor. Have a firm chair that has side arms. You can use this for support while you get dressed. Do not have throw rugs and other things on the floor that can make you trip. What can I do in the kitchen? Clean up any spills right away. Avoid walking on wet floors. Keep items that you use a lot in easy-to-reach places. If you need to reach something above you, use a strong step stool that has a grab bar. Keep electrical cords out of the way. Do not use floor polish or wax that makes floors slippery. If you must use wax, use non-skid floor wax. Do not have throw rugs and other things on the floor that can make you  trip. What can I do with my stairs? Do not leave any items on the stairs. Make sure that there are handrails on both sides of the stairs and use them. Fix handrails that are broken or loose. Make sure that handrails are as long as the stairways. Check any carpeting to make sure that it is firmly attached to the stairs. Fix any carpet that is loose or worn. Avoid having throw rugs at the top or bottom of the stairs. If you do have throw rugs, attach them to the floor with carpet tape. Make sure that you have a light switch at the top of the stairs and the bottom of the stairs. If you do not have them, ask someone to add them for you. What else can I do to help prevent  falls? Wear shoes that: Do not have high heels. Have rubber bottoms. Are comfortable and fit you well. Are closed at the toe. Do not wear sandals. If you use a stepladder: Make sure that it is fully opened. Do not climb a closed stepladder. Make sure that both sides of the stepladder are locked into place. Ask someone to hold it for you, if possible. Clearly mark and make sure that you can see: Any grab bars or handrails. First and last steps. Where the edge of each step is. Use tools that help you move around (mobility aids) if they are needed. These include: Canes. Walkers. Scooters. Crutches. Turn on the lights when you go into a dark area. Replace any light bulbs as soon as they burn out. Set up your furniture so you have a clear path. Avoid moving your furniture around. If any of your floors are uneven, fix them. If there are any pets around you, be aware of where they are. Review your medicines with your doctor. Some medicines can make you feel dizzy. This can increase your chance of falling. Ask your doctor what other things that you can do to help prevent falls. This information is not intended to replace advice given to you by your health care provider. Make sure you discuss any questions you have with your health care provider. Document Released: 08/06/2009 Document Revised: 03/17/2016 Document Reviewed: 11/14/2014 Elsevier Interactive Patient Education  2017 [employer] Inc[employer]  

## 2021-08-25 DIAGNOSIS — L820 Inflamed seborrheic keratosis: Secondary | ICD-10-CM | POA: Diagnosis not present

## 2021-08-25 DIAGNOSIS — B078 Other viral warts: Secondary | ICD-10-CM | POA: Diagnosis not present

## 2021-08-25 DIAGNOSIS — Z85828 Personal history of other malignant neoplasm of skin: Secondary | ICD-10-CM | POA: Diagnosis not present

## 2021-08-25 DIAGNOSIS — Z08 Encounter for follow-up examination after completed treatment for malignant neoplasm: Secondary | ICD-10-CM | POA: Diagnosis not present

## 2021-09-08 ENCOUNTER — Other Ambulatory Visit: Payer: Self-pay | Admitting: Family Medicine

## 2021-10-09 ENCOUNTER — Other Ambulatory Visit: Payer: Self-pay | Admitting: Family Medicine

## 2021-11-25 ENCOUNTER — Other Ambulatory Visit: Payer: Self-pay | Admitting: Family Medicine

## 2021-11-25 DIAGNOSIS — I10 Essential (primary) hypertension: Secondary | ICD-10-CM

## 2021-12-29 ENCOUNTER — Other Ambulatory Visit: Payer: Self-pay | Admitting: Family Medicine

## 2021-12-30 ENCOUNTER — Telehealth: Payer: Self-pay | Admitting: *Deleted

## 2021-12-30 NOTE — Chronic Care Management (AMB) (Signed)
?  Care Management  ? ?Note ? ?12/30/2021 ?Name: Mike Davila MRN: [id number] DOB: Apr 14, 1946 ? ?Mike Davila is a 76 y.o. year old male who is a primary care patient of Pickard, Cammie Mcgee, MD. I reached out to [name] by phone today in response to a referral sent by [name] primary care provider.  ? ?Mike Davila was given information about care management services today including:  ?Care management services include personalized support from designated clinical staff supervised by his physician, including individualized plan of care and coordination with other care providers ?24/7 contact phone numbers for assistance for urgent and routine care needs. ?The patient may stop care management services at any time by phone call to the office staff. ? ?Patient agreed to services and verbal consent obtained.  ? ?Follow up plan: ?Telephone appointment with care management team member scheduled for:01/03/22 ? ?Laverda Sorenson  ?Care Guide, Embedded Care Coordination ?[facility]  Care Management  ?Direct Dial: [phone] ? ?

## 2022-01-03 ENCOUNTER — Ambulatory Visit (INDEPENDENT_AMBULATORY_CARE_PROVIDER_SITE_OTHER): Payer: Medicare Other | Admitting: *Deleted

## 2022-01-03 DIAGNOSIS — E785 Hyperlipidemia, unspecified: Secondary | ICD-10-CM

## 2022-01-03 DIAGNOSIS — I10 Essential (primary) hypertension: Secondary | ICD-10-CM

## 2022-01-03 NOTE — Chronic Care Management (AMB) (Signed)
Chronic Care Management   CCM RN Visit Note  01/03/2022 Name: Mike Davila MRN: [id number] DOB: Feb 14, 1946  Subjective: Mike Davila is a 76 y.o. year old male who is a primary care patient of Pickard, Cammie Mcgee, MD. The care management team was consulted for assistance with disease management and care coordination needs.    Engaged with patient by telephone for initial visit in response to provider referral for case management and/or care coordination services.   Consent to Services:  The patient was given the following information about Chronic Care Management services today, agreed to services, and gave verbal consent: 1. CCM service includes personalized support from designated clinical staff supervised by the primary care provider, including individualized plan of care and coordination with other care providers 2. 24/7 contact phone numbers for assistance for urgent and routine care needs. 3. Service will only be billed when office clinical staff spend 20 minutes or more in a month to coordinate care. 4. Only one practitioner may furnish and bill the service in a calendar month. 5.The patient may stop CCM services at any time (effective at the end of the month) by phone call to the office staff. 6. The patient will be responsible for cost sharing (co-pay) of up to 20% of the service fee (after annual deductible is met). Patient agreed to services and consent obtained.  Patient agreed to services and verbal consent obtained.   Assessment: Review of patient past medical history, allergies, medications, health status, including review of consultants reports, laboratory and other test data, was performed as part of comprehensive evaluation and provision of chronic care management services.   SDOH (Social Determinants of Health) assessments and interventions performed:  SDOH Interventions    Flowsheet Row Most Recent Value  SDOH Interventions   Food Insecurity Interventions  Intervention Not Indicated  Transportation Interventions Intervention Not Indicated        CCM Care Plan  Allergies  Allergen Reactions   Sulfa Antibiotics Hives and Shortness Of Breath   Crestor [Rosuvastatin Calcium] Other (See Comments)    Leg cramps   Lipitor [Atorvastatin Calcium] Other (See Comments)    Leg cramps   Tetanus Toxoids Other (See Comments)    Unknown reaction    Outpatient Encounter Medications as of 01/03/2022  Medication Sig   Accu-Chek Softclix Lancets lancets Check bs bid DX: R73.03   alfuzosin (UROXATRAL) 10 MG 24 hr tablet TAKE 1 TABLET BY MOUTH ONCE A DAY.   ascorbic acid (VITAMIN C) 500 MG tablet Take 1 tablet (500 mg total) by mouth daily.   Blood Glucose Monitoring Suppl (ACCU-CHEK GUIDE) w/Device KIT 1 Units by Does not apply route in the morning and at bedtime.   furosemide (LASIX) 40 MG tablet TAKE (1) TABLET BY MOUTH TWICE DAILY.   glucose blood (ACCU-CHEK GUIDE) test strip Check bs bid DX: R73.03   Lancets Misc. (ACCU-CHEK SOFTCLIX LANCET DEV) KIT Check bs bid DX: R73.03   metoprolol succinate (TOPROL-XL) 25 MG 24 hr tablet TAKE ONE TABLET BY MOUTH DAILY   Multiple Vitamin (MULTIVITAMIN WITH MINERALS) TABS tablet Take 1 tablet by mouth daily.   valsartan (DIOVAN) 160 MG tablet TAKE ONE TABLET BY MOUTH ONCE DAILY.   albuterol (VENTOLIN HFA) 108 (90 Base) MCG/ACT inhaler Inhale 2 puffs into the lungs 3 (three) times daily. (Patient not taking: Reported on 01/03/2022)   OXYGEN Inhale 3 L into the lungs. Continous (Patient not taking: Reported on 01/03/2022)   potassium chloride SA (KLOR-CON) 20 MEQ tablet  Take 1 tablet (20 mEq total) by mouth daily. (Patient not taking: Reported on 01/03/2022)   predniSONE (DELTASONE) 20 MG tablet 3 tabs poqday 1-2, 2 tabs poqday 3-4, 1 tab poqday 5-6 (Patient not taking: Reported on 01/03/2022)   valsartan (DIOVAN) 80 MG tablet Take 1 tablet (80 mg total) by mouth at bedtime as needed for up to 30 doses. Take if systolic  blood pressure (top number) is above 150 mmhg in the evening. (Patient not taking: Reported on 01/03/2022)   No facility-administered encounter medications on file as of 01/03/2022.    Patient Active Problem List   Diagnosis Date Noted   Class 1 obesity due to excess calories with body mass index (BMI) of 33.0 to 33.9 in adult    Acute respiratory failure with hypoxia ([place])    Pneumonia due to COVID-19 virus 01/02/2020   CKD (chronic kidney disease), stage III ([place]) 01/02/2020   Prediabetes 01/02/2020   Hearing loss of right ear due to cerumen impaction 11/28/2017   Acute renal failure superimposed on stage 4 chronic kidney disease ([place]) 10/28/2017   Diverticulosis of colon 10/28/2017   Weight loss, unintentional 67/20/9470   Metabolic acidosis with normal anion gap and bicarbonate losses 10/28/2017   Diverticulitis large intestine 10/24/2017   Hypercalcemia 10/24/2017   Anemia 10/23/2017   Hyperkalemia 10/23/2017   Screening for prostate cancer 12/23/2014   Hyperlipidemia    Hypertension    Hyperglycemia    Hypertriglyceridemia     Conditions to be addressed/monitored:HTN and HLD  Care Plan : RN Care Manager Plan of Care  Updates made by Kassie Mends, RN since 01/03/2022 12:00 AM     Problem: No plan of care established for management of chronic disease state  (HTN, HLD)   Priority: High     Long-Range Goal: Development of plan of care for chronic disease management  (HTN, HLD)   Start Date: 01/03/2022  Expected End Date: 07/02/2022  Priority: High  Note:   Current Barriers:  Knowledge Deficits related to plan of care for management of HTN and HLD  Patient reports he lives with spouse and is independent with all aspects of his care, continues to drive, CCM pharmacist is involved with patient, reports checks blood pressure 2 x week " readings have been good, usually a little higher in the morning", reports checks blood sugar on occasion with readings in low 100's due to  diagnosis of pre diabetes. No advanced directives in place and pt declines information  RNCM Clinical Goal(s):  Patient will verbalize understanding of plan for management of HTN and HLD as evidenced by patient report, review of EHR and  through collaboration with RN Care manager, provider, and care team.   Interventions: 1:1 collaboration with primary care provider regarding development and update of comprehensive plan of care as evidenced by provider attestation and co-signature Inter-disciplinary care team collaboration (see longitudinal plan of care) Evaluation of current treatment plan related to  self management and patient's adherence to plan as established by provider   Hyperlipidemia:  (Status: New goal. Goal on Track (progressing): YES.) Long Term Goal  Lab Results  Component Value Date   CHOL 181 01/04/2021   HDL 35 (L) 01/04/2021   LDLCALC 113 (H) 01/04/2021   TRIG 213 (H) 01/04/2021   CHOLHDL 5.2 (H) 01/04/2021     Medication review performed; medication list updated in electronic medical record.  Provider established cholesterol goals reviewed; Counseled on importance of regular laboratory monitoring as prescribed; Reviewed exercise goals  and target of 150 minutes per week; Screening for signs and symptoms of depression related to chronic disease state;  Assessed social determinant of health barriers;  Pain assessment completed Provided education on heart healthy diet  Hypertension Interventions:  (Status:  New goal. and Goal on track:  Yes.) Long Term Goal Last practice recorded BP readings:  BP Readings from Last 3 Encounters:  05/11/21 134/78  05/03/21 (!) 135/93  04/30/21 128/84  Most recent eGFR/CrCl: No results found for: EGFR  No components found for: CRCL  Evaluation of current treatment plan related to hypertension self management and patient's adherence to plan as established by provider Reviewed medications with patient and discussed importance of  compliance Advised patient, providing education and rationale, to monitor blood pressure daily and record, calling PCP for findings outside established parameters Discussed complications of poorly controlled blood pressure such as heart disease, stroke, circulatory complications, vision complications, kidney impairment, sexual dysfunction  Provided education on low sodium diet  Patient Goals/Self-Care Activities: Take medications as prescribed   Attend all scheduled provider appointments Call pharmacy for medication refills 3-7 days in advance of running out of medications Perform all self care activities independently  Perform IADL's (shopping, preparing meals, housekeeping, managing finances) independently Call provider office for new concerns or questions  Continue checking blood pressure at least 2 x per week and keeping a log Please take blood pressure log to doctor's visits Continue checking blood sugar as you are doing and keep a log Follow heart healthy diet Look over education sent on after visit summary- low sodium diet and heart healthy diet       Plan:Telephone follow up appointment with care management team member scheduled for:  03/14/22  Jacqlyn Larsen [facility], BSN RN Case Manager [facility] [phone]

## 2022-01-03 NOTE — Patient Instructions (Signed)
Visit Information   Thank you for taking time to visit with me today. Please don't hesitate to contact me if I can be of assistance to you before our next scheduled telephone appointment.  Following are the goals we discussed today:  Take medications as prescribed   Attend all scheduled provider appointments Call pharmacy for medication refills 3-7 days in advance of running out of medications Perform all self care activities independently  Perform IADL's (shopping, preparing meals, housekeeping, managing finances) independently Call provider office for new concerns or questions  Continue checking blood pressure at least 2 x per week and keeping a log Please take blood pressure log to doctor's visits Continue checking blood sugar as you are doing and keep a log Follow heart healthy diet Look over education sent on after visit summary- low sodium diet and heart healthy diet  Heart-Healthy Eating Plan Heart-healthy meal planning includes: Eating less unhealthy fats. Eating more healthy fats. Making other changes in your diet. Talk with your doctor or a diet specialist (dietitian) to create an eating plan that is right for you. What is my plan? Your doctor may recommend an eating plan that includes: Total fat: ______% or less of total calories a day. Saturated fat: ______% or less of total calories a day. Cholesterol: less than _________mg a day. What are tips for following this plan? Cooking Avoid frying your food. Try to bake, boil, grill, or broil it instead. You can also reduce fat by: Removing the skin from poultry. Removing all visible fats from meats. Steaming vegetables in water or broth. Meal planning  At meals, divide your plate into four equal parts: Fill one-half of your plate with vegetables and green salads. Fill one-fourth of your plate with whole grains. Fill one-fourth of your plate with lean protein foods. Eat 4-5 servings of vegetables per day. A serving of  vegetables is: 1 cup of raw or cooked vegetables. 2 cups of raw leafy greens. Eat 4-5 servings of fruit per day. A serving of fruit is: 1 medium whole fruit.  cup of dried fruit.  cup of fresh, frozen, or canned fruit.  cup of 100% fruit juice. Eat more foods that have soluble fiber. These are apples, broccoli, carrots, beans, peas, and barley. Try to get 20-30 g of fiber per day. Eat 4-5 servings of nuts, legumes, and seeds per week: 1 serving of dried beans or legumes equals  cup after being cooked. 1 serving of nuts is  cup. 1 serving of seeds equals 1 tablespoon. General information Eat more home-cooked food. Eat less restaurant, buffet, and fast food. Limit or avoid alcohol. Limit foods that are high in starch and sugar. Avoid fried foods. Lose weight if you are overweight. Keep track of how much salt (sodium) you eat. This is important if you have high blood pressure. Ask your doctor to tell you more about this. Try to add vegetarian meals each week. Fats Choose healthy fats. These include olive oil and canola oil, flaxseeds, walnuts, almonds, and seeds. Eat more omega-3 fats. These include salmon, mackerel, sardines, tuna, flaxseed oil, and ground flaxseeds. Try to eat fish at least 2 times each week. Check food labels. Avoid foods with trans fats or high amounts of saturated fat. Limit saturated fats. These are often found in animal products, such as meats, butter, and cream. These are also found in plant foods, such as palm oil, palm kernel oil, and coconut oil. Avoid foods with partially hydrogenated oils in them. These have trans  fats. Examples are stick margarine, some tub margarines, cookies, crackers, and other baked goods. What foods can I eat? Fruits All fresh, canned (in natural juice), or frozen fruits. Vegetables Fresh or frozen vegetables (raw, steamed, roasted, or grilled). Green salads. Grains Most grains. Choose whole wheat and whole grains most of the  time. Rice and pasta, including brown rice and pastas made with whole wheat. Meats and other proteins Lean, well-trimmed beef, veal, pork, and lamb. Chicken and [place] without skin. All fish and shellfish. Wild duck, rabbit, pheasant, and venison. Egg whites or low-cholesterol egg substitutes. Dried beans, peas, lentils, and tofu. Seeds and most nuts. Dairy Low-fat or nonfat cheeses, including ricotta and mozzarella. Skim or 1% milk that is liquid, powdered, or evaporated. Buttermilk that is made with low-fat milk. Nonfat or low-fat yogurt. Fats and oils Non-hydrogenated (trans-free) margarines. Vegetable oils, including soybean, sesame, sunflower, olive, peanut, safflower, corn, canola, and cottonseed. Salad dressings or mayonnaise made with a vegetable oil. Beverages Mineral water. Coffee and tea. Diet carbonated beverages. Sweets and desserts Sherbet, gelatin, and fruit ice. Small amounts of dark chocolate. Limit all sweets and desserts. Seasonings and condiments All seasonings and condiments. The items listed above may not be a complete list of foods and drinks you can eat. Contact a dietitian for more options. What foods should I avoid? Fruits Canned fruit in heavy syrup. Fruit in cream or butter sauce. Fried fruit. Limit coconut. Vegetables Vegetables cooked in cheese, cream, or butter sauce. Fried vegetables. Grains Breads that are made with saturated or trans fats, oils, or whole milk. Croissants. Sweet rolls. Donuts. High-fat crackers, such as cheese crackers. Meats and other proteins Fatty meats, such as hot dogs, ribs, sausage, bacon, rib-eye roast or steak. High-fat deli meats, such as salami and bologna. Caviar. Domestic duck and goose. Organ meats, such as liver. Dairy Cream, sour cream, cream cheese, and creamed cottage cheese. Whole-milk cheeses. Whole or 2% milk that is liquid, evaporated, or condensed. Whole buttermilk. Cream sauce or high-fat cheese sauce. Yogurt that is  made from whole milk. Fats and oils Meat fat, or shortening. Cocoa butter, hydrogenated oils, palm oil, coconut oil, palm kernel oil. Solid fats and shortenings, including bacon fat, salt pork, lard, and butter. Nondairy cream substitutes. Salad dressings with cheese or sour cream. Beverages Regular sodas and juice drinks with added sugar. Sweets and desserts Frosting. Pudding. Cookies. Cakes. Pies. Milk chocolate or white chocolate. Buttered syrups. Full-fat ice cream or ice cream drinks. The items listed above may not be a complete list of foods and drinks to avoid. Contact a dietitian for more information. Summary Heart-healthy meal planning includes eating less unhealthy fats, eating more healthy fats, and making other changes in your diet. Eat a balanced diet. This includes fruits and vegetables, low-fat or nonfat dairy, lean protein, nuts and legumes, whole grains, and heart-healthy oils and fats. This information is not intended to replace advice given to you by your health care provider. Make sure you discuss any questions you have with your health care provider. Document Revised: 02/18/2021 Document Reviewed: 02/18/2021 Elsevier Patient Education  2022 [place].  Low-Sodium Eating Plan Sodium, which is an element that makes up salt, helps you maintain a healthy balance of fluids in your body. Too much sodium can increase your blood pressure and cause fluid and waste to be held in your body. Your health care provider or dietitian may recommend following this plan if you have high blood pressure (hypertension), kidney disease, liver disease, or heart  failure. Eating less sodium can help lower your blood pressure, reduce swelling, and protect your heart, liver, and kidneys. What are tips for following this plan? Reading food labels The Nutrition Facts label lists the amount of sodium in one serving of the food. If you eat more than one serving, you must multiply the listed amount of  sodium by the number of servings. Choose foods with less than 140 mg of sodium per serving. Avoid foods with 300 mg of sodium or more per serving. Shopping  Look for lower-sodium products, often labeled as "low-sodium" or "no salt added." Always check the sodium content, even if foods are labeled as "unsalted" or "no salt added." Buy fresh foods. Avoid canned foods and pre-made or frozen meals. Avoid canned, cured, or processed meats. Buy breads that have less than 80 mg of sodium per slice. Cooking  Eat more home-cooked food and less restaurant, buffet, and fast food. Avoid adding salt when cooking. Use salt-free seasonings or herbs instead of table salt or sea salt. Check with your health care provider or pharmacist before using salt substitutes. Cook with plant-based oils, such as canola, sunflower, or olive oil. Meal planning When eating at a restaurant, ask that your food be prepared with less salt or no salt, if possible. Avoid dishes labeled as brined, pickled, cured, smoked, or made with soy sauce, miso, or teriyaki sauce. Avoid foods that contain MSG (monosodium glutamate). MSG is sometimes added to [place] food, bouillon, and some canned foods. Make meals that can be grilled, baked, poached, roasted, or steamed. These are generally made with less sodium. General information Most people on this plan should limit their sodium intake to 1,500-2,000 mg (milligrams) of sodium each day. What foods should I eat? Fruits Fresh, frozen, or canned fruit. Fruit juice. Vegetables Fresh or frozen vegetables. "No salt added" canned vegetables. "No salt added" tomato sauce and paste. Low-sodium or reduced-sodium tomato and vegetable juice. Grains Low-sodium cereals, including oats, puffed wheat and rice, and shredded wheat. Low-sodium crackers. Unsalted rice. Unsalted pasta. Low-sodium bread. Whole-grain breads and whole-grain pasta. Meats and other proteins Fresh or frozen (no salt added)  meat, poultry, seafood, and fish. Low-sodium canned tuna and salmon. Unsalted nuts. Dried peas, beans, and lentils without added salt. Unsalted canned beans. Eggs. Unsalted nut butters. Dairy Milk. Soy milk. Cheese that is naturally low in sodium, such as ricotta cheese, fresh mozzarella, or Swiss cheese. Low-sodium or reduced-sodium cheese. Cream cheese. Yogurt. Seasonings and condiments Fresh and dried herbs and spices. Salt-free seasonings. Low-sodium mustard and ketchup. Sodium-free salad dressing. Sodium-free light mayonnaise. Fresh or refrigerated horseradish. Lemon juice. Vinegar. Other foods Homemade, reduced-sodium, or low-sodium soups. Unsalted popcorn and pretzels. Low-salt or salt-free chips. The items listed above may not be a complete list of foods and beverages you can eat. Contact a dietitian for more information. What foods should I avoid? Vegetables Sauerkraut, pickled vegetables, and relishes. Olives. [place] fries. Onion rings. Regular canned vegetables (not low-sodium or reduced-sodium). Regular canned tomato sauce and paste (not low-sodium or reduced-sodium). Regular tomato and vegetable juice (not low-sodium or reduced-sodium). Frozen vegetables in sauces. Grains Instant hot cereals. Bread stuffing, pancake, and biscuit mixes. Croutons. Seasoned rice or pasta mixes. Noodle soup cups. Boxed or frozen macaroni and cheese. Regular salted crackers. Self-rising flour. Meats and other proteins Meat or fish that is salted, canned, smoked, spiced, or pickled. Precooked or cured meat, such as sausages or meat loaves. [place] Salines. Ham. Pepperoni. Hot dogs. Corned beef. Chipped beef. Salt pork. Jerky.  Pickled herring. Anchovies and sardines. Regular canned tuna. Salted nuts. Dairy Processed cheese and cheese spreads. Hard cheeses. Cheese curds. Blue cheese. Feta cheese. String cheese. Regular cottage cheese. Buttermilk. Canned milk. Fats and oils Salted butter. Regular margarine. Ghee. Bacon  fat. Seasonings and condiments Onion salt, garlic salt, seasoned salt, table salt, and sea salt. Canned and packaged gravies. Worcestershire sauce. Tartar sauce. Barbecue sauce. Teriyaki sauce. Soy sauce, including reduced-sodium. Steak sauce. Fish sauce. Oyster sauce. Cocktail sauce. Horseradish that you find on the shelf. Regular ketchup and mustard. Meat flavorings and tenderizers. Bouillon cubes. Hot sauce. Pre-made or packaged marinades. Pre-made or packaged taco seasonings. Relishes. Regular salad dressings. Salsa. Other foods Salted popcorn and pretzels. Corn chips and puffs. Potato and tortilla chips. Canned or dried soups. Pizza. Frozen entrees and pot pies. The items listed above may not be a complete list of foods and beverages you should avoid. Contact a dietitian for more information. Summary Eating less sodium can help lower your blood pressure, reduce swelling, and protect your heart, liver, and kidneys. Most people on this plan should limit their sodium intake to 1,500-2,000 mg (milligrams) of sodium each day. Canned, boxed, and frozen foods are high in sodium. Restaurant foods, fast foods, and pizza are also very high in sodium. You also get sodium by adding salt to food. Try to cook at home, eat more fresh fruits and vegetables, and eat less fast food and canned, processed, or prepared foods. This information is not intended to replace advice given to you by your health care provider. Make sure you discuss any questions you have with your health care provider. Document Revised: 11/15/2019 Document Reviewed: 09/11/2019 Elsevier Patient Education  2022 [place] next appointment is by telephone on 03/14/22 at 1045 am  Please call the care guide team at [phone] if you need to cancel or reschedule your appointment.   If you are experiencing a Mental Health or [place] or need someone to talk to, please call the Suicide and Crisis Lifeline: 988 call  the [place] National Suicide Prevention Lifeline: 818-791-5049 or TTY: 9348474481 TTY (325)728-9211) to talk to a trained counselor call 1-800-273-TALK (toll free, 24 hour hotline) go to [facility] [place] [phone]) call the [place]: [phone] call 911   Following is a copy of your full care plan:  Care Plan : RN Care Manager Plan of Care  Updates made by Kassie Mends, RN since 01/03/2022 12:00 AM     Problem: No plan of care established for management of chronic disease state  (HTN, HLD)   Priority: High     Long-Range Goal: Development of plan of care for chronic disease management  (HTN, HLD)   Start Date: 01/03/2022  Expected End Date: 07/02/2022  Priority: High  Note:   Current Barriers:  Knowledge Deficits related to plan of care for management of HTN and HLD  Patient reports he lives with spouse and is independent with all aspects of his care, continues to drive, CCM pharmacist is involved with patient, reports checks blood pressure 2 x week " readings have been good, usually a little higher in the morning", reports checks blood sugar on occasion with readings in low 100's due to diagnosis of pre diabetes. No advanced directives in place and pt declines information  RNCM Clinical Goal(s):  Patient will verbalize understanding of plan for management of HTN and HLD as evidenced by patient report, review of  EHR and  through collaboration with RN Care manager, provider, and care team.   Interventions: 1:1 collaboration with primary care provider regarding development and update of comprehensive plan of care as evidenced by provider attestation and co-signature Inter-disciplinary care team collaboration (see longitudinal plan of care) Evaluation of current treatment plan related to  self management and patient's adherence to plan as established by provider   Hyperlipidemia:  (Status: New goal.  Goal on Track (progressing): YES.) Long Term Goal  Lab Results  Component Value Date   CHOL 181 01/04/2021   HDL 35 (L) 01/04/2021   LDLCALC 113 (H) 01/04/2021   TRIG 213 (H) 01/04/2021   CHOLHDL 5.2 (H) 01/04/2021     Medication review performed; medication list updated in electronic medical record.  Provider established cholesterol goals reviewed; Counseled on importance of regular laboratory monitoring as prescribed; Reviewed exercise goals and target of 150 minutes per week; Screening for signs and symptoms of depression related to chronic disease state;  Assessed social determinant of health barriers;  Pain assessment completed Provided education on heart healthy diet  Hypertension Interventions:  (Status:  New goal. and Goal on track:  Yes.) Long Term Goal Last practice recorded BP readings:  BP Readings from Last 3 Encounters:  05/11/21 134/78  05/03/21 (!) 135/93  04/30/21 128/84  Most recent eGFR/CrCl: No results found for: EGFR  No components found for: CRCL  Evaluation of current treatment plan related to hypertension self management and patient's adherence to plan as established by provider Reviewed medications with patient and discussed importance of compliance Advised patient, providing education and rationale, to monitor blood pressure daily and record, calling PCP for findings outside established parameters Discussed complications of poorly controlled blood pressure such as heart disease, stroke, circulatory complications, vision complications, kidney impairment, sexual dysfunction  Provided education on low sodium diet  Patient Goals/Self-Care Activities: Take medications as prescribed   Attend all scheduled provider appointments Call pharmacy for medication refills 3-7 days in advance of running out of medications Perform all self care activities independently  Perform IADL's (shopping, preparing meals, housekeeping, managing finances) independently Call  provider office for new concerns or questions  Continue checking blood pressure at least 2 x per week and keeping a log Please take blood pressure log to doctor's visits Continue checking blood sugar as you are doing and keep a log Follow heart healthy diet Look over education sent on after visit summary- low sodium diet and heart healthy diet       Consent to CCM Services: Mr. Raimondo was given information about Chronic Care Management services including:  CCM service includes personalized support from designated clinical staff supervised by his physician, including individualized plan of care and coordination with other care providers 24/7 contact phone numbers for assistance for urgent and routine care needs. Service will only be billed when office clinical staff spend 20 minutes or more in a month to coordinate care. Only one practitioner may furnish and bill the service in a calendar month. The patient may stop CCM services at any time (effective at the end of the month) by phone call to the office staff. The patient will be responsible for cost sharing (co-pay) of up to 20% of the service fee (after annual deductible is met).  Patient agreed to services and verbal consent obtained.   Patient verbalizes understanding of instructions and care plan provided today and agrees to view in [place]. Active MyChart status confirmed with patient.    Telephone follow up appointment  with care management team member scheduled for: 03/14/22

## 2022-01-06 ENCOUNTER — Other Ambulatory Visit: Payer: Self-pay | Admitting: Family Medicine

## 2022-01-17 ENCOUNTER — Other Ambulatory Visit: Payer: Self-pay

## 2022-01-17 ENCOUNTER — Ambulatory Visit (INDEPENDENT_AMBULATORY_CARE_PROVIDER_SITE_OTHER): Payer: Medicare Other | Admitting: Family Medicine

## 2022-01-17 VITALS — BP 146/88 | HR 63 | Temp 97.3°F | Ht 66.0 in | Wt 204.6 lb

## 2022-01-17 DIAGNOSIS — R3916 Straining to void: Secondary | ICD-10-CM | POA: Diagnosis not present

## 2022-01-17 DIAGNOSIS — R7303 Prediabetes: Secondary | ICD-10-CM

## 2022-01-17 DIAGNOSIS — Z125 Encounter for screening for malignant neoplasm of prostate: Secondary | ICD-10-CM | POA: Diagnosis not present

## 2022-01-17 DIAGNOSIS — N401 Enlarged prostate with lower urinary tract symptoms: Secondary | ICD-10-CM | POA: Diagnosis not present

## 2022-01-17 DIAGNOSIS — M2550 Pain in unspecified joint: Secondary | ICD-10-CM

## 2022-01-17 NOTE — Progress Notes (Signed)
? ?Subjective:  ? ? Patient ID: Mike Davila, male    DOB: 28-Aug-1946, 76 y.o.   MRN: [id number] ? ?HPI ?Patient has borderline stage IV chronic kidney disease.  He states that over the last several weeks he has developed diffuse pain all over his body.  He complains of pain in all of his MCP joints.  Particularly bad as his left second MCP joint.  He has some pain in his PIP joints but none in the DIP joints.  He complains of bilateral shoulder pain.  He is unable to sleep on his right shoulder due to the pain.  He states that both of his elbows hurt.  He bends his elbows, it hurts "like a cramp" just to extend his elbows.  He also complains of bilateral knee pain.  He has severe wrist pain.  There is no erythema in any of the involved joints.  There is no palpable effusion.  There is some swelling in his left second MCP joint is visible.  There are no palpable nodules.  There is no obvious osteoarthritic nodules in the PIP or DIP joints.  He denies any fever or chills.  He denies any rashes.  He was seen approximately 1 year ago in the emergency room and there was some concern about vasculitis although the rash went away and has never recurred.  He realizes he cannot take NSAIDs due to his chronic kidney disease and Tylenol is not helping his pain.  He states that all of his joints started hurting simultaneously and it progressively worsened to the point that he cannot tolerate it. ?Past Medical History:  ?Diagnosis Date  ? Anemia   ? Benign localized prostatic hyperplasia with lower urinary tract symptoms (LUTS)   ? CKD (chronic kidney disease), stage III ([place])   ? nephrologist-  dr Hinda Lenis ([facility] in [place])-- per pt last visit 06/ 2019  ? Diverticulosis of colon   ? ED (erectile dysfunction)   ? History of acute gouty arthritis 10/23/2017  ? History of acute renal failure 10/23/2017  ? hospital admission-- w/ acute tubular necrosis superimposed on ckd 3--- resolved  ? History of diverticulitis of colon  10/05/2017  ? admission-- mild-- resolved w/ medical management  ? History of metabolic acidosis 31/51/7616  ? admission-- resolved  ? History of palpitations   ? approx. 2009, per pt no issue since  ? History of urinary retention 10/2017  ? Hypertension   ? Incomplete right bundle branch block   ? Mixed hyperlipidemia   ? Nephrolithiasis   ? left side nonobstructive  ? Pre-diabetes   ? Right ureteral stone   ? Wears dentures   ? upper  ? ? ?Past Surgical History:  ?Procedure Laterality Date  ? ANKLE SURGERY Bilateral 1990s to early 2000s  ? removal bone spurs  ? APPENDECTOMY  child  ? COLONOSCOPY N/A 10/27/2017  ? Procedure: COLONOSCOPY;  Surgeon: Rogene Houston, MD;  Location: [facility];  Service: Endoscopy;  Laterality: N/A;  ? CYSTOSCOPY/URETEROSCOPY/HOLMIUM LASER/STENT PLACEMENT Right 05/09/2018  ? Procedure: CYSTOSCOPY RIGHT URETEROSCOPY/HOLMIUM LASER/STENT PLACEMENT;  Surgeon: Ardis Hughs, MD;  Location: [facility];  Service: Urology;  Laterality: Right;  ? HEMATOMA EVACUATION  1990s  ? right calf  ? INGUINAL HERNIA REPAIR Bilateral age 48s  ? PLANTAR FASCIA SURGERY Left 2004  ? TONSILLECTOMY  child  ? TRANSTHORACIC ECHOCARDIOGRAM  10/24/2017  ? ef 60-65%,  grade 1 diastolic dysfunction  ? ?Current Outpatient Medications on File Prior  to Visit  ?Medication Sig Dispense Refill  ? Accu-Chek Softclix Lancets lancets Check bs bid DX: R73.03 100 each 12  ? albuterol (VENTOLIN HFA) 108 (90 Base) MCG/ACT inhaler Inhale 2 puffs into the lungs 3 (three) times daily. 8 g 1  ? alfuzosin (UROXATRAL) 10 MG 24 hr tablet TAKE 1 TABLET BY MOUTH ONCE A DAY. 90 tablet 0  ? ascorbic acid (VITAMIN C) 500 MG tablet Take 1 tablet (500 mg total) by mouth daily. 30 tablet 1  ? Blood Glucose Monitoring Suppl (ACCU-CHEK GUIDE) w/Device KIT 1 Units by Does not apply route in the morning and at bedtime. 1 kit 0  ? furosemide (LASIX) 40 MG tablet TAKE (1) TABLET BY MOUTH TWICE DAILY. 180 tablet 0  ? glucose  blood (ACCU-CHEK GUIDE) test strip Check bs bid DX: R73.03 100 each 3  ? Lancets Misc. (ACCU-CHEK SOFTCLIX LANCET DEV) KIT Check bs bid DX: R73.03 1 kit 0  ? metoprolol succinate (TOPROL-XL) 25 MG 24 hr tablet TAKE ONE TABLET BY MOUTH DAILY 90 tablet 2  ? Multiple Vitamin (MULTIVITAMIN WITH MINERALS) TABS tablet Take 1 tablet by mouth daily. 30 tablet 1  ? potassium chloride SA (KLOR-CON) 20 MEQ tablet Take 1 tablet (20 mEq total) by mouth daily. 90 tablet 3  ? predniSONE (DELTASONE) 20 MG tablet 3 tabs poqday 1-2, 2 tabs poqday 3-4, 1 tab poqday 5-6 12 tablet 0  ? valsartan (DIOVAN) 160 MG tablet TAKE ONE TABLET BY MOUTH ONCE DAILY. 90 tablet 1  ? valsartan (DIOVAN) 80 MG tablet Take 1 tablet (80 mg total) by mouth at bedtime as needed for up to 30 doses. Take if systolic blood pressure (top number) is above 150 mmhg in the evening. 30 tablet 0  ? OXYGEN Inhale 3 L into the lungs. Continous (Patient not taking: Reported on 01/03/2022)    ? ?No current facility-administered medications on file prior to visit.  ? ?Allergies  ?Allergen Reactions  ? Sulfa Antibiotics Hives and Shortness Of Breath  ? Crestor [Rosuvastatin Calcium] Other (See Comments)  ?  Leg cramps  ? Lipitor [Atorvastatin Calcium] Other (See Comments)  ?  Leg cramps  ? Tetanus Toxoids Other (See Comments)  ?  Unknown reaction  ? ?Social History  ? ?Socioeconomic History  ? Marital status: Single  ?  Spouse name: Not on file  ? Number of children: Not on file  ? Years of education: Not on file  ? Highest education level: Not on file  ?Occupational History  ? Not on file  ?Tobacco Use  ? Smoking status: Former  ?  Years: 20.00  ?  Types: Cigarettes  ?  Quit date: 05/04/2006  ?  Years since quitting: 15.7  ? Smokeless tobacco: Former  ?  Types: Chew  ?  Quit date: 05/04/2017  ?Vaping Use  ? Vaping Use: Never used  ?Substance and Sexual Activity  ? Alcohol use: No  ? Drug use: No  ? Sexual activity: Yes  ?Other Topics Concern  ? Not on file  ?Social History  Narrative  ? Married.  ? 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  ? ?Social Determinants of Health  ? ?Financial Resource Strain: Low Risk   ? Difficulty of Paying Living Expenses: Not hard at all  ?Food Insecurity: No Food Insecurity  ? Worried About Charity fundraiser in the Last Year: Never true  ? Ran Out of Food in the Last Year: Never true  ?Transportation Needs: No Transportation Needs  ? Lack of Transportation (  Medical): No  ? Lack of Transportation (Non-Medical): No  ?Physical Activity: Sufficiently Active  ? Days of Exercise per Week: 5 days  ? Minutes of Exercise per Session: 30 min  ?Stress: No Stress Concern Present  ? Feeling of Stress : Not at all  ?Social Connections: Unknown  ? Frequency of Communication with Friends and Family: More than three times a week  ? Frequency of Social Gatherings with Friends and Family: More than three times a week  ? Attends Religious Services: Not on file  ? Active Member of Clubs or Organizations: Yes  ? Attends Archivist Meetings: More than 4 times per year  ? Marital Status: Married  ?Intimate Partner Violence: Not At Risk  ? Fear of Current or Ex-Partner: No  ? Emotionally Abused: No  ? Physically Abused: No  ? Sexually Abused: No  ? ?Family History  ?Problem Relation Age of Onset  ? Thyroid cancer Mother   ? Heart disease Father   ?     MI  ? Colon cancer Father   ? Melanoma Father   ? Benign prostatic hyperplasia Brother   ? Diabetes Mellitus II Maternal Grandmother   ? Ovarian cancer Daughter   ? ? ?Review of Systems ? ?   ?Objective:  ? Physical Exam ?Vitals reviewed.  ?Constitutional:   ?   General: He is not in acute distress. ?   Appearance: Normal appearance. He is normal weight. He is not ill-appearing or toxic-appearing.  ?Cardiovascular:  ?   Rate and Rhythm: Normal rate and regular rhythm.  ?   Heart sounds: Normal heart sounds. No murmur heard. ?  No gallop.  ?Pulmonary:  ?   Effort: Pulmonary effort is normal. No respiratory distress.  ?   Breath sounds: Normal  breath sounds. No stridor. No wheezing, rhonchi or rales.  ?Musculoskeletal:  ?   Right shoulder: Crepitus present. Decreased range of motion.  ?   Left shoulder: Normal range of motion.  ?   Right elbow:

## 2022-01-18 ENCOUNTER — Other Ambulatory Visit: Payer: Self-pay | Admitting: Family Medicine

## 2022-01-18 DIAGNOSIS — M059 Rheumatoid arthritis with rheumatoid factor, unspecified: Secondary | ICD-10-CM

## 2022-01-18 LAB — ANA: Anti Nuclear Antibody (ANA): NEGATIVE

## 2022-01-18 LAB — BASIC METABOLIC PANEL WITH GFR
BUN/Creatinine Ratio: 13 (calc) (ref 6–22)
BUN: 24 mg/dL (ref 7–25)
CO2: 25 mmol/L (ref 20–32)
Calcium: 9.4 mg/dL (ref 8.6–10.3)
Chloride: 102 mmol/L (ref 98–110)
Creat: 1.84 mg/dL — ABNORMAL HIGH (ref 0.70–1.28)
Glucose, Bld: 82 mg/dL (ref 65–99)
Potassium: 4.1 mmol/L (ref 3.5–5.3)
Sodium: 137 mmol/L (ref 135–146)
eGFR: 38 mL/min/{1.73_m2} — ABNORMAL LOW (ref >=60)

## 2022-01-18 LAB — PSA: PSA: 3.37 ng/mL (ref <=4.00)

## 2022-01-18 LAB — RHEUMATOID FACTOR: Rheumatoid fact SerPl-aCnc: 301 IU/mL — ABNORMAL HIGH (ref <14)

## 2022-01-18 LAB — HEMOGLOBIN A1C
Hgb A1c MFr Bld: 5.5 % of total Hgb (ref <5.7)
Mean Plasma Glucose: 111 mg/dL
eAG (mmol/L): 6.2 mmol/L

## 2022-01-18 LAB — SEDIMENTATION RATE: Sed Rate: 9 mm/h (ref 0–20)

## 2022-01-21 DIAGNOSIS — E785 Hyperlipidemia, unspecified: Secondary | ICD-10-CM | POA: Diagnosis not present

## 2022-01-21 DIAGNOSIS — I10 Essential (primary) hypertension: Secondary | ICD-10-CM

## 2022-01-26 ENCOUNTER — Telehealth: Payer: Self-pay | Admitting: Family Medicine

## 2022-01-26 NOTE — Telephone Encounter (Signed)
Patient called to follow up on referral received to see rheumatologist. First available appointment isn't until the end of August; patient requesting to be referred elsewhere for a sooner appointment (sx progressing rapidly). ? ?Please advise at [phone].  ?

## 2022-01-27 ENCOUNTER — Encounter: Payer: Self-pay | Admitting: Family Medicine

## 2022-01-31 NOTE — Telephone Encounter (Signed)
Route msg to [employer] for assistance ?

## 2022-01-31 NOTE — Telephone Encounter (Signed)
Hi, there Mike Davila ? ?I am unable to change the referrals however is there any way you can change this referral to mark "urgent". This Pt needs to be seen sooner, please. Thank you.  ?

## 2022-02-01 ENCOUNTER — Telehealth: Payer: Self-pay | Admitting: Pharmacist

## 2022-02-01 NOTE — Progress Notes (Signed)
? ? ?Chronic Care Management ?Pharmacy Assistant  ? ?Name: Mike Davila  MRN: [id number] DOB: 11-Sep-1946 ? ? ?Reason for Encounter: Disease State - General Adherence Call  ? ? ?Recent office visits:  ?01/17/22 Jenna Luo, [place] were ordered.  Try Voltaren gel over-the-counter 3-4 times a day for pain. Follow up If no improvement.  ? ?08/13/21 Annual Medicare Wellness Completed.  ? ?Recent consult visits:  ?08/25/21 Allyn Kenner - Dermatology - Inflammed Seborrheic keratosis - No notes available.  ? ?Hospital visits:  ?None in previous 6 months ? ?Medications: ?Outpatient Encounter Medications as of 02/01/2022  ?Medication Sig  ? Accu-Chek Softclix Lancets lancets Check bs bid DX: R73.03  ? albuterol (VENTOLIN HFA) 108 (90 Base) MCG/ACT inhaler Inhale 2 puffs into the lungs 3 (three) times daily.  ? alfuzosin (UROXATRAL) 10 MG 24 hr tablet TAKE 1 TABLET BY MOUTH ONCE A DAY.  ? ascorbic acid (VITAMIN C) 500 MG tablet Take 1 tablet (500 mg total) by mouth daily.  ? Blood Glucose Monitoring Suppl (ACCU-CHEK GUIDE) w/Device KIT 1 Units by Does not apply route in the morning and at bedtime.  ? furosemide (LASIX) 40 MG tablet TAKE (1) TABLET BY MOUTH TWICE DAILY.  ? glucose blood (ACCU-CHEK GUIDE) test strip Check bs bid DX: R73.03  ? Lancets Misc. (ACCU-CHEK SOFTCLIX LANCET DEV) KIT Check bs bid DX: R73.03  ? metoprolol succinate (TOPROL-XL) 25 MG 24 hr tablet TAKE ONE TABLET BY MOUTH DAILY  ? Multiple Vitamin (MULTIVITAMIN WITH MINERALS) TABS tablet Take 1 tablet by mouth daily.  ? OXYGEN Inhale 3 L into the lungs. Continous (Patient not taking: Reported on 01/03/2022)  ? potassium chloride SA (KLOR-CON) 20 MEQ tablet Take 1 tablet (20 mEq total) by mouth daily.  ? predniSONE (DELTASONE) 20 MG tablet 3 tabs poqday 1-2, 2 tabs poqday 3-4, 1 tab poqday 5-6  ? valsartan (DIOVAN) 160 MG tablet TAKE ONE TABLET BY MOUTH ONCE DAILY.  ? valsartan (DIOVAN) 80 MG tablet Take 1 tablet (80 mg  total) by mouth at bedtime as needed for up to 30 doses. Take if systolic blood pressure (top number) is above 150 mmhg in the evening.  ? ?No facility-administered encounter medications on file as of 02/01/2022.  ? ? ?Have you had any problems recently with your health? ?Patient denied any recent issues or concerns with his health. He reported he is doing well and his arthritis is doing better.  ? ?Have you had any problems with your pharmacy? ?Patient denied any problems with his current pharmacy.  ? ?What issues or side effects are you having with your medications? ?Patient denied any issues or side effects with his current medications.  ? ?What would you like me to pass along to Leata Mouse, CPP for them to help you with?  ?Patient did not have anything to pass along to CPP other than he is currently doing well with his arthritis and just got his prednisone picked up this week.  ? ?What can we do to take care of you better? ?Patient did not have any recommendations at this time.  ? ?Care Gaps ? ?AWV: done 08/13/21 ?Colonoscopy: done 10/27/17 ?DM Eye Exam: N/A ?DM Foot Exam: N/A ?Microalbumin: done 01/04/21 ?HbgAIC:  done 01/17/22 (6.3) ?DEXA:  N/A ?Mammogram: N/A ? ?Star Rating Drugs ?Valsartan (DIOVAN) 160 MG tablet - last filled 11/25/21 90 days  ? ? ?Future Appointments  ?Date Time Provider [place]  ?03/14/2022 10:45 AM [facility]  CARE MGR [facility]  ?08/18/2022  2:45 PM [facility]-NURSE HEALTH ADVISOR [facility]  ? ? ? ?Liza Showfety, CCMA ?Clinical Pharmacist Assistant  ?[phone] ? ? ?

## 2022-02-14 DIAGNOSIS — M79672 Pain in left foot: Secondary | ICD-10-CM | POA: Diagnosis not present

## 2022-02-14 DIAGNOSIS — Z1159 Encounter for screening for other viral diseases: Secondary | ICD-10-CM | POA: Diagnosis not present

## 2022-02-14 DIAGNOSIS — M79641 Pain in right hand: Secondary | ICD-10-CM | POA: Diagnosis not present

## 2022-02-14 DIAGNOSIS — Z8739 Personal history of other diseases of the musculoskeletal system and connective tissue: Secondary | ICD-10-CM | POA: Diagnosis not present

## 2022-02-14 DIAGNOSIS — E782 Mixed hyperlipidemia: Secondary | ICD-10-CM | POA: Diagnosis not present

## 2022-02-14 DIAGNOSIS — M79671 Pain in right foot: Secondary | ICD-10-CM | POA: Diagnosis not present

## 2022-02-14 DIAGNOSIS — M064 Inflammatory polyarthropathy: Secondary | ICD-10-CM | POA: Diagnosis not present

## 2022-02-14 DIAGNOSIS — R768 Other specified abnormal immunological findings in serum: Secondary | ICD-10-CM | POA: Diagnosis not present

## 2022-02-14 DIAGNOSIS — N189 Chronic kidney disease, unspecified: Secondary | ICD-10-CM | POA: Diagnosis not present

## 2022-02-14 DIAGNOSIS — M79642 Pain in left hand: Secondary | ICD-10-CM | POA: Diagnosis not present

## 2022-02-14 DIAGNOSIS — I10 Essential (primary) hypertension: Secondary | ICD-10-CM | POA: Diagnosis not present

## 2022-02-15 ENCOUNTER — Telehealth: Payer: Self-pay | Admitting: Pharmacist

## 2022-02-17 NOTE — Telephone Encounter (Signed)
Error duplicate / ? ?Liza Showfety, CCMA ?Clinical Pharmacist Assistant  ?[phone] ?  ?

## 2022-03-04 IMAGING — CT CT HEAD W/O CM
3 series · 16 of 47 positions shown, 19 images · non-contrast
Comparison: None.

CLINICAL DATA: Left-sided headaches

EXAM:
CT HEAD WITHOUT CONTRAST
TECHNIQUE: Contiguous axial images were obtained from the base of the skull
through the vertex without intravenous contrast.

[Series 2: head w o · axial · 0.45mm/px · z∈[+1736,+1886]mm · 10 of 36 slices shown, 13 images]
[im 3/36  brain]
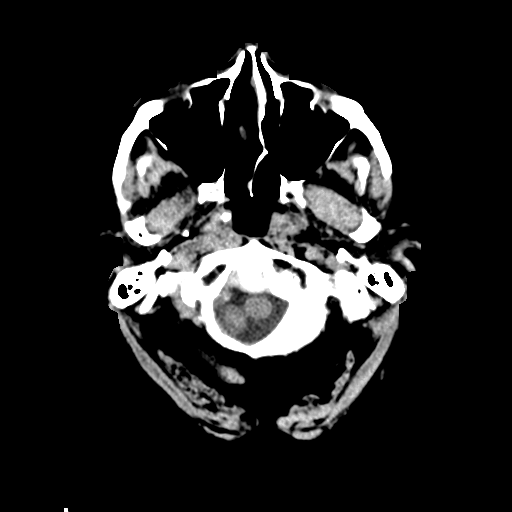
[im 3/36  bone]
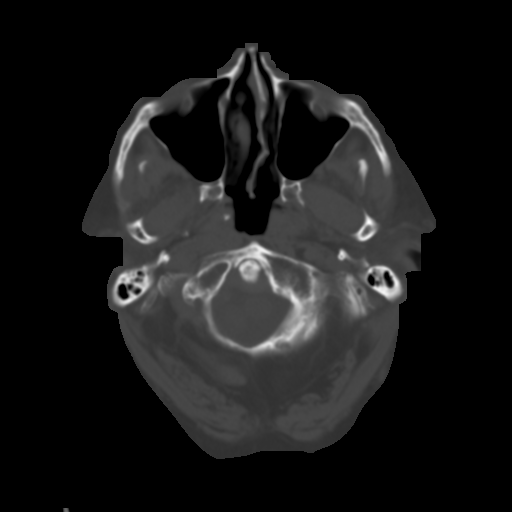
[im 7/36  brain]
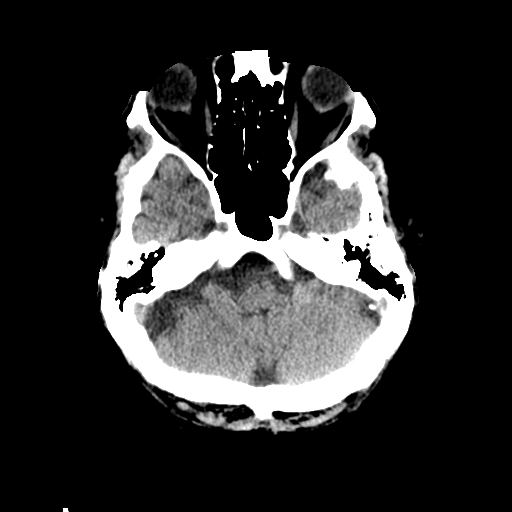
[im 10/36  brain]
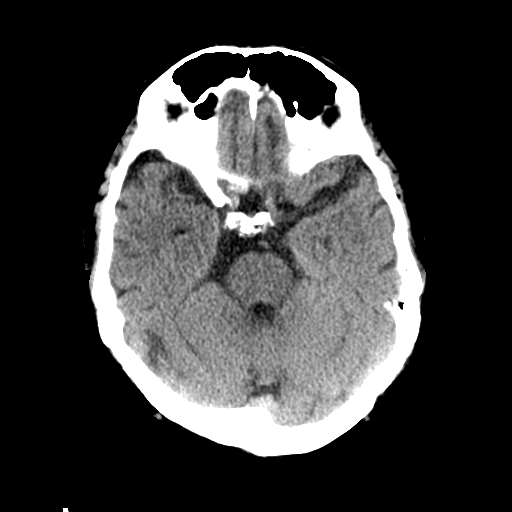
[im 13/36  brain]
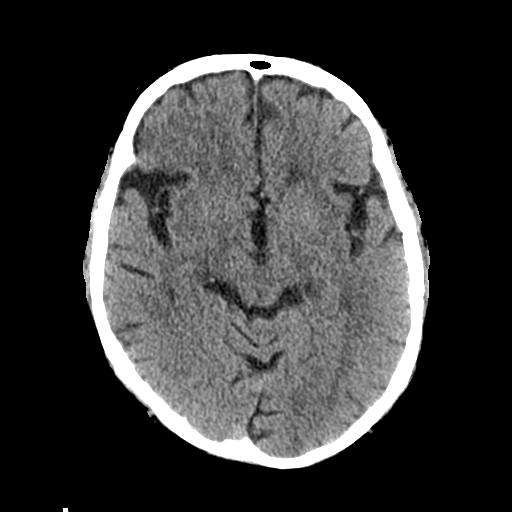
[im 16/36  brain]
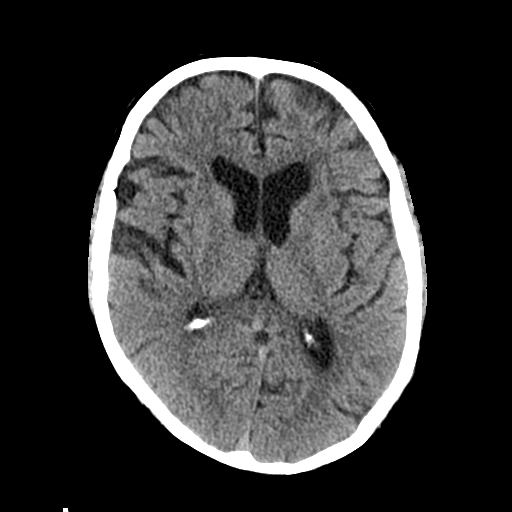
[im 16/36  bone]
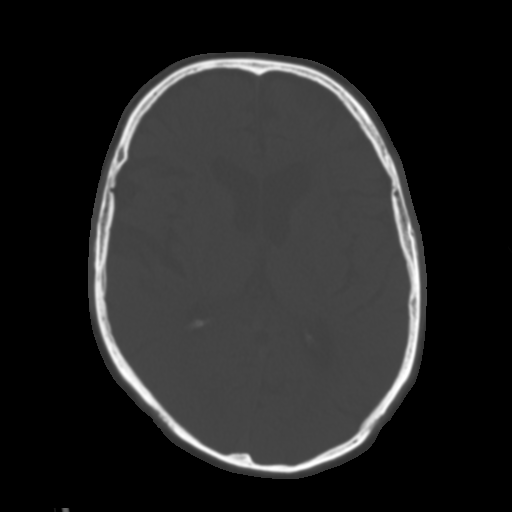
[im 20/36  brain]
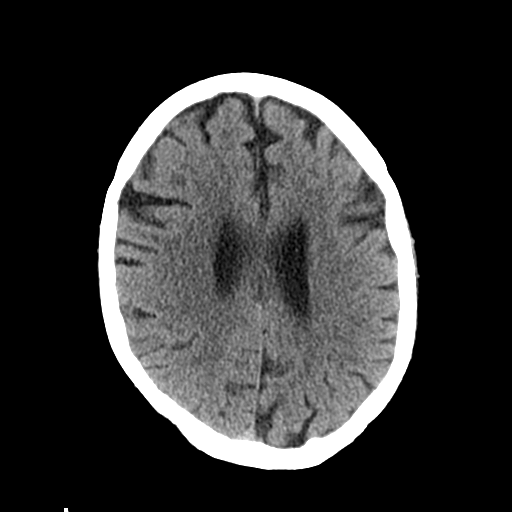
[im 23/36  brain]
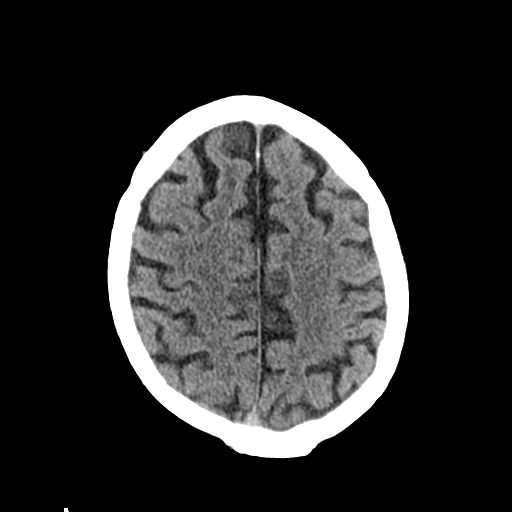
[im 27/36  brain]
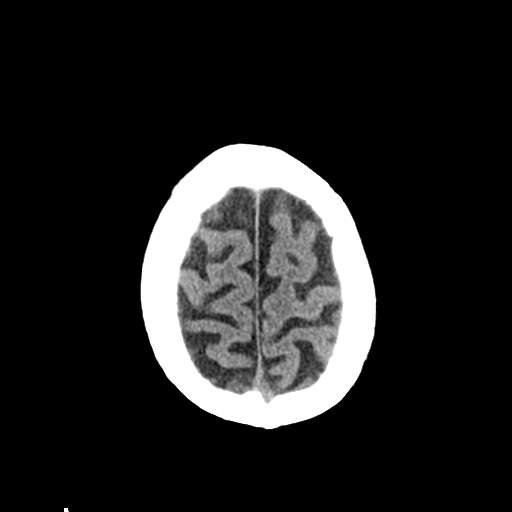
[im 29/36  brain]
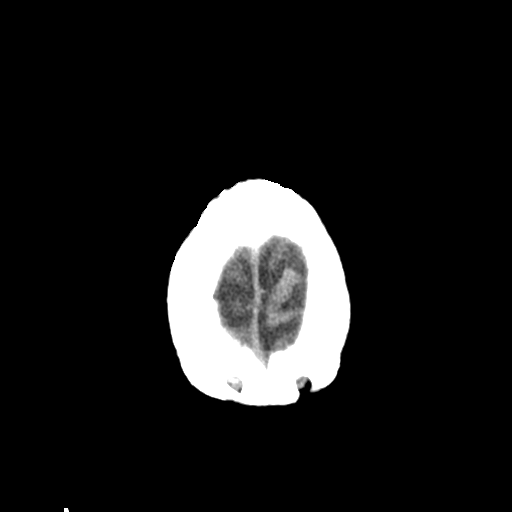
[im 29/36  bone]
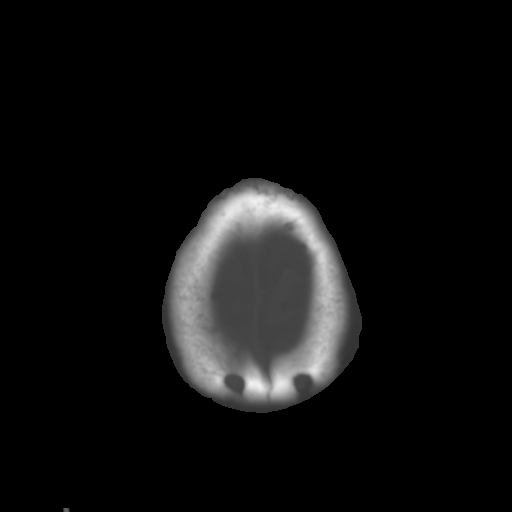
[im 33/36  brain]
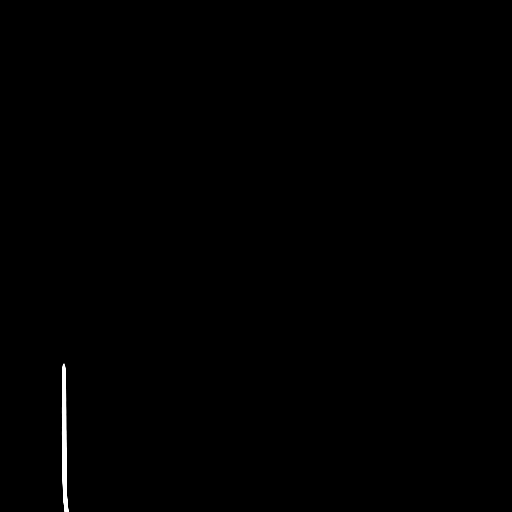

[Series 4: coronal soft · coronal · 0.35mm/px · 3 of 75 slices shown]
[im 25/75  brain]
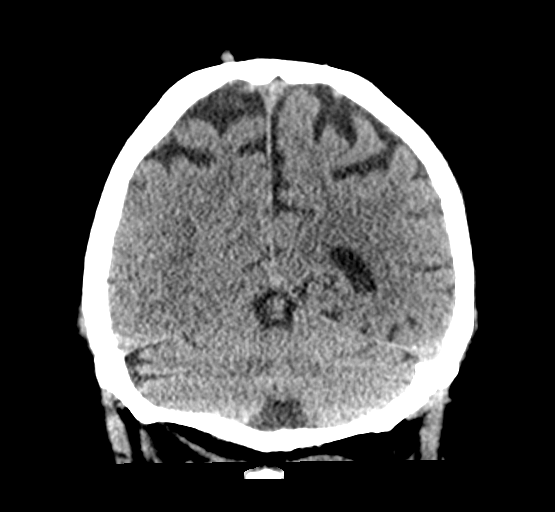
[im 33/75  brain]
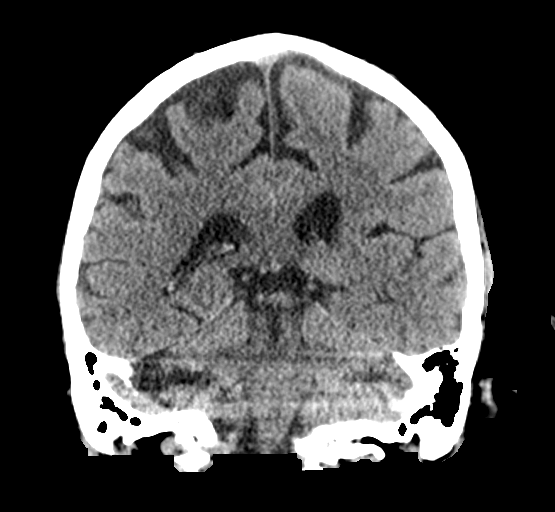
[im 42/75  brain]
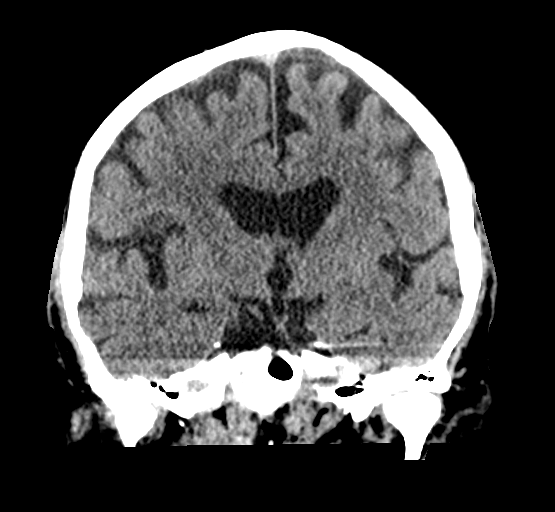

[Series 5: sagittal soft · sagittal · 0.35mm/px · 3 of 64 slices shown]
[im 22/64  brain]
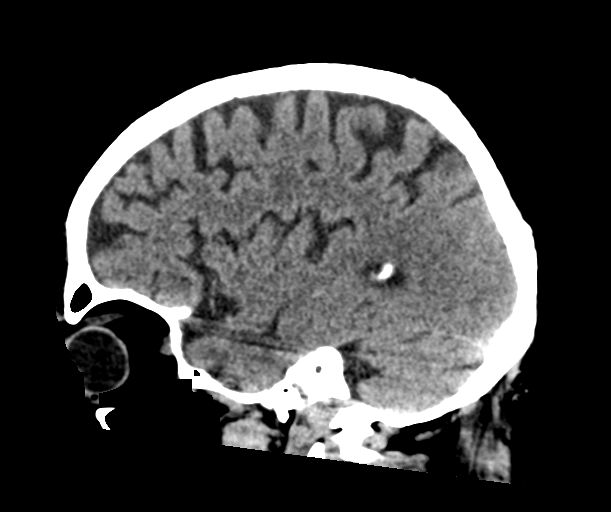
[im 32/64  brain]
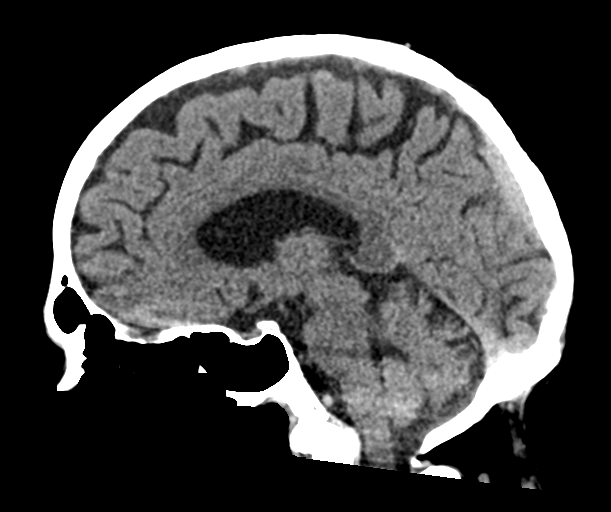
[im 43/64  brain]
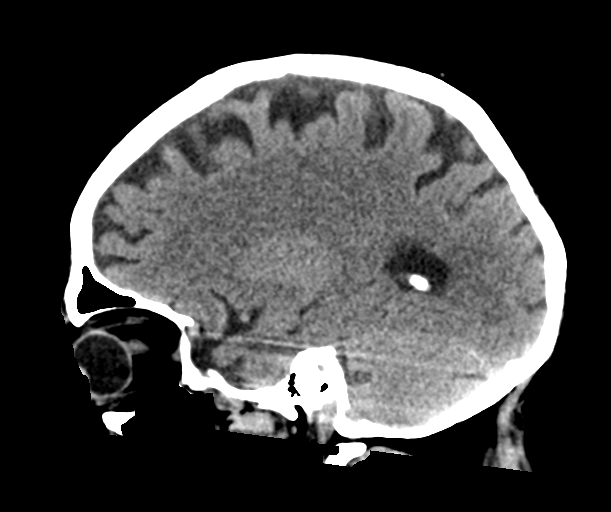

[16 of 47 positions shown; findings below may reference images not displayed]

FINDINGS: Brain: No evidence of acute infarction, hemorrhage, hydrocephalus,
extra-axial collection or mass lesion/mass effect. Mild atrophic
changes are noted.

Vascular: No hyperdense vessel or unexpected calcification.

Skull: Burr holes are noted posteriorly.

Sinuses/Orbits: No acute finding.

Other: None
IMPRESSION: Mild atrophic changes without acute abnormality.

## 2022-03-08 DIAGNOSIS — M05849 Other rheumatoid arthritis with rheumatoid factor of unspecified hand: Secondary | ICD-10-CM | POA: Diagnosis not present

## 2022-03-08 DIAGNOSIS — N189 Chronic kidney disease, unspecified: Secondary | ICD-10-CM | POA: Diagnosis not present

## 2022-03-08 DIAGNOSIS — Z79899 Other long term (current) drug therapy: Secondary | ICD-10-CM | POA: Diagnosis not present

## 2022-03-08 DIAGNOSIS — I10 Essential (primary) hypertension: Secondary | ICD-10-CM | POA: Diagnosis not present

## 2022-03-08 DIAGNOSIS — E782 Mixed hyperlipidemia: Secondary | ICD-10-CM | POA: Diagnosis not present

## 2022-03-08 DIAGNOSIS — M0579 Rheumatoid arthritis with rheumatoid factor of multiple sites without organ or systems involvement: Secondary | ICD-10-CM | POA: Diagnosis not present

## 2022-03-08 DIAGNOSIS — Z8739 Personal history of other diseases of the musculoskeletal system and connective tissue: Secondary | ICD-10-CM | POA: Diagnosis not present

## 2022-03-14 ENCOUNTER — Telehealth: Payer: Medicare Other

## 2022-04-06 DIAGNOSIS — Z8739 Personal history of other diseases of the musculoskeletal system and connective tissue: Secondary | ICD-10-CM | POA: Diagnosis not present

## 2022-04-06 DIAGNOSIS — M0579 Rheumatoid arthritis with rheumatoid factor of multiple sites without organ or systems involvement: Secondary | ICD-10-CM | POA: Diagnosis not present

## 2022-04-06 DIAGNOSIS — E782 Mixed hyperlipidemia: Secondary | ICD-10-CM | POA: Diagnosis not present

## 2022-04-06 DIAGNOSIS — Z79899 Other long term (current) drug therapy: Secondary | ICD-10-CM | POA: Diagnosis not present

## 2022-04-06 DIAGNOSIS — N189 Chronic kidney disease, unspecified: Secondary | ICD-10-CM | POA: Diagnosis not present

## 2022-04-06 DIAGNOSIS — I10 Essential (primary) hypertension: Secondary | ICD-10-CM | POA: Diagnosis not present

## 2022-04-10 ENCOUNTER — Other Ambulatory Visit: Payer: Self-pay | Admitting: Family Medicine

## 2022-04-11 NOTE — Telephone Encounter (Signed)
Requested Prescriptions  Pending Prescriptions Disp Refills  . alfuzosin (UROXATRAL) 10 MG 24 hr tablet [Pharmacy Med Name: ALFUZOSIN HCL ER 10 MG TABLET] 90 tablet 0    Sig: TAKE 1 TABLET BY MOUTH ONCE A DAY.     Urology: Alpha-Adrenergic Blocker 2 Failed - 04/11/2022  1:08 PM      Failed - Cr in normal range and within 360 days    Creat  Date Value Ref Range Status  01/17/2022 1.84 (H) 0.70 - 1.28 mg/dL Final   Creatinine, Urine  Date Value Ref Range Status  07/14/2020 107 20 - 320 mg/dL Final         Failed - ALT in normal range and within 360 days    ALT  Date Value Ref Range Status  04/13/2021 25 9 - 46 U/L Final         Failed - AST in normal range and within 360 days    AST  Date Value Ref Range Status  04/13/2021 30 10 - 35 U/L Final         Failed - Last BP in normal range    BP Readings from Last 1 Encounters:  01/17/22 (!) 146/88         Passed - PSA in normal range and within 360 days    PSA  Date Value Ref Range Status  01/17/2022 3.37 < OR = 4.00 ng/mL Final    Comment:    The total PSA value from this assay system is  standardized against the WHO standard. The test  result will be approximately 20% lower when compared  to the equimolar-standardized total PSA (Beckman  Coulter). Comparison of serial PSA results should be  interpreted with this fact in mind. . This test was performed using the Siemens  chemiluminescent method. Values obtained from  different assay methods cannot be used interchangeably. PSA levels, regardless of value, should not be interpreted as absolute evidence of the presence or absence of disease.          Passed - Valid encounter within last 12 months    Recent Outpatient Visits          2 months ago [place], Cammie Mcgee, MD   11 months ago Vasculitis [facility])   [place] Pickard, Cammie Mcgee, MD   11 months ago Primary hypertension   [place]  [place] [facility], Cammie Mcgee, MD   12 months ago Muscle weakness (generalized)   [place] Pickard, Cammie Mcgee, MD   1 year ago Prediabetes   [place] Pickard, Cammie Mcgee, MD

## 2022-04-20 ENCOUNTER — Emergency Department (HOSPITAL_COMMUNITY)
Admission: EM | Admit: 2022-04-20 | Discharge: 2022-04-20 | Disposition: A | Discharge status: Home / Self Care | Payer: Medicare Other | Attending: Emergency Medicine | Admitting: Emergency Medicine

## 2022-04-20 ENCOUNTER — Encounter (HOSPITAL_COMMUNITY): Payer: Self-pay | Admitting: Emergency Medicine

## 2022-04-20 ENCOUNTER — Other Ambulatory Visit: Payer: Self-pay

## 2022-04-20 DIAGNOSIS — I10 Essential (primary) hypertension: Secondary | ICD-10-CM | POA: Insufficient documentation

## 2022-04-20 DIAGNOSIS — Z79899 Other long term (current) drug therapy: Secondary | ICD-10-CM | POA: Insufficient documentation

## 2022-04-20 DIAGNOSIS — J45909 Unspecified asthma, uncomplicated: Secondary | ICD-10-CM | POA: Diagnosis not present

## 2022-04-20 HISTORY — DX: Rheumatoid arthritis, unspecified: M06.9

## 2022-04-20 MED ORDER — HYDROCHLOROTHIAZIDE 12.5 MG PO TABS: 12.5000 mg | ORAL_TABLET | Freq: Every day | ORAL | 1 refills | Status: DC

## 2022-04-20 MED ORDER — HYDROCHLOROTHIAZIDE 25 MG PO TABS
25.0000 mg | ORAL_TABLET | Freq: Every day | ORAL | Status: DC
  Administered 2022-04-20: 25 mg via ORAL
  Filled 2022-04-20: qty 1

## 2022-04-20 NOTE — Discharge Instructions (Signed)
Measure your blood pressure once daily 2 hours after taking your medicine  Add the medicine called HCTZ once daily - until you see your doctor  Thank you for allowing [place] to treat you in the emergency department today.  After reviewing your examination and potential testing that was done it appears that you are safe to go home.  I would like for you to follow-up with your doctor within the next several days, have them obtain your results and follow-up with them to review all of these tests.  If you should develop severe or worsening symptoms return to the emergency department immediately

## 2022-04-20 NOTE — ED Provider Notes (Signed)
[facility] EMERGENCY DEPARTMENT Provider Note   CSN: [id number] Arrival date & time: 04/20/22  1332     History  Chief Complaint  Patient presents with   Hypertension    Mike Davila is a 76 y.o. male.   Hypertension   This patient is a 76 year old male with a history of hypertension, he takes metoprolol 25 mg XL once a day and is on valsartan 160 mg once a day.  He takes no other daily blood pressure medicines, he takes his blood pressure 3 times a day including first thing in the morning when he wakes up and it is usually 150/90, it usually gets a little bit better throughout the day but today he noticed it was about 8 points worse closer to 160.  This made him concerned so he came to the emergency department despite not having any symptoms whatsoever.  He has not missed any of his doses and he has no chest pain shortness of breath headache visual changes nausea vomiting or diarrhea, he has no coughing or, no leg swelling, no numbness or weakness    Home Medications Prior to Admission medications   Medication Sig Start Date End Date Taking? Authorizing Provider  hydrochlorothiazide (HYDRODIURIL) 12.5 MG tablet Take 1 tablet (12.5 mg total) by mouth daily. 04/20/22  Yes Noemi Chapel, MD  Accu-Chek Softclix Lancets lancets Check bs bid DX: R73.03 02/10/20   [name], MD  albuterol (VENTOLIN HFA) 108 (90 Base) MCG/ACT inhaler Inhale 2 puffs into the lungs 3 (three) times daily. 01/14/20   Barton Dubois, MD  alfuzosin (UROXATRAL) 10 MG 24 hr tablet TAKE 1 TABLET BY MOUTH ONCE A DAY. 04/11/22   [name], MD  ascorbic acid (VITAMIN C) 500 MG tablet Take 1 tablet (500 mg total) by mouth daily. 01/15/20   Barton Dubois, MD  Blood Glucose Monitoring Suppl (ACCU-CHEK GUIDE) w/Device KIT 1 Units by Does not apply route in the morning and at bedtime. 02/10/20   [name], MD  furosemide (LASIX) 40 MG tablet TAKE (1) TABLET BY MOUTH TWICE DAILY. 12/30/21   [name], MD  glucose blood (ACCU-CHEK GUIDE) test strip Check bs bid DX: R73.03 02/10/20   [name], MD  Lancets Misc. (ACCU-CHEK SOFTCLIX LANCET DEV) KIT Check bs bid DX: R73.03 02/10/20   [name], MD  metoprolol succinate (TOPROL-XL) 25 MG 24 hr tablet TAKE ONE TABLET BY MOUTH DAILY 09/08/21   [name], MD  Multiple Vitamin (MULTIVITAMIN WITH MINERALS) TABS tablet Take 1 tablet by mouth daily. 01/15/20   Barton Dubois, MD  OXYGEN Inhale 3 L into the lungs. Continous Patient not taking: Reported on 01/03/2022    [provider]  potassium chloride SA (KLOR-CON) 20 MEQ tablet Take 1 tablet (20 mEq total) by mouth daily. 03/04/20   [name], MD  predniSONE (DELTASONE) 20 MG tablet 3 tabs poqday 1-2, 2 tabs poqday 3-4, 1 tab poqday 5-6 05/11/21   [name], MD  valsartan (DIOVAN) 160 MG tablet TAKE ONE TABLET BY MOUTH ONCE DAILY. 11/25/21   [name], MD  valsartan (DIOVAN) 80 MG tablet Take 1 tablet (80 mg total) by mouth at bedtime as needed for up to 30 doses. Take if systolic blood pressure (top number) is above 150 mmhg in the evening. 04/21/21   Trifan, Carola Rhine, MD      Allergies    Sulfa antibiotics, Crestor [rosuvastatin calcium], Lipitor [atorvastatin calcium], and  Tetanus toxoids    Review of Systems   Review of Systems  All other systems reviewed and are negative.   Physical Exam Updated Vital Signs BP (!) 168/115   Pulse (!) 58   Temp 98.1 F (36.7 C) (Oral)   Resp 16   Ht 1.676 m ($Remove'5\' 6"'APRZhOe$ )   Wt 93.4 kg   SpO2 96%   BMI 33.25 kg/m  Physical Exam Vitals and nursing note reviewed.  Constitutional:      General: He is not in acute distress.    Appearance: He is well-developed.  HENT:     Head: Normocephalic and atraumatic.     Mouth/Throat:     Pharynx: No oropharyngeal exudate.  Eyes:     General: No scleral icterus.       Right eye: No discharge.        Left eye: No discharge.     Conjunctiva/sclera:  Conjunctivae normal.     Pupils: Pupils are equal, round, and reactive to light.  Neck:     Thyroid: No thyromegaly.     Vascular: No JVD.  Cardiovascular:     Rate and Rhythm: Normal rate and regular rhythm.     Heart sounds: Normal heart sounds. No murmur heard.    No friction rub. No gallop.  Pulmonary:     Effort: Pulmonary effort is normal. No respiratory distress.     Breath sounds: Normal breath sounds. No wheezing or rales.  Abdominal:     General: Bowel sounds are normal. There is no distension.     Palpations: Abdomen is soft. There is no mass.     Tenderness: There is no abdominal tenderness.  Musculoskeletal:        General: No tenderness. Normal range of motion.     Cervical back: Normal range of motion and neck supple.     Right lower leg: No edema.     Left lower leg: No edema.  Lymphadenopathy:     Cervical: No cervical adenopathy.  Skin:    General: Skin is warm and dry.     Findings: No erythema or rash.  Neurological:     Mental Status: He is alert.     Coordination: Coordination normal.     Comments: Speech is clear, cranial nerves III through XII are intact, memory is intact, strength is normal in all 4 extremities including grips, sensation is intact to light touch and pinprick in all 4 extremities. Coordination as tested by finger-nose-finger is normal, no limb ataxia. Normal gait, normal reflexes at the patellar tendons bilaterally  Psychiatric:        Behavior: Behavior normal.     ED Results / Procedures / Treatments   Labs (all labs ordered are listed, but only abnormal results are displayed) Labs Reviewed - No data to display  EKG EKG Interpretation  Date/Time:  Wednesday April 20 2022 14:06:30 EDT Ventricular Rate:  78 PR Interval:  144 QRS Duration: 138 QT Interval:  400 QTC Calculation: 456 R Axis:   -62 Text Interpretation: Normal sinus rhythm Right bundle branch block Left anterior fascicular block  Bifascicular block  Minimal voltage  criteria for LVH, may be normal variant ( R in aVL ) Abnormal ECG When compared with ECG of 02-Jan-2020 15:34, PREVIOUS ECG IS PRESENT since last tracing no significant change Confirmed by Noemi Chapel [phone]) on 04/20/2022 5:58:47 PM  Radiology No results found.  Procedures Procedures    Medications Ordered in ED Medications  hydrochlorothiazide (HYDRODIURIL) tablet 25  mg (25 mg Oral Given 04/20/22 1839)    ED Course/ Medical Decision Making/ A&P                           Medical Decision Making Risk Prescription drug management.   The patient's EKG is unremarkable, it is unchanged from prior, the patient is not tachycardic, he is not hypotensive in fact his blood pressure is closer to 253 systolic.  He is concerned about the numbers but he is totally asymptomatic.  The patient will need to have some adjustment in his medications, he is agreeable to this, he is on a low-dose metoprolol but his heart rate ranges between 60 and 70 so I do not want to go up too much on that.  We may have to add some additional medications for home, the patient is agreeable to this plan  The patient's blood pressure has improved, the patient is definitely stable for discharge, we will start hydrochlorothiazide as an outpatient and have him follow-up in the outpatient setting, the patient is agreeable to the plan        Final Clinical Impression(s) / ED Diagnoses Final diagnoses:  Primary hypertension    Rx / DC Orders ED Discharge Orders          Ordered    hydrochlorothiazide (HYDRODIURIL) 12.5 MG tablet  Daily        04/20/22 1918              Noemi Chapel, MD 04/20/22 1920

## 2022-04-20 NOTE — ED Triage Notes (Signed)
Presents with concerns of elevated BP and lightheadedness that started yesterday.

## 2022-04-20 NOTE — ED Notes (Signed)
Pt in gown and on monitor 

## 2022-04-21 ENCOUNTER — Ambulatory Visit (INDEPENDENT_AMBULATORY_CARE_PROVIDER_SITE_OTHER): Payer: Medicare Other | Admitting: Family Medicine

## 2022-04-21 VITALS — BP 130/100 | HR 93 | Temp 97.7°F | Ht 66.0 in | Wt 205.0 lb

## 2022-04-21 DIAGNOSIS — I10 Essential (primary) hypertension: Secondary | ICD-10-CM | POA: Diagnosis not present

## 2022-04-21 MED ORDER — AMLODIPINE BESYLATE 10 MG PO TABS: 10.0000 mg | ORAL_TABLET | Freq: Every day | ORAL | 3 refills | Status: DC

## 2022-04-21 NOTE — Progress Notes (Signed)
Subjective:    Patient ID: Mike Davila, male    DOB: 07/23/1946, 76 y.o.   MRN: [id number]  HPI Patient has borderline stage IV chronic kidney disease.  Patient has stopped using his Lasix because he was no longer experiencing leg swelling.  Recently his blood pressure has been running higher with a systolic blood pressure in the 150 range and diastolic blood pressure up to 100.  Yesterday it was extremely high prompting him to go to the emergency room.  They started him on hydrochlorothiazide and this morning his blood pressure is down into the 130/100 range.  However he has a history of gout and borderline stage IV chronic kidney disease.  We discussed continuing hydrochlorothiazide however I warned him that he has to avoid dehydration.  Patient works outside as a Psychologist, sport and exercise and is prone to become dehydrated.  So I am worried about hydrochlorothiazide potentially causing problems with his renal function down the road with dehydration.  He has tolerated amlodipine in the past Past Medical History:  Diagnosis Date   Anemia    Benign localized prostatic hyperplasia with lower urinary tract symptoms (LUTS)    CKD (chronic kidney disease), stage III [facility])    nephrologist-  dr Hinda Lenis ([facility] in [place])-- per pt last visit 06/ 2019   Diverticulosis of colon    ED (erectile dysfunction)    History of acute gouty arthritis 10/23/2017   History of acute renal failure 10/23/2017   hospital admission-- w/ acute tubular necrosis superimposed on ckd 3--- resolved   History of diverticulitis of colon 10/05/2017   admission-- mild-- resolved w/ medical management   History of metabolic acidosis 93/73/4287   admission-- resolved   History of palpitations    approx. 2009, per pt no issue since   History of urinary retention 10/2017   Hypertension    Incomplete right bundle branch block    Mixed hyperlipidemia    Nephrolithiasis    left side nonobstructive   Pre-diabetes    Rheumatoid  arthritis ([place])    Right ureteral stone    Wears dentures    upper    Past Surgical History:  Procedure Laterality Date   ANKLE SURGERY Bilateral 1990s to early 2000s   removal bone spurs   APPENDECTOMY  child   COLONOSCOPY N/A 10/27/2017   Procedure: COLONOSCOPY;  Surgeon: Rogene Houston, MD;  [facility]: [facility];  Service: Endoscopy;  Laterality: N/A;   CYSTOSCOPY/URETEROSCOPY/HOLMIUM LASER/STENT PLACEMENT Right 05/09/2018   Procedure: CYSTOSCOPY RIGHT URETEROSCOPY/HOLMIUM LASER/STENT PLACEMENT;  Surgeon: Ardis Hughs, MD;  Location: [facility];  Service: Urology;  Laterality: Right;   HEMATOMA EVACUATION  1990s   right calf   INGUINAL HERNIA REPAIR Bilateral age 7s   PLANTAR FASCIA SURGERY Left 2004   TONSILLECTOMY  child   TRANSTHORACIC ECHOCARDIOGRAM  10/24/2017   ef 60-65%,  grade 1 diastolic dysfunction   Current Outpatient Medications on File Prior to Visit  Medication Sig Dispense Refill   Accu-Chek Softclix Lancets lancets Check bs bid DX: R73.03 100 each 12   albuterol (VENTOLIN HFA) 108 (90 Base) MCG/ACT inhaler Inhale 2 puffs into the lungs 3 (three) times daily. 8 g 1   alfuzosin (UROXATRAL) 10 MG 24 hr tablet TAKE 1 TABLET BY MOUTH ONCE A DAY. 90 tablet 0   ascorbic acid (VITAMIN C) 500 MG tablet Take 1 tablet (500 mg total) by mouth daily. 30 tablet 1   Blood Glucose Monitoring Suppl (ACCU-CHEK GUIDE) w/Device KIT 1  Units by Does not apply route in the morning and at bedtime. 1 kit 0   furosemide (LASIX) 40 MG tablet TAKE (1) TABLET BY MOUTH TWICE DAILY. 180 tablet 0   glucose blood (ACCU-CHEK GUIDE) test strip Check bs bid DX: R73.03 100 each 3   hydrochlorothiazide (HYDRODIURIL) 12.5 MG tablet Take 1 tablet (12.5 mg total) by mouth daily. 30 tablet 1   Lancets Misc. (ACCU-CHEK SOFTCLIX LANCET DEV) KIT Check bs bid DX: R73.03 1 kit 0   metoprolol succinate (TOPROL-XL) 25 MG 24 hr tablet TAKE ONE TABLET BY MOUTH DAILY 90 tablet 2    Multiple Vitamin (MULTIVITAMIN WITH MINERALS) TABS tablet Take 1 tablet by mouth daily. 30 tablet 1   OXYGEN Inhale 3 L into the lungs. Continous (Patient not taking: Reported on 01/03/2022)     potassium chloride SA (KLOR-CON) 20 MEQ tablet Take 1 tablet (20 mEq total) by mouth daily. 90 tablet 3   predniSONE (DELTASONE) 20 MG tablet 3 tabs poqday 1-2, 2 tabs poqday 3-4, 1 tab poqday 5-6 12 tablet 0   valsartan (DIOVAN) 160 MG tablet TAKE ONE TABLET BY MOUTH ONCE DAILY. 90 tablet 1   valsartan (DIOVAN) 80 MG tablet Take 1 tablet (80 mg total) by mouth at bedtime as needed for up to 30 doses. Take if systolic blood pressure (top number) is above 150 mmhg in the evening. 30 tablet 0   No current facility-administered medications on file prior to visit.   Allergies  Allergen Reactions   Sulfa Antibiotics Hives and Shortness Of Breath   Crestor [Rosuvastatin Calcium] Other (See Comments)    Leg cramps   Lipitor [Atorvastatin Calcium] Other (See Comments)    Leg cramps   Tetanus Toxoids Other (See Comments)    Unknown reaction   Social History   Socioeconomic History   Marital status: Single    Spouse name: Not on file   Number of children: Not on file   Years of education: Not on file   Highest education level: Not on file  Occupational History   Not on file  Tobacco Use   Smoking status: Former    Years: 20.00    Types: Cigarettes    Quit date: 05/04/2006    Years since quitting: 15.9   Smokeless tobacco: Former    Types: Chew    Quit date: 05/04/2017  Vaping Use   Vaping Use: Never used  Substance and Sexual Activity   Alcohol use: No   Drug use: No   Sexual activity: Yes  Other Topics Concern   Not on file  Social History Narrative   Married.   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   Social Determinants of Health   Financial Resource Strain: Low Risk  (08/13/2021)   Overall Financial Resource Strain (CARDIA)    Difficulty of Paying Living Expenses: Not hard at all  Food Insecurity: No  Food Insecurity (01/03/2022)   Hunger Vital Sign    Worried About Running Out of Food in the Last Year: Never true    Ran Out of Food in the Last Year: Never true  Transportation Needs: No Transportation Needs (01/03/2022)   PRAPARE - Hydrologist (Medical): No    Lack of Transportation (Non-Medical): No  Physical Activity: Sufficiently Active (08/13/2021)   Exercise Vital Sign    Days of Exercise per Week: 5 days    Minutes of Exercise per Session: 30 min  Stress: No Stress Concern Present (08/13/2021)   [employer] of  Occupational Health - Occupational Stress Questionnaire    Feeling of Stress : Not at all  Social Connections: Unknown (08/13/2021)   Social Connection and Isolation Panel [NHANES]    Frequency of Communication with Friends and Family: More than three times a week    Frequency of Social Gatherings with Friends and Family: More than three times a week    Attends Religious Services: Not on file    Active Member of Clubs or Organizations: Yes    Attends Archivist Meetings: More than 4 times per year    Marital Status: Married  Human resources officer Violence: Not At Risk (08/13/2021)   Humiliation, Afraid, Rape, and Kick questionnaire    Fear of Current or Ex-Partner: No    Emotionally Abused: No    Physically Abused: No    Sexually Abused: No   Family History  Problem Relation Age of Onset   Thyroid cancer Mother    Heart disease Father        MI   Colon cancer Father    Melanoma Father    Benign prostatic hyperplasia Brother    Diabetes Mellitus II Maternal Grandmother    Ovarian cancer Daughter     Review of Systems     Objective:   Physical Exam Vitals reviewed.  Constitutional:      General: He is not in acute distress.    Appearance: Normal appearance. He is normal weight. He is not ill-appearing or toxic-appearing.  Cardiovascular:     Rate and Rhythm: Normal rate and regular rhythm.     Heart sounds: Normal  heart sounds. No murmur heard.    No gallop.  Pulmonary:     Effort: Pulmonary effort is normal. No respiratory distress.     Breath sounds: Normal breath sounds. No stridor. No wheezing, rhonchi or rales.  Skin:    Findings: No rash.  Neurological:     General: No focal deficit present.     Mental Status: He is alert and oriented to person, place, and time. Mental status is at baseline.     Cranial Nerves: No cranial nerve deficit.        Assessment & Plan:  Primary hypertension Recommended he stop hydrochlorothiazide due to the reasons specified in the history of present illness.  Instead use amlodipine 10 mg a day.  Monitor for leg swelling.  He can use furosemide as needed for leg swelling.  If he takes furosemide he is to take potassium pill with it.  Otherwise monitor blood pressure and recheck blood pressure in 2 weeks

## 2022-04-22 ENCOUNTER — Telehealth: Payer: Self-pay

## 2022-04-22 NOTE — Telephone Encounter (Signed)
Pt called and stated that his bp/ this moring was 117/84. Pt took bp meds afterwards: 1-tab of amlodipine/1-metoprol. However,  as of right now his b/p is at 83/69, per pt has no symptoms of any kind. Per pt what should he do when his bp started off being around the 117-120 range?   Told pt I will give Dr. Dennard Schaumann the message and let him know.

## 2022-04-22 NOTE — Telephone Encounter (Signed)
Pt called and stated that his bp/ this moring was 117/84. Pt took bp meds afterwards: 1-tab of amlodipine/1-metoprol. However,  as of right now his b/p is at 83/69, per pt has no symptoms of any kind. Per pt what should he do when his bp started off being around the 117-120 range?    Told pt I will give Dr. Dennard Schaumann the message and let him know.     04/22/22  Call and spoke with pt, per Dr. Dennard Schaumann, advice pt to take 1/2 of the  amlopdipine along with the metoprol if b/p is in low range. However, if bp still low, then don't take the amlodipine at all.  Pt voiced understanding

## 2022-05-02 ENCOUNTER — Ambulatory Visit: Payer: Self-pay | Admitting: *Deleted

## 2022-05-02 DIAGNOSIS — I10 Essential (primary) hypertension: Secondary | ICD-10-CM

## 2022-05-02 DIAGNOSIS — E785 Hyperlipidemia, unspecified: Secondary | ICD-10-CM

## 2022-05-02 NOTE — Chronic Care Management (AMB) (Signed)
   05/02/2022  Mike Davila 1946-01-15 [id number]   Patient cancelled last scheduled follow up appointment with RN care manager and did not reschedule.  Care plan updated and resolved,  Case closed.  Jacqlyn Larsen RNC, BSN RN Case Manager [facility] [phone]

## 2022-05-19 DIAGNOSIS — M0579 Rheumatoid arthritis with rheumatoid factor of multiple sites without organ or systems involvement: Secondary | ICD-10-CM | POA: Diagnosis not present

## 2022-05-19 DIAGNOSIS — Z79899 Other long term (current) drug therapy: Secondary | ICD-10-CM | POA: Diagnosis not present

## 2022-05-19 DIAGNOSIS — Z8739 Personal history of other diseases of the musculoskeletal system and connective tissue: Secondary | ICD-10-CM | POA: Diagnosis not present

## 2022-05-19 DIAGNOSIS — N189 Chronic kidney disease, unspecified: Secondary | ICD-10-CM | POA: Diagnosis not present

## 2022-05-19 DIAGNOSIS — E782 Mixed hyperlipidemia: Secondary | ICD-10-CM | POA: Diagnosis not present

## 2022-05-19 DIAGNOSIS — I10 Essential (primary) hypertension: Secondary | ICD-10-CM | POA: Diagnosis not present

## 2022-05-26 ENCOUNTER — Other Ambulatory Visit: Payer: Self-pay | Admitting: *Deleted

## 2022-05-26 ENCOUNTER — Other Ambulatory Visit: Payer: Self-pay

## 2022-05-26 ENCOUNTER — Other Ambulatory Visit: Payer: Medicare Other

## 2022-05-26 ENCOUNTER — Telehealth: Payer: Self-pay

## 2022-05-26 DIAGNOSIS — N390 Urinary tract infection, site not specified: Secondary | ICD-10-CM

## 2022-05-26 NOTE — Patient Outreach (Signed)
  Care Coordination   05/26/2022 Name: Mike Davila MRN: [id number] DOB: 1946-04-17   Care Coordination Outreach Attempts:  An unsuccessful telephone outreach was attempted today to offer the patient information about available care coordination services as a benefit of their health plan.   Follow Up Plan:  Additional outreach attempts will be made to offer the patient care coordination information and services.   Encounter Outcome:  No Answer  Care Coordination Interventions Activated:  No   Care Coordination Interventions:  No, not indicated    Emelia Loron RN, BSN [place] [phone] [email or web address]

## 2022-05-26 NOTE — Telephone Encounter (Signed)
Pt came in to leave a urine sample to test for a possible UTI.  Cb#: [phone]

## 2022-05-27 ENCOUNTER — Other Ambulatory Visit: Payer: Self-pay | Admitting: Family Medicine

## 2022-05-27 LAB — URINALYSIS, ROUTINE W REFLEX MICROSCOPIC
Bacteria, UA: NONE SEEN /HPF
Bilirubin Urine: NEGATIVE
Glucose, UA: NEGATIVE
Hgb urine dipstick: NEGATIVE
Hyaline Cast: NONE SEEN /LPF
Ketones, ur: NEGATIVE
Nitrite: NEGATIVE
Protein, ur: NEGATIVE
RBC / HPF: NONE SEEN /HPF (ref 0–2)
Specific Gravity, Urine: 1.009 (ref 1.001–1.035)
Squamous Epithelial / HPF: NONE SEEN /HPF (ref <=5)
pH: 6 (ref 5.0–8.0)

## 2022-05-27 LAB — MICROSCOPIC MESSAGE

## 2022-05-27 MED ORDER — CEPHALEXIN 500 MG PO CAPS: 500.0000 mg | ORAL_CAPSULE | Freq: Three times a day (TID) | ORAL | 0 refills | Status: DC

## 2022-05-29 ENCOUNTER — Other Ambulatory Visit: Payer: Self-pay

## 2022-05-29 ENCOUNTER — Emergency Department (HOSPITAL_COMMUNITY)
Admission: EM | Admit: 2022-05-29 | Discharge: 2022-05-29 | Disposition: A | Discharge status: Home / Self Care | Payer: Medicare Other | Attending: Emergency Medicine | Admitting: Emergency Medicine

## 2022-05-29 ENCOUNTER — Encounter (HOSPITAL_COMMUNITY): Payer: Self-pay | Admitting: Emergency Medicine

## 2022-05-29 DIAGNOSIS — R1084 Generalized abdominal pain: Secondary | ICD-10-CM | POA: Diagnosis not present

## 2022-05-29 DIAGNOSIS — R339 Retention of urine, unspecified: Secondary | ICD-10-CM | POA: Diagnosis not present

## 2022-05-29 DIAGNOSIS — Z79899 Other long term (current) drug therapy: Secondary | ICD-10-CM | POA: Insufficient documentation

## 2022-05-29 LAB — URINALYSIS, ROUTINE W REFLEX MICROSCOPIC
Bilirubin Urine: NEGATIVE
Glucose, UA: NEGATIVE mg/dL
Hgb urine dipstick: NEGATIVE
Ketones, ur: 5 mg/dL — AB
Leukocytes,Ua: NEGATIVE
Nitrite: NEGATIVE
Protein, ur: NEGATIVE mg/dL
Specific Gravity, Urine: 1.013 (ref 1.005–1.030)
pH: 6 (ref 5.0–8.0)

## 2022-05-29 LAB — BASIC METABOLIC PANEL
Anion gap: 3 — ABNORMAL LOW (ref 5–15)
BUN: 22 mg/dL (ref 8–23)
CO2: 24 mmol/L (ref 22–32)
Calcium: 8.6 mg/dL — ABNORMAL LOW (ref 8.9–10.3)
Chloride: 110 mmol/L (ref 98–111)
Creatinine, Ser: 1.74 mg/dL — ABNORMAL HIGH (ref 0.61–1.24)
GFR, Estimated: 41 mL/min — ABNORMAL LOW (ref >60)
Glucose, Bld: 110 mg/dL — ABNORMAL HIGH (ref 70–99)
Potassium: 3.8 mmol/L (ref 3.5–5.1)
Sodium: 137 mmol/L (ref 135–145)

## 2022-05-29 LAB — CBC WITH DIFFERENTIAL/PLATELET
Abs Immature Granulocytes: 0.01 10*3/uL (ref 0.00–0.07)
Basophils Absolute: 0 10*3/uL (ref 0.0–0.1)
Basophils Relative: 1 %
Eosinophils Absolute: 0.1 10*3/uL (ref 0.0–0.5)
Eosinophils Relative: 2 %
HCT: 43.5 % (ref 39.0–52.0)
Hemoglobin: 14.1 g/dL (ref 13.0–17.0)
Immature Granulocytes: 0 %
Lymphocytes Relative: 11 %
Lymphs Abs: 0.5 10*3/uL — ABNORMAL LOW (ref 0.7–4.0)
MCH: 27.1 pg (ref 26.0–34.0)
MCHC: 32.4 g/dL (ref 30.0–36.0)
MCV: 83.5 fL (ref 80.0–100.0)
Monocytes Absolute: 0.4 10*3/uL (ref 0.1–1.0)
Monocytes Relative: 10 %
Neutro Abs: 3.5 10*3/uL (ref 1.7–7.7)
Neutrophils Relative %: 76 %
Platelets: 156 10*3/uL (ref 150–400)
RBC: 5.21 MIL/uL (ref 4.22–5.81)
RDW: 17.3 % — ABNORMAL HIGH (ref 11.5–15.5)
WBC: 4.6 10*3/uL (ref 4.0–10.5)
nRBC: 0 % (ref 0.0–0.2)

## 2022-05-29 NOTE — ED Provider Notes (Signed)
[facility] EMERGENCY DEPARTMENT Provider Note   CSN: [id number] Arrival date & time: 05/29/22  2263     History  Chief Complaint  Patient presents with   Urinary Retention    Mike Davila is a 76 y.o. male.  Patient complains of abdominal pain because he cannot urinate.  No vomiting no fever.  Patient has a history of kidney stones   Abdominal Pain Pain location:  Generalized Pain quality: aching   Pain radiates to:  Does not radiate Pain severity:  Moderate Onset quality:  Sudden Timing:  Constant Progression:  Waxing and waning Chronicity:  New Context: not alcohol use   Relieved by:  Nothing Worsened by:  Nothing Ineffective treatments:  None tried Associated symptoms: no chest pain, no cough, no diarrhea, no fatigue and no hematuria        Home Medications Prior to Admission medications   Medication Sig Start Date End Date Taking? Authorizing Provider  Accu-Chek Softclix Lancets lancets Check bs bid DX: R73.03 02/10/20   [name], MD  albuterol (VENTOLIN HFA) 108 (90 Base) MCG/ACT inhaler Inhale 2 puffs into the lungs 3 (three) times daily. 01/14/20   Barton Dubois, MD  alfuzosin (UROXATRAL) 10 MG 24 hr tablet TAKE 1 TABLET BY MOUTH ONCE A DAY. 04/11/22   [name], MD  amLODipine (NORVASC) 10 MG tablet Take 1 tablet (10 mg total) by mouth daily. 04/21/22   [name], MD  ascorbic acid (VITAMIN C) 500 MG tablet Take 1 tablet (500 mg total) by mouth daily. 01/15/20   Barton Dubois, MD  Blood Glucose Monitoring Suppl (ACCU-CHEK GUIDE) w/Device KIT 1 Units by Does not apply route in the morning and at bedtime. 02/10/20   [name], MD  cephALEXin (KEFLEX) 500 MG capsule Take 1 capsule (500 mg total) by mouth 3 (three) times daily. 05/27/22   [name], MD  furosemide (LASIX) 40 MG tablet TAKE (1) TABLET BY MOUTH TWICE DAILY. Patient taking differently: Take 40 mg by mouth daily as needed. 12/30/21   [name], MD   glucose blood (ACCU-CHEK GUIDE) test strip Check bs bid DX: R73.03 02/10/20   [name], MD  Lancets Misc. (ACCU-CHEK SOFTCLIX LANCET DEV) KIT Check bs bid DX: R73.03 02/10/20   [name], MD  metoprolol succinate (TOPROL-XL) 25 MG 24 hr tablet TAKE ONE TABLET BY MOUTH DAILY 09/08/21   [name], MD  Multiple Vitamin (MULTIVITAMIN WITH MINERALS) TABS tablet Take 1 tablet by mouth daily. 01/15/20   Barton Dubois, MD  OXYGEN Inhale 3 L into the lungs. Continous Patient not taking: Reported on 01/03/2022    [provider]  potassium chloride SA (KLOR-CON) 20 MEQ tablet Take 1 tablet (20 mEq total) by mouth daily. 03/04/20   [name], MD  valsartan (DIOVAN) 160 MG tablet TAKE ONE TABLET BY MOUTH ONCE DAILY. 11/25/21   [name], MD      Allergies    Sulfa antibiotics, Crestor [rosuvastatin calcium], Lipitor [atorvastatin calcium], and Tetanus toxoids    Review of Systems   Review of Systems  Constitutional:  Negative for appetite change and fatigue.  HENT:  Negative for congestion, ear discharge and sinus pressure.   Eyes:  Negative for discharge.  Respiratory:  Negative for cough.   Cardiovascular:  Negative for chest pain.  Gastrointestinal:  Positive for abdominal pain. Negative for diarrhea.  Genitourinary:  Negative for frequency and hematuria.  Urinary retention  Musculoskeletal:  Negative for back pain.  Skin:  Negative for rash.  Neurological:  Negative for seizures and headaches.  Psychiatric/Behavioral:  Negative for hallucinations.     Physical Exam Updated Vital Signs BP (!) 157/95   Pulse 65   Temp 97.8 F (36.6 C) (Oral)   Resp 18   Ht 5' 6"  (1.676 m)   Wt 93 kg   SpO2 93%   BMI 33.09 kg/m  Physical Exam Vitals and nursing note reviewed.  Constitutional:      Appearance: Normal appearance. He is well-developed.  HENT:     Head: Normocephalic.     Mouth/Throat:     Mouth: Mucous membranes are moist.  Eyes:      General: No scleral icterus.    Conjunctiva/sclera: Conjunctivae normal.  Neck:     Thyroid: No thyromegaly.  Cardiovascular:     Rate and Rhythm: Normal rate and regular rhythm.     Heart sounds: No murmur heard.    No friction rub. No gallop.  Pulmonary:     Breath sounds: No stridor. No wheezing or rales.  Chest:     Chest wall: No tenderness.  Abdominal:     General: There is no distension.     Tenderness: There is abdominal tenderness. There is no rebound.  Musculoskeletal:        General: Normal range of motion.     Cervical back: Neck supple.  Lymphadenopathy:     Cervical: No cervical adenopathy.  Skin:    Findings: No erythema or rash.  Neurological:     Mental Status: He is alert and oriented to person, place, and time.     Motor: No abnormal muscle tone.     Coordination: Coordination normal.  Psychiatric:        Behavior: Behavior normal.     ED Results / Procedures / Treatments   Labs (all labs ordered are listed, but only abnormal results are displayed) Labs Reviewed  URINALYSIS, ROUTINE W REFLEX MICROSCOPIC - Abnormal; Notable for the following components:      Result Value   Ketones, ur 5 (*)    All other components within normal limits  CBC WITH DIFFERENTIAL/PLATELET - Abnormal; Notable for the following components:   RDW 17.3 (*)    Lymphs Abs 0.5 (*)    All other components within normal limits  BASIC METABOLIC PANEL - Abnormal; Notable for the following components:   Glucose, Bld 110 (*)    Creatinine, Ser 1.74 (*)    Calcium 8.6 (*)    GFR, Estimated 41 (*)    Anion gap 3 (*)    All other components within normal limits    EKG None  Radiology No results found.  Procedures Procedures    Medications Ordered in ED Medications - No data to display  ED Course/ Medical Decision Making/ A&P                           Medical Decision Making Amount and/or Complexity of Data Reviewed Labs: ordered.  This patient presents to the  ED for concern of abdominal pain and urinary retention, this involves an extensive number of treatment options, and is a complaint that carries with it a high risk of complications and morbidity.  The differential diagnosis includes large prostate, UTI   Co morbidities that complicate the patient evaluation  History of kidney stones   Additional history obtained:  Additional history obtained  from family External records from outside source obtained and reviewed including hospital records   Lab Tests:  I Ordered, and personally interpreted labs.  The pertinent results include: Creatinine 1.74.  White count 4.6 hemoglobin 14.1   Imaging Studies ordered: No image Cardiac Monitoring: / EKG:  The patient was maintained on a cardiac monitor.  I personally viewed and interpreted the cardiac monitored which showed an underlying rhythm of: Normal sinus rhythm   Consultations Obtained:  No consult  Problem List / ED Course / Critical interventions / Medication management  Urinary retention No medicine Reevaluation of the patient after these medicines showed that the patient improved I have reviewed the patients home medicines and have made adjustments as needed   Social Determinants of Health:  None   Test / Admission - Considered:  None  Patient with urinary retention.  They had a Foley placed and feels much better.  He will follow-up with urology.        Final Clinical Impression(s) / ED Diagnoses Final diagnoses:  Urinary retention    Rx / DC Orders ED Discharge Orders     None         Milton Ferguson, MD 05/29/22 1240

## 2022-05-29 NOTE — ED Triage Notes (Signed)
Urinary retention x 5 days. Pt states he is able to push a little urine out but has a lot of pressure and has been unable to have a good flow of urine. Has an enlarged prostate and has had to be catherized in the past. Pt requesting to be cath to get relief. Pt is on antibiotics for UTI for 2 days with no change in symptoms. Pt also complains of lower back pain primarily on right side. Pt has known kidney stones for 6-7 years.

## 2022-05-29 NOTE — Discharge Instructions (Signed)
Follow-up with your urologist this week. °

## 2022-06-02 DIAGNOSIS — R339 Retention of urine, unspecified: Secondary | ICD-10-CM | POA: Diagnosis not present

## 2022-06-02 DIAGNOSIS — N138 Other obstructive and reflux uropathy: Secondary | ICD-10-CM | POA: Diagnosis not present

## 2022-06-02 DIAGNOSIS — N401 Enlarged prostate with lower urinary tract symptoms: Secondary | ICD-10-CM | POA: Diagnosis not present

## 2022-06-02 DIAGNOSIS — Z466 Encounter for fitting and adjustment of urinary device: Secondary | ICD-10-CM | POA: Diagnosis not present

## 2022-06-04 ENCOUNTER — Other Ambulatory Visit: Payer: Self-pay | Admitting: Family Medicine

## 2022-06-06 NOTE — Telephone Encounter (Signed)
Requested Prescriptions  Pending Prescriptions Disp Refills  . metoprolol succinate (TOPROL-XL) 25 MG 24 hr tablet [Pharmacy Med Name: METOPROLOL SUCC ER 25 MG TAB] 90 tablet 0    Sig: TAKE ONE TABLET BY MOUTH DAILY     Cardiovascular:  Beta Blockers Failed - 06/04/2022  8:47 AM      Failed - Last BP in normal range    BP Readings from Last 1 Encounters:  05/29/22 (!) 164/102         Passed - Last Heart Rate in normal range    Pulse Readings from Last 1 Encounters:  05/29/22 69         Passed - Valid encounter within last 6 months    Recent Outpatient Visits          4 months ago [place] Dennard Schaumann, Cammie Mcgee, MD   1 year ago Vasculitis [facility])   [place] Pickard, Cammie Mcgee, MD   1 year ago Primary hypertension   [place] Pickard, Cammie Mcgee, MD   1 year ago Muscle weakness (generalized)   [place] Pickard, Cammie Mcgee, MD   1 year ago Prediabetes   [place] Pickard, Cammie Mcgee, MD

## 2022-06-07 ENCOUNTER — Other Ambulatory Visit: Payer: Self-pay | Admitting: Family Medicine

## 2022-06-07 MED ORDER — METOPROLOL SUCCINATE ER 25 MG PO TB24: 25.0000 mg | ORAL_TABLET | Freq: Every day | ORAL | 0 refills | Status: DC

## 2022-06-09 DIAGNOSIS — R339 Retention of urine, unspecified: Secondary | ICD-10-CM | POA: Diagnosis not present

## 2022-06-11 DIAGNOSIS — N200 Calculus of kidney: Secondary | ICD-10-CM | POA: Diagnosis not present

## 2022-06-11 DIAGNOSIS — Z79899 Other long term (current) drug therapy: Secondary | ICD-10-CM | POA: Diagnosis not present

## 2022-06-11 DIAGNOSIS — K760 Fatty (change of) liver, not elsewhere classified: Secondary | ICD-10-CM | POA: Diagnosis not present

## 2022-06-11 DIAGNOSIS — Z887 Allergy status to serum and vaccine status: Secondary | ICD-10-CM | POA: Diagnosis not present

## 2022-06-11 DIAGNOSIS — Z882 Allergy status to sulfonamides status: Secondary | ICD-10-CM | POA: Diagnosis not present

## 2022-06-11 DIAGNOSIS — R338 Other retention of urine: Secondary | ICD-10-CM | POA: Diagnosis present

## 2022-06-11 DIAGNOSIS — N138 Other obstructive and reflux uropathy: Secondary | ICD-10-CM | POA: Diagnosis present

## 2022-06-11 DIAGNOSIS — N401 Enlarged prostate with lower urinary tract symptoms: Secondary | ICD-10-CM | POA: Diagnosis present

## 2022-06-11 DIAGNOSIS — N3090 Cystitis, unspecified without hematuria: Secondary | ICD-10-CM | POA: Diagnosis not present

## 2022-06-11 DIAGNOSIS — N390 Urinary tract infection, site not specified: Secondary | ICD-10-CM | POA: Diagnosis not present

## 2022-06-11 DIAGNOSIS — N281 Cyst of kidney, acquired: Secondary | ICD-10-CM | POA: Diagnosis not present

## 2022-06-11 DIAGNOSIS — I129 Hypertensive chronic kidney disease with stage 1 through stage 4 chronic kidney disease, or unspecified chronic kidney disease: Secondary | ICD-10-CM | POA: Diagnosis present

## 2022-06-11 DIAGNOSIS — K5732 Diverticulitis of large intestine without perforation or abscess without bleeding: Secondary | ICD-10-CM | POA: Diagnosis not present

## 2022-06-11 DIAGNOSIS — N1832 Chronic kidney disease, stage 3b: Secondary | ICD-10-CM | POA: Diagnosis not present

## 2022-06-11 DIAGNOSIS — N3289 Other specified disorders of bladder: Secondary | ICD-10-CM | POA: Diagnosis not present

## 2022-06-11 DIAGNOSIS — N179 Acute kidney failure, unspecified: Secondary | ICD-10-CM | POA: Diagnosis present

## 2022-06-11 DIAGNOSIS — R310 Gross hematuria: Secondary | ICD-10-CM | POA: Diagnosis present

## 2022-06-11 DIAGNOSIS — Z888 Allergy status to other drugs, medicaments and biological substances status: Secondary | ICD-10-CM | POA: Diagnosis not present

## 2022-06-11 DIAGNOSIS — R339 Retention of urine, unspecified: Secondary | ICD-10-CM | POA: Diagnosis not present

## 2022-06-11 DIAGNOSIS — D72819 Decreased white blood cell count, unspecified: Secondary | ICD-10-CM | POA: Diagnosis not present

## 2022-06-12 DIAGNOSIS — N179 Acute kidney failure, unspecified: Secondary | ICD-10-CM | POA: Diagnosis not present

## 2022-06-12 DIAGNOSIS — R310 Gross hematuria: Secondary | ICD-10-CM | POA: Diagnosis not present

## 2022-06-12 DIAGNOSIS — R339 Retention of urine, unspecified: Secondary | ICD-10-CM | POA: Diagnosis not present

## 2022-06-13 DIAGNOSIS — Z887 Allergy status to serum and vaccine status: Secondary | ICD-10-CM | POA: Diagnosis not present

## 2022-06-13 DIAGNOSIS — Z882 Allergy status to sulfonamides status: Secondary | ICD-10-CM | POA: Diagnosis not present

## 2022-06-13 DIAGNOSIS — N138 Other obstructive and reflux uropathy: Secondary | ICD-10-CM | POA: Diagnosis present

## 2022-06-13 DIAGNOSIS — N3289 Other specified disorders of bladder: Secondary | ICD-10-CM | POA: Diagnosis not present

## 2022-06-13 DIAGNOSIS — I129 Hypertensive chronic kidney disease with stage 1 through stage 4 chronic kidney disease, or unspecified chronic kidney disease: Secondary | ICD-10-CM | POA: Diagnosis present

## 2022-06-13 DIAGNOSIS — R338 Other retention of urine: Secondary | ICD-10-CM | POA: Diagnosis present

## 2022-06-13 DIAGNOSIS — R310 Gross hematuria: Secondary | ICD-10-CM | POA: Diagnosis present

## 2022-06-13 DIAGNOSIS — N200 Calculus of kidney: Secondary | ICD-10-CM | POA: Diagnosis not present

## 2022-06-13 DIAGNOSIS — K760 Fatty (change of) liver, not elsewhere classified: Secondary | ICD-10-CM | POA: Diagnosis not present

## 2022-06-13 DIAGNOSIS — N401 Enlarged prostate with lower urinary tract symptoms: Secondary | ICD-10-CM | POA: Diagnosis present

## 2022-06-13 DIAGNOSIS — R339 Retention of urine, unspecified: Secondary | ICD-10-CM | POA: Diagnosis not present

## 2022-06-13 DIAGNOSIS — N281 Cyst of kidney, acquired: Secondary | ICD-10-CM | POA: Diagnosis not present

## 2022-06-13 DIAGNOSIS — D72819 Decreased white blood cell count, unspecified: Secondary | ICD-10-CM | POA: Diagnosis not present

## 2022-06-13 DIAGNOSIS — N179 Acute kidney failure, unspecified: Secondary | ICD-10-CM | POA: Diagnosis present

## 2022-06-13 DIAGNOSIS — Z888 Allergy status to other drugs, medicaments and biological substances status: Secondary | ICD-10-CM | POA: Diagnosis not present

## 2022-06-13 DIAGNOSIS — N1832 Chronic kidney disease, stage 3b: Secondary | ICD-10-CM | POA: Diagnosis present

## 2022-06-13 DIAGNOSIS — N3090 Cystitis, unspecified without hematuria: Secondary | ICD-10-CM | POA: Diagnosis not present

## 2022-06-13 DIAGNOSIS — Z79899 Other long term (current) drug therapy: Secondary | ICD-10-CM | POA: Diagnosis not present

## 2022-06-13 DIAGNOSIS — K5732 Diverticulitis of large intestine without perforation or abscess without bleeding: Secondary | ICD-10-CM | POA: Diagnosis not present

## 2022-06-21 DIAGNOSIS — N411 Chronic prostatitis: Secondary | ICD-10-CM | POA: Diagnosis not present

## 2022-06-21 DIAGNOSIS — N400 Enlarged prostate without lower urinary tract symptoms: Secondary | ICD-10-CM | POA: Diagnosis not present

## 2022-06-21 DIAGNOSIS — N410 Acute prostatitis: Secondary | ICD-10-CM | POA: Diagnosis not present

## 2022-06-22 DIAGNOSIS — R339 Retention of urine, unspecified: Secondary | ICD-10-CM | POA: Diagnosis not present

## 2022-06-22 DIAGNOSIS — N401 Enlarged prostate with lower urinary tract symptoms: Secondary | ICD-10-CM | POA: Diagnosis not present

## 2022-06-22 DIAGNOSIS — Z466 Encounter for fitting and adjustment of urinary device: Secondary | ICD-10-CM | POA: Diagnosis not present

## 2022-06-22 DIAGNOSIS — R338 Other retention of urine: Secondary | ICD-10-CM | POA: Diagnosis not present

## 2022-06-28 ENCOUNTER — Ambulatory Visit (INDEPENDENT_AMBULATORY_CARE_PROVIDER_SITE_OTHER): Payer: Medicare Other | Admitting: Family Medicine

## 2022-06-28 ENCOUNTER — Ambulatory Visit (HOSPITAL_COMMUNITY)
Admission: RE | Admit: 2022-06-28 | Discharge: 2022-06-28 | Disposition: A | Discharge status: Home / Self Care | Payer: Medicare Other | Source: Ambulatory Visit | Attending: Family Medicine | Admitting: Family Medicine

## 2022-06-28 VITALS — BP 118/78 | HR 101 | Temp 97.8°F | Ht 66.0 in | Wt 194.8 lb

## 2022-06-28 DIAGNOSIS — R0600 Dyspnea, unspecified: Secondary | ICD-10-CM | POA: Diagnosis not present

## 2022-06-28 DIAGNOSIS — R0602 Shortness of breath: Secondary | ICD-10-CM | POA: Diagnosis not present

## 2022-06-28 NOTE — Progress Notes (Signed)
Subjective:    Patient ID: Mike Davila, male    DOB: 1946-08-31, 76 y.o.   MRN: [id number]  HPI  Mike Davila is here today for hospital follow-up for gross hematuria resulting from indwelling catheter placement due to urinary retention caused by prostate enlargement. His course ultimately required surgical enucleation of his prostate. Today he reports improved retention and denies hematuria. He continues to have bilateral lower extremity edema and intermittent shortness of breath.    [facility] Encounter Summary 06/12/2022 for reference purposes only:   Mike Davila is a 76 y.o. male with the following diagnoses: 1. Gross hematuria  2. AKI (acute kidney injury) (*)   Recurrent gross hematuria and clot retention in the presence of an indwelling catheter. This is in the setting of urinary retention from enlarged prostate. He is prostatic urethral length is moderate based on how much catheter remains outside of his meatus. I suspect he has mostly benign prostatic bleeding. He currently has a Roush hematuria catheter that can manage clots better than a standard Foley. However I was not able to clear him completely. In the setting of mild AKI, it would be reasonable to admit him for observation and CBI and resuscitation.  We discussed that he will likely ultimately require some sort of outlet procedure. This could include UroLift, transurethral section of prostate, or simple prostatectomy based on his prostate shape and volume. Until he has further evaluation for this, he will need a catheter.  PLAN: -continue CBI, goal to light pink. Anticipate discharge with the current rusch hematuria catheter -currently scheduled for 06/22/22 office cystoscopy and TRUS with Dr. Newton Pigg to determine candidacy for outlet procedures.   Risks, benefits, and alternatives of the medications and treatment plan prescribed today were discussed. Patient and or family expresses understanding and all  questions and concerns were answered. The patient is in agreement with the plan as stated above. Portions of the note were entered using voice recognition software. Minor syntax, contextual, and spelling errors may be related to the use of this software and were not intentional. If corrections are necessary, please contact provider. Past Medical History:  Diagnosis Date   Anemia    Benign localized prostatic hyperplasia with lower urinary tract symptoms (LUTS)    CKD (chronic kidney disease), stage III [facility])    nephrologist-  dr Hinda Lenis ([facility] in [place])-- per pt last visit 06/ 2019   Diverticulosis of colon    ED (erectile dysfunction)    History of acute gouty arthritis 10/23/2017   History of acute renal failure 10/23/2017   hospital admission-- w/ acute tubular necrosis superimposed on ckd 3--- resolved   History of diverticulitis of colon 10/05/2017   admission-- mild-- resolved w/ medical management   History of metabolic acidosis 27/61/4709   admission-- resolved   History of palpitations    approx. 2009, per pt no issue since   History of urinary retention 10/2017   Hypertension    Incomplete right bundle branch block    Mixed hyperlipidemia    Nephrolithiasis    left side nonobstructive   Pre-diabetes    Rheumatoid arthritis ([place])    Right ureteral stone    Wears dentures    upper   Past Surgical History:  Procedure Laterality Date   ANKLE SURGERY Bilateral 1990s to early 2000s   removal bone spurs   APPENDECTOMY  child   COLONOSCOPY N/A 10/27/2017   Procedure: COLONOSCOPY;  Surgeon: Rogene Houston, MD;  [facility];  Service: Endoscopy;  Laterality: N/A;   CYSTOSCOPY/URETEROSCOPY/HOLMIUM LASER/STENT PLACEMENT Right 05/09/2018   Procedure: CYSTOSCOPY RIGHT URETEROSCOPY/HOLMIUM LASER/STENT PLACEMENT;  Surgeon: Ardis Hughs, MD;  Location: [facility];  Service: Urology;  Laterality: Right;   HEMATOMA EVACUATION  1990s   right  calf   INGUINAL HERNIA REPAIR Bilateral age 14s   PLANTAR FASCIA SURGERY Left 2004   TONSILLECTOMY  child   TRANSTHORACIC ECHOCARDIOGRAM  10/24/2017   ef 60-65%,  grade 1 diastolic dysfunction   Current Outpatient Medications on File Prior to Visit  Medication Sig Dispense Refill   Accu-Chek Softclix Lancets lancets Check bs bid DX: R73.03 100 each 12   albuterol (VENTOLIN HFA) 108 (90 Base) MCG/ACT inhaler Inhale 2 puffs into the lungs 3 (three) times daily. 8 g 1   alfuzosin (UROXATRAL) 10 MG 24 hr tablet TAKE 1 TABLET BY MOUTH ONCE A DAY. 90 tablet 0   ascorbic acid (VITAMIN C) 500 MG tablet Take 1 tablet (500 mg total) by mouth daily. 30 tablet 1   Blood Glucose Monitoring Suppl (ACCU-CHEK GUIDE) w/Device KIT 1 Units by Does not apply route in the morning and at bedtime. 1 kit 0   cephALEXin (KEFLEX) 500 MG capsule Take 1 capsule (500 mg total) by mouth 3 (three) times daily. 21 capsule 0   glucose blood (ACCU-CHEK GUIDE) test strip Check bs bid DX: R73.03 100 each 3   Lancets Misc. (ACCU-CHEK SOFTCLIX LANCET DEV) KIT Check bs bid DX: R73.03 1 kit 0   Multiple Vitamin (MULTIVITAMIN WITH MINERALS) TABS tablet Take 1 tablet by mouth daily. 30 tablet 1   OXYGEN Inhale 3 L into the lungs. Continous     potassium chloride SA (KLOR-CON) 20 MEQ tablet Take 1 tablet (20 mEq total) by mouth daily. 90 tablet 3   amLODipine (NORVASC) 10 MG tablet Take 1 tablet (10 mg total) by mouth daily. (Patient not taking: Reported on 06/28/2022) 90 tablet 3   furosemide (LASIX) 40 MG tablet TAKE (1) TABLET BY MOUTH TWICE DAILY. (Patient not taking: Reported on 06/28/2022) 180 tablet 0   metoprolol succinate (TOPROL-XL) 25 MG 24 hr tablet Take 1 tablet (25 mg total) by mouth daily. (Patient not taking: Reported on 06/28/2022) 90 tablet 0   valsartan (DIOVAN) 160 MG tablet TAKE ONE TABLET BY MOUTH ONCE DAILY. (Patient not taking: Reported on 06/28/2022) 90 tablet 1   No current facility-administered medications on file  prior to visit.   Allergies  Allergen Reactions   Sulfa Antibiotics Hives and Shortness Of Breath   Crestor [Rosuvastatin Calcium] Other (See Comments)    Leg cramps   Lipitor [Atorvastatin Calcium] Other (See Comments)    Leg cramps   Tetanus Toxoids Other (See Comments)    Unknown reaction     Review of Systems  Respiratory:  Positive for shortness of breath.   Cardiovascular:  Positive for leg swelling.  Genitourinary:  Negative for difficulty urinating and hematuria.  All other systems reviewed and are negative.      Objective:   Physical Exam Vitals and nursing note reviewed.  Constitutional:      Appearance: Normal appearance. He is normal weight.  HENT:     Head: Normocephalic and atraumatic.  Pulmonary:     Breath sounds: Rales present.  Musculoskeletal:     Right lower leg: Edema present.     Left lower leg: Edema present.  Neurological:     Mental Status: He is alert.  Assessment & Plan:   Dyspnea, unspecified type - Plan: CBC with Differential/Platelet, Brain natriuretic peptide, BASIC METABOLIC PANEL WITH GFR, DG Chest 2 View Patient experienced acute urinary retention secondary to prostatitis.  He underwent 3 separate catheterizations on the third catheterization experienced gross hematuria secondary to a ruptured a blood vessel in his bladder.  He underwent cystoscopy and surgery to stop the acute bleed.  Patient has been home and has had the catheter out now for 1 week.  He is making urine on his own however he is no longer able to control it.  He reports only trace hematuria however he continues to have episodes of dyspnea.  The patient has underlying lung damage from [place] in 2021.  At that point he was on chronic oxygen due to hypoxia.  Patient states that he is experiencing paroxysmal nocturnal dyspnea.  He also reports orthopnea.  I suspect that the patient likely has pulmonary edema.  Patient has Lasix 40 mg tablets at home.  He tried taking  them once and experienced significant cramping and aggressive diuresis.  Therefore we will have him take 20 mg a day for the next 3 to 4 days.  I suspect that he has pulmonary edema based on the rails and appreciating on exam.  I am also going to try to get the patient qualified for oxygen 2 L via nasal cannula at night as needed nocturnal hypoxia.  We will schedule the patient for an overnight pulse oximetry to confirm

## 2022-06-28 NOTE — Addendum Note (Signed)
Addended by: Jenna Luo T on: 06/28/2022 03:03 PM   Modules accepted: Orders

## 2022-06-29 LAB — CBC WITH DIFFERENTIAL/PLATELET
Absolute Monocytes: 651 cells/uL (ref 200–950)
Basophils Absolute: 81 cells/uL (ref 0–200)
Basophils Relative: 1.1 %
Eosinophils Absolute: 244 cells/uL (ref 15–500)
Eosinophils Relative: 3.3 %
HCT: 34.5 % — ABNORMAL LOW (ref 38.5–50.0)
Hemoglobin: 11 g/dL — ABNORMAL LOW (ref 13.2–17.1)
Lymphs Abs: 1147 cells/uL (ref 850–3900)
MCH: 27.6 pg (ref 27.0–33.0)
MCHC: 31.9 g/dL — ABNORMAL LOW (ref 32.0–36.0)
MCV: 86.7 fL (ref 80.0–100.0)
MPV: 10.5 fL (ref 7.5–12.5)
Monocytes Relative: 8.8 %
Neutro Abs: 5276 cells/uL (ref 1500–7800)
Neutrophils Relative %: 71.3 %
Platelets: 295 10*3/uL (ref 140–400)
RBC: 3.98 10*6/uL — ABNORMAL LOW (ref 4.20–5.80)
RDW: 15.6 % — ABNORMAL HIGH (ref 11.0–15.0)
Total Lymphocyte: 15.5 %
WBC: 7.4 10*3/uL (ref 3.8–10.8)

## 2022-06-29 LAB — BASIC METABOLIC PANEL WITH GFR
BUN/Creatinine Ratio: 9 (calc) (ref 6–22)
BUN: 16 mg/dL (ref 7–25)
CO2: 22 mmol/L (ref 20–32)
Calcium: 9.5 mg/dL (ref 8.6–10.3)
Chloride: 105 mmol/L (ref 98–110)
Creat: 1.81 mg/dL — ABNORMAL HIGH (ref 0.70–1.28)
Glucose, Bld: 102 mg/dL — ABNORMAL HIGH (ref 65–99)
Potassium: 4.4 mmol/L (ref 3.5–5.3)
Sodium: 139 mmol/L (ref 135–146)
eGFR: 39 mL/min/{1.73_m2} — ABNORMAL LOW (ref >=60)

## 2022-06-29 LAB — BRAIN NATRIURETIC PEPTIDE: Brain Natriuretic Peptide: 25 pg/mL (ref <100)

## 2022-06-30 ENCOUNTER — Other Ambulatory Visit: Payer: Self-pay

## 2022-06-30 ENCOUNTER — Other Ambulatory Visit: Payer: Self-pay | Admitting: Family Medicine

## 2022-06-30 DIAGNOSIS — J811 Chronic pulmonary edema: Secondary | ICD-10-CM | POA: Insufficient documentation

## 2022-06-30 MED ORDER — AMOXICILLIN-POT CLAVULANATE 875-125 MG PO TABS: 1.0000 | ORAL_TABLET | Freq: Two times a day (BID) | ORAL | 0 refills | Status: DC

## 2022-07-04 DIAGNOSIS — R0600 Dyspnea, unspecified: Secondary | ICD-10-CM | POA: Diagnosis not present

## 2022-07-06 DIAGNOSIS — N401 Enlarged prostate with lower urinary tract symptoms: Secondary | ICD-10-CM | POA: Diagnosis not present

## 2022-07-06 DIAGNOSIS — R338 Other retention of urine: Secondary | ICD-10-CM | POA: Diagnosis not present

## 2022-07-06 DIAGNOSIS — M25551 Pain in right hip: Secondary | ICD-10-CM | POA: Diagnosis not present

## 2022-07-06 DIAGNOSIS — Z466 Encounter for fitting and adjustment of urinary device: Secondary | ICD-10-CM | POA: Diagnosis not present

## 2022-07-06 DIAGNOSIS — R339 Retention of urine, unspecified: Secondary | ICD-10-CM | POA: Diagnosis not present

## 2022-07-07 ENCOUNTER — Ambulatory Visit (INDEPENDENT_AMBULATORY_CARE_PROVIDER_SITE_OTHER): Payer: Medicare Other | Admitting: Family Medicine

## 2022-07-07 VITALS — BP 120/78 | HR 71 | Temp 98.4°F | Ht 66.0 in | Wt 189.4 lb

## 2022-07-07 DIAGNOSIS — R0600 Dyspnea, unspecified: Secondary | ICD-10-CM

## 2022-07-07 DIAGNOSIS — I10 Essential (primary) hypertension: Secondary | ICD-10-CM

## 2022-07-07 DIAGNOSIS — J811 Chronic pulmonary edema: Secondary | ICD-10-CM

## 2022-07-07 NOTE — Addendum Note (Signed)
Addended by: Randal Buba K on: 07/07/2022 02:05 PM   Modules accepted: Orders

## 2022-07-07 NOTE — Progress Notes (Signed)
Subjective:    Patient ID: Mike Davila, male    DOB: January 14, 1946, 76 y.o.   MRN: [id number]  HPI 06/28/22 Dyspnea, unspecified type - Plan: CBC with Differential/Platelet, Brain natriuretic peptide, BASIC METABOLIC PANEL WITH GFR, DG Chest 2 View Patient experienced acute urinary retention secondary to prostatitis.  He underwent 3 separate catheterizations on the third catheterization experienced gross hematuria secondary to a ruptured a blood vessel in his bladder.  He underwent cystoscopy and surgery to stop the acute bleed.  Patient has been home and has had the catheter out now for 1 week.  He is making urine on his own however he is no longer able to control it.  He reports only trace hematuria however he continues to have episodes of dyspnea.  The patient has underlying lung damage from [place] in 2021.  At that point he was on chronic oxygen due to hypoxia.  Patient states that he is experiencing paroxysmal nocturnal dyspnea.  He also reports orthopnea.  I suspect that the patient likely has pulmonary edema.  Patient has Lasix 40 mg tablets at home.  He tried taking them once and experienced significant cramping and aggressive diuresis.  Therefore we will have him take 20 mg a day for the next 3 to 4 days.  I suspect that he has pulmonary edema based on the rails and appreciating on exam.  I am also going to try to get the patient qualified for oxygen 2 L via nasal cannula at night as needed nocturnal hypoxia.  We will schedule the patient for an overnight pulse oximetry to confirm  07/07/22 Patient states that his breathing is better.  Chest x-ray showed bibasilar opacities concerning for atelectasis versus pneumonia.  I started him on Augmentin and held his Lasix.  He states that his breathing is much better today.  Overnight pulse oximetry did show hypoxia.  He is being scheduled for oxygen 2 L via nasal cannula at night.  He denies any chest pain.  He denies any orthopnea.  The bibasilar  crackles have subsided.  However he stopped his valsartan.  He stopped his metoprolol.  He stopped his amlodipine.  He is no longer taking Lasix. Past Medical History:  Diagnosis Date   Anemia    Benign localized prostatic hyperplasia with lower urinary tract symptoms (LUTS)    CKD (chronic kidney disease), stage III [facility])    nephrologist-  dr Hinda Lenis ([facility] in [place])-- per pt last visit 06/ 2019   Diverticulosis of colon    ED (erectile dysfunction)    History of acute gouty arthritis 10/23/2017   History of acute renal failure 10/23/2017   hospital admission-- w/ acute tubular necrosis superimposed on ckd 3--- resolved   History of diverticulitis of colon 10/05/2017   admission-- mild-- resolved w/ medical management   History of metabolic acidosis 40/98/1191   admission-- resolved   History of palpitations    approx. 2009, per pt no issue since   History of urinary retention 10/2017   Hypertension    Incomplete right bundle branch block    Mixed hyperlipidemia    Nephrolithiasis    left side nonobstructive   Pre-diabetes    Rheumatoid arthritis ([place])    Right ureteral stone    Wears dentures    upper    Past Surgical History:  Procedure Laterality Date   ANKLE SURGERY Bilateral 1990s to early 2000s   removal bone spurs   APPENDECTOMY  child   COLONOSCOPY N/A 10/27/2017  Procedure: COLONOSCOPY;  Surgeon: Rogene Houston, MD;  [facility];  Service: Endoscopy;  Laterality: N/A;   CYSTOSCOPY/URETEROSCOPY/HOLMIUM LASER/STENT PLACEMENT Right 05/09/2018   Procedure: CYSTOSCOPY RIGHT URETEROSCOPY/HOLMIUM LASER/STENT PLACEMENT;  Surgeon: Ardis Hughs, MD;  Location: [facility];  Service: Urology;  Laterality: Right;   HEMATOMA EVACUATION  1990s   right calf   INGUINAL HERNIA REPAIR Bilateral age 57s   PLANTAR FASCIA SURGERY Left 2004   TONSILLECTOMY  child   TRANSTHORACIC ECHOCARDIOGRAM  10/24/2017   ef 60-65%,  grade 1 diastolic  dysfunction   Current Outpatient Medications on File Prior to Visit  Medication Sig Dispense Refill   amLODipine (NORVASC) 10 MG tablet Take 1 tablet (10 mg total) by mouth daily. 90 tablet 3   amoxicillin-clavulanate (AUGMENTIN) 875-125 MG tablet Take 1 tablet by mouth 2 (two) times daily. 20 tablet 0   ascorbic acid (VITAMIN C) 500 MG tablet Take 1 tablet (500 mg total) by mouth daily. 30 tablet 1   Blood Glucose Monitoring Suppl (ACCU-CHEK GUIDE) w/Device KIT 1 Units by Does not apply route in the morning and at bedtime. 1 kit 0   furosemide (LASIX) 40 MG tablet TAKE (1) TABLET BY MOUTH TWICE DAILY. 180 tablet 0   glucose blood (ACCU-CHEK GUIDE) test strip Check bs bid DX: R73.03 100 each 3   Lancets Misc. (ACCU-CHEK SOFTCLIX LANCET DEV) KIT Check bs bid DX: R73.03 1 kit 0   metoprolol succinate (TOPROL-XL) 25 MG 24 hr tablet Take 1 tablet (25 mg total) by mouth daily. 90 tablet 0   Multiple Vitamin (MULTIVITAMIN WITH MINERALS) TABS tablet Take 1 tablet by mouth daily. 30 tablet 1   valsartan (DIOVAN) 160 MG tablet TAKE ONE TABLET BY MOUTH ONCE DAILY. 90 tablet 1   Accu-Chek Softclix Lancets lancets Check bs bid DX: R73.03 (Patient not taking: Reported on 07/07/2022) 100 each 12   albuterol (VENTOLIN HFA) 108 (90 Base) MCG/ACT inhaler Inhale 2 puffs into the lungs 3 (three) times daily. (Patient not taking: Reported on 07/07/2022) 8 g 1   alfuzosin (UROXATRAL) 10 MG 24 hr tablet TAKE 1 TABLET BY MOUTH ONCE A DAY. (Patient not taking: Reported on 07/07/2022) 90 tablet 0   cephALEXin (KEFLEX) 500 MG capsule Take 1 capsule (500 mg total) by mouth 3 (three) times daily. (Patient not taking: Reported on 07/07/2022) 21 capsule 0   OXYGEN Inhale 3 L into the lungs. Continous (Patient not taking: Reported on 07/07/2022)     potassium chloride SA (KLOR-CON) 20 MEQ tablet Take 1 tablet (20 mEq total) by mouth daily. (Patient not taking: Reported on 07/07/2022) 90 tablet 3   No current facility-administered  medications on file prior to visit.   Allergies  Allergen Reactions   Sulfa Antibiotics Hives and Shortness Of Breath   Crestor [Rosuvastatin Calcium] Other (See Comments)    Leg cramps   Lipitor [Atorvastatin Calcium] Other (See Comments)    Leg cramps   Tetanus Toxoids Other (See Comments)    Unknown reaction   Social History   Socioeconomic History   Marital status: Married    Spouse name: Not on file   Number of children: Not on file   Years of education: Not on file   Highest education level: Not on file  Occupational History   Not on file  Tobacco Use   Smoking status: Former    Years: 20.00    Types: Cigarettes    Quit date: 05/04/2006    Years since  quitting: 16.1   Smokeless tobacco: Former    Types: Chew    Quit date: 05/04/2017  Vaping Use   Vaping Use: Never used  Substance and Sexual Activity   Alcohol use: No   Drug use: No   Sexual activity: Yes  Other Topics Concern   Not on file  Social History Narrative   Married.   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   Social Determinants of Health   Financial Resource Strain: Low Risk  (08/13/2021)   Overall Financial Resource Strain (CARDIA)    Difficulty of Paying Living Expenses: Not hard at all  Food Insecurity: No Food Insecurity (01/03/2022)   Hunger Vital Sign    Worried About Running Out of Food in the Last Year: Never true    Ran Out of Food in the Last Year: Never true  Transportation Needs: No Transportation Needs (01/03/2022)   PRAPARE - Hydrologist (Medical): No    Lack of Transportation (Non-Medical): No  Physical Activity: Sufficiently Active (08/13/2021)   Exercise Vital Sign    Days of Exercise per Week: 5 days    Minutes of Exercise per Session: 30 min  Stress: No Stress Concern Present (08/13/2021)   [place]    Feeling of Stress : Not at all  Social Connections: Unknown (08/13/2021)   Social Connection and  Isolation Panel [NHANES]    Frequency of Communication with Friends and Family: More than three times a week    Frequency of Social Gatherings with Friends and Family: More than three times a week    Attends Religious Services: Not on file    Active Member of Clubs or Organizations: Yes    Attends Archivist Meetings: More than 4 times per year    Marital Status: Married  Human resources officer Violence: Not At Risk (08/13/2021)   Humiliation, Afraid, Rape, and Kick questionnaire    Fear of Current or Ex-Partner: No    Emotionally Abused: No    Physically Abused: No    Sexually Abused: No   Family History  Problem Relation Age of Onset   Thyroid cancer Mother    Heart disease Father        MI   Colon cancer Father    Melanoma Father    Benign prostatic hyperplasia Brother    Diabetes Mellitus II Maternal Grandmother    Ovarian cancer Daughter     Review of Systems     Objective:   Physical Exam Vitals reviewed.  Constitutional:      General: He is not in acute distress.    Appearance: Normal appearance. He is normal weight. He is not ill-appearing or toxic-appearing.  Cardiovascular:     Rate and Rhythm: Normal rate and regular rhythm.     Heart sounds: Normal heart sounds. No murmur heard.    No gallop.  Pulmonary:     Effort: Pulmonary effort is normal. No respiratory distress.     Breath sounds: Normal breath sounds. No stridor. No wheezing, rhonchi or rales.  Skin:    Findings: No rash.  Neurological:     General: No focal deficit present.     Mental Status: He is alert and oriented to person, place, and time. Mental status is at baseline.     Cranial Nerves: No cranial nerve deficit.        Assessment & Plan:  Dyspnea, unspecified type  Primary hypertension Lungs sound much clearer today and dyspnea  has resolved.  Resume valsartan for blood pressure given his chronic kidney disease.  Continue to hold metoprolol and amlodipine as his blood pressure is  okay despite not taking medication.  Use Lasix 40 mg once daily as needed leg swelling or weight gain greater than 3 pounds.  Complete antibiotics.  Follow-up in 4 months to monitor lab work

## 2022-08-01 ENCOUNTER — Telehealth: Payer: Self-pay | Admitting: Pharmacist

## 2022-08-01 ENCOUNTER — Other Ambulatory Visit: Payer: Self-pay | Admitting: *Deleted

## 2022-08-01 NOTE — Patient Outreach (Signed)
  Care Coordination   08/01/2022  Name: Mike Davila MRN: [id number] DOB: July 21, 1946   Care Coordination Outreach Attempts:  A second unsuccessful outreach was attempted today to offer the patient with information about available care coordination services as a benefit of their health plan.   HIPAA compliant message left on voicemail, providing contact information for CSW, encouraging patient to return CSW's call at his earliest convenience.   Follow Up Plan:  Additional outreach attempts will be made to offer the patient care coordination information and services.    Encounter Outcome:  No Answer.    Care Coordination Interventions Activated:  No.     Care Coordination Interventions:  No, not indicated.     Nat Christen, BSW, MSW, LCSW  Licensed Education officer, [facility]  [address] [place] [address] [place], [place] [address] Physical Address-[address], [place], [place] [address] Toll Free Main # [phone] Fax # [phone] Cell # [phone] [email or web address]

## 2022-08-01 NOTE — Progress Notes (Signed)
Chronic Care Management Pharmacy Assistant   Name: TELFORD ARCHAMBEAU  MRN: [id number] DOB: 1946-01-21   Reason for Encounter: Disease State - Hypertension Call     Recent office visits:  07/07/22 Jenna Luo, MD - Family Medicine - Dyspnea - DME for Oxygen ordered. Continue to hold metoprolol and amlodipine as his blood pressure is okay despite not taking medication.  Use Lasix 40 mg once daily as needed leg swelling or weight gain greater than 3 pounds.  Complete antibiotics.  Follow-up in 4 months to monitor lab work.  06/28/22 Jenna Luo, MD - Family Medicine - Dyspnea - Labs were ordered. CXR ordered. Follow up as scheduled.   04/21/22 Jenna Luo, MD - Family Medicine - Hypertension - amLODipine (NORVASC) 10 MG tablet prescribed. Discontinue HCTZ and begin Amlodipine. May also use Furosemide as needed for leg swelling. Follow up in 2 weeks.   Recent consult visits:  07/06/22 Lucia Bitter - Urology - Urinary retention - perform kegel exercises; hold for 10sec x10 reps x5sets per day. Follow up in 3 months for PVR with IPSS.  06/22/22 Lucia Bitter - Urology - Urinary retention - Foley removed in office. Discontinue Dutasteride and Alfuzosin  Follow up in 2 weeks.  06/02/22 Ihor Dow, MD - Urology - Urinary Retention - Follow up in 2 weeks for cystoscopy and transrectal ultrasound volume study for further evaluation and to determine whether he might be a candidate for minimally invasive procedure such as UroLift.   Hospital visits: 06/11/22 Medication Reconciliation was completed by comparing discharge summary, patient's EMR and Pharmacy list, and upon discussion with patient.  Admitted to the hospital on 06/11/22 due to Hematuria. Discharge date was 06/11/22. Discharged from [place]?Medications Started at [facility] Discharge:?? No records available  Medication Changes at Hospital Discharge: No records available  Medications Discontinued at  Hospital Discharge: No records available  Medications that remain the same after Hospital Discharge:??  All other medications will remain the same.    Hospital visits: 05/29/22 Medication Reconciliation was completed by comparing discharge summary, patient's EMR and Pharmacy list, and upon discussion with patient.  Admitted to the hospital on 05/29/22 due to Urinary retention. Discharge date was 05/29/22. Discharged from [place]?Medications Started at [facility] Discharge:?? None noted.   /Medication Changes at [facility] Discharge: None noted.   Medications Discontinued at Hospital Discharge: None noted.   Medications that remain the same after Hospital Discharge:??  All other medications will remain the same.    Hospital visits: 04/20/22 - 04/21/22 Medication Reconciliation was completed by comparing discharge summary, patient's EMR and Pharmacy list, and upon discussion with patient.  Admitted to the hospital on 04/20/22 due to Hypertension . Discharge date was 04/21/22. Discharged from [place]?Medications Started at [facility] Discharge:?? hydrochlorothiazide (HYDRODIURIL) 12.5 MG tablet  Medication Changes at Hospital Discharge: None noted.   Medications Discontinued at Hospital Discharge: None noted  Medications that remain the same after Hospital Discharge:??  All other medications will remain the same.    Medications: Outpatient Encounter Medications as of 08/01/2022  Medication Sig   Accu-Chek Softclix Lancets lancets Check bs bid DX: R73.03 (Patient not taking: Reported on 07/07/2022)   albuterol (VENTOLIN HFA) 108 (90 Base) MCG/ACT inhaler Inhale 2 puffs into the lungs 3 (three) times daily. (Patient not taking: Reported on 07/07/2022)   amoxicillin-clavulanate (AUGMENTIN) 875-125 MG tablet Take 1 tablet by mouth 2 (two) times daily.  ascorbic acid (VITAMIN C) 500 MG tablet Take 1 tablet (500 mg total) by mouth daily.   Blood Glucose  Monitoring Suppl (ACCU-CHEK GUIDE) w/Device KIT 1 Units by Does not apply route in the morning and at bedtime.   furosemide (LASIX) 40 MG tablet TAKE (1) TABLET BY MOUTH TWICE DAILY. (Patient taking differently: Take 40 mg by mouth daily as needed.)   glucose blood (ACCU-CHEK GUIDE) test strip Check bs bid DX: R73.03   Lancets Misc. (ACCU-CHEK SOFTCLIX LANCET DEV) KIT Check bs bid DX: R73.03   Multiple Vitamin (MULTIVITAMIN WITH MINERALS) TABS tablet Take 1 tablet by mouth daily.   OXYGEN Inhale 3 L into the lungs. Continous (Patient not taking: Reported on 07/07/2022)   potassium chloride SA (KLOR-CON) 20 MEQ tablet Take 1 tablet (20 mEq total) by mouth daily. (Patient not taking: Reported on 07/07/2022)   valsartan (DIOVAN) 160 MG tablet TAKE ONE TABLET BY MOUTH ONCE DAILY.   No facility-administered encounter medications on file as of 08/01/2022.    Current antihypertensive regimen:  Furosemide (LASIX) 40 MG tablet (as needed) Valsartan (DIOVAN) 160 MG tablet  Amlodipine 16m daily (?)  How often are you checking your Blood Pressure?  Patient reported checking blood pressures  Current home BP readings:     What recent interventions/DTPs have been made by any provider to improve Blood Pressure control since last CPP Visit:     Any recent hospitalizations or ED visits since last visit with CPP? Patient had several  ED visits since last visit with CPP as mentioned above.    What diet changes have been made to improve Blood Pressure Control?  Patient reported   What exercise is being done to improve your Blood Pressure Control?   Patient reported   Adherence Review: Is the patient currently on ACE/ARB medication? Yes Does the patient have >5 day gap between last estimated fill dates? Yes    Care Gaps   AWV: done 08/13/21 (scheduled 08/17/22) Colonoscopy: done 10/27/17 DM Eye Exam: N/A DM Foot Exam: N/A Microalbumin: done 01/04/21 HbgAIC:  done 06/13/22 (5.4) DEXA:   N/A Mammogram: N/A   Star Rating Drugs Valsartan (DIOVAN) 160 MG tablet - last filled 05/26/22 90 days     Future Appointments  Date Time Provider [place] 08/17/2022  3:30 PM [facility]-NURSE HEALTH ADVISOR [facility]   Multiple attempts were made to contact patient. Attempts were unsuccessful. / ls,CMA    LJobe Gibbon CWapelloPharmacist Assistant  [phone]

## 2022-08-04 ENCOUNTER — Other Ambulatory Visit: Payer: Self-pay | Admitting: Family Medicine

## 2022-08-04 ENCOUNTER — Telehealth: Payer: Self-pay

## 2022-08-04 MED ORDER — TRAZODONE HCL 50 MG PO TABS: 50.0000 mg | ORAL_TABLET | Freq: Every day | ORAL | 1 refills | Status: DC

## 2022-08-04 NOTE — Telephone Encounter (Signed)
Pt called with c/o not sleeping well. Pt states he can fall asleep but does not stay asleep. Pt asks if a medication can be recommended to help? Thanks.

## 2022-08-05 ENCOUNTER — Telehealth: Payer: Self-pay | Admitting: *Deleted

## 2022-08-05 ENCOUNTER — Other Ambulatory Visit: Payer: Self-pay

## 2022-08-05 DIAGNOSIS — J9601 Acute respiratory failure with hypoxia: Secondary | ICD-10-CM

## 2022-08-05 DIAGNOSIS — R0600 Dyspnea, unspecified: Secondary | ICD-10-CM

## 2022-08-05 DIAGNOSIS — J811 Chronic pulmonary edema: Secondary | ICD-10-CM

## 2022-08-05 NOTE — Patient Outreach (Signed)
  Care Coordination   Initial Visit Note   08/05/2022 Name: Mike Davila MRN: [id number] DOB: 1945/11/10  Mike Davila is a 76 y.o. year old male who sees Pickard, Cammie Mcgee, MD for primary care. I spoke with  Mike Davila by phone today.  What matters to the patients health and wellness today?  Mike Davila states he does not need the service.    Goals Addressed   None     SDOH assessments and interventions completed:  No     Care Coordination Interventions Activated:  No  Care Coordination Interventions:  No, not indicated   Follow up plan: No further intervention required.   Encounter Outcome:  Pt. Refused   Mike Davila C. Myrtie Neither, MSN, [facility] Gerontological Nurse Practitioner [facility] Care Management [phone]

## 2022-08-08 ENCOUNTER — Other Ambulatory Visit: Payer: Self-pay

## 2022-08-08 ENCOUNTER — Telehealth: Payer: Self-pay | Admitting: Family Medicine

## 2022-08-08 DIAGNOSIS — R0600 Dyspnea, unspecified: Secondary | ICD-10-CM

## 2022-08-08 DIAGNOSIS — J9601 Acute respiratory failure with hypoxia: Secondary | ICD-10-CM

## 2022-08-08 DIAGNOSIS — U071 COVID-19: Secondary | ICD-10-CM

## 2022-08-08 DIAGNOSIS — J811 Chronic pulmonary edema: Secondary | ICD-10-CM

## 2022-08-08 NOTE — Telephone Encounter (Signed)
Patient called to follow up on pulse oximetry machine; stated it's getting very difficult to breathe.   New order needed by [facility] to give patient another pulse oximetry machine. Patient requesting for order to be resent today.  Also received voicemail message from [place] with [employer] who stated order has already expired. Please advise Caryl Pina at [phone]  Please advise patient also at [phone].

## 2022-08-15 ENCOUNTER — Ambulatory Visit (INDEPENDENT_AMBULATORY_CARE_PROVIDER_SITE_OTHER): Payer: Medicare Other | Admitting: Family Medicine

## 2022-08-15 VITALS — BP 138/90 | HR 73 | Temp 97.5°F | Ht 66.0 in | Wt 196.0 lb

## 2022-08-15 DIAGNOSIS — I10 Essential (primary) hypertension: Secondary | ICD-10-CM

## 2022-08-15 DIAGNOSIS — G479 Sleep disorder, unspecified: Secondary | ICD-10-CM

## 2022-08-15 MED ORDER — ZOLPIDEM TARTRATE 5 MG PO TABS: 5.0000 mg | ORAL_TABLET | Freq: Every evening | ORAL | 1 refills | Status: DC | PRN

## 2022-08-15 NOTE — Progress Notes (Signed)
Acute Office Visit  Subjective:     Patient ID: Mike Davila, male    DOB: September 23, 1946, 76 y.o.   MRN: [id number]  Chief Complaint  Patient presents with   Follow-up    running high bp for about 10 days    HPI Patient is in today for uncontrolled BP and insomnia. He reports elevated BP readings at home for the past 10 days ranging from 259-563 systolic and >875 diastolic. He is currently taking Valsartan '160mg'$  daily and Metoprolol '50mg'$  every morning and self initiated his Amlodipine '10mg'$  daily. He stopped his Amlodipine many months ago and resumed in the past 6 days. Symptoms include headache. Denies chest pain, dyspnea, palpitations, lower extremity swelling, or vision changes. His diet is unchanged. He checks at home several times a day at 730 am prior to medications.   He also reports difficulty staying asleep. Has tried Trazodone 50-'100mg'$  and Melatonin without relief. He denies symptoms of restless leg, endorses some snoring and headaches. He reports going to bed at the same time every night, avoiding caffeine, he does watch some television before bed.  Review of Systems  Constitutional: Negative.   Eyes:  Negative for blurred vision and double vision.  Respiratory:  Negative for shortness of breath.   Cardiovascular:  Negative for chest pain, palpitations and leg swelling.  Musculoskeletal: Negative.   Neurological:  Positive for headaches.  Psychiatric/Behavioral: Negative.          Objective:    BP (!) 138/90 (BP Location: Left Arm, Patient Position: Sitting) Comment (Cuff Size): manual  Pulse 73   Temp (!) 97.5 F (36.4 C) (Oral)   Ht '5\' 6"'$  (1.676 m)   Wt 196 lb (88.9 kg)   SpO2 96%   BMI 31.64 kg/m  BP Readings from Last 3 Encounters:  08/15/22 (!) 138/90  07/07/22 120/78  06/28/22 118/78      Physical Exam Vitals and nursing note reviewed.  Constitutional:      Appearance: Normal appearance. He is obese.  Neck:     Vascular: No carotid bruit.   Cardiovascular:     Rate and Rhythm: Normal rate and regular rhythm.     Pulses: Normal pulses.     Heart sounds: Normal heart sounds.  Pulmonary:     Effort: Pulmonary effort is normal.     Breath sounds: Normal breath sounds.  Musculoskeletal:     Right lower leg: No edema.     Left lower leg: No edema.  Neurological:     General: No focal deficit present.     Mental Status: He is alert and oriented to person, place, and time. Mental status is at baseline.  Psychiatric:        Mood and Affect: Mood normal.        Behavior: Behavior normal.        Thought Content: Thought content normal.        Judgment: Judgment normal.     No results found for any visits on 08/15/22.      Assessment & Plan:   1. Primary hypertension Continue Valsartan '160mg'$  daily, Metop '50mg'$  daily, and Amlodipine '10mg'$  daily and check home BP readings for 3 days and report to office. He has been self-titrating his home medications so we discussed the importance of taking the prescribed doses. Home BP cuff correlated in office today. Discussed the importance of limiting salt intake as well as when to seek emergency care. - metoprolol succinate (TOPROL-XL) 50 MG 24 hr  tablet; Take 50 mg by mouth daily. Take with or immediately following a meal. - amLODipine (NORVASC) 10 MG tablet; Take 10 mg by mouth 2 (two) times daily.  2. Sleep disturbance Stop Trazodone and start Ambien '5mg'$  nightly. Avoid caffeine and screen time before med, initiate relaxation techniques and sleep diary. Will refer for sleep apnea testing. - zolpidem (AMBIEN) 5 MG tablet; Take 1 tablet (5 mg total) by mouth at bedtime as needed for sleep.  Dispense: 15 tablet; Refill: 1 - Ambulatory referral to Sleep Studies   Return in about 4 weeks (around 09/12/2022) for sleep disturbance.  Rubie Maid, FNP

## 2022-08-17 ENCOUNTER — Ambulatory Visit (INDEPENDENT_AMBULATORY_CARE_PROVIDER_SITE_OTHER): Payer: Medicare Other

## 2022-08-17 VITALS — BP 118/72 | HR 83 | Ht 66.0 in | Wt 192.0 lb

## 2022-08-17 DIAGNOSIS — Z0000 Encounter for general adult medical examination without abnormal findings: Secondary | ICD-10-CM

## 2022-08-17 NOTE — Progress Notes (Signed)
Subjective:   Mike Davila is a 76 y.o. male who presents for Medicare Annual/Subsequent preventive examination.  Review of Systems     Cardiac Risk Factors include: advanced age (>57mn, >>74women);dyslipidemia;hypertension;male gender;obesity (BMI >30kg/m2);sedentary lifestyle     Objective:    Today's Vitals   08/17/22 1510  BP: 118/72  Pulse: 83  SpO2: 99%  Weight: 192 lb (87.1 kg)  Height: 5' 6"  (1.676 m)   Body mass index is 30.99 kg/m.     08/17/2022    3:18 PM 05/29/2022    9:40 AM 04/20/2022    2:02 PM 01/03/2022   10:07 AM 08/13/2021   11:54 AM 05/02/2021   11:39 PM 04/21/2021   11:52 AM  Advanced Directives  Does Patient Have a Medical Advance Directive? No No No No No No No  Would patient like information on creating a medical advance directive? No - Patient declined No - Patient declined No - Patient declined No - Patient declined No - Patient declined No - Patient declined No - Patient declined    Current Medications (verified) Outpatient Encounter Medications as of 08/17/2022  Medication Sig   Accu-Chek Softclix Lancets lancets Check bs bid DX: R73.03   albuterol (VENTOLIN HFA) 108 (90 Base) MCG/ACT inhaler Inhale 2 puffs into the lungs 3 (three) times daily.   amLODipine (NORVASC) 10 MG tablet Take 10 mg by mouth daily.   ascorbic acid (VITAMIN C) 500 MG tablet Take 1 tablet (500 mg total) by mouth daily.   Blood Glucose Monitoring Suppl (ACCU-CHEK GUIDE) w/Device KIT 1 Units by Does not apply route in the morning and at bedtime.   glucose blood (ACCU-CHEK GUIDE) test strip Check bs bid DX: R73.03   Lancets Misc. (ACCU-CHEK SOFTCLIX LANCET DEV) KIT Check bs bid DX: R73.03   metoprolol succinate (TOPROL-XL) 50 MG 24 hr tablet Take 50 mg by mouth daily. Take with or immediately following a meal.   Multiple Vitamin (MULTIVITAMIN WITH MINERALS) TABS tablet Take 1 tablet by mouth daily.   OXYGEN Inhale 3 L into the lungs. Continous   valsartan (DIOVAN)  160 MG tablet TAKE ONE TABLET BY MOUTH ONCE DAILY.   zolpidem (AMBIEN) 5 MG tablet Take 1 tablet (5 mg total) by mouth at bedtime as needed for sleep.   [DISCONTINUED] amoxicillin-clavulanate (AUGMENTIN) 875-125 MG tablet Take 1 tablet by mouth 2 (two) times daily. (Patient not taking: Reported on 08/15/2022)   [DISCONTINUED] furosemide (LASIX) 40 MG tablet TAKE (1) TABLET BY MOUTH TWICE DAILY. (Patient not taking: Reported on 08/15/2022)   [DISCONTINUED] potassium chloride SA (KLOR-CON) 20 MEQ tablet Take 1 tablet (20 mEq total) by mouth daily. (Patient not taking: Reported on 08/15/2022)   No facility-administered encounter medications on file as of 08/17/2022.    Allergies (verified) Sulfa antibiotics, Crestor [rosuvastatin calcium], Lipitor [atorvastatin calcium], and Tetanus toxoids   History: Past Medical History:  Diagnosis Date   Anemia    Benign localized prostatic hyperplasia with lower urinary tract symptoms (LUTS)    CKD (chronic kidney disease), stage III ([facility]    nephrologist-  [place]Hinda [place]is[place] [place]-- per [place] last visit 06/ 2019   Diverticulosis of colon    ED (erectile dysfunction)    History of acute gouty arthritis 10/23/2017   History of acute renal failure 10/23/2017   hospital admission-- w/ acute tubular necrosis superimposed on ckd 3--- resolved   History of diverticulitis of colon 10/05/2017   admission-- mild-- resolved w/ medical management   History  of metabolic acidosis 63/81/7711   admission-- resolved   History of palpitations    approx. 2009, per pt no issue since   History of urinary retention 10/2017   Hypertension    Incomplete right bundle branch block    Mixed hyperlipidemia    Nephrolithiasis    left side nonobstructive   Pre-diabetes    Rheumatoid arthritis ([place])    Right ureteral stone    Wears dentures    upper   Past Surgical History:  Procedure Laterality Date   ANKLE SURGERY Bilateral 1990s to early 2000s   removal  bone spurs   APPENDECTOMY  child   COLONOSCOPY N/A 10/27/2017   Procedure: COLONOSCOPY;  Surgeon: Rogene Houston, MD;  [facility];  Service: Endoscopy;  Laterality: N/A;   CYSTOSCOPY/URETEROSCOPY/HOLMIUM LASER/STENT PLACEMENT Right 05/09/2018   Procedure: CYSTOSCOPY RIGHT URETEROSCOPY/HOLMIUM LASER/STENT PLACEMENT;  Surgeon: Ardis Hughs, MD;  Location: [facility];  Service: Urology;  Laterality: Right;   HEMATOMA EVACUATION  1990s   right calf   INGUINAL HERNIA REPAIR Bilateral age 65s   PLANTAR FASCIA SURGERY Left 2004   TONSILLECTOMY  child   TRANSTHORACIC ECHOCARDIOGRAM  10/24/2017   ef 60-65%,  grade 1 diastolic dysfunction   Family History  Problem Relation Age of Onset   Thyroid cancer Mother    Heart disease Father        MI   Colon cancer Father    Melanoma Father    Benign prostatic hyperplasia Brother    Diabetes Mellitus II Maternal Grandmother    Ovarian cancer Daughter    Social History   Socioeconomic History   Marital status: Married    Spouse name: Not on file   Number of children: Not on file   Years of education: Not on file   Highest education level: Not on file  Occupational History   Not on file  Tobacco Use   Smoking status: Former    Years: 20.00    Types: Cigarettes    Quit date: 05/04/2006    Years since quitting: 16.2   Smokeless tobacco: Former    Types: Chew    Quit date: 05/04/2017  Vaping Use   Vaping Use: Never used  Substance and Sexual Activity   Alcohol use: No   Drug use: No   Sexual activity: Yes  Other Topics Concern   Not on file  Social History Narrative   Married.   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   Social Determinants of Health   Financial Resource Strain: Low Risk  (08/17/2022)   Overall Financial Resource Strain (CARDIA)    Difficulty of Paying Living Expenses: Not hard at all  Food Insecurity: No Food Insecurity (08/17/2022)   Hunger Vital Sign    Worried About Running Out of Food in the Last  Year: Never true    Ran Out of Food in the Last Year: Never true  Transportation Needs: No Transportation Needs (08/17/2022)   PRAPARE - Hydrologist (Medical): No    Lack of Transportation (Non-Medical): No  Physical Activity: Sufficiently Active (08/17/2022)   Exercise Vital Sign    Days of Exercise per Week: 5 days    Minutes of Exercise per Session: 30 min  Stress: No Stress Concern Present (08/17/2022)   [place]    Feeling of Stress : Not at all  Social Connections: [place] (08/17/2022)   Social Connection and Isolation Panel [NHANES]  Frequency of Communication with Friends and Family: More than three times a week    Frequency of Social Gatherings with Friends and Family: More than three times a week    Attends Religious Services: More than 4 times per year    Active Member of [employer] or Organizations: Yes    Attends Music therapist: More than 4 times per year    Marital Status: Married    Tobacco Counseling Counseling given: Not Answered   Clinical Intake:  Pre-visit preparation completed: Yes  Pain : No/denies pain     BMI - recorded: 30.99 Nutritional Status: BMI > 30  Obese Nutritional Risks: None Diabetes: No  How often do you need to have someone help you when you read instructions, pamphlets, or other written materials from your doctor or pharmacy?: 1 - Never  Diabetic?NO  Interpreter Needed?: No  Information entered by :: mj Placido Hangartner, lpn   Activities of Daily Living    08/17/2022    3:19 PM 08/14/2022    7:56 PM  In your present state of health, do you have any difficulty performing the following activities:  Hearing? 0 0  Vision? 0 0  Difficulty concentrating or making decisions? 0 0  Walking or climbing stairs? 0 0  Dressing or bathing? 0 0  Doing errands, shopping? 0 0  Preparing Food and eating ? N N  Using the  Toilet? N N  In the past six months, have you accidently leaked urine? Y   Comment Since bladder surgery.   Do you have problems with loss of bowel control? N N  Managing your Medications? N N  Managing your Finances? N N  Housekeeping or managing your Housekeeping? N N    Patient Care Team: [name], MD as PCP - General (Family Medicine) Edythe Clarity, [facility] as Pharmacist (Pharmacist)  Indicate any recent Medical Services you may have received from other than [facility] providers in the past year (date may be approximate).     Assessment:   This is a routine wellness examination for Mike Davila.  Hearing/Vision screen No results found.  Dietary issues and exercise activities discussed: Current Exercise Habits: Home exercise routine, Type of exercise: walking, Time (Minutes): 30, Frequency (Times/Week): 5, Weekly Exercise (Minutes/Week): 150, Intensity: Mild, Exercise limited by: cardiac condition(s)   Goals Addressed             This Visit's Progress    Exercise 3x per week (30 min per time)   On track    Pt state he wants to stay healthy.     Manage My Medicine   On track    Timeframe:  Long-Range Goal Priority:  High Start Date: 01/29/21                            Expected End Date:    04/30/21                   Follow Up Date 03/23/21   - call if I am sick and can't take my medicine - keep a list of all the medicines I take; vitamins and herbals too    Why is this important?   These steps will help you keep on track with your medicines.   Notes: Focus on adherence and contact providers with adverse effects.       Depression Screen    08/17/2022    3:16 PM 08/15/2022  8:48 AM 07/07/2022   10:53 AM 01/03/2022   10:08 AM 08/13/2021   11:52 AM 01/04/2021   10:54 AM 10/30/2018    9:25 AM  PHQ 2/9 Scores  PHQ - 2 Score 0 0 0 0 0 0 0  PHQ- 9 Score 0 0         Fall Risk    08/17/2022    3:18 PM 08/15/2022    8:48 AM 07/07/2022   10:53 AM 01/03/2022    10:03 AM 08/13/2021   11:55 AM  Fall Risk   Falls in the past year? 0 0 0 0 0  Number falls in past yr: 0  0  0  Injury with Fall? 0  0  0  Risk for fall due to : No Fall Risks  No Fall Risks  No Fall Risks  Follow up Falls prevention discussed  Falls prevention discussed  Falls prevention discussed    FALL RISK PREVENTION PERTAINING TO THE HOME:  Any stairs in or around the home? Yes  If so, are there any without handrails? No  Home free of loose throw rugs in walkways, pet beds, electrical cords, etc? Yes  Adequate lighting in your home to reduce risk of falls? Yes   ASSISTIVE DEVICES UTILIZED TO PREVENT FALLS:  Life alert? No  Use of a cane, walker or w/c? No  Grab bars in the bathroom? Yes  Shower chair or bench in shower? No  Elevated toilet seat or a handicapped toilet? No   TIMED UP AND GO:  Was the test performed? Yes .  Length of time to ambulate 10 feet: 10 sec.   Gait steady and fast without use of assistive device  Cognitive Function:        08/17/2022    3:20 PM 08/13/2021   11:58 AM  6CIT Screen  What Year? 0 points 0 points  What month? 0 points 0 points  What time? 0 points 0 points  Count back from 20 0 points 0 points  Months in reverse 0 points 0 points  Repeat phrase 0 points 0 points  Total Score 0 points 0 points    Immunizations  There is no immunization history on file for this patient.  TDAP status: Up to date  Flu Vaccine status: Up to date  Pneumococcal vaccine status: Declined,  Education has been provided regarding the importance of this vaccine but patient still declined. Advised may receive this vaccine at local pharmacy or Health Dept. Aware to provide a copy of the vaccination record if obtained from local pharmacy or Health Dept. Verbalized acceptance and understanding.   Covid-19 vaccine status: Declined, Education has been provided regarding the importance of this vaccine but patient still declined. Advised may receive  this vaccine at local pharmacy or Health Dept.or vaccine clinic. Aware to provide a copy of the vaccination record if obtained from local pharmacy or Health Dept. Verbalized acceptance and understanding.  Qualifies for Shingles Vaccine? Yes   Zostavax completed No   Shingrix Completed?: No.    Education has been provided regarding the importance of this vaccine. Patient has been advised to call insurance company to determine out of pocket expense if they have not yet received this vaccine. Advised may also receive vaccine at local pharmacy or Health Dept. Verbalized acceptance and understanding.  Screening Tests Health Maintenance  Topic Date Due   Pneumonia Vaccine 7+ Years old (1 - PCV) 10/19/2022 (Originally 07/08/1952)   COVID-19 Vaccine (1) 10/19/2022 (Originally 01/06/1947)  Zoster Vaccines- Shingrix (1 of 2) 10/20/2022 (Originally 07/08/1997)   INFLUENZA VACCINE  01/22/2023 (Originally 05/24/2022)   TETANUS/TDAP  12/23/2034 (Originally 07/08/1966)   Medicare Annual Wellness (AWV)  09/17/2023   COLONOSCOPY (Pts 45-91yr Insurance coverage will need to be confirmed)  10/28/2027   Hepatitis C Screening  Completed   HPV VACCINES  Aged Out    Health Maintenance  There are no preventive care reminders to display for this patient.   Colorectal cancer screening: Type of screening: Colonoscopy. Completed 10/27/2017. Repeat every 10 years  Lung Cancer Screening: (Low Dose CT Chest recommended if Age 75-80years, 30 pack-year currently smoking OR have quit w/in 15years.) does not qualify.   Lung Cancer Screening Referral: N/A  Additional Screening:  Hepatitis C Screening: does qualify; Completed 10/24/2017  Vision Screening: Recommended annual ophthalmology exams for early detection of glaucoma and other disorders of the eye. Is the patient up to date with their annual eye exam?  No  Who is the provider or what is the name of the office in which the patient attends annual eye exams? N/A If  pt is not established with a provider, would they like to be referred to a provider to establish care? No .   Dental Screening: Recommended annual dental exams for proper oral hygiene  Community Resource Referral / Chronic Care Management: CRR required this visit?  No   CCM required this visit?  No      Plan:     I have personally reviewed and noted the following in the patient's chart:   Medical and social history Use of alcohol, tobacco or illicit drugs  Current medications and supplements including opioid prescriptions. Patient is not currently taking opioid prescriptions. Functional ability and status Nutritional status Physical activity Advanced directives List of other physicians Hospitalizations, surgeries, and ER visits in previous 12 months Vitals Screenings to include cognitive, depression, and falls Referrals and appointments  In addition, I have reviewed and discussed with patient certain preventive protocols, quality metrics, and best practice recommendations. A written personalized care plan for preventive services as well as general preventive health recommendations were provided to patient.     MChriss Driver LPN   [id number]/53/0051  Nurse Notes: Discussed Shingrix and how to obtain.

## 2022-08-18 ENCOUNTER — Encounter: Payer: Self-pay | Admitting: Family Medicine

## 2022-08-18 NOTE — Progress Notes (Signed)
Patient provided home BP readings 10/25 as follows: 10/23 0900 130/82, 10/24 0900 152/110, 10/24 1830 138/96, 10/25 0900 113/90, 10/25 1455 117/85, and 10/25 1524 118/72. Reaching much improved from prior. Continue medications as prescribed

## 2022-09-20 ENCOUNTER — Other Ambulatory Visit: Payer: Self-pay | Admitting: Family Medicine

## 2022-09-20 DIAGNOSIS — G479 Sleep disorder, unspecified: Secondary | ICD-10-CM

## 2022-09-21 ENCOUNTER — Other Ambulatory Visit: Payer: Self-pay | Admitting: Family Medicine

## 2022-09-21 ENCOUNTER — Telehealth: Payer: Self-pay

## 2022-09-21 MED ORDER — ZOLPIDEM TARTRATE 10 MG PO TABS: 10.0000 mg | ORAL_TABLET | Freq: Every evening | ORAL | 1 refills | Status: DC | PRN

## 2022-09-21 NOTE — Telephone Encounter (Signed)
Pt called requesting a refill on his Ambien 5 mg. Last RF was 08/15/2022 with a quantity of 15. Pt uses [facility] Dr. Lujean Rave you.

## 2022-09-26 ENCOUNTER — Other Ambulatory Visit: Payer: Self-pay

## 2022-09-26 ENCOUNTER — Telehealth: Payer: Self-pay

## 2022-09-26 DIAGNOSIS — Z79899 Other long term (current) drug therapy: Secondary | ICD-10-CM | POA: Diagnosis not present

## 2022-09-26 DIAGNOSIS — I10 Essential (primary) hypertension: Secondary | ICD-10-CM

## 2022-09-26 DIAGNOSIS — N189 Chronic kidney disease, unspecified: Secondary | ICD-10-CM | POA: Diagnosis not present

## 2022-09-26 DIAGNOSIS — E782 Mixed hyperlipidemia: Secondary | ICD-10-CM | POA: Diagnosis not present

## 2022-09-26 DIAGNOSIS — M0579 Rheumatoid arthritis with rheumatoid factor of multiple sites without organ or systems involvement: Secondary | ICD-10-CM | POA: Diagnosis not present

## 2022-09-26 DIAGNOSIS — Z8739 Personal history of other diseases of the musculoskeletal system and connective tissue: Secondary | ICD-10-CM | POA: Diagnosis not present

## 2022-09-26 MED ORDER — VALSARTAN 160 MG PO TABS: 160.0000 mg | ORAL_TABLET | Freq: Every day | ORAL | 3 refills | Status: DC

## 2022-09-26 NOTE — Telephone Encounter (Signed)
Pt's wife called in stating that they would like to change pharmacy's and also to have this prescription sent to that pharmacy as pt is out of this med valsartan (DIOVAN) 160 MG tablet [[id number]].   New Pharmacy: [facility] on [address]  Cb#: [phone]

## 2022-10-05 DIAGNOSIS — N401 Enlarged prostate with lower urinary tract symptoms: Secondary | ICD-10-CM | POA: Diagnosis not present

## 2022-10-05 DIAGNOSIS — R338 Other retention of urine: Secondary | ICD-10-CM | POA: Diagnosis not present

## 2022-11-14 ENCOUNTER — Encounter: Payer: Self-pay | Admitting: Family Medicine

## 2022-11-14 ENCOUNTER — Ambulatory Visit (INDEPENDENT_AMBULATORY_CARE_PROVIDER_SITE_OTHER): Payer: Medicare Other | Admitting: Family Medicine

## 2022-11-14 VITALS — BP 124/64 | HR 73 | Temp 97.9°F | Ht 66.0 in | Wt 200.0 lb

## 2022-11-14 DIAGNOSIS — G479 Sleep disorder, unspecified: Secondary | ICD-10-CM | POA: Diagnosis not present

## 2022-11-14 DIAGNOSIS — M059 Rheumatoid arthritis with rheumatoid factor, unspecified: Secondary | ICD-10-CM

## 2022-11-14 DIAGNOSIS — I10 Essential (primary) hypertension: Secondary | ICD-10-CM | POA: Diagnosis not present

## 2022-11-14 MED ORDER — AMLODIPINE BESYLATE 5 MG PO TABS: 5.0000 mg | ORAL_TABLET | Freq: Two times a day (BID) | ORAL | 3 refills | Status: DC

## 2022-11-14 MED ORDER — METOPROLOL SUCCINATE ER 50 MG PO TB24: 50.0000 mg | ORAL_TABLET | Freq: Every day | ORAL | 3 refills | Status: DC

## 2022-11-14 MED ORDER — VALSARTAN 160 MG PO TABS: 160.0000 mg | ORAL_TABLET | Freq: Two times a day (BID) | ORAL | 3 refills | Status: DC

## 2022-11-14 MED ORDER — ZOLPIDEM TARTRATE 5 MG PO TABS: 5.0000 mg | ORAL_TABLET | Freq: Every evening | ORAL | 1 refills | Status: DC | PRN

## 2022-11-14 NOTE — Progress Notes (Signed)
Subjective:    Patient ID: Mike Davila, male    DOB: 1946/03/10, 77 y.o.   MRN: [id number]  HPI Patient is here today to recheck his blood pressure.  He has been taking valsartan 160 mg daily with amlodipine 10 mg twice daily along with metoprolol 50 mg daily.  His blood pressure has been well-controlled in the 130s over 80s but he is having significant swelling.  He also is requesting lab work prior to seeing his rheumatologist to monitor his rheumatoid. Past Medical History:  Diagnosis Date   Anemia    Benign localized prostatic hyperplasia with lower urinary tract symptoms (LUTS)    CKD (chronic kidney disease), stage III [facility])    nephrologist-  dr Hinda Lenis ([facility] in [place])-- per pt last visit 06/ 2019   Diverticulosis of colon    ED (erectile dysfunction)    History of acute gouty arthritis 10/23/2017   History of acute renal failure 10/23/2017   hospital admission-- w/ acute tubular necrosis superimposed on ckd 3--- resolved   History of diverticulitis of colon 10/05/2017   admission-- mild-- resolved w/ medical management   History of metabolic acidosis 52/84/1324   admission-- resolved   History of palpitations    approx. 2009, per pt no issue since   History of urinary retention 10/2017   Hypertension    Incomplete right bundle branch block    Mixed hyperlipidemia    Nephrolithiasis    left side nonobstructive   Pre-diabetes    Rheumatoid arthritis ([place])    Right ureteral stone    Wears dentures    upper    Past Surgical History:  Procedure Laterality Date   ANKLE SURGERY Bilateral 1990s to early 2000s   removal bone spurs   APPENDECTOMY  child   COLONOSCOPY N/A 10/27/2017   Procedure: COLONOSCOPY;  Surgeon: Rogene Houston, MD;  [facility];  Service: Endoscopy;  Laterality: N/A;   CYSTOSCOPY/URETEROSCOPY/HOLMIUM LASER/STENT PLACEMENT Right 05/09/2018   Procedure: CYSTOSCOPY RIGHT URETEROSCOPY/HOLMIUM LASER/STENT PLACEMENT;  Surgeon:  Ardis Hughs, MD;  Location: [facility];  Service: Urology;  Laterality: Right;   HEMATOMA EVACUATION  1990s   right calf   INGUINAL HERNIA REPAIR Bilateral age 73s   PLANTAR FASCIA SURGERY Left 2004   TONSILLECTOMY  child   TRANSTHORACIC ECHOCARDIOGRAM  10/24/2017   ef 60-65%,  grade 1 diastolic dysfunction   Current Outpatient Medications on File Prior to Visit  Medication Sig Dispense Refill   Accu-Chek Softclix Lancets lancets Check bs bid DX: R73.03 100 each 12   albuterol (VENTOLIN HFA) 108 (90 Base) MCG/ACT inhaler Inhale 2 puffs into the lungs 3 (three) times daily. 8 g 1   amLODipine (NORVASC) 10 MG tablet Take 10 mg by mouth daily.     ascorbic acid (VITAMIN C) 500 MG tablet Take 1 tablet (500 mg total) by mouth daily. 30 tablet 1   Blood Glucose Monitoring Suppl (ACCU-CHEK GUIDE) w/Device KIT 1 Units by Does not apply route in the morning and at bedtime. 1 kit 0   glucose blood (ACCU-CHEK GUIDE) test strip Check bs bid DX: R73.03 100 each 3   Lancets Misc. (ACCU-CHEK SOFTCLIX LANCET DEV) KIT Check bs bid DX: R73.03 1 kit 0   metoprolol succinate (TOPROL-XL) 50 MG 24 hr tablet Take 50 mg by mouth daily. Take with or immediately following a meal.     Multiple Vitamin (MULTIVITAMIN WITH MINERALS) TABS tablet Take 1 tablet by mouth daily. 30 tablet 1  OXYGEN Inhale 3 L into the lungs. Continous     valsartan (DIOVAN) 160 MG tablet Take 1 tablet (160 mg total) by mouth daily. 90 tablet 3   zolpidem (AMBIEN) 10 MG tablet Take 1 tablet (10 mg total) by mouth at bedtime as needed for sleep. 15 tablet 1   zolpidem (AMBIEN) 5 MG tablet Take 1 tablet (5 mg total) by mouth at bedtime as needed for sleep. 15 tablet 1   No current facility-administered medications on file prior to visit.   Allergies  Allergen Reactions   Sulfa Antibiotics Hives and Shortness Of Breath   Crestor [Rosuvastatin Calcium] Other (See Comments)    Leg cramps   Lipitor [Atorvastatin  Calcium] Other (See Comments)    Leg cramps   Tetanus Toxoids Other (See Comments)    Unknown reaction   Social History   Socioeconomic History   Marital status: Married    Spouse name: Not on file   Number of children: Not on file   Years of education: Not on file   Highest education level: Not on file  Occupational History   Not on file  Tobacco Use   Smoking status: Former    Years: 20.00    Types: Cigarettes    Quit date: 05/04/2006    Years since quitting: 16.5   Smokeless tobacco: Former    Types: Chew    Quit date: 05/04/2017  Vaping Use   Vaping Use: Never used  Substance and Sexual Activity   Alcohol use: No   Drug use: No   Sexual activity: Yes  Other Topics Concern   Not on file  Social History Narrative   Married.   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   Social Determinants of Health   Financial Resource Strain: Low Risk  (08/17/2022)   Overall Financial Resource Strain (CARDIA)    Difficulty of Paying Living Expenses: Not hard at all  Food Insecurity: No Food Insecurity (08/17/2022)   Hunger Vital Sign    Worried About Running Out of Food in the Last Year: Never true    Ran Out of Food in the Last Year: Never true  Transportation Needs: No Transportation Needs (08/17/2022)   PRAPARE - Hydrologist (Medical): No    Lack of Transportation (Non-Medical): No  Physical Activity: Sufficiently Active (08/17/2022)   Exercise Vital Sign    Days of Exercise per Week: 5 days    Minutes of Exercise per Session: 30 min  Stress: No Stress Concern Present (08/17/2022)   [place]    Feeling of Stress : Not at all  Social Connections: [place] (08/17/2022)   Social Connection and Isolation Panel [NHANES]    Frequency of Communication with Friends and Family: More than three times a week    Frequency of Social Gatherings with Friends and Family: More than three times a week     Attends Religious Services: More than 4 times per year    Active Member of [employer] or Organizations: Yes    Attends Music therapist: More than 4 times per year    Marital Status: Married  Human resources officer Violence: Not At Risk (08/17/2022)   Humiliation, Afraid, Rape, and Kick questionnaire    Fear of Current or Ex-Partner: No    Emotionally Abused: No    Physically Abused: No    Sexually Abused: No   Family History  Problem Relation Age of Onset   Thyroid cancer Mother  Heart disease Father        MI   Colon cancer Father    Melanoma Father    Benign prostatic hyperplasia Brother    Diabetes Mellitus II Maternal Grandmother    Ovarian cancer Daughter     Review of Systems     Objective:   Physical Exam Vitals reviewed.  Constitutional:      General: He is not in acute distress.    Appearance: Normal appearance. He is normal weight. He is not ill-appearing or toxic-appearing.  Cardiovascular:     Rate and Rhythm: Normal rate and regular rhythm.     Heart sounds: Normal heart sounds. No murmur heard.    No gallop.  Pulmonary:     Effort: Pulmonary effort is normal. No respiratory distress.     Breath sounds: Normal breath sounds. No stridor. No wheezing, rhonchi or rales.  Skin:    Findings: No rash.  Neurological:     General: No focal deficit present.     Mental Status: He is alert and oriented to person, place, and time. Mental status is at baseline.     Cranial Nerves: No cranial nerve deficit.        Assessment & Plan:  Rheumatoid arthritis with positive rheumatoid factor, involving unspecified site ([place]) - Plan: CBC with Differential/Platelet, COMPLETE METABOLIC PANEL WITH GFR, Rheumatoid factor, Sedimentation rate  Primary hypertension - Plan: metoprolol succinate (TOPROL-XL) 50 MG 24 hr tablet, valsartan (DIOVAN) 160 MG tablet, amLODipine (NORVASC) 5 MG tablet, CBC with Differential/Platelet, COMPLETE METABOLIC PANEL WITH GFR, Rheumatoid  factor, Sedimentation rate  Sleep disturbance - Plan: zolpidem (AMBIEN) 5 MG tablet Reduce amlodipine to 10 mg a day.  Increase valsartan to 160 mg twice daily, continue metoprolol 50 mg once daily.  I refilled his Ambien that he uses for insomnia and I will check baseline labs.  Patient would like to try amlodipine 5 mg twice daily because he believes that this will help keep the swelling down.  I am fine trying that.

## 2022-11-15 LAB — COMPLETE METABOLIC PANEL WITH GFR
AG Ratio: 1.6 (calc) (ref 1.0–2.5)
ALT: 13 U/L (ref 9–46)
AST: 16 U/L (ref 10–35)
Albumin: 4.5 g/dL (ref 3.6–5.1)
Alkaline phosphatase (APISO): 77 U/L (ref 35–144)
BUN/Creatinine Ratio: 10 (calc) (ref 6–22)
BUN: 18 mg/dL (ref 7–25)
CO2: 16 mmol/L — ABNORMAL LOW (ref 20–32)
Calcium: 9.7 mg/dL (ref 8.6–10.3)
Chloride: 107 mmol/L (ref 98–110)
Creat: 1.74 mg/dL — ABNORMAL HIGH (ref 0.70–1.28)
Globulin: 2.8 g/dL (calc) (ref 1.9–3.7)
Glucose, Bld: 88 mg/dL (ref 65–99)
Potassium: 4 mmol/L (ref 3.5–5.3)
Sodium: 142 mmol/L (ref 135–146)
Total Bilirubin: 0.6 mg/dL (ref 0.2–1.2)
Total Protein: 7.3 g/dL (ref 6.1–8.1)
eGFR: 40 mL/min/{1.73_m2} — ABNORMAL LOW (ref >=60)

## 2022-11-15 LAB — CBC WITH DIFFERENTIAL/PLATELET
Absolute Monocytes: 627 cells/uL (ref 200–950)
Basophils Absolute: 90 cells/uL (ref 0–200)
Basophils Relative: 1.6 %
Eosinophils Absolute: 246 cells/uL (ref 15–500)
Eosinophils Relative: 4.4 %
HCT: 45 % (ref 38.5–50.0)
Hemoglobin: 14.7 g/dL (ref 13.2–17.1)
Lymphs Abs: 1238 cells/uL (ref 850–3900)
MCH: 25.2 pg — ABNORMAL LOW (ref 27.0–33.0)
MCHC: 32.7 g/dL (ref 32.0–36.0)
MCV: 77.2 fL — ABNORMAL LOW (ref 80.0–100.0)
MPV: 10.3 fL (ref 7.5–12.5)
Monocytes Relative: 11.2 %
Neutro Abs: 3399 cells/uL (ref 1500–7800)
Neutrophils Relative %: 60.7 %
Platelets: 217 10*3/uL (ref 140–400)
RBC: 5.83 10*6/uL — ABNORMAL HIGH (ref 4.20–5.80)
RDW: 17.9 % — ABNORMAL HIGH (ref 11.0–15.0)
Total Lymphocyte: 22.1 %
WBC: 5.6 10*3/uL (ref 3.8–10.8)

## 2022-11-15 LAB — SEDIMENTATION RATE: Sed Rate: 6 mm/h (ref 0–20)

## 2022-11-15 LAB — RHEUMATOID FACTOR: Rheumatoid fact SerPl-aCnc: 69 IU/mL — ABNORMAL HIGH (ref <14)

## 2022-11-30 ENCOUNTER — Telehealth: Payer: Self-pay | Admitting: Family Medicine

## 2022-11-30 NOTE — Telephone Encounter (Signed)
Patient called to report he took two home tests yesterday; both were positive for COVID. Requesting for provider to call in script for paxlovid.   Pharmacy confirmed as:   [facility], [place] AT [place] [address], [place] [address] Phone: [phone]  Fax: [phone] DEA #: [id number]   Sx began Sunday night. Sx: sore throat, headache, no fever, cough, head and chest congestion; slight body aches and back pain.   Please advise patient when script is ready for pickup at [phone].

## 2022-12-01 ENCOUNTER — Other Ambulatory Visit: Payer: Self-pay | Admitting: Family Medicine

## 2022-12-01 MED ORDER — MOLNUPIRAVIR EUA 200MG CAPSULE: 4.0000 | ORAL_CAPSULE | Freq: Two times a day (BID) | ORAL | 0 refills | Status: AC

## 2022-12-21 ENCOUNTER — Other Ambulatory Visit: Payer: Self-pay | Admitting: Family Medicine

## 2022-12-28 ENCOUNTER — Telehealth: Payer: Self-pay | Admitting: Family Medicine

## 2022-12-28 DIAGNOSIS — E782 Mixed hyperlipidemia: Secondary | ICD-10-CM | POA: Diagnosis not present

## 2022-12-28 DIAGNOSIS — I10 Essential (primary) hypertension: Secondary | ICD-10-CM | POA: Diagnosis not present

## 2022-12-28 DIAGNOSIS — N189 Chronic kidney disease, unspecified: Secondary | ICD-10-CM | POA: Diagnosis not present

## 2022-12-28 DIAGNOSIS — Z79899 Other long term (current) drug therapy: Secondary | ICD-10-CM | POA: Diagnosis not present

## 2022-12-28 DIAGNOSIS — M0579 Rheumatoid arthritis with rheumatoid factor of multiple sites without organ or systems involvement: Secondary | ICD-10-CM | POA: Diagnosis not present

## 2022-12-28 DIAGNOSIS — Z8739 Personal history of other diseases of the musculoskeletal system and connective tissue: Secondary | ICD-10-CM | POA: Diagnosis not present

## 2022-12-28 NOTE — Telephone Encounter (Signed)
Patient called to request for provider to fax his last lab results from DOS 11/14/2022 to [facility] (Rheumatologist Dr. Dennard Schaumann referred him to) so he doesn't have to take new labs today.  Fax number is [phone].  Please advise patient with questions at [phone].

## 2023-01-09 ENCOUNTER — Other Ambulatory Visit: Payer: Self-pay | Admitting: Family Medicine

## 2023-01-09 DIAGNOSIS — R338 Other retention of urine: Secondary | ICD-10-CM | POA: Diagnosis not present

## 2023-01-09 DIAGNOSIS — G479 Sleep disorder, unspecified: Secondary | ICD-10-CM

## 2023-01-09 DIAGNOSIS — Z1330 Encounter for screening examination for mental health and behavioral disorders, unspecified: Secondary | ICD-10-CM | POA: Diagnosis not present

## 2023-01-09 DIAGNOSIS — N401 Enlarged prostate with lower urinary tract symptoms: Secondary | ICD-10-CM | POA: Diagnosis not present

## 2023-03-06 ENCOUNTER — Other Ambulatory Visit: Payer: Self-pay | Admitting: Family Medicine

## 2023-03-06 DIAGNOSIS — G479 Sleep disorder, unspecified: Secondary | ICD-10-CM

## 2023-03-06 NOTE — Telephone Encounter (Signed)
Requested medication (s) are due for refill today - yes  Requested medication (s) are on the active medication list -yes  Future visit scheduled -no  Last refill: 01/09/23 #30 1RF  Notes to clinic: non delegated Rx  Requested Prescriptions  Pending Prescriptions Disp Refills   zolpidem (AMBIEN) 5 MG tablet [Pharmacy Med Name: ZOLPIDEM 5MG  TABLETS] 30 tablet     Sig: TAKE 1 TABLET(5 MG) BY MOUTH AT BEDTIME AS NEEDED FOR SLEEP     Not Delegated - Psychiatry:  Anxiolytics/Hypnotics Failed - 03/06/2023  9:03 AM      Failed - This refill cannot be delegated      Failed - Urine Drug Screen completed in last 360 days      Failed - Valid encounter within last 6 months    Recent Outpatient Visits           1 year ago Polyarthralgia   [facility] Pickard, Priscille Heidelberg, MD   1 year ago Vasculitis [facility])   [facility] Pickard, Priscille Heidelberg, MD   1 year ago Primary hypertension   [facility] Tanya Nones, Priscille Heidelberg, MD   1 year ago Muscle weakness (generalized)   [facility] Pickard, Priscille Heidelberg, MD   2 years ago Prediabetes   [facility] Pickard, Priscille Heidelberg, MD                 Requested Prescriptions  Pending Prescriptions Disp Refills   zolpidem (AMBIEN) 5 MG tablet [Pharmacy Med Name: ZOLPIDEM 5MG  TABLETS] 30 tablet     Sig: TAKE 1 TABLET(5 MG) BY MOUTH AT BEDTIME AS NEEDED FOR SLEEP     Not Delegated - Psychiatry:  Anxiolytics/Hypnotics Failed - 03/06/2023  9:03 AM      Failed - This refill cannot be delegated      Failed - Urine Drug Screen completed in last 360 days      Failed - Valid encounter within last 6 months    Recent Outpatient Visits           1 year ago Polyarthralgia   [facility] Donita Brooks, MD   1 year ago Vasculitis [facility])   [facility] Pickard, Priscille Heidelberg, MD   1 year ago Primary hypertension   [facility] Pickard, Priscille Heidelberg, MD    1 year ago Muscle weakness (generalized)   [facility] Pickard, Priscille Heidelberg, MD   2 years ago Prediabetes   [facility] Pickard, Priscille Heidelberg, MD

## 2023-04-05 DIAGNOSIS — E782 Mixed hyperlipidemia: Secondary | ICD-10-CM | POA: Diagnosis not present

## 2023-04-05 DIAGNOSIS — Z79899 Other long term (current) drug therapy: Secondary | ICD-10-CM | POA: Diagnosis not present

## 2023-04-05 DIAGNOSIS — Z8739 Personal history of other diseases of the musculoskeletal system and connective tissue: Secondary | ICD-10-CM | POA: Diagnosis not present

## 2023-04-05 DIAGNOSIS — N189 Chronic kidney disease, unspecified: Secondary | ICD-10-CM | POA: Diagnosis not present

## 2023-04-05 DIAGNOSIS — I10 Essential (primary) hypertension: Secondary | ICD-10-CM | POA: Diagnosis not present

## 2023-04-05 DIAGNOSIS — M0579 Rheumatoid arthritis with rheumatoid factor of multiple sites without organ or systems involvement: Secondary | ICD-10-CM | POA: Diagnosis not present

## 2023-04-24 ENCOUNTER — Ambulatory Visit (INDEPENDENT_AMBULATORY_CARE_PROVIDER_SITE_OTHER): Payer: Medicare Other | Admitting: Family Medicine

## 2023-04-24 VITALS — BP 138/72 | HR 70 | Temp 97.6°F | Wt 200.0 lb

## 2023-04-24 DIAGNOSIS — M059 Rheumatoid arthritis with rheumatoid factor, unspecified: Secondary | ICD-10-CM | POA: Diagnosis not present

## 2023-04-24 DIAGNOSIS — I10 Essential (primary) hypertension: Secondary | ICD-10-CM | POA: Diagnosis not present

## 2023-04-24 DIAGNOSIS — R7303 Prediabetes: Secondary | ICD-10-CM | POA: Diagnosis not present

## 2023-04-24 NOTE — Progress Notes (Signed)
Subjective:    Patient ID: Mike Davila, male    DOB: 1946-02-21, 77 y.o.   MRN: [id number]  HPI Here for follow up of HTN and prediabetes.  BP is excellent.  Denies CP,SOB, DOE.  Denies polyuria, polydypsia, or BV.  Currently on leflunomide for rheumatoid arthritis.  Denies any joint pain.  Due to monitor liver function test and CBC which I will be happy to forward to his rheumatologist. Past Medical History:  Diagnosis Date   Anemia    Benign localized prostatic hyperplasia with lower urinary tract symptoms (LUTS)    CKD (chronic kidney disease), stage III [facility])    nephrologist-  dr Fausto Skillern ([facility] in [place])-- per pt last visit 06/ 2019   Diverticulosis of colon    ED (erectile dysfunction)    History of acute gouty arthritis 10/23/2017   History of acute renal failure 10/23/2017   hospital admission-- w/ acute tubular necrosis superimposed on ckd 3--- resolved   History of diverticulitis of colon 10/05/2017   admission-- mild-- resolved w/ medical management   History of metabolic acidosis 10/23/2017   admission-- resolved   History of palpitations    approx. 2009, per pt no issue since   History of urinary retention 10/2017   Hypertension    Incomplete right bundle branch block    Mixed hyperlipidemia    Nephrolithiasis    left side nonobstructive   Pre-diabetes    Rheumatoid arthritis (HCC)    Right ureteral stone    Wears dentures    upper    Past Surgical History:  Procedure Laterality Date   ANKLE SURGERY Bilateral 1990s to early 2000s   removal bone spurs   APPENDECTOMY  child   COLONOSCOPY N/A 10/27/2017   Procedure: COLONOSCOPY;  Surgeon: Malissa Hippo, MD;  [facility];  Service: Endoscopy;  Laterality: N/A;   CYSTOSCOPY/URETEROSCOPY/HOLMIUM LASER/STENT PLACEMENT Right 05/09/2018   Procedure: CYSTOSCOPY RIGHT URETEROSCOPY/HOLMIUM LASER/STENT PLACEMENT;  Surgeon: Crist Fat, MD;  Location: [facility];  Service:  Urology;  Laterality: Right;   HEMATOMA EVACUATION  1990s   right calf   INGUINAL HERNIA REPAIR Bilateral age 36s   PLANTAR FASCIA SURGERY Left 2004   TONSILLECTOMY  child   TRANSTHORACIC ECHOCARDIOGRAM  10/24/2017   ef 60-65%,  grade 1 diastolic dysfunction   Current Outpatient Medications on File Prior to Visit  Medication Sig Dispense Refill   Accu-Chek Softclix Lancets lancets Check bs bid DX: R73.03 100 each 12   albuterol (VENTOLIN HFA) 108 (90 Base) MCG/ACT inhaler Inhale 2 puffs into the lungs 3 (three) times daily. 8 g 1   amLODipine (NORVASC) 5 MG tablet Take 1 tablet (5 mg total) by mouth in the morning and at bedtime. 180 tablet 3   ascorbic acid (VITAMIN C) 500 MG tablet Take 1 tablet (500 mg total) by mouth daily. 30 tablet 1   Blood Glucose Monitoring Suppl (ACCU-CHEK GUIDE) w/Device KIT 1 Units by Does not apply route in the morning and at bedtime. 1 kit 0   glucose blood (ACCU-CHEK GUIDE) test strip Check bs bid DX: R73.03 100 each 3   Lancets Misc. (ACCU-CHEK SOFTCLIX LANCET DEV) KIT Check bs bid DX: R73.03 1 kit 0   metoprolol succinate (TOPROL-XL) 25 MG 24 hr tablet TAKE 1 TABLET BY MOUTH ONCE DAILY 90 tablet 3   metoprolol succinate (TOPROL-XL) 50 MG 24 hr tablet Take 1 tablet (50 mg total) by mouth daily. Take with or immediately following a  meal. 90 tablet 3   Multiple Vitamin (MULTIVITAMIN WITH MINERALS) TABS tablet Take 1 tablet by mouth daily. 30 tablet 1   OXYGEN Inhale 3 L into the lungs. Continous     valsartan (DIOVAN) 160 MG tablet Take 1 tablet (160 mg total) by mouth 2 (two) times daily. 180 tablet 3   zolpidem (AMBIEN) 5 MG tablet TAKE 1 TABLET(5 MG) BY MOUTH AT BEDTIME AS NEEDED FOR SLEEP 30 tablet 2   zolpidem (AMBIEN) 10 MG tablet Take 1 tablet (10 mg total) by mouth at bedtime as needed for sleep. 15 tablet 1   No current facility-administered medications on file prior to visit.   Allergies  Allergen Reactions   Sulfa Antibiotics Hives and Shortness  Of Breath   Crestor [Rosuvastatin Calcium] Other (See Comments)    Leg cramps   Lipitor [Atorvastatin Calcium] Other (See Comments)    Leg cramps   Tetanus Toxoids Other (See Comments)    Unknown reaction   Social History   Socioeconomic History   Marital status: Married    Spouse name: Not on file   Number of children: Not on file   Years of education: Not on file   Highest education level: 12th grade  Occupational History   Not on file  Tobacco Use   Smoking status: Former    Years: 20    Types: Cigarettes    Quit date: 05/04/2006    Years since quitting: 16.9   Smokeless tobacco: Former    Types: Chew    Quit date: 05/04/2017  Vaping Use   Vaping Use: Never used  Substance and Sexual Activity   Alcohol use: No   Drug use: No   Sexual activity: Yes  Other Topics Concern   Not on file  Social History Narrative   Married.   D[employer].   Social Determinants of Health   Financial Resource Strain: Low Risk  (04/20/2023)   Overall Financial Resource Strain (CARDIA)    Difficulty of Paying Living Expenses: Not hard at all  Food Insecurity: No Food Insecurity (04/20/2023)   Hunger Vital Sign    Worried About Running Out of Food in the Last Year: Never true    Ran Out of Food in the Last Year: Never true  Transportation Needs: No Transportation Needs (04/20/2023)   PRAPARE - Administrator, Civil Service (Medical): No    Lack of Transportation (Non-Medical): No  Physical Activity: Inactive (04/20/2023)   Exercise Vital Sign    Days of Exercise per Week: 0 days    Minutes of Exercise per Session: 30 min  Stress: No Stress Concern Present (04/20/2023)   [employer] of Occupational Health - Occupational Stress Questionnaire    Feeling of Stress : Not at all  Social Connections: Moderately Integrated (04/20/2023)   Social Connection and Isolation Panel [NHANES]    Frequency of Communication with Friends and Family: More than three times a week     Frequency of Social Gatherings with Friends and Family: More than three times a week    Attends Religious Services: Never    Database administrator or Organizations: No    Attends Engineer, structural: More than 4 times per year    Marital Status: Married  Catering manager Violence: Not At Risk (08/17/2022)   Humiliation, Afraid, Rape, and Kick questionnaire    Fear of Current or Ex-Partner: No    Emotionally Abused: No    Physically Abused: No  Sexually Abused: No   Family History  Problem Relation Age of Onset   Thyroid cancer Mother    Heart disease Father        MI   Colon cancer Father    Melanoma Father    Benign prostatic hyperplasia Brother    Diabetes Mellitus II Maternal Grandmother    Ovarian cancer Daughter     Review of Systems     Objective:   Physical Exam Vitals reviewed.  Constitutional:      General: He is not in acute distress.    Appearance: Normal appearance. He is normal weight. He is not ill-appearing or toxic-appearing.  Cardiovascular:     Rate and Rhythm: Normal rate and regular rhythm.     Heart sounds: Normal heart sounds. No murmur heard.    No gallop.  Pulmonary:     Effort: Pulmonary effort is normal. No respiratory distress.     Breath sounds: Normal breath sounds. No stridor. No wheezing, rhonchi or rales.  Skin:    Findings: No rash.  Neurological:     General: No focal deficit present.     Mental Status: He is alert and oriented to person, place, and time. Mental status is at baseline.     Cranial Nerves: No cranial nerve deficit.        Assessment & Plan:  Rheumatoid arthritis with positive rheumatoid factor, involving unspecified site (HCC) - Plan: CBC with Differential/Platelet, Lipid panel, COMPLETE METABOLIC PANEL WITH GFR, Hemoglobin A1c  Primary hypertension - Plan: CBC with Differential/Platelet, Lipid panel, COMPLETE METABOLIC PANEL WITH GFR, Hemoglobin A1c  Prediabetes - Plan: CBC with  Differential/Platelet, Lipid panel, COMPLETE METABOLIC PANEL WITH GFR, Hemoglobin A1c BP excellent at 138/72.  Check cbc, cmp, flp and HgA1c.  Goal A1c ius less than 6.5 and goal LDL is less than 100. Forward CBC and CMP to rheumatology and monitor renal fcn which has been stable.

## 2023-04-25 LAB — COMPLETE METABOLIC PANEL WITH GFR
AG Ratio: 1.8 (calc) (ref 1.0–2.5)
ALT: 16 U/L (ref 9–46)
AST: 15 U/L (ref 10–35)
Albumin: 4.6 g/dL (ref 3.6–5.1)
Alkaline phosphatase (APISO): 75 U/L (ref 35–144)
BUN/Creatinine Ratio: 9 (calc) (ref 6–22)
BUN: 16 mg/dL (ref 7–25)
CO2: 27 mmol/L (ref 20–32)
Calcium: 9.8 mg/dL (ref 8.6–10.3)
Chloride: 106 mmol/L (ref 98–110)
Creat: 1.78 mg/dL — ABNORMAL HIGH (ref 0.70–1.28)
Globulin: 2.5 g/dL (calc) (ref 1.9–3.7)
Glucose, Bld: 132 mg/dL — ABNORMAL HIGH (ref 65–99)
Potassium: 4.3 mmol/L (ref 3.5–5.3)
Sodium: 142 mmol/L (ref 135–146)
Total Bilirubin: 0.5 mg/dL (ref 0.2–1.2)
Total Protein: 7.1 g/dL (ref 6.1–8.1)
eGFR: 39 mL/min/{1.73_m2} — ABNORMAL LOW (ref >=60)

## 2023-04-25 LAB — LIPID PANEL
Cholesterol: 219 mg/dL — ABNORMAL HIGH (ref <200)
HDL: 38 mg/dL — ABNORMAL LOW (ref >=40)
LDL Cholesterol (Calc): 139 mg/dL (calc) — ABNORMAL HIGH
Non-HDL Cholesterol (Calc): 181 mg/dL (calc) — ABNORMAL HIGH (ref <130)
Total CHOL/HDL Ratio: 5.8 (calc) — ABNORMAL HIGH (ref <5.0)
Triglycerides: 297 mg/dL — ABNORMAL HIGH (ref <150)

## 2023-04-25 LAB — CBC WITH DIFFERENTIAL/PLATELET
Absolute Monocytes: 494 cells/uL (ref 200–950)
Basophils Absolute: 48 cells/uL (ref 0–200)
Basophils Relative: 1 %
Eosinophils Absolute: 130 cells/uL (ref 15–500)
Eosinophils Relative: 2.7 %
HCT: 43.8 % (ref 38.5–50.0)
Hemoglobin: 14.4 g/dL (ref 13.2–17.1)
Lymphs Abs: 1032 cells/uL (ref 850–3900)
MCH: 27.4 pg (ref 27.0–33.0)
MCHC: 32.9 g/dL (ref 32.0–36.0)
MCV: 83.4 fL (ref 80.0–100.0)
MPV: 10.7 fL (ref 7.5–12.5)
Monocytes Relative: 10.3 %
Neutro Abs: 3096 cells/uL (ref 1500–7800)
Neutrophils Relative %: 64.5 %
Platelets: 185 10*3/uL (ref 140–400)
RBC: 5.25 10*6/uL (ref 4.20–5.80)
RDW: 14.5 % (ref 11.0–15.0)
Total Lymphocyte: 21.5 %
WBC: 4.8 10*3/uL (ref 3.8–10.8)

## 2023-04-25 LAB — HEMOGLOBIN A1C
Hgb A1c MFr Bld: 6 % of total Hgb — ABNORMAL HIGH (ref <5.7)
Mean Plasma Glucose: 126 mg/dL
eAG (mmol/L): 7 mmol/L

## 2023-04-26 ENCOUNTER — Other Ambulatory Visit: Payer: Self-pay

## 2023-04-26 MED ORDER — EZETIMIBE 10 MG PO TABS: 10.0000 mg | ORAL_TABLET | Freq: Every day | ORAL | 3 refills | Status: DC

## 2023-04-28 ENCOUNTER — Other Ambulatory Visit: Payer: Self-pay

## 2023-06-06 ENCOUNTER — Other Ambulatory Visit: Payer: Self-pay | Admitting: Family Medicine

## 2023-06-06 DIAGNOSIS — G479 Sleep disorder, unspecified: Secondary | ICD-10-CM

## 2023-06-08 NOTE — Telephone Encounter (Signed)
Requested medication (s) are due for refill today - yes  Requested medication (s) are on the active medication list -yes  Future visit scheduled -no  Last refill: 03/07/23 #30 2RF  Notes to clinic: non delegated Rx  Requested Prescriptions  Pending Prescriptions Disp Refills   zolpidem (AMBIEN) 5 MG tablet [Pharmacy Med Name: ZOLPIDEM 5MG  TABLETS] 30 tablet     Sig: TAKE 1 TABLET(5 MG) BY MOUTH AT BEDTIME AS NEEDED FOR SLEEP     Not Delegated - Psychiatry:  Anxiolytics/Hypnotics Failed - 06/06/2023  5:39 PM      Failed - This refill cannot be delegated      Failed - Urine Drug Screen completed in last 360 days      Failed - Valid encounter within last 6 months    Recent Outpatient Visits           1 year ago Polyarthralgia   [facility] Donita Brooks, MD   2 years ago Vasculitis [facility])   [facility] Pickard, Priscille Heidelberg, MD   2 years ago Primary hypertension   [facility] Tanya Nones, Priscille Heidelberg, MD   2 years ago Muscle weakness (generalized)   [facility] Pickard, Priscille Heidelberg, MD   2 years ago Prediabetes   [facility] Pickard, Priscille Heidelberg, MD                 Requested Prescriptions  Pending Prescriptions Disp Refills   zolpidem (AMBIEN) 5 MG tablet [Pharmacy Med Name: ZOLPIDEM 5MG  TABLETS] 30 tablet     Sig: TAKE 1 TABLET(5 MG) BY MOUTH AT BEDTIME AS NEEDED FOR SLEEP     Not Delegated - Psychiatry:  Anxiolytics/Hypnotics Failed - 06/06/2023  5:39 PM      Failed - This refill cannot be delegated      Failed - Urine Drug Screen completed in last 360 days      Failed - Valid encounter within last 6 months    Recent Outpatient Visits           1 year ago Polyarthralgia   [facility] Donita Brooks, MD   2 years ago Vasculitis [facility])   [facility] Pickard, Priscille Heidelberg, MD   2 years ago Primary hypertension   [facility] Tanya Nones, Priscille Heidelberg, MD   2 years ago Muscle weakness (generalized)   [facility] Pickard, Priscille Heidelberg, MD   2 years ago Prediabetes   [facility] Pickard, Priscille Heidelberg, MD

## 2023-06-09 ENCOUNTER — Telehealth: Payer: Self-pay

## 2023-06-09 ENCOUNTER — Other Ambulatory Visit: Payer: Self-pay | Admitting: Family Medicine

## 2023-06-09 ENCOUNTER — Other Ambulatory Visit: Payer: Self-pay

## 2023-06-09 DIAGNOSIS — G479 Sleep disorder, unspecified: Secondary | ICD-10-CM

## 2023-06-09 MED ORDER — ZOLPIDEM TARTRATE 5 MG PO TABS
5.0000 mg | ORAL_TABLET | Freq: Every evening | ORAL | 2 refills | Status: AC | PRN
Start: 2023-06-09 — End: ?

## 2023-06-09 NOTE — Telephone Encounter (Signed)
Needs refill on Zolpidem. Last RF was 03/07/23. Last  OV 01/09/23.  Pt uses [facility].

## 2023-06-09 NOTE — Telephone Encounter (Signed)
Pharmacy sent 2nd refill request for zolpidem (AMBIEN) 5 MG tablet  Please see previous message in thread for original refill request info.   Pharmacy:   [facility] [phone] - [place], [place] - [address] AT [facility] [address], [place] [address] Phone: [phone]  Fax: [phone] DEA #: [id number]   Please advise pharmacist.

## 2023-07-17 DIAGNOSIS — R338 Other retention of urine: Secondary | ICD-10-CM | POA: Diagnosis not present

## 2023-07-17 DIAGNOSIS — N401 Enlarged prostate with lower urinary tract symptoms: Secondary | ICD-10-CM | POA: Diagnosis not present

## 2023-08-01 ENCOUNTER — Other Ambulatory Visit: Payer: Medicare Other

## 2023-08-01 DIAGNOSIS — D649 Anemia, unspecified: Secondary | ICD-10-CM

## 2023-08-01 DIAGNOSIS — E781 Pure hyperglyceridemia: Secondary | ICD-10-CM | POA: Diagnosis not present

## 2023-08-01 DIAGNOSIS — R739 Hyperglycemia, unspecified: Secondary | ICD-10-CM | POA: Diagnosis not present

## 2023-08-01 DIAGNOSIS — N183 Chronic kidney disease, stage 3 unspecified: Secondary | ICD-10-CM

## 2023-08-01 DIAGNOSIS — I10 Essential (primary) hypertension: Secondary | ICD-10-CM | POA: Diagnosis not present

## 2023-08-01 DIAGNOSIS — E785 Hyperlipidemia, unspecified: Secondary | ICD-10-CM

## 2023-08-02 DIAGNOSIS — Z79899 Other long term (current) drug therapy: Secondary | ICD-10-CM | POA: Diagnosis not present

## 2023-08-02 DIAGNOSIS — E782 Mixed hyperlipidemia: Secondary | ICD-10-CM | POA: Diagnosis not present

## 2023-08-02 DIAGNOSIS — M0579 Rheumatoid arthritis with rheumatoid factor of multiple sites without organ or systems involvement: Secondary | ICD-10-CM | POA: Diagnosis not present

## 2023-08-02 DIAGNOSIS — N189 Chronic kidney disease, unspecified: Secondary | ICD-10-CM | POA: Diagnosis not present

## 2023-08-02 DIAGNOSIS — Z8739 Personal history of other diseases of the musculoskeletal system and connective tissue: Secondary | ICD-10-CM | POA: Diagnosis not present

## 2023-08-02 DIAGNOSIS — I10 Essential (primary) hypertension: Secondary | ICD-10-CM | POA: Diagnosis not present

## 2023-08-02 LAB — CBC WITH DIFFERENTIAL/PLATELET
Absolute Monocytes: 519 {cells}/uL (ref 200–950)
Basophils Absolute: 69 {cells}/uL (ref 0–200)
Basophils Relative: 1.4 %
Eosinophils Absolute: 221 {cells}/uL (ref 15–500)
Eosinophils Relative: 4.5 %
HCT: 44.7 % (ref 38.5–50.0)
Hemoglobin: 14.2 g/dL (ref 13.2–17.1)
Lymphs Abs: 1264 {cells}/uL (ref 850–3900)
MCH: 27.3 pg (ref 27.0–33.0)
MCHC: 31.8 g/dL — ABNORMAL LOW (ref 32.0–36.0)
MCV: 86 fL (ref 80.0–100.0)
MPV: 10.7 fL (ref 7.5–12.5)
Monocytes Relative: 10.6 %
Neutro Abs: 2827 {cells}/uL (ref 1500–7800)
Neutrophils Relative %: 57.7 %
Platelets: 191 10*3/uL (ref 140–400)
RBC: 5.2 10*6/uL (ref 4.20–5.80)
RDW: 14.5 % (ref 11.0–15.0)
Total Lymphocyte: 25.8 %
WBC: 4.9 10*3/uL (ref 3.8–10.8)

## 2023-08-02 LAB — HEMOGLOBIN A1C
Hgb A1c MFr Bld: 6 %{Hb} — ABNORMAL HIGH (ref <5.7)
Mean Plasma Glucose: 126 mg/dL
eAG (mmol/L): 7 mmol/L

## 2023-08-02 LAB — COMPLETE METABOLIC PANEL WITH GFR
AG Ratio: 1.7 (calc) (ref 1.0–2.5)
ALT: 12 U/L (ref 9–46)
AST: 15 U/L (ref 10–35)
Albumin: 4.6 g/dL (ref 3.6–5.1)
Alkaline phosphatase (APISO): 74 U/L (ref 35–144)
BUN/Creatinine Ratio: 9 (calc) (ref 6–22)
BUN: 18 mg/dL (ref 7–25)
CO2: 22 mmol/L (ref 20–32)
Calcium: 9.9 mg/dL (ref 8.6–10.3)
Chloride: 108 mmol/L (ref 98–110)
Creat: 2.06 mg/dL — ABNORMAL HIGH (ref 0.70–1.28)
Globulin: 2.7 g/dL (ref 1.9–3.7)
Glucose, Bld: 112 mg/dL — ABNORMAL HIGH (ref 65–99)
Potassium: 4 mmol/L (ref 3.5–5.3)
Sodium: 142 mmol/L (ref 135–146)
Total Bilirubin: 0.6 mg/dL (ref 0.2–1.2)
Total Protein: 7.3 g/dL (ref 6.1–8.1)
eGFR: 33 mL/min/{1.73_m2} — ABNORMAL LOW (ref >=60)

## 2023-08-02 LAB — LIPID PANEL
Cholesterol: 177 mg/dL (ref <200)
HDL: 34 mg/dL — ABNORMAL LOW (ref >=40)
LDL Cholesterol (Calc): 106 mg/dL — ABNORMAL HIGH
Non-HDL Cholesterol (Calc): 143 mg/dL — ABNORMAL HIGH (ref <130)
Total CHOL/HDL Ratio: 5.2 (calc) — ABNORMAL HIGH (ref <5.0)
Triglycerides: 242 mg/dL — ABNORMAL HIGH (ref <150)

## 2023-10-31 ENCOUNTER — Other Ambulatory Visit: Payer: Self-pay | Admitting: Family Medicine

## 2023-10-31 DIAGNOSIS — I10 Essential (primary) hypertension: Secondary | ICD-10-CM

## 2023-11-05 ENCOUNTER — Other Ambulatory Visit: Payer: Self-pay | Admitting: Family Medicine

## 2023-11-05 DIAGNOSIS — I10 Essential (primary) hypertension: Secondary | ICD-10-CM

## 2023-11-07 NOTE — Telephone Encounter (Signed)
 Requested medication (s) are due for refill today:   Yes  Requested medication (s) are on the active medication list:   Yes  Future visit scheduled:   No.   LOV 04/24/2023.    Had labs drawn 08/01/2023 but no visit   Last ordered: 11/14/2022 #180, 3 refills  Unable to refill because an office visit is due per protocol but looks like he had blood work done and Dr. Duanne advised him via MyChart.   Dr. Duanne to review for refills.      Requested Prescriptions  Pending Prescriptions Disp Refills   valsartan  (DIOVAN ) 160 MG tablet [Pharmacy Med Name: VALSARTAN  160MG  TABLETS] 180 tablet 3    Sig: TAKE 1 TABLET(160 MG) BY MOUTH TWICE DAILY     Cardiovascular:  Angiotensin Receptor Blockers Failed - 11/07/2023 10:09 AM      Failed - Cr in normal range and within 180 days    Creat  Date Value Ref Range Status  08/01/2023 2.06 (H) 0.70 - 1.28 mg/dL Final   Creatinine, Urine  Date Value Ref Range Status  07/14/2020 107 20 - 320 mg/dL Final         Failed - Valid encounter within last 6 months    Recent Outpatient Visits           1 year ago Polyarthralgia   [facility] Duanne Butler DASEN, MD   2 years ago Vasculitis [facility])   [facility] Pickard, Butler DASEN, MD   2 years ago Primary hypertension   [facility] Duanne, Butler DASEN, MD   2 years ago Muscle weakness (generalized)   [facility] Duanne Butler DASEN, MD   2 years ago Prediabetes   [facility] Duanne, Butler DASEN, MD              Passed - K in normal range and within 180 days    Potassium  Date Value Ref Range Status  08/01/2023 4.0 3.5 - 5.3 mmol/L Final         Passed - Patient is not pregnant      Passed - Last BP in normal range    BP Readings from Last 1 Encounters:  04/24/23 138/72

## 2023-11-30 ENCOUNTER — Ambulatory Visit: Payer: Medicare Other | Admitting: Family Medicine

## 2023-11-30 ENCOUNTER — Ambulatory Visit: Payer: Self-pay | Admitting: Family Medicine

## 2023-11-30 VITALS — BP 118/66 | HR 73 | Temp 97.7°F | Ht 66.0 in | Wt 190.0 lb

## 2023-11-30 DIAGNOSIS — J209 Acute bronchitis, unspecified: Secondary | ICD-10-CM

## 2023-11-30 MED ORDER — ALBUTEROL SULFATE HFA 108 (90 BASE) MCG/ACT IN AERS: 2.0000 | INHALATION_SPRAY | Freq: Three times a day (TID) | RESPIRATORY_TRACT | 1 refills | Status: AC

## 2023-11-30 MED ORDER — PREDNISONE 20 MG PO TABS: 40.0000 mg | ORAL_TABLET | Freq: Every day | ORAL | 0 refills | Status: DC

## 2023-11-30 MED ORDER — AZITHROMYCIN 250 MG PO TABS: ORAL_TABLET | ORAL | 0 refills | Status: AC

## 2023-11-30 NOTE — Telephone Encounter (Signed)
 Chief Complaint: non productive cough Symptoms: cough Frequency: approx 1 week Pertinent Negatives: Patient denies fever, sob at rest,   Disposition: [] ED /[] Urgent Care (no appt availability in office) / [x] Appointment(In office/virtual)/ []  [place] [facility]/ [] Home Care/ [] Refused Recommended Disposition /[] [place] [facility]/ []  Follow-up with PCP  Additional Notes: Pt states that he has hx of double pneumonia with covid '21, because of this pt has o2 at his home. Pt states that his pulse ox at home has been 90% occasionally, with a few breaths of oxygen  95-96%.  Pt states that at times when coughing he can have SOB, but only when he coughs. Pt states that he has been using OTC medications, but nothing is loosening his phlegm up in the chest. Pt has not taken an at home covid test, states doesn't feel like covid.  Sched with PCP today  Copied from CRM [id number]. Topic: Clinical - Red Word Triage >> Nov 30, 2023  8:11 AM Elle L wrote: Red Word that prompted transfer to Nurse Triage: The patient has had a cough that started a week ago that is getting worse and the patient is on oxygen  and has shortness of breath. He believes it is pneuomonia. Reason for Disposition  [1] MILD difficulty breathing (e.g., minimal/no SOB at rest, SOB with walking, pulse <100) AND [2] still present when not coughing  Answer Assessment - Initial Assessment Questions 1. ONSET: When did the cough begin?      Approx 1 week ago 2. SEVERITY: How bad is the cough today?      moderate 3. SPUTUM: Describe the color of your sputum (none, dry cough; clear, white, yellow, green)     Denies sputum, states that it is to hard to get it up, can feel it there 4. HEMOPTYSIS: Are you coughing up any blood? If so ask: How much? (flecks, streaks, tablespoons, etc.)     denies 5. DIFFICULTY BREATHING: Are you having difficulty breathing? If Yes, ask: How bad is it? (e.g., mild, moderate, severe)    - MILD:  No SOB at rest, mild SOB with walking, speaks normally in sentences, can lie down, no retractions, pulse < 100.    - MODERATE: SOB at rest, SOB with minimal exertion and prefers to sit, cannot lie down flat, speaks in phrases, mild retractions, audible wheezing, pulse 100-120.    - SEVERE: Very SOB at rest, speaks in single words, struggling to breathe, sitting hunched forward, retractions, pulse > 120      SOB more than normal, has O2 to use at home d/t hx of covid, but states that he was not using it prior to this cough, but has been using it now 6. FEVER: Do you have a fever? If Yes, ask: What is your temperature, how was it measured, and when did it start?     Not that I can tell 7. CARDIAC HISTORY: Do you have any history of heart disease? (e.g., heart attack, congestive heart failure)      denies 8. LUNG HISTORY: Do you have any history of lung disease?  (e.g., pulmonary embolus, asthma, emphysema)     Double pneumonia in '21 with covid, previous smoker 9. PE RISK FACTORS: Do you have a history of blood clots? (or: recent major surgery, recent prolonged travel, bedridden)     denies 10. OTHER SYMPTOMS: Do you have any other symptoms? (e.g., runny nose, wheezing, chest pain)       Runny nose, sore throat 12. TRAVEL: Have you  traveled out of the country in the last month? (e.g., travel history, exposures)       denies  Protocols used: Cough - Acute Non-Productive-A-AH

## 2023-11-30 NOTE — Progress Notes (Signed)
 Subjective:    Patient ID: Mike Davila, male    DOB: Sep 30, 1946, 78 y.o.   MRN: [id number]  Cough   Patient states that he got sick with a cold approximately 1 week ago.  He has been coughing ever since.  He states that the cough is dry nonproductive.  He is coughed so hard that he hurts in his ribs bilaterally.  He also hears himself wheezing and feels short of breath.  He denies any fevers or chills or purulent sputum.  Today on exam, the patient has diffuse expiratory wheezing in all 4 lung fields with diminished breath sounds bilaterally. Past Medical History:  Diagnosis Date   Anemia    Benign localized prostatic hyperplasia with lower urinary tract symptoms (LUTS)    CKD (chronic kidney disease), stage III [facility])    nephrologist-  dr edwardo ([facility] in [place])-- per pt last visit 06/ 2019   Diverticulosis of colon    ED (erectile dysfunction)    History of acute gouty arthritis 10/23/2017   History of acute renal failure 10/23/2017   hospital admission-- w/ acute tubular necrosis superimposed on ckd 3--- resolved   History of diverticulitis of colon 10/05/2017   admission-- mild-- resolved w/ medical management   History of metabolic acidosis 10/23/2017   admission-- resolved   History of palpitations    approx. 2009, per pt no issue since   History of urinary retention 10/2017   Hypertension    Incomplete right bundle branch block    Mixed hyperlipidemia    Nephrolithiasis    left side nonobstructive   Pre-diabetes    Rheumatoid arthritis (HCC)    Right ureteral stone    Wears dentures    upper    Past Surgical History:  Procedure Laterality Date   ANKLE SURGERY Bilateral 1990s to early 2000s   removal bone spurs   APPENDECTOMY  child   COLONOSCOPY N/A 10/27/2017   Procedure: COLONOSCOPY;  Surgeon: Golda Claudis PENNER, MD;  [facility];  Service: Endoscopy;  Laterality: N/A;   CYSTOSCOPY/URETEROSCOPY/HOLMIUM LASER/STENT PLACEMENT Right 05/09/2018    Procedure: CYSTOSCOPY RIGHT URETEROSCOPY/HOLMIUM LASER/STENT PLACEMENT;  Surgeon: Cam Morene ORN, MD;  Location: [facility];  Service: Urology;  Laterality: Right;   HEMATOMA EVACUATION  1990s   right calf   INGUINAL HERNIA REPAIR Bilateral age 17s   PLANTAR FASCIA SURGERY Left 2004   TONSILLECTOMY  child   TRANSTHORACIC ECHOCARDIOGRAM  10/24/2017   ef 60-65%,  grade 1 diastolic dysfunction   Current Outpatient Medications on File Prior to Visit  Medication Sig Dispense Refill   Accu-Chek Softclix Lancets lancets Check bs bid DX: R73.03 100 each 12   amLODipine  (NORVASC ) 5 MG tablet TAKE 1 TABLET(5 MG) BY MOUTH IN THE MORNING AND AT BEDTIME 180 tablet 3   ascorbic acid  (VITAMIN C) 500 MG tablet Take 1 tablet (500 mg total) by mouth daily. 30 tablet 1   Blood Glucose Monitoring Suppl (ACCU-CHEK GUIDE) w/Device KIT 1 Units by Does not apply route in the morning and at bedtime. 1 kit 0   ezetimibe  (ZETIA ) 10 MG tablet Take 1 tablet (10 mg total) by mouth daily. 90 tablet 3   glucose blood (ACCU-CHEK GUIDE) test strip Check bs bid DX: R73.03 100 each 3   Lancets Misc. (ACCU-CHEK SOFTCLIX LANCET DEV) KIT Check bs bid DX: R73.03 1 kit 0   metoprolol  succinate (TOPROL -XL) 50 MG 24 hr tablet TAKE 1 TABLET(50 MG) BY MOUTH DAILY WITH OR  IMMEDIATELY FOLLOWING A MEAL 90 tablet 3   Multiple Vitamin (MULTIVITAMIN WITH MINERALS) TABS tablet Take 1 tablet by mouth daily. 30 tablet 1   OXYGEN  Inhale 3 L into the lungs. Continous     valsartan  (DIOVAN ) 160 MG tablet Take 1 tablet (160 mg total) by mouth 2 (two) times daily. 180 tablet 3   zolpidem  (AMBIEN ) 5 MG tablet TAKE 1 TABLET(5 MG) BY MOUTH AT BEDTIME AS NEEDED FOR SLEEP 30 tablet 3   zolpidem  (AMBIEN ) 5 MG tablet Take 1 tablet (5 mg total) by mouth at bedtime as needed for sleep. 30 tablet 2   No current facility-administered medications on file prior to visit.   Allergies  Allergen Reactions   Sulfa Antibiotics Hives and  Shortness Of Breath   Crestor [Rosuvastatin Calcium ] Other (See Comments)    Leg cramps   Lipitor [Atorvastatin  Calcium ] Other (See Comments)    Leg cramps   Tetanus Toxoids Other (See Comments)    Unknown reaction   Social History   Socioeconomic History   Marital status: Married    Spouse name: Not on file   Number of children: Not on file   Years of education: Not on file   Highest education level: 12th grade  Occupational History   Not on file  Tobacco Use   Smoking status: Former    Current packs/day: 0.00    Types: Cigarettes    Start date: 05/04/1986    Quit date: 05/04/2006    Years since quitting: 17.5   Smokeless tobacco: Former    Types: Chew    Quit date: 05/04/2017  Vaping Use   Vaping status: Never Used  Substance and Sexual Activity   Alcohol use: No   Drug use: No   Sexual activity: Yes  Other Topics Concern   Not on file  Social History Narrative   Married.   [employer].   Social Drivers of Corporate Investment Banker Strain: Low Risk  (11/30/2023)   Overall Financial Resource Strain (CARDIA)    Difficulty of Paying Living Expenses: Not hard at all  Food Insecurity: No Food Insecurity (11/30/2023)   Hunger Vital Sign    Worried About Running Out of Food in the Last Year: Never true    Ran Out of Food in the Last Year: Never true  Transportation Needs: No Transportation Needs (11/30/2023)   PRAPARE - Administrator, Civil Service (Medical): No    Lack of Transportation (Non-Medical): No  Physical Activity: Unknown (11/30/2023)   Exercise Vital Sign    Days of Exercise per Week: 0 days    Minutes of Exercise per Session: Not on file  Stress: No Stress Concern Present (11/30/2023)   [employer] of Occupational Health - Occupational Stress Questionnaire    Feeling of Stress : Not at all  Social Connections: Moderately Isolated (11/30/2023)   Social Connection and Isolation Panel [NHANES]    Frequency of Communication with Friends and  Family: More than three times a week    Frequency of Social Gatherings with Friends and Family: Patient declined    Attends Religious Services: Never    Database Administrator or Organizations: No    Attends Banker Meetings: Not on file    Marital Status: Married  Intimate Partner Violence: Not At Risk (01/09/2023)   [facility]   HITS    Over the last 12 months how often did your partner physically hurt you?: Never  Over the last 12 months how often did your partner insult you or talk down to you?: Never    Over the last 12 months how often did your partner threaten you with physical harm?: Never    Over the last 12 months how often did your partner scream or curse at you?: Never   Family History  Problem Relation Age of Onset   Thyroid  cancer Mother    Heart disease Father        MI   Colon cancer Father    Melanoma Father    Benign prostatic hyperplasia Brother    Diabetes Mellitus II Maternal Grandmother    Ovarian cancer Daughter     Review of Systems  Respiratory:  Positive for cough.        Objective:   Physical Exam Vitals reviewed.  Constitutional:      General: He is not in acute distress.    Appearance: Normal appearance. He is normal weight. He is not ill-appearing or toxic-appearing.  Cardiovascular:     Rate and Rhythm: Normal rate and regular rhythm.     Heart sounds: Normal heart sounds. No murmur heard.    No gallop.  Pulmonary:     Effort: Pulmonary effort is normal. No tachypnea or respiratory distress.     Breath sounds: Decreased air movement present. No stridor. Decreased breath sounds and wheezing present. No rhonchi or rales.  Skin:    Findings: No rash.  Neurological:     General: No focal deficit present.     Mental Status: He is alert and oriented to person, place, and time. Mental status is at baseline.     Cranial Nerves: No cranial nerve deficit.        Assessment & Plan:  Bronchitis  with bronchospasm There is no evidence of fluid on exam today.  He is not fluid overloaded.  The patient is having bronchitis with bronchospasm.  He has a history of smoking and also suffered lung damage from COVID in 2021.  I believe he likely has underlying emphysema.  I believe the recent viral upper respiratory infection has triggered his ablation.  Begin prednisone  40 mg daily for 7 days and use albuterol  2 puffs every 6 hours as needed.  If he develops fevers or chills or purulent sputum I want him to add a Z-Pak.  I gave him a prescription but I gave him strict instructions not to fill it yet

## 2023-12-11 ENCOUNTER — Other Ambulatory Visit: Payer: Self-pay | Admitting: Family Medicine

## 2023-12-11 DIAGNOSIS — I10 Essential (primary) hypertension: Secondary | ICD-10-CM

## 2024-01-02 ENCOUNTER — Other Ambulatory Visit: Payer: Self-pay | Admitting: Family Medicine

## 2024-01-02 DIAGNOSIS — G479 Sleep disorder, unspecified: Secondary | ICD-10-CM

## 2024-01-03 DIAGNOSIS — Z8739 Personal history of other diseases of the musculoskeletal system and connective tissue: Secondary | ICD-10-CM | POA: Diagnosis not present

## 2024-01-03 DIAGNOSIS — Z79899 Other long term (current) drug therapy: Secondary | ICD-10-CM | POA: Diagnosis not present

## 2024-01-03 DIAGNOSIS — I10 Essential (primary) hypertension: Secondary | ICD-10-CM | POA: Diagnosis not present

## 2024-01-03 DIAGNOSIS — N189 Chronic kidney disease, unspecified: Secondary | ICD-10-CM | POA: Diagnosis not present

## 2024-01-03 DIAGNOSIS — E782 Mixed hyperlipidemia: Secondary | ICD-10-CM | POA: Diagnosis not present

## 2024-01-03 DIAGNOSIS — M0579 Rheumatoid arthritis with rheumatoid factor of multiple sites without organ or systems involvement: Secondary | ICD-10-CM | POA: Diagnosis not present

## 2024-01-03 NOTE — Telephone Encounter (Signed)
 Requested medication (s) are due for refill today: yes  Requested medication (s) are on the active medication list: yes  Last refill:  06/12/23 #30/3  Future visit scheduled: no  Notes to clinic:  Unable to refill per protocol, cannot delegate.      Requested Prescriptions  Pending Prescriptions Disp Refills   zolpidem (AMBIEN) 5 MG tablet [Pharmacy Med Name: ZOLPIDEM 5MG  TABLETS] 30 tablet     Sig: TAKE 1 TABLET(5 MG) BY MOUTH AT BEDTIME AS NEEDED FOR SLEEP     Not Delegated - Psychiatry:  Anxiolytics/Hypnotics Failed - 01/03/2024  1:23 PM      Failed - This refill cannot be delegated      Failed - Urine Drug Screen completed in last 360 days      Failed - Valid encounter within last 6 months    Recent Outpatient Visits           1 year ago Polyarthralgia   [facility] Donita Brooks, MD   2 years ago Vasculitis [facility])   [facility] Pickard, Priscille Heidelberg, MD   2 years ago Primary hypertension   [facility] Donita Brooks, MD   2 years ago Muscle weakness (generalized)   [facility] Pickard, Priscille Heidelberg, MD   2 years ago Prediabetes   [facility] Pickard, Priscille Heidelberg, MD

## 2024-01-05 ENCOUNTER — Other Ambulatory Visit

## 2024-01-05 DIAGNOSIS — M059 Rheumatoid arthritis with rheumatoid factor, unspecified: Secondary | ICD-10-CM

## 2024-01-06 LAB — RHEUMATOID FACTOR: Rheumatoid fact SerPl-aCnc: 70 [IU]/mL — ABNORMAL HIGH (ref <14)

## 2024-01-06 LAB — SEDIMENTATION RATE: Sed Rate: 14 mm/h (ref 0–20)

## 2024-01-10 ENCOUNTER — Other Ambulatory Visit: Payer: Self-pay | Admitting: Family Medicine

## 2024-01-10 DIAGNOSIS — I10 Essential (primary) hypertension: Secondary | ICD-10-CM

## 2024-01-11 ENCOUNTER — Telehealth: Payer: Self-pay

## 2024-01-11 ENCOUNTER — Other Ambulatory Visit: Payer: Self-pay

## 2024-01-11 DIAGNOSIS — I10 Essential (primary) hypertension: Secondary | ICD-10-CM

## 2024-01-11 MED ORDER — VALSARTAN 160 MG PO TABS: 160.0000 mg | ORAL_TABLET | Freq: Two times a day (BID) | ORAL | 2 refills | Status: DC

## 2024-01-11 NOTE — Telephone Encounter (Signed)
 OV 04/24/23, 11/30/23 -acute Last RF has notes- courtesy RF Requested Prescriptions  Pending Prescriptions Disp Refills   valsartan (DIOVAN) 160 MG tablet [Pharmacy Med Name: VALSARTAN 160MG  TABLETS] 60 tablet 0    Sig: TAKE 1 TABLET(160 MG) BY MOUTH TWICE DAILY     Cardiovascular:  Angiotensin Receptor Blockers Failed - 01/11/2024  4:03 PM      Failed - Cr in normal range and within 180 days    Creat  Date Value Ref Range Status  08/01/2023 2.06 (H) 0.70 - 1.28 mg/dL Final   Creatinine, Urine  Date Value Ref Range Status  07/14/2020 107 20 - 320 mg/dL Final         Failed - Valid encounter within last 6 months    Recent Outpatient Visits           1 year ago Polyarthralgia   [facility] Donita Brooks, MD   2 years ago Vasculitis [facility])   [facility] Pickard, Priscille Heidelberg, MD   2 years ago Primary hypertension   [facility] Tanya Nones, Priscille Heidelberg, MD   2 years ago Muscle weakness (generalized)   [facility] Donita Brooks, MD   3 years ago Prediabetes   [facility] Tanya Nones, Priscille Heidelberg, MD              Passed - K in normal range and within 180 days    Potassium  Date Value Ref Range Status  08/01/2023 4.0 3.5 - 5.3 mmol/L Final         Passed - Patient is not pregnant      Passed - Last BP in normal range    BP Readings from Last 1 Encounters:  11/30/23 118/66

## 2024-01-11 NOTE — Telephone Encounter (Signed)
 Copied from CRM [id number]. Topic: General - Other >> Jan 11, 2024  4:13 PM Emylou G wrote: Reason for CRM: patient called checking status of Refused Valsartan .[facility] [place].. he says would like 90 day fill if possible

## 2024-03-27 DIAGNOSIS — N189 Chronic kidney disease, unspecified: Secondary | ICD-10-CM | POA: Diagnosis not present

## 2024-03-27 DIAGNOSIS — E782 Mixed hyperlipidemia: Secondary | ICD-10-CM | POA: Diagnosis not present

## 2024-03-27 DIAGNOSIS — I10 Essential (primary) hypertension: Secondary | ICD-10-CM | POA: Diagnosis not present

## 2024-03-27 DIAGNOSIS — Z79899 Other long term (current) drug therapy: Secondary | ICD-10-CM | POA: Diagnosis not present

## 2024-03-27 DIAGNOSIS — Z8739 Personal history of other diseases of the musculoskeletal system and connective tissue: Secondary | ICD-10-CM | POA: Diagnosis not present

## 2024-03-27 DIAGNOSIS — M0579 Rheumatoid arthritis with rheumatoid factor of multiple sites without organ or systems involvement: Secondary | ICD-10-CM | POA: Diagnosis not present

## 2024-04-17 ENCOUNTER — Other Ambulatory Visit: Payer: Self-pay | Admitting: Family Medicine

## 2024-04-30 ENCOUNTER — Other Ambulatory Visit: Payer: Self-pay | Admitting: Family Medicine

## 2024-04-30 DIAGNOSIS — G479 Sleep disorder, unspecified: Secondary | ICD-10-CM

## 2024-07-13 ENCOUNTER — Other Ambulatory Visit: Payer: Self-pay | Admitting: Family Medicine

## 2024-07-13 DIAGNOSIS — I10 Essential (primary) hypertension: Secondary | ICD-10-CM

## 2024-07-15 ENCOUNTER — Other Ambulatory Visit: Payer: Self-pay | Admitting: Family Medicine

## 2024-07-15 DIAGNOSIS — I10 Essential (primary) hypertension: Secondary | ICD-10-CM

## 2024-07-15 NOTE — Telephone Encounter (Unsigned)
 Copied from CRM #[id number]. Topic: Clinical - Medication Refill >> Jul 15, 2024  4:28 PM Wess RAMAN wrote: Medication: valsartan  (DIOVAN ) 160 MG tablet   Has the patient contacted their pharmacy? Yes (Agent: If no, request that the patient contact the pharmacy for the refill. If patient does not wish to contact the pharmacy document the reason why and proceed with request.) (Agent: If yes, when and what did the pharmacy advise?) Doctor must contact pharmacy  This is the patient's preferred pharmacy:  [facility] [phone] - [place], [place] - [address] AT [facility] [address]  [place] [address] Phone: [phone] Fax: [phone]  Is this the correct pharmacy for this prescription? Yes If no, delete pharmacy and type the correct one.   Has the prescription been filled recently? Yes  Is the patient out of the medication? No  Has the patient been seen for an appointment in the last year OR does the patient have an upcoming appointment? Yes  Can we respond through MyChart? Yes  Agent: Please be advised that Rx refills may take up to 3 business days. We ask that you follow-up with your pharmacy.

## 2024-07-15 NOTE — Telephone Encounter (Signed)
 Too soon for refill, LRF 01/11/24 FOR 90 AND 2 RF.  Requested Prescriptions  Pending Prescriptions Disp Refills   valsartan  (DIOVAN ) 160 MG tablet [Pharmacy Med Name: VALSARTAN  160MG  TABLETS] 180 tablet 2    Sig: TAKE 1 TABLET(160 MG) BY MOUTH TWICE DAILY     Cardiovascular:  Angiotensin Receptor Blockers Failed - 07/15/2024 12:50 PM      Failed - Cr in normal range and within 180 days    Creat  Date Value Ref Range Status  08/01/2023 2.06 (H) 0.70 - 1.28 mg/dL Final   Creatinine, Urine  Date Value Ref Range Status  07/14/2020 107 20 - 320 mg/dL Final         Failed - K in normal range and within 180 days    Potassium  Date Value Ref Range Status  08/01/2023 4.0 3.5 - 5.3 mmol/L Final         Failed - Valid encounter within last 6 months    Recent Outpatient Visits           7 months ago Bronchitis with bronchospasm   [facility] Duanne Butler DASEN, MD   1 year ago Rheumatoid arthritis with positive rheumatoid factor, involving unspecified site [facility])   [facility] Duanne Butler DASEN, MD   1 year ago Rheumatoid arthritis with positive rheumatoid factor, involving unspecified site [facility])   [facility] Pickard, Butler DASEN, MD   1 year ago Primary hypertension   [facility] Kayla Jeoffrey RAMAN, FNP   2 years ago Dyspnea, unspecified type   [facility] Pickard, Butler DASEN, MD              Passed - Patient is not pregnant      Passed - Last BP in normal range    BP Readings from Last 1 Encounters:  11/30/23 118/66

## 2024-07-16 MED ORDER — VALSARTAN 160 MG PO TABS: 160.0000 mg | ORAL_TABLET | Freq: Two times a day (BID) | ORAL | 2 refills | Status: AC

## 2024-07-16 NOTE — Telephone Encounter (Signed)
 Requested by patient.  Requested Prescriptions  Pending Prescriptions Disp Refills   valsartan  (DIOVAN ) 160 MG tablet 180 tablet 2    Sig: Take 1 tablet (160 mg total) by mouth 2 (two) times daily.     Cardiovascular:  Angiotensin Receptor Blockers Failed - 07/16/2024  3:06 PM      Failed - Cr in normal range and within 180 days    Creat  Date Value Ref Range Status  08/01/2023 2.06 (H) 0.70 - 1.28 mg/dL Final   Creatinine, Urine  Date Value Ref Range Status  07/14/2020 107 20 - 320 mg/dL Final         Failed - K in normal range and within 180 days    Potassium  Date Value Ref Range Status  08/01/2023 4.0 3.5 - 5.3 mmol/L Final         Failed - Valid encounter within last 6 months    Recent Outpatient Visits           7 months ago Bronchitis with bronchospasm   [facility] Duanne Butler DASEN, MD   1 year ago Rheumatoid arthritis with positive rheumatoid factor, involving unspecified site [facility])   [place] [facility] Duanne Butler DASEN, MD   1 year ago Rheumatoid arthritis with positive rheumatoid factor, involving unspecified site [facility])   [facility] Pickard, Butler DASEN, MD   1 year ago Primary hypertension   [place] Kayla Jeoffrey RAMAN, FNP   2 years ago Dyspnea, unspecified type   [facility] Pickard, Butler DASEN, MD              Passed - Patient is not pregnant      Passed - Last BP in normal range    BP Readings from Last 1 Encounters:  11/30/23 118/66

## 2024-08-09 ENCOUNTER — Telehealth: Payer: Self-pay

## 2024-08-09 NOTE — Telephone Encounter (Signed)
 Oxygen  Therapy SWO from [facility] faxed in to office on 08/06/2024 signed and completed by Dr. Duanne on 08/08/2024. Faxed back to [facility] at  Phone [phone] Fax: [phone] 08/09/2024 SRP, CMA

## 2024-08-24 ENCOUNTER — Other Ambulatory Visit: Payer: Self-pay | Admitting: Family Medicine

## 2024-08-24 DIAGNOSIS — G479 Sleep disorder, unspecified: Secondary | ICD-10-CM

## 2024-08-26 ENCOUNTER — Telehealth: Payer: Self-pay

## 2024-08-26 ENCOUNTER — Other Ambulatory Visit: Payer: Self-pay | Admitting: Family Medicine

## 2024-08-26 DIAGNOSIS — I10 Essential (primary) hypertension: Secondary | ICD-10-CM

## 2024-08-26 DIAGNOSIS — G479 Sleep disorder, unspecified: Secondary | ICD-10-CM

## 2024-08-26 MED ORDER — ZOLPIDEM TARTRATE 5 MG PO TABS: 5.0000 mg | ORAL_TABLET | Freq: Every evening | ORAL | 3 refills | Status: AC | PRN

## 2024-08-26 NOTE — Telephone Encounter (Signed)
 2cd Refill Request: Prescription Request  08/26/2024  LOV: 11/30/23  What is the name of the medication or equipment? zolpidem  (AMBIEN ) 5 MG tablet [[id number]]   Have you contacted your pharmacy to request a refill? Yes   Which pharmacy would you like this sent to?  [facility] [phone] - [place], [place] - [address] AT [facility] [address] [place] [address] Phone: [phone] Fax: [phone]    Patient notified that their request is being sent to the clinical staff for review and that they should receive a response within 2 business days.   Please advise at [facility] [phone]

## 2024-08-26 NOTE — Telephone Encounter (Signed)
 Requested medications are due for refill today.  no  Requested medications are on the active medications list.  yes  Last refill. 08/26/2024  Future visit scheduled.     Notes to clinic.  Refusal not delegated.    Requested Prescriptions  Pending Prescriptions Disp Refills   zolpidem  (AMBIEN ) 5 MG tablet [Pharmacy Med Name: ZOLPIDEM  5MG  TABLETS] 30 tablet     Sig: TAKE 1 TABLET(5 MG) BY MOUTH AT BEDTIME AS NEEDED FOR SLEEP     Not Delegated - Psychiatry:  Anxiolytics/Hypnotics Failed - 08/26/2024  5:58 PM      Failed - This refill cannot be delegated      Failed - Urine Drug Screen completed in last 360 days      Failed - Valid encounter within last 6 months    Recent Outpatient Visits           9 months ago Bronchitis with bronchospasm   [facility] Duanne Butler DASEN, MD   1 year ago Rheumatoid arthritis with positive rheumatoid factor, involving unspecified site [facility])   [place] Duanne Butler DASEN, MD   1 year ago Rheumatoid arthritis with positive rheumatoid factor, involving unspecified site [facility])   [place] [facility] Duanne Butler DASEN, MD   2 years ago Primary hypertension   [facility] Kayla Jeoffrey RAMAN, FNP   2 years ago Dyspnea, unspecified type   [facility] Pickard, Butler DASEN, MD

## 2024-09-30 ENCOUNTER — Encounter: Payer: Self-pay | Admitting: Family Medicine

## 2024-09-30 ENCOUNTER — Ambulatory Visit: Admitting: Family Medicine

## 2024-09-30 VITALS — BP 120/86 | HR 70 | Temp 97.6°F | Ht 66.0 in | Wt 199.0 lb

## 2024-09-30 DIAGNOSIS — I10 Essential (primary) hypertension: Secondary | ICD-10-CM

## 2024-09-30 DIAGNOSIS — H6121 Impacted cerumen, right ear: Secondary | ICD-10-CM | POA: Diagnosis not present

## 2024-09-30 DIAGNOSIS — R7303 Prediabetes: Secondary | ICD-10-CM

## 2024-09-30 NOTE — Progress Notes (Signed)
 Subjective:    Patient ID: Mike Davila, male    DOB: 10/06/1946, 78 y.o.   MRN: [id number]  Patient complains of decreased hearing in his right ear.  On examination today he has severely impacted cerumen in the right auditory canal.  He is requesting that we try to remove this today for him.  Of note he has a history of chronic kidney disease stage III.  He also has a history of prediabetes as well as hypertension.  His blood pressure today is well-controlled at 120/86.  He denies any chest pain or shortness of breath.  He is overdue for a flu shot but he declines this today.  He is also overdue for fasting labs.  He agrees to get this done today Past Medical History:  Diagnosis Date   Anemia    Benign localized prostatic hyperplasia with lower urinary tract symptoms (LUTS)    CKD (chronic kidney disease), stage III [facility])    nephrologist-  dr edwardo ([facility] in [place])-- per pt last visit 06/ 2019   Diverticulosis of colon    ED (erectile dysfunction)    History of acute gouty arthritis 10/23/2017   History of acute renal failure 10/23/2017   hospital admission-- w/ acute tubular necrosis superimposed on ckd 3--- resolved   History of diverticulitis of colon 10/05/2017   admission-- mild-- resolved w/ medical management   History of metabolic acidosis 10/23/2017   admission-- resolved   History of palpitations    approx. 2009, per pt no issue since   History of urinary retention 10/2017   Hypertension    Incomplete right bundle branch block    Mixed hyperlipidemia    Nephrolithiasis    left side nonobstructive   Pre-diabetes    Rheumatoid arthritis (HCC)    Right ureteral stone    Wears dentures    upper    Past Surgical History:  Procedure Laterality Date   ANKLE SURGERY Bilateral 1990s to early 2000s   removal bone spurs   APPENDECTOMY  child   COLONOSCOPY N/A 10/27/2017   Procedure: COLONOSCOPY;  Surgeon: Golda Claudis PENNER, MD;  [facility];  Service:  Endoscopy;  Laterality: N/A;   CYSTOSCOPY/URETEROSCOPY/HOLMIUM LASER/STENT PLACEMENT Right 05/09/2018   Procedure: CYSTOSCOPY RIGHT URETEROSCOPY/HOLMIUM LASER/STENT PLACEMENT;  Surgeon: Cam Morene ORN, MD;  [facility];  Service: Urology;  Laterality: Right;   HEMATOMA EVACUATION  1990s   right calf   INGUINAL HERNIA REPAIR Bilateral age 15s   PLANTAR FASCIA SURGERY Left 2004   TONSILLECTOMY  child   TRANSTHORACIC ECHOCARDIOGRAM  10/24/2017   ef 60-65%,  grade 1 diastolic dysfunction   Current Outpatient Medications on File Prior to Visit  Medication Sig Dispense Refill   Accu-Chek Softclix Lancets lancets Check bs bid DX: R73.03 100 each 12   albuterol  (VENTOLIN  HFA) 108 (90 Base) MCG/ACT inhaler Inhale 2 puffs into the lungs 3 (three) times daily. 8 g 1   amLODipine  (NORVASC ) 5 MG tablet TAKE 1 TABLET(5 MG) BY MOUTH IN THE MORNING AND AT BEDTIME 180 tablet 3   ascorbic acid  (VITAMIN C) 500 MG tablet Take 1 tablet (500 mg total) by mouth daily. 30 tablet 1   azithromycin  (ZITHROMAX ) 250 MG tablet 2 tabs poqday1, 1 tab poqday 2-5 6 tablet 0   Blood Glucose Monitoring Suppl (ACCU-CHEK GUIDE) w/Device KIT 1 Units by Does not apply route in the morning and at bedtime. 1 kit 0   ezetimibe  (ZETIA ) 10 MG tablet TAKE  1 TABLET(10 MG) BY MOUTH DAILY 90 tablet 3   glucose blood (ACCU-CHEK GUIDE) test strip Check bs bid DX: R73.03 100 each 3   Lancets Misc. (ACCU-CHEK SOFTCLIX LANCET DEV) KIT Check bs bid DX: R73.03 1 kit 0   metoprolol  succinate (TOPROL -XL) 50 MG 24 hr tablet TAKE 1 TABLET(50 MG) BY MOUTH DAILY WITH OR IMMEDIATELY FOLLOWING A MEAL 90 tablet 3   Multiple Vitamin (MULTIVITAMIN WITH MINERALS) TABS tablet Take 1 tablet by mouth daily. 30 tablet 1   OXYGEN  Inhale 3 L into the lungs. Continous     predniSONE  (DELTASONE ) 20 MG tablet Take 2 tablets (40 mg total) by mouth daily with breakfast. 14 tablet 0   valsartan  (DIOVAN ) 160 MG tablet Take 1 tablet (160 mg  total) by mouth 2 (two) times daily. 180 tablet 2   zolpidem  (AMBIEN ) 5 MG tablet Take 1 tablet (5 mg total) by mouth at bedtime as needed for sleep. 30 tablet 2   zolpidem  (AMBIEN ) 5 MG tablet Take 1 tablet (5 mg total) by mouth at bedtime as needed for sleep. 30 tablet 3   No current facility-administered medications on file prior to visit.   Allergies  Allergen Reactions   Sulfa Antibiotics Hives and Shortness Of Breath   Crestor [Rosuvastatin Calcium ] Other (See Comments)    Leg cramps   Lipitor [Atorvastatin  Calcium ] Other (See Comments)    Leg cramps   Tetanus Toxoid-Containing Vaccines Other (See Comments)    Unknown reaction   Social History   Socioeconomic History   Marital status: Married    Spouse name: Not on file   Number of children: Not on file   Years of education: Not on file   Highest education level: 12th grade  Occupational History   Not on file  Tobacco Use   Smoking status: Former    Current packs/day: 0.00    Types: Cigarettes    Start date: 05/04/1986    Quit date: 05/04/2006    Years since quitting: 18.4   Smokeless tobacco: Former    Types: Chew    Quit date: 05/04/2017  Vaping Use   Vaping status: Never Used  Substance and Sexual Activity   Alcohol use: No   Drug use: No   Sexual activity: Yes  Other Topics Concern   Not on file  Social History Narrative   Married.   [employer].   Social Drivers of Corporate Investment Banker Strain: Low Risk  (09/29/2024)   Overall Financial Resource Strain (CARDIA)    Difficulty of Paying Living Expenses: Not hard at all  Food Insecurity: No Food Insecurity (09/29/2024)   Hunger Vital Sign    Worried About Running Out of Food in the Last Year: Never true    Ran Out of Food in the Last Year: Never true  Transportation Needs: No Transportation Needs (09/29/2024)   PRAPARE - Administrator, Civil Service (Medical): No    Lack of Transportation (Non-Medical): No  Physical Activity: Inactive  (09/29/2024)   Exercise Vital Sign    Days of Exercise per Week: 0 days    Minutes of Exercise per Session: Not on file  Stress: No Stress Concern Present (09/29/2024)   [employer] of Occupational Health - Occupational Stress Questionnaire    Feeling of Stress: Not at all  Social Connections: Moderately Isolated (09/29/2024)   Social Connection and Isolation Panel    Frequency of Communication with Friends and Family: More than three times a week  Frequency of Social Gatherings with Friends and Family: Three times a week    Attends Religious Services: Patient declined    Active Member of Clubs or Organizations: No    Attends Banker Meetings: Not on file    Marital Status: Married  Intimate Partner Violence: Not At Risk (01/09/2023)   Received from [facility]   HITS    Over the last 12 months how often did your partner physically hurt you?: Never    Over the last 12 months how often did your partner insult you or talk down to you?: Never    Over the last 12 months how often did your partner threaten you with physical harm?: Never    Over the last 12 months how often did your partner scream or curse at you?: Never   Family History  Problem Relation Age of Onset   Thyroid  cancer Mother    Heart disease Father        MI   Colon cancer Father    Melanoma Father    Benign prostatic hyperplasia Brother    Diabetes Mellitus II Maternal Grandmother    Ovarian cancer Daughter     Review of Systems  Respiratory:  Positive for cough.        Objective:   Physical Exam Vitals reviewed.  Constitutional:      General: He is not in acute distress.    Appearance: Normal appearance. He is normal weight. He is not ill-appearing or toxic-appearing.  HENT:     Right Ear: There is impacted cerumen.     Left Ear: There is no impacted cerumen.  Cardiovascular:     Rate and Rhythm: Normal rate and regular rhythm.     Heart sounds: Normal heart sounds. No murmur heard.     No gallop.  Pulmonary:     Effort: Pulmonary effort is normal. No tachypnea or respiratory distress.     Breath sounds: No stridor or decreased air movement. No decreased breath sounds, wheezing, rhonchi or rales.  Skin:    Findings: No rash.  Neurological:     General: No focal deficit present.     Mental Status: He is alert and oriented to person, place, and time. Mental status is at baseline.     Cranial Nerves: No cranial nerve deficit.        Assessment & Plan:  Prediabetes - Plan: CBC with Differential/Platelet, Comprehensive metabolic panel with GFR, Lipid panel, Hemoglobin A1c, Microalbumin / creatinine urine ratio  Primary hypertension - Plan: CBC with Differential/Platelet, Comprehensive metabolic panel with GFR, Lipid panel, Hemoglobin A1c, Microalbumin / creatinine urine ratio Patient's blood pressure today is well-controlled.  I will check a CBC CMP lipid panel and A1c and a urine microalbumin to creatinine ratio.  I would like to see his LDL cholesterol less than 899.  I would like to see his A1c less than 6.5.  I would like to see his microalbumin creatinine ratio less than 30.  I offered the patient a flu shot which he politely declined.  He does have a cerumen impaction in his right auditory canal.  My nurse attempted to remove this with irrigation and lavage.  The first attempt was unsuccessful.  I then tried to grasp the cerumen with a pair of alligator forceps but I too was unsuccessful as it continued to break apart in small chunks and was causing the patient significant pain.  We then tried irrigation and lavage again.  However we were causing  the patient's severe discomfort.  Therefore I will consult ENT to try to have the remaining wax removed with vacuum suction

## 2024-10-01 ENCOUNTER — Ambulatory Visit: Payer: Self-pay | Admitting: Family Medicine

## 2024-10-01 LAB — COMPREHENSIVE METABOLIC PANEL WITH GFR
AG Ratio: 1.8 (calc) (ref 1.0–2.5)
ALT: 13 U/L (ref 9–46)
AST: 14 U/L (ref 10–35)
Albumin: 4.6 g/dL (ref 3.6–5.1)
Alkaline phosphatase (APISO): 63 U/L (ref 35–144)
BUN/Creatinine Ratio: 18 (calc) (ref 6–22)
BUN: 43 mg/dL — ABNORMAL HIGH (ref 7–25)
CO2: 20 mmol/L (ref 20–32)
Calcium: 9.6 mg/dL (ref 8.6–10.3)
Chloride: 107 mmol/L (ref 98–110)
Creat: 2.39 mg/dL — ABNORMAL HIGH (ref 0.70–1.28)
Globulin: 2.5 g/dL (ref 1.9–3.7)
Glucose, Bld: 83 mg/dL (ref 65–99)
Potassium: 5.2 mmol/L (ref 3.5–5.3)
Sodium: 137 mmol/L (ref 135–146)
Total Bilirubin: 0.5 mg/dL (ref 0.2–1.2)
Total Protein: 7.1 g/dL (ref 6.1–8.1)
eGFR: 27 mL/min/1.73m2 — ABNORMAL LOW (ref >=60)

## 2024-10-01 LAB — CBC WITH DIFFERENTIAL/PLATELET
Absolute Lymphocytes: 1447 {cells}/uL (ref 850–3900)
Absolute Monocytes: 734 {cells}/uL (ref 200–950)
Basophils Absolute: 58 {cells}/uL (ref 0–200)
Basophils Relative: 0.8 %
Eosinophils Absolute: 151 {cells}/uL (ref 15–500)
Eosinophils Relative: 2.1 %
HCT: 45 % (ref 39.4–51.1)
Hemoglobin: 14.4 g/dL (ref 13.2–17.1)
MCH: 27.3 pg (ref 27.0–33.0)
MCHC: 32 g/dL (ref 31.6–35.4)
MCV: 85.4 fL (ref 81.4–101.7)
MPV: 10.3 fL (ref 7.5–12.5)
Monocytes Relative: 10.2 %
Neutro Abs: 4810 {cells}/uL (ref 1500–7800)
Neutrophils Relative %: 66.8 %
Platelets: 223 Thousand/uL (ref 140–400)
RBC: 5.27 Million/uL (ref 4.20–5.80)
RDW: 14.9 % (ref 11.0–15.0)
Total Lymphocyte: 20.1 %
WBC: 7.2 Thousand/uL (ref 3.8–10.8)

## 2024-10-01 LAB — LIPID PANEL
Cholesterol: 174 mg/dL (ref <200)
HDL: 40 mg/dL (ref >=40)
LDL Cholesterol (Calc): 92 mg/dL
Non-HDL Cholesterol (Calc): 134 mg/dL — ABNORMAL HIGH (ref <130)
Total CHOL/HDL Ratio: 4.4 (calc) (ref <5.0)
Triglycerides: 316 mg/dL — ABNORMAL HIGH (ref <150)

## 2024-10-01 LAB — MICROALBUMIN / CREATININE URINE RATIO
Creatinine, Urine: 82 mg/dL (ref 20–320)
Microalb Creat Ratio: 34 mg/g{creat} — ABNORMAL HIGH (ref <30)
Microalb, Ur: 2.8 mg/dL

## 2024-10-01 LAB — HEMOGLOBIN A1C
Hgb A1c MFr Bld: 5.8 % — ABNORMAL HIGH (ref <5.7)
Mean Plasma Glucose: 120 mg/dL
eAG (mmol/L): 6.6 mmol/L

## 2024-10-09 ENCOUNTER — Encounter (INDEPENDENT_AMBULATORY_CARE_PROVIDER_SITE_OTHER): Payer: Self-pay

## 2024-10-14 ENCOUNTER — Other Ambulatory Visit: Payer: Self-pay | Admitting: Family Medicine

## 2024-10-14 DIAGNOSIS — I10 Essential (primary) hypertension: Secondary | ICD-10-CM

## 2024-10-30 ENCOUNTER — Other Ambulatory Visit: Payer: Self-pay | Admitting: Family Medicine

## 2024-10-30 DIAGNOSIS — I10 Essential (primary) hypertension: Secondary | ICD-10-CM

## 2024-11-01 ENCOUNTER — Encounter (INDEPENDENT_AMBULATORY_CARE_PROVIDER_SITE_OTHER): Payer: Self-pay | Admitting: Physician Assistant

## 2024-11-01 ENCOUNTER — Ambulatory Visit (INDEPENDENT_AMBULATORY_CARE_PROVIDER_SITE_OTHER): Admitting: Physician Assistant

## 2024-11-01 VITALS — BP 122/78 | HR 69 | Temp 98.0°F | Ht 65.0 in | Wt 191.0 lb

## 2024-11-01 DIAGNOSIS — H7291 Unspecified perforation of tympanic membrane, right ear: Secondary | ICD-10-CM

## 2024-11-01 DIAGNOSIS — H6123 Impacted cerumen, bilateral: Secondary | ICD-10-CM | POA: Diagnosis not present

## 2024-11-01 DIAGNOSIS — H6121 Impacted cerumen, right ear: Secondary | ICD-10-CM

## 2024-11-01 MED ORDER — OFLOXACIN 0.3 % OT SOLN: 5.0000 [drp] | Freq: Every day | OTIC | 0 refills | Status: AC

## 2024-11-01 MED ORDER — OFLOXACIN 0.3 % OT SOLN: 5.0000 [drp] | Freq: Every day | OTIC | 0 refills | Status: DC

## 2024-11-01 NOTE — Progress Notes (Signed)
 Dear Dr. Duanne, Here is my assessment for our mutual patient, Mike Davila. Thank you for allowing me the opportunity to care for your patient. Please do not hesitate to contact me should you have any other questions. Sincerely, Chyrl Cohen PA-C  Otolaryngology Clinic Note Referring provider: Dr. Duanne HPI:  Mike Davila is a 79 y.o. male kindly referred by Dr. Duanne   Discussed the use of AI scribe software for clinical note transcription with the patient, who gave verbal consent to proceed.  History of Present Illness   Mike Davila is a 79 year old male with hearing loss who presents with right otalgia and bilateral cerumen impaction.  He reports complete right aural obstruction and anacusis for approximately ten days, with progressive otalgia and associated jaw discomfort. Communication has become difficult, requiring his companion to speak loudly.  Attempts at right cerumen removal by his primary care provider exacerbated his symptoms. He recalls a prior episode twenty years ago when cerumen removal by an otolaryngologist resulted in significant otorrhagia and skin trauma, leading him to avoid further procedures and request minimal pressure during removal.  He uses hearing aids and estimates a significant degree of hearing loss. He has not worn the right hearing aid recently due to pain with insertion, including discomfort during fitting. The left hearing aid was effective until three days ago, when he noted decreased hearing despite increased volume, which he attributes to cerumen.  He describes chronic right aural irritation and tenderness, especially with pressure near the tympanic membrane. The only episode of otorrhagia occurred during the prior cerumen removal twenty years ago.  He has involuntary head tremors that cause head movement during procedures. He denies vertigo or presyncope.           Independent Review of Additional Tests or Records:   none   PMH/Meds/All/SocHx/FamHx/ROS:   Past Medical History:  Diagnosis Date   Anemia    Benign localized prostatic hyperplasia with lower urinary tract symptoms (LUTS)    CKD (chronic kidney disease), stage III [facility])    nephrologist-  dr edwardo ([facility] in [place])-- per pt last visit 06/ 2019   Diverticulosis of colon    ED (erectile dysfunction)    History of acute gouty arthritis 10/23/2017   History of acute renal failure 10/23/2017   hospital admission-- w/ acute tubular necrosis superimposed on ckd 3--- resolved   History of diverticulitis of colon 10/05/2017   admission-- mild-- resolved w/ medical management   History of metabolic acidosis 10/23/2017   admission-- resolved   History of palpitations    approx. 2009, per pt no issue since   History of urinary retention 10/2017   Hypertension    Incomplete right bundle branch block    Mixed hyperlipidemia    Nephrolithiasis    left side nonobstructive   Pre-diabetes    Rheumatoid arthritis (HCC)    Right ureteral stone    Wears dentures    upper     Past Surgical History:  Procedure Laterality Date   ANKLE SURGERY Bilateral 1990s to early 2000s   removal bone spurs   APPENDECTOMY  child   COLONOSCOPY N/A 10/27/2017   Procedure: COLONOSCOPY;  Surgeon: Golda Claudis PENNER, MD;  [facility];  Service: Endoscopy;  Laterality: N/A;   CYSTOSCOPY/URETEROSCOPY/HOLMIUM LASER/STENT PLACEMENT Right 05/09/2018   Procedure: CYSTOSCOPY RIGHT URETEROSCOPY/HOLMIUM LASER/STENT PLACEMENT;  Surgeon: Cam Morene ORN, MD;  Location: [facility];  Service: Urology;  Laterality: Right;   HEMATOMA EVACUATION  1990s  right calf   INGUINAL HERNIA REPAIR Bilateral age 89s   PLANTAR FASCIA SURGERY Left 2004   TONSILLECTOMY  child   TRANSTHORACIC ECHOCARDIOGRAM  10/24/2017   ef 60-65%,  grade 1 diastolic dysfunction    Family History  Problem Relation Age of Onset   Thyroid  cancer Mother    Heart  disease Father        MI   Colon cancer Father    Melanoma Father    Benign prostatic hyperplasia Brother    Diabetes Mellitus II Maternal Grandmother    Ovarian cancer Daughter      Social Connections: Moderately Isolated (09/29/2024)   Social Connection and Isolation Panel    Frequency of Communication with Friends and Family: More than three times a week    Frequency of Social Gatherings with Friends and Family: Three times a week    Attends Religious Services: Patient declined    Active Member of Clubs or Organizations: No    Attends Engineer, Structural: Not on file    Marital Status: Married     Current Medications[1]   Physical Exam:   BP 122/78   Pulse 69   Temp 98 F (36.7 C)   Ht 5' 5 (1.651 m)   Wt 191 lb (86.6 kg)   SpO2 92%   BMI 31.78 kg/m   Pertinent Findings  CN II-XII grossly intact Right EAC with cerumen impaction, once removed some residual wax along the TM chronic appearing perforation along the anterior middle TM approximately 15%, no drainage, left EAC with cerumen impaction unable to visualize TM  Anterior rhinoscopy: Septum midline; bilateral inferior turbinates with no hypertrophy No lesions of oral cavity/oropharynx;  No obviously palpable neck masses/lymphadenopathy/thyromegaly No respiratory distress or stridor      Seprately Identifiable Procedures:  Procedure: bilateral ear microscopy and cerumen removal using microscope (CPT [phone]) - Mod 25 Pre-procedure diagnosis: unilateral cerumen impaction right external auditory canal Post-procedure diagnosis: same Indication: unilateral  cerumen impaction; given patient's otologic complaints and history as well as for improved and comprehensive examination of external ear and tympanic membrane, bilateral otologic examination using microscope was performed and impacted cerumen removed  Procedure: Patient was placed semi-recumbent. Both ear canals were examined using the microscope with  findings above. Cerumen removed from the right external auditory canal using suction and currette with improvement in EAC examination and patency. Left: EAC was patent. TM with approximately 15% perforation. Drainage: none Right: EAC was patent. TM was intact . Middle ear was aerated . Drainage: none Patient tolerated the procedure well.   Impression & Plans:  Preciliano Castell is a 79 y.o. male with the following   Assessment and Plan    Impacted cerumen, right ear  -Ruman impaction removed revealing TM perforation - Instructed him to use Ciprodex  drops drops in the right ear for 7 days. - Scheduled follow-up in 1 week for re-evaluation and further cleaning.  Impacted cerumen, left ear cerumen impaction at the entrance of the left ear canal, possibly reducing hearing aid effectiveness. - Instructed him to use c Ciprodex  drops drops in the left ear for 3 days prior to follow-up. - Scheduled follow-up in 1 week for re-evaluation and cleaning.  Tympanic membrane perforation, right ear Chronic appearing perforation. Full assessment limited by residual cerumen. - Instructed him to use Ciprodex  drops drops to facilitate further cleaning and improved visualization at follow-up. - Planned re-examination of the right tympanic membrane at follow-up to assess the perforation.  Hearing loss Bilateral hearing  loss with hearing aids. Complete right-sided loss likely due to cerumen impaction and possible tympanic membrane perforation. Left-sided worsening may be related to cerumen impaction. - Planned reassessment of hearing after cerumen removal and tympanic membrane evaluation at follow-up. - Planned audiogram after cerumen clearance to assess baseline hearing and impact of tympanic membrane perforation.           - f/u 1 week follow up    Thank you for allowing me the opportunity to care for your patient. Please do not hesitate to contact me should you have any other  questions.  Sincerely, Chyrl Cohen PA-C [facility] Phone: [phone] Fax: [phone]  11/01/2024, 3:39 PM        [1]  Current Outpatient Medications:    Accu-Chek Softclix Lancets lancets, Check bs bid DX: R73.03, Disp: 100 each, Rfl: 12   albuterol  (VENTOLIN  HFA) 108 (90 Base) MCG/ACT inhaler, Inhale 2 puffs into the lungs 3 (three) times daily., Disp: 8 g, Rfl: 1   amLODipine  (NORVASC ) 5 MG tablet, TAKE 1 TABLET(5 MG) BY MOUTH IN THE MORNING AND AT BEDTIME, Disp: 180 tablet, Rfl: 3   ascorbic acid  (VITAMIN C) 500 MG tablet, Take 1 tablet (500 mg total) by mouth daily., Disp: 30 tablet, Rfl: 1   azithromycin  (ZITHROMAX ) 250 MG tablet, 2 tabs poqday1, 1 tab poqday 2-5, Disp: 6 tablet, Rfl: 0   Blood Glucose Monitoring Suppl (ACCU-CHEK GUIDE) w/Device KIT, 1 Units by Does not apply route in the morning and at bedtime., Disp: 1 kit, Rfl: 0   ezetimibe  (ZETIA ) 10 MG tablet, TAKE 1 TABLET(10 MG) BY MOUTH DAILY, Disp: 90 tablet, Rfl: 3   glucose blood (ACCU-CHEK GUIDE) test strip, Check bs bid DX: R73.03, Disp: 100 each, Rfl: 3   Lancets Misc. (ACCU-CHEK SOFTCLIX LANCET DEV) KIT, Check bs bid DX: R73.03, Disp: 1 kit, Rfl: 0   metoprolol  succinate (TOPROL -XL) 50 MG 24 hr tablet, TAKE 1 TABLET(50 MG) BY MOUTH DAILY WITH OR IMMEDIATELY FOLLOWING A MEAL, Disp: 90 tablet, Rfl: 3   Multiple Vitamin (MULTIVITAMIN WITH MINERALS) TABS tablet, Take 1 tablet by mouth daily., Disp: 30 tablet, Rfl: 1   OXYGEN , Inhale 3 L into the lungs. Continous, Disp: , Rfl:    predniSONE  (DELTASONE ) 20 MG tablet, Take 2 tablets (40 mg total) by mouth daily with breakfast., Disp: 14 tablet, Rfl: 0   valsartan  (DIOVAN ) 160 MG tablet, Take 1 tablet (160 mg total) by mouth 2 (two) times daily., Disp: 180 tablet, Rfl: 2   zolpidem  (AMBIEN ) 5 MG tablet, Take 1 tablet (5 mg total) by mouth at bedtime as needed for sleep., Disp: 30 tablet, Rfl: 2   zolpidem  (AMBIEN ) 5 MG tablet, Take 1 tablet (5 mg total)  by mouth at bedtime as needed for sleep., Disp: 30 tablet, Rfl: 3   ofloxacin  (FLOXIN ) 0.3 % OTIC solution, Place 5 drops into both ears daily for 7 days., Disp: 5 mL, Rfl: 0

## 2024-11-08 ENCOUNTER — Ambulatory Visit: Payer: Self-pay

## 2024-11-08 ENCOUNTER — Ambulatory Visit (INDEPENDENT_AMBULATORY_CARE_PROVIDER_SITE_OTHER): Admitting: Physician Assistant

## 2024-11-08 ENCOUNTER — Encounter (INDEPENDENT_AMBULATORY_CARE_PROVIDER_SITE_OTHER): Payer: Self-pay | Admitting: Physician Assistant

## 2024-11-08 VITALS — BP 123/78 | HR 67

## 2024-11-08 DIAGNOSIS — H9193 Unspecified hearing loss, bilateral: Secondary | ICD-10-CM

## 2024-11-08 DIAGNOSIS — H7291 Unspecified perforation of tympanic membrane, right ear: Secondary | ICD-10-CM | POA: Diagnosis not present

## 2024-11-08 MED ORDER — CIPROFLOXACIN-DEXAMETHASONE 0.3-0.1 % OT SUSP: 4.0000 [drp] | Freq: Two times a day (BID) | OTIC | 0 refills | Status: AC

## 2024-11-08 NOTE — Progress Notes (Signed)
 Dear Dr. Duanne, Here is my assessment for our mutual patient, Mike Davila. Thank you for allowing me the opportunity to care for your patient. Please do not hesitate to contact me should you have any other questions. Sincerely, Chyrl Cohen PA-C  Otolaryngology Clinic Note Referring provider: Dr. Duanne HPI:  Mike Davila is a 79 y.o. male kindly referred by Dr. Duanne   Discussed the use of AI scribe software for clinical note transcription with the patient, who gave verbal consent to proceed.  History of Present Illness    Mike Davila is a 79 year old male with chronic bilateral hearing loss and recurrent right tympanic membrane perforation who presents with acute worsening of right ear hearing loss.  The patient was last seen in the office on 11/01/2024 below is recap of the encounter.   He reports complete right aural obstruction and anacusis for approximately ten days, with progressive otalgia and associated jaw discomfort. Communication has become difficult, requiring his companion to speak loudly.   Attempts at right cerumen removal by his primary care provider exacerbated his symptoms. He recalls a prior episode twenty years ago when cerumen removal by an otolaryngologist resulted in significant otorrhagia and skin trauma, leading him to avoid further procedures and request minimal pressure during removal.   He uses hearing aids and estimates a significant degree of hearing loss. He has not worn the right hearing aid recently due to pain with insertion, including discomfort during fitting. The left hearing aid was effective until three days ago, when he noted decreased hearing despite increased volume, which he attributes to cerumen.   He describes chronic right aural irritation and tenderness, especially with pressure near the tympanic membrane. The only episode of otorrhagia occurred during the prior cerumen removal twenty years ago.   He has involuntary head  tremors that cause head movement during procedures. He denies vertigo or presyncope.      Update 11/08/2024   He reports longstanding bilateral hearing loss, with a recent acute worsening of right ear hearing following the use of Foxin ear drops. He began using the drops in the right ear on Friday night, repeated the dose Saturday night, and by Sunday morning was unable to hear out of the right ear. After a three-day interval, he used the drops in the left ear and experienced a similar acute decrease in hearing after the second application. Prior to using the drops, he was able to hear better in both ears, even more so than with hearing aids. He describes a sensation of blockage after instilling the drops, stating the ear felt stopped up and that cerumen was present but did not completely occlude hearing until the drops were used.  He experiences persistent right ear pain, described as severe and tender, with radiation down from the ear. Both ears are hypersensitive, with significant discomfort during cleaning or manipulation. He notes that the pain is constant and that both ears are super sensitive, making procedures particularly uncomfortable.  He has a history of recurrent otitis externa and chronic cerumen impaction, with difficulty using hearing aids in the right ear due to wax pressing against the tympanic membrane. He has not been able to use the right hearing aid more than once or twice in the past two months. He reports that he was told by another provider that he had 25% hearing loss on audiology testing two months ago, but he felt that cerumen impaction was limiting the effectiveness of hearing aids. Repeated attempts to have the  cerumen removed prior to this visit were unsuccessful, and hearing aids have not improved his hearing, with no difference in sound quality whether the aids are in or out. Increasing the volume on his phone or TV has not provided benefit.  He has used approximately  35 drops of Foxin in the right ear over seven days and 15 drops in the left ear, and expresses concern that the provided bottle was insufficient for the prescribed course.           Independent Review of Additional Tests or Records:  None   PMH/Meds/All/SocHx/FamHx/ROS:   Past Medical History:  Diagnosis Date   Anemia    Benign localized prostatic hyperplasia with lower urinary tract symptoms (LUTS)    CKD (chronic kidney disease), stage III [facility])    nephrologist-  dr edwardo ([facility] in [place])-- per pt last visit 06/ 2019   Diverticulosis of colon    ED (erectile dysfunction)    History of acute gouty arthritis 10/23/2017   History of acute renal failure 10/23/2017   hospital admission-- w/ acute tubular necrosis superimposed on ckd 3--- resolved   History of diverticulitis of colon 10/05/2017   admission-- mild-- resolved w/ medical management   History of metabolic acidosis 10/23/2017   admission-- resolved   History of palpitations    approx. 2009, per pt no issue since   History of urinary retention 10/2017   Hypertension    Incomplete right bundle branch block    Mixed hyperlipidemia    Nephrolithiasis    left side nonobstructive   Pre-diabetes    Rheumatoid arthritis (HCC)    Right ureteral stone    Wears dentures    upper     Past Surgical History:  Procedure Laterality Date   ANKLE SURGERY Bilateral 1990s to early 2000s   removal bone spurs   APPENDECTOMY  child   COLONOSCOPY N/A 10/27/2017   Procedure: COLONOSCOPY;  Surgeon: Golda Claudis PENNER, MD;  [facility];  Service: Endoscopy;  Laterality: N/A;   CYSTOSCOPY/URETEROSCOPY/HOLMIUM LASER/STENT PLACEMENT Right 05/09/2018   Procedure: CYSTOSCOPY RIGHT URETEROSCOPY/HOLMIUM LASER/STENT PLACEMENT;  Surgeon: Cam Morene ORN, MD;  Location: [facility];  Service: Urology;  Laterality: Right;   HEMATOMA EVACUATION  1990s   right calf   INGUINAL HERNIA REPAIR Bilateral age 45s    PLANTAR FASCIA SURGERY Left 2004   TONSILLECTOMY  child   TRANSTHORACIC ECHOCARDIOGRAM  10/24/2017   ef 60-65%,  grade 1 diastolic dysfunction    Family History  Problem Relation Age of Onset   Thyroid  cancer Mother    Heart disease Father        MI   Colon cancer Father    Melanoma Father    Benign prostatic hyperplasia Brother    Diabetes Mellitus II Maternal Grandmother    Ovarian cancer Daughter      Social Connections: Moderately Isolated (09/29/2024)   Social Connection and Isolation Panel    Frequency of Communication with Friends and Family: More than three times a week    Frequency of Social Gatherings with Friends and Family: Three times a week    Attends Religious Services: Patient declined    Active Member of Clubs or Organizations: No    Attends Engineer, Structural: Not on file    Marital Status: Married     Current Medications[1]   Physical Exam:   BP 123/78   Pulse 67   SpO2 95%   Pertinent Findings  CN II-XII grossly intact  Right EAC clear, TM with approximately 15% anterior TM perforation with clean edges, the tympanic membrane has dense white scar tissue throughout, this does extend into the external auditory canal, he also has some friable granulation appearing tissue along the TM as well, no purulence, left EAC clear, TM is intact No obviously palpable neck masses/lymphadenopathy/thyromegaly No respiratory distress or stridor   Seprately Identifiable Procedures:  None  Impression & Plans:  Mike Davila is a 79 y.o. male with the following   Assessment and Plan    Chronic right tympanic membrane perforation with hearing loss Chronic perforation with scar tissue and friable tissue likely due to prolonged cerumen impaction and inflammation. Hearing loss is both conductive and possibly sensorineural. Further evaluation needed for hearing amplification or surgery candidacy. - Ordered audiometric evaluation to assess degree and type of  hearing loss. - Will review audiometric results prior to considering further imaging or intervention. - Discussed potential need for CT temporal bone depending on audiometric findings. - Discussed management options including hearing aids or surgical intervention, pending further evaluation. - Prescribed additional week of ciprodex  ear drops for continued softening and clearance of debris. - Sent prescription for additional ear drops to pharmacy.          - f/u phone call visit with audio results    Thank you for allowing me the opportunity to care for your patient. Please do not hesitate to contact me should you have any other questions.  Sincerely, Chyrl Cohen PA-C [facility] Phone: [phone] Fax: [phone]  11/08/2024, 12:13 PM         [1]  Current Outpatient Medications:    Accu-Chek Softclix Lancets lancets, Check bs bid DX: R73.03, Disp: 100 each, Rfl: 12   albuterol  (VENTOLIN  HFA) 108 (90 Base) MCG/ACT inhaler, Inhale 2 puffs into the lungs 3 (three) times daily., Disp: 8 g, Rfl: 1   amLODipine  (NORVASC ) 5 MG tablet, TAKE 1 TABLET(5 MG) BY MOUTH IN THE MORNING AND AT BEDTIME, Disp: 180 tablet, Rfl: 3   ascorbic acid  (VITAMIN C) 500 MG tablet, Take 1 tablet (500 mg total) by mouth daily., Disp: 30 tablet, Rfl: 1   azithromycin  (ZITHROMAX ) 250 MG tablet, 2 tabs poqday1, 1 tab poqday 2-5, Disp: 6 tablet, Rfl: 0   Blood Glucose Monitoring Suppl (ACCU-CHEK GUIDE) w/Device KIT, 1 Units by Does not apply route in the morning and at bedtime., Disp: 1 kit, Rfl: 0   ciprofloxacin -dexamethasone  (CIPRODEX ) OTIC suspension, Place 4 drops into the right ear 2 (two) times daily., Disp: 7.5 mL, Rfl: 0   ezetimibe  (ZETIA ) 10 MG tablet, TAKE 1 TABLET(10 MG) BY MOUTH DAILY, Disp: 90 tablet, Rfl: 3   glucose blood (ACCU-CHEK GUIDE) test strip, Check bs bid DX: R73.03, Disp: 100 each, Rfl: 3   Lancets Misc. (ACCU-CHEK SOFTCLIX LANCET DEV) KIT, Check bs bid DX: R73.03,  Disp: 1 kit, Rfl: 0   metoprolol  succinate (TOPROL -XL) 50 MG 24 hr tablet, TAKE 1 TABLET(50 MG) BY MOUTH DAILY WITH OR IMMEDIATELY FOLLOWING A MEAL, Disp: 90 tablet, Rfl: 3   Multiple Vitamin (MULTIVITAMIN WITH MINERALS) TABS tablet, Take 1 tablet by mouth daily., Disp: 30 tablet, Rfl: 1   ofloxacin  (FLOXIN ) 0.3 % OTIC solution, Place 5 drops into both ears daily for 7 days., Disp: 5 mL, Rfl: 0   OXYGEN , Inhale 3 L into the lungs. Continous, Disp: , Rfl:    predniSONE  (DELTASONE ) 20 MG tablet, Take 2 tablets (40 mg total) by mouth daily with breakfast., Disp:  14 tablet, Rfl: 0   valsartan  (DIOVAN ) 160 MG tablet, Take 1 tablet (160 mg total) by mouth 2 (two) times daily., Disp: 180 tablet, Rfl: 2   zolpidem  (AMBIEN ) 5 MG tablet, Take 1 tablet (5 mg total) by mouth at bedtime as needed for sleep., Disp: 30 tablet, Rfl: 2   zolpidem  (AMBIEN ) 5 MG tablet, Take 1 tablet (5 mg total) by mouth at bedtime as needed for sleep., Disp: 30 tablet, Rfl: 3

## 2024-11-08 NOTE — Telephone Encounter (Signed)
 FYI Only or Action Required?: Action required by provider: request for appointment.  Patient was last seen in primary care on 09/30/2024 by Duanne Butler DASEN, MD.  Called Nurse Triage reporting Arm Injury.  Symptoms began 6 days ago.  Interventions attempted: Nothing.  Symptoms are: gradually worsening.  Triage Disposition: See Physician Within 24 Hours  Patient/caregiver understands and will follow disposition?: No, refuses disposition   Reason for Disposition  [1] Swelling is painful to touch AND [2] no fever  Answer Assessment - Initial Assessment Questions Additional info: Offered only available pcp appointment this morning but unfortunately patient has a specialist appointment at the same time. He is requesting work in to schedule with pcp this afternoon.  Refuses urgent care.   1. APPEARANCE of SWELLING: What does it look like?     Knot 2. SIZE: How large is the swelling? (e.g., inches, cm; or compare to size of pinhead, tip of pen, eraser, coin, pea, grape, ping pong ball)      Small-finger tip size  3. LOCATION: Where is the swelling located?     Under arm, arm pit 4. ONSET: When did the swelling start?     5-6 days 5. COLOR: What color is it? Is there more than one color?     Flesh colored with surrounding redness at boarders 6. PAIN: Is there any pain? If Yes, ask: How bad is the pain? (Scale 1-10; or mild, moderate, severe)       Tender to touch 7. ITCH: Does it itch? If Yes, ask: How bad is the itch?      Denies  8. CAUSE: What do you think caused the swelling?     Had a stick under arm bracing self-leaning on it.  9 OTHER SYMPTOMS: Do you have any other symptoms? (e.g., fever)    Slight redness. Denies fever  Protocols used: Skin Lump or Localized Swelling-A-AH  Message from [place] F sent at 11/08/2024  8:45 AM EST  Reason for Triage: Sore to touch/swelling/knot under arm pit. Requesting 4pm appt today

## 2024-11-11 ENCOUNTER — Encounter: Payer: Self-pay | Admitting: Family Medicine

## 2024-11-11 ENCOUNTER — Ambulatory Visit: Admitting: Family Medicine

## 2024-11-11 ENCOUNTER — Ambulatory Visit (HOSPITAL_COMMUNITY)
Admission: RE | Admit: 2024-11-11 | Discharge: 2024-11-11 | Disposition: A | Discharge status: Home / Self Care | Source: Ambulatory Visit | Attending: Family Medicine | Admitting: Family Medicine

## 2024-11-11 VITALS — BP 120/62 | HR 70 | Temp 97.9°F | Ht 65.0 in | Wt 189.0 lb

## 2024-11-11 DIAGNOSIS — R6884 Jaw pain: Secondary | ICD-10-CM

## 2024-11-11 DIAGNOSIS — M064 Inflammatory polyarthropathy: Secondary | ICD-10-CM | POA: Insufficient documentation

## 2024-11-11 MED ORDER — PREDNISONE 20 MG PO TABS: ORAL_TABLET | ORAL | 0 refills | Status: AC

## 2024-11-11 NOTE — Progress Notes (Signed)
 "  Subjective:    Patient ID: Mike Davila, male    DOB: 1946-01-04, 79 y.o.   MRN: [id number] 09/30/24 Patient complains of decreased hearing in his right ear.  On examination today he has severely impacted cerumen in the right auditory canal.  He is requesting that we try to remove this today for him.  Of note he has a history of chronic kidney disease stage III.  He also has a history of prediabetes as well as hypertension.  His blood pressure today is well-controlled at 120/86.  He denies any chest pain or shortness of breath.  He is overdue for a flu shot but he declines this today.  He is also overdue for fasting labs.  He agrees to get this done today  11/11/24 We were unable to remove the cerumen impaction from his right ear.  It was hurting him too much.  Therefore we sent him to ENT.  ENT was able to remove the wax and under the wax they discovered that the patient had a hole in his tympanic membrane.  He has an appointment to see an audiologist for hearing evaluation this week.  However he complains of pain in his right mandible.  The pain is located near the TMJ joint but radiates down the mandible towards the angle of the mandible.  He states that he hurt it over Christmas while trying to eat a cherry.  He opened his mouth wide and he felt a popping sensation in his jaw.  Ever since then he had a difficult time opening his jaw.  He reports pain with chewing.  Examination of the oropharynx reveals no lesions in the mouth.  The patient has dentures and therefore has no teeth.  I am able to reproduce the pain however by palpation of the mandible just above the angle.  There is no lymphadenopathy Past Medical History:  Diagnosis Date   Anemia    Benign localized prostatic hyperplasia with lower urinary tract symptoms (LUTS)    CKD (chronic kidney disease), stage III [facility])    nephrologist-  dr edwardo ([facility] in [place])-- per pt last visit 06/ 2019   Diverticulosis of colon    ED  (erectile dysfunction)    History of acute gouty arthritis 10/23/2017   History of acute renal failure 10/23/2017   hospital admission-- w/ acute tubular necrosis superimposed on ckd 3--- resolved   History of diverticulitis of colon 10/05/2017   admission-- mild-- resolved w/ medical management   History of metabolic acidosis 10/23/2017   admission-- resolved   History of palpitations    approx. 2009, per pt no issue since   History of urinary retention 10/2017   Hypertension    Incomplete right bundle branch block    Mixed hyperlipidemia    Nephrolithiasis    left side nonobstructive   Pre-diabetes    Rheumatoid arthritis (HCC)    Right ureteral stone    Wears dentures    upper    Past Surgical History:  Procedure Laterality Date   ANKLE SURGERY Bilateral 1990s to early 2000s   removal bone spurs   APPENDECTOMY  child   COLONOSCOPY N/A 10/27/2017   Procedure: COLONOSCOPY;  Surgeon: Golda Claudis PENNER, MD;  [facility];  Service: Endoscopy;  Laterality: N/A;   CYSTOSCOPY/URETEROSCOPY/HOLMIUM LASER/STENT PLACEMENT Right 05/09/2018   Procedure: CYSTOSCOPY RIGHT URETEROSCOPY/HOLMIUM LASER/STENT PLACEMENT;  Surgeon: Cam Morene ORN, MD;  Location: [facility];  Service: Urology;  Laterality: Right;  HEMATOMA EVACUATION  1990s   right calf   INGUINAL HERNIA REPAIR Bilateral age 39s   PLANTAR FASCIA SURGERY Left 2004   TONSILLECTOMY  child   TRANSTHORACIC ECHOCARDIOGRAM  10/24/2017   ef 60-65%,  grade 1 diastolic dysfunction   Current Outpatient Medications on File Prior to Visit  Medication Sig Dispense Refill   Accu-Chek Softclix Lancets lancets Check bs bid DX: R73.03 100 each 12   albuterol  (VENTOLIN  HFA) 108 (90 Base) MCG/ACT inhaler Inhale 2 puffs into the lungs 3 (three) times daily. 8 g 1   amLODipine  (NORVASC ) 5 MG tablet TAKE 1 TABLET(5 MG) BY MOUTH IN THE MORNING AND AT BEDTIME 180 tablet 3   ascorbic acid  (VITAMIN C) 500 MG tablet Take 1  tablet (500 mg total) by mouth daily. 30 tablet 1   azithromycin  (ZITHROMAX ) 250 MG tablet 2 tabs poqday1, 1 tab poqday 2-5 6 tablet 0   Blood Glucose Monitoring Suppl (ACCU-CHEK GUIDE) w/Device KIT 1 Units by Does not apply route in the morning and at bedtime. 1 kit 0   ezetimibe  (ZETIA ) 10 MG tablet TAKE 1 TABLET(10 MG) BY MOUTH DAILY 90 tablet 3   glucose blood (ACCU-CHEK GUIDE) test strip Check bs bid DX: R73.03 100 each 3   Lancets Misc. (ACCU-CHEK SOFTCLIX LANCET DEV) KIT Check bs bid DX: R73.03 1 kit 0   metoprolol  succinate (TOPROL -XL) 50 MG 24 hr tablet TAKE 1 TABLET(50 MG) BY MOUTH DAILY WITH OR IMMEDIATELY FOLLOWING A MEAL 90 tablet 3   Multiple Vitamin (MULTIVITAMIN WITH MINERALS) TABS tablet Take 1 tablet by mouth daily. 30 tablet 1   OXYGEN  Inhale 3 L into the lungs. Continous     valsartan  (DIOVAN ) 160 MG tablet Take 1 tablet (160 mg total) by mouth 2 (two) times daily. 180 tablet 2   zolpidem  (AMBIEN ) 5 MG tablet Take 1 tablet (5 mg total) by mouth at bedtime as needed for sleep. 30 tablet 2   zolpidem  (AMBIEN ) 5 MG tablet Take 1 tablet (5 mg total) by mouth at bedtime as needed for sleep. 30 tablet 3   ciprofloxacin -dexamethasone  (CIPRODEX ) OTIC suspension Place 4 drops into the right ear 2 (two) times daily. 7.5 mL 0   No current facility-administered medications on file prior to visit.   Allergies  Allergen Reactions   Sulfa Antibiotics Hives and Shortness Of Breath   Crestor [Rosuvastatin Calcium ] Other (See Comments)    Leg cramps   Lipitor [Atorvastatin  Calcium ] Other (See Comments)    Leg cramps   Tetanus Toxoid-Containing Vaccines Other (See Comments)    Unknown reaction   Social History   Socioeconomic History   Marital status: Married    Spouse name: Not on file   Number of children: Not on file   Years of education: Not on file   Highest education level: 12th grade  Occupational History   Not on file  Tobacco Use   Smoking status: Former    Current  packs/day: 0.00    Types: Cigarettes    Start date: 05/04/1986    Quit date: 05/04/2006    Years since quitting: 18.5   Smokeless tobacco: Former    Types: Chew    Quit date: 05/04/2017  Vaping Use   Vaping status: Never Used  Substance and Sexual Activity   Alcohol use: No   Drug use: No   Sexual activity: Yes  Other Topics Concern   Not on file  Social History Narrative   Married.   [employer].  Social Drivers of Health   Tobacco Use: Medium Risk (11/11/2024)   Patient History    Smoking Tobacco Use: Former    Smokeless Tobacco Use: Former    Passive Exposure: Not on Actuary Strain: Low Risk (09/29/2024)   Overall Financial Resource Strain (CARDIA)    Difficulty of Paying Living Expenses: Not hard at all  Food Insecurity: No Food Insecurity (09/29/2024)   Epic    Worried About Programme Researcher, Broadcasting/film/video in the Last Year: Never true    Ran Out of Food in the Last Year: Never true  Transportation Needs: No Transportation Needs (09/29/2024)   Epic    Lack of Transportation (Medical): No    Lack of Transportation (Non-Medical): No  Physical Activity: Inactive (09/29/2024)   Exercise Vital Sign    Days of Exercise per Week: 0 days    Minutes of Exercise per Session: Not on file  Stress: No Stress Concern Present (09/29/2024)   [employer] of Occupational Health - Occupational Stress Questionnaire    Feeling of Stress: Not at all  Social Connections: Moderately Isolated (09/29/2024)   Social Connection and Isolation Panel    Frequency of Communication with Friends and Family: More than three times a week    Frequency of Social Gatherings with Friends and Family: Three times a week    Attends Religious Services: Patient declined    Active Member of Clubs or Organizations: No    Attends Banker Meetings: Not on file    Marital Status: Married  Intimate Partner Violence: Not At Risk (01/09/2023)   Received from [facility]   HITS    Over the  last 12 months how often did your partner physically hurt you?: Never    Over the last 12 months how often did your partner insult you or talk down to you?: Never    Over the last 12 months how often did your partner threaten you with physical harm?: Never    Over the last 12 months how often did your partner scream or curse at you?: Never  Depression (PHQ2-9): Low Risk (11/11/2024)   Depression (PHQ2-9)    PHQ-2 Score: 0  Alcohol Screen: Low Risk (08/17/2022)   Alcohol Screen    Last Alcohol Screening Score (AUDIT): 0  Housing: Low Risk (09/29/2024)   Epic    Unable to Pay for Housing in the Last Year: No    Number of Times Moved in the Last Year: 0    Homeless in the Last Year: No  Utilities: Not At Risk (01/09/2023)   Received from [facility] Utilities    Threatened with loss of utilities: No  Health Literacy: Not on file   Family History  Problem Relation Age of Onset   Thyroid  cancer Mother    Heart disease Father        MI   Colon cancer Father    Melanoma Father    Benign prostatic hyperplasia Brother    Diabetes Mellitus II Maternal Grandmother    Ovarian cancer Daughter     Review of Systems  Respiratory:  Positive for cough.        Objective:   Physical Exam Vitals reviewed.  Constitutional:      General: He is not in acute distress.    Appearance: Normal appearance. He is normal weight. He is not ill-appearing or toxic-appearing.  HENT:     Head:     Jaw: Tenderness and pain on movement  present. No trismus or swelling.      Right Ear: Tympanic membrane is perforated.     Left Ear: There is no impacted cerumen.  Cardiovascular:     Rate and Rhythm: Normal rate and regular rhythm.     Heart sounds: Normal heart sounds. No murmur heard.    No gallop.  Pulmonary:     Effort: Pulmonary effort is normal. No tachypnea or respiratory distress.     Breath sounds: No stridor or decreased air movement. No decreased breath sounds, wheezing, rhonchi or  rales.  Skin:    Findings: No rash.  Neurological:     General: No focal deficit present.     Mental Status: He is alert and oriented to person, place, and time. Mental status is at baseline.     Cranial Nerves: No cranial nerve deficit.        Assessment & Plan:  Jaw pain - Plan: DG Mandible 4 Views I believe this could be TMJ.  Patient has been taking Advil excessively.  He has chronic kidney disease and explained to him this will destroy his kidney function.  He needs to stop the Advil immediately.  We will start with prednisone  taper pack for inflammation and I will obtain an x-ray of the mandible "

## 2024-11-12 ENCOUNTER — Telehealth (INDEPENDENT_AMBULATORY_CARE_PROVIDER_SITE_OTHER): Payer: Self-pay | Admitting: Physician Assistant

## 2024-11-12 ENCOUNTER — Ambulatory Visit (INDEPENDENT_AMBULATORY_CARE_PROVIDER_SITE_OTHER)

## 2024-11-12 DIAGNOSIS — H7291 Unspecified perforation of tympanic membrane, right ear: Secondary | ICD-10-CM

## 2024-11-12 DIAGNOSIS — H90A31 Mixed conductive and sensorineural hearing loss, unilateral, right ear with restricted hearing on the contralateral side: Secondary | ICD-10-CM

## 2024-11-12 DIAGNOSIS — H90A22 Sensorineural hearing loss, unilateral, left ear, with restricted hearing on the contralateral side: Secondary | ICD-10-CM

## 2024-11-12 NOTE — Progress Notes (Signed)
 " [address] [place], [place] [address] [phone]  Audiological Evaluation    Name: Mike Davila     DOB:   [birth year]      MRN:   [id number]                                                                                     Service Date: 11/12/2024     Accompanied by: wife   Patient comes today after Reyes Cohen, PA-C sent a referral for a hearing evaluation due to concerns with hearing loss.   Symptoms Yes Details  Hearing loss  [x]  Has had difficulty with hearing for many years with right ear cerumen removed in the past causing bleeding. Patient was told he had a right ear eardrum perforation.  Prescribed ear drops but now drops are causing it to be more difficult to hear.    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  Headaches.  No sinus or allergy issues    Otoscopy: Right ear: Abnormal eardrum appearance. Left ear:  Occluding cerumen, unable to visualize notable tympanic membrane landmarks.  Tympanometry: Right ear: Type B - Large external ear canal volume with no middle ear pressure peak or tympanic membrane compliance. Findings are consistent with a tympanic membrane perforation or presence of a pressure equalization (PE) tube. Left ear: Type A - Normal external ear canal volume with normal middle ear pressure and normal tympanic membrane compliance. Findings are consistent with normal middle ear function.  Hearing Evaluation The hearing test results were completed under headphones and results are deemed to be of good  reliability. Test technique:  conventional    Pure tone Audiometry: Right ear- Moderately-severe rising to mild at 500 Hz conductive hearing loss and moderate sloping to profound mixed hearing loss from 750 Hz - 8000 Hz. Left ear-  Normal hearing through 1000 Hz and then moderately-severe sloping to severe sensorineural hearing loss from 1500 Hz - 8000 Hz.  Speech Audiometry: Right ear- Speech Reception Threshold (SRT) was obtained at 50 dBHL. Left ear-Speech Reception Threshold (SRT) was obtained at 30 dBHL.   Word Recognition Score Tested using NU-6 (recorded) Right ear: 52% was obtained at a presentation level of 90 dBHL with contralateral masking which is deemed as  poor. Left ear: 84% was obtained at a presentation level of 75 dBHL with contralateral masking which is deemed as  good .   Impression: There is a significant difference in pure-tone thresholds between ears., There is a significant difference in word recognition scores , worse of the right ear.     Recommendations: Follow up with ENT as scheduled. Return for a hearing evaluation if concerns with  hearing changes arise or per MD recommendation.   Arlean Rake, AUD  "

## 2024-11-12 NOTE — Telephone Encounter (Signed)
 I reviewed the patient's audiological results he has a very significant right-sided conductive hearing loss.  This coincides with his physical exam.  I would like to proceed with CT temporal bone for further evaluation.  Once the results are available I will call the patient.  He agrees to today's plan and had no further questions or concerns.

## 2024-11-14 ENCOUNTER — Ambulatory Visit (HOSPITAL_COMMUNITY)
Admission: RE | Admit: 2024-11-14 | Discharge: 2024-11-14 | Disposition: A | Discharge status: Home / Self Care | Source: Ambulatory Visit | Attending: Physician Assistant | Admitting: Physician Assistant

## 2024-11-14 DIAGNOSIS — H90A31 Mixed conductive and sensorineural hearing loss, unilateral, right ear with restricted hearing on the contralateral side: Secondary | ICD-10-CM | POA: Insufficient documentation

## 2024-11-14 DIAGNOSIS — H7291 Unspecified perforation of tympanic membrane, right ear: Secondary | ICD-10-CM | POA: Diagnosis present

## 2024-11-15 ENCOUNTER — Ambulatory Visit (INDEPENDENT_AMBULATORY_CARE_PROVIDER_SITE_OTHER): Admitting: Physician Assistant

## 2024-11-15 ENCOUNTER — Ambulatory Visit: Payer: Self-pay | Admitting: Family Medicine

## 2024-11-15 ENCOUNTER — Telehealth (INDEPENDENT_AMBULATORY_CARE_PROVIDER_SITE_OTHER): Payer: Self-pay | Admitting: Physician Assistant

## 2024-11-15 ENCOUNTER — Telehealth: Payer: Self-pay

## 2024-11-15 NOTE — Telephone Encounter (Signed)
 Thanks

## 2024-11-15 NOTE — Telephone Encounter (Signed)
 [name] called at 9:47 am on Friday 11/15/2024 and wanted to let [name] know that he had his CT scan done on yesterday at [facility] and if Mr. Mike needed to call him he will be available anytime before 1 pm today

## 2024-11-15 NOTE — Telephone Encounter (Signed)
 Copied from CRM #[id number]. Topic: Clinical - Lab/Test Results >> Nov 15, 2024  9:23 AM Treva T wrote: Reason for CRM: Pt spouse, Rock, calling to inquire on imaging results.  Requesting a return call to discuss imaging results.   Can be reached at spouse number,  [phone], or [phone]   Aware of follow up call back.

## 2024-11-15 NOTE — Telephone Encounter (Signed)
 Pt had x-ray done of jaw and then was referred for a CT scan due to a mass that was seen on the x-ray. I called [facility] to have them expedite the readings. Thank you.

## 2024-11-21 ENCOUNTER — Ambulatory Visit (INDEPENDENT_AMBULATORY_CARE_PROVIDER_SITE_OTHER): Admitting: Physician Assistant

## 2024-11-21 ENCOUNTER — Encounter (INDEPENDENT_AMBULATORY_CARE_PROVIDER_SITE_OTHER): Payer: Self-pay | Admitting: Physician Assistant

## 2024-11-21 VITALS — BP 115/76 | HR 66

## 2024-11-21 DIAGNOSIS — H7291 Unspecified perforation of tympanic membrane, right ear: Secondary | ICD-10-CM | POA: Diagnosis not present

## 2024-11-21 DIAGNOSIS — H9191 Unspecified hearing loss, right ear: Secondary | ICD-10-CM

## 2024-11-21 NOTE — Progress Notes (Signed)
 Dear Dr. Duanne, Here is my assessment for our mutual patient, Mike Davila. Thank you for allowing me the opportunity to care for your patient. Please do not hesitate to contact me should you have any other questions. Sincerely, Chyrl Cohen PA-C  Otolaryngology Clinic Note Referring provider: Dr. Duanne HPI:  Mike Davila is a 79 y.o. male kindly referred by Dr. Duanne   Discussed the use of AI scribe software for clinical note transcription with the patient, who gave verbal consent to proceed.  History of Present Illness   Mike Davila is a 79 year old male with chronic right ear disease, tympanic membrane perforation, and hearing loss who presents for evaluation of longstanding right ear canal and middle ear abnormalities.  The patient was last seen in the office on 11/08/2024.  His last office visit he described a approximate 16-year history of right ear issues.  He notes he saw ENT specialist for cerumen impaction in his right ear.  He notes that he had cerumen removed and had excruciating pain at that time.  He notes since that time he has had ongoing pain with any attempt to clean his ear with a Q-tip, this is significantly abnormal for him.  He denied any significant hearing loss at that time.  He notes an accumulation of wax over the years that has not been removed.  I saw him in the office on 11/01/2014 and was able to remove majority of the wax although he had significant scar tissue and some ongoing accumulation.  He was again seen on 11/08/2024 where some more of the wax was removed.  He had significant abnormality of the TM at that time.  CT scan was ordered.  The patient denied any previous trauma to his ear, no history of ear surgeries or reoccurring ear infections.  He notes since the wax was removed and he was placed on otic drops his hearing has significantly decreased from its baseline.  CT results were reported as postoperative changes of canal wall down right  mastoidectomy and right tympanoplasty, with possible inferior tympanic membrane perforation and mild soft tissue thickening, as well as trace fluid in the inferior right mastoid air cells. He denies any history of otologic surgery. His symptoms have been longstanding and progressive, without recent acute changes. He retains limited hearing in the left ear.   Update 11/21/2024-  Today's office visit he denies any significant changes.  He notes persistent decreased hearing out of the right ear, no drainage, no significant pain.  He again reiterates he has never had any trauma or surgery to the right ear.          Independent Review of Additional Tests or Records:  CT temporal bone 11/14/2024  IMPRESSION: 1. Interval postoperative changes of canal wall down right mastoidectomy with mild soft tissue thickening along the inferior aspect of the mastoidectomy bowl and the lateral aspect of the mastoid antrum and aditus ad antrum. 2. Postoperative changes of right tympanoplasty with possible inferior perforation, with intact ossicles.  PMH/Meds/All/SocHx/FamHx/ROS:   Past Medical History:  Diagnosis Date   Anemia    Benign localized prostatic hyperplasia with lower urinary tract symptoms (LUTS)    CKD (chronic kidney disease), stage III [facility])    nephrologist-  dr edwardo ([facility] in [place])-- per pt last visit 06/ 2019   Diverticulosis of colon    ED (erectile dysfunction)    History of acute gouty arthritis 10/23/2017   History of acute renal failure 10/23/2017   hospital  admission-- w/ acute tubular necrosis superimposed on ckd 3--- resolved   History of diverticulitis of colon 10/05/2017   admission-- mild-- resolved w/ medical management   History of metabolic acidosis 10/23/2017   admission-- resolved   History of palpitations    approx. 2009, per pt no issue since   History of urinary retention 10/2017   Hypertension    Incomplete right bundle branch block    Mixed  hyperlipidemia    Nephrolithiasis    left side nonobstructive   Pre-diabetes    Rheumatoid arthritis (HCC)    Right ureteral stone    Wears dentures    upper     Past Surgical History:  Procedure Laterality Date   ANKLE SURGERY Bilateral 1990s to early 2000s   removal bone spurs   APPENDECTOMY  child   COLONOSCOPY N/A 10/27/2017   Procedure: COLONOSCOPY;  Surgeon: Golda Claudis PENNER, MD;  Location: [facility];  Service: Endoscopy;  Laterality: N/A;   CYSTOSCOPY/URETEROSCOPY/HOLMIUM LASER/STENT PLACEMENT Right 05/09/2018   Procedure: CYSTOSCOPY RIGHT URETEROSCOPY/HOLMIUM LASER/STENT PLACEMENT;  Surgeon: Cam Morene ORN, MD;  Location: [facility];  Service: Urology;  Laterality: Right;   HEMATOMA EVACUATION  1990s   right calf   INGUINAL HERNIA REPAIR Bilateral age 52s   PLANTAR FASCIA SURGERY Left 2004   TONSILLECTOMY  child   TRANSTHORACIC ECHOCARDIOGRAM  10/24/2017   ef 60-65%,  grade 1 diastolic dysfunction    Family History  Problem Relation Age of Onset   Thyroid  cancer Mother    Heart disease Father        MI   Colon cancer Father    Melanoma Father    Benign prostatic hyperplasia Brother    Diabetes Mellitus II Maternal Grandmother    Ovarian cancer Daughter      Social Connections: Moderately Isolated (09/29/2024)   Social Connection and Isolation Panel    Frequency of Communication with Friends and Family: More than three times a week    Frequency of Social Gatherings with Friends and Family: Three times a week    Attends Religious Services: Patient declined    Active Member of Clubs or Organizations: No    Attends Engineer, Structural: Not on file    Marital Status: Married     Current Medications[1]   Physical Exam:   BP 115/76   Pulse 66   SpO2 94%   Pertinent Findings  CN II-XII grossly intact Right external auditory canal with no purulence, obvious canal defect along the inferior posterior wall up to the TM which has  deficits along the posterior and anterior aspects, the ossicles do appear intact Anterior rhinoscopy: Septum midline; bilateral inferior turbinates with no hypertrophy No lesions of oral cavity/oropharynx No obviously palpable neck masses/lymphadenopathy/thyromegaly No respiratory distress or stridor  Mike Identifiable Procedures:  Davila  Impression & Plans:  Mike Davila is a 79 y.o. male with the following   Assessment and Plan    Chronic right ear disease with tympanic membrane perforation and hearing loss Severe right ear disease with large tympanic membrane perforation and significant hearing loss. Imaging shows extensive bone erosion and scarring. Etiology unclear; differential includes chronic infection, neoplasm, or other destructive process.   - Referred to surgeon for further evaluation and management. - Advised against use of hearing aids, ear drops, or insertion of any objects into the right ear. - Reviewed CT imaging with him to explain findings and rationale for referral. - Will discuss his case with the ear specialist  next week to expedite evaluation. - Provided reassurance regarding absence of immediate danger or acute complication          - f/u discuss with Dr. Tobie provide recommendations over the phone   Thank you for allowing me the opportunity to care for your patient. Please do not hesitate to contact me should you have any other questions.  Sincerely, Chyrl Cohen PA-C [facility] Phone: [phone] Fax: [phone]  11/21/2024, 2:48 PM        [1]  Current Outpatient Medications:    Accu-Chek Softclix Lancets lancets, Check bs bid DX: R73.03, Disp: 100 each, Rfl: 12   albuterol  (VENTOLIN  HFA) 108 (90 Base) MCG/ACT inhaler, Inhale 2 puffs into the lungs 3 (three) times daily., Disp: 8 g, Rfl: 1   amLODipine  (NORVASC ) 5 MG tablet, TAKE 1 TABLET(5 MG) BY MOUTH IN THE MORNING AND AT BEDTIME, Disp: 180 tablet, Rfl: 3    ascorbic acid  (VITAMIN C) 500 MG tablet, Take 1 tablet (500 mg total) by mouth daily., Disp: 30 tablet, Rfl: 1   azithromycin  (ZITHROMAX ) 250 MG tablet, 2 tabs poqday1, 1 tab poqday 2-5, Disp: 6 tablet, Rfl: 0   Blood Glucose Monitoring Suppl (ACCU-CHEK GUIDE) w/Device KIT, 1 Units by Does not apply route in the morning and at bedtime., Disp: 1 kit, Rfl: 0   ciprofloxacin -dexamethasone  (CIPRODEX ) OTIC suspension, Place 4 drops into the right ear 2 (two) times daily., Disp: 7.5 mL, Rfl: 0   ezetimibe  (ZETIA ) 10 MG tablet, TAKE 1 TABLET(10 MG) BY MOUTH DAILY, Disp: 90 tablet, Rfl: 3   glucose blood (ACCU-CHEK GUIDE) test strip, Check bs bid DX: R73.03, Disp: 100 each, Rfl: 3   Lancets Misc. (ACCU-CHEK SOFTCLIX LANCET DEV) KIT, Check bs bid DX: R73.03, Disp: 1 kit, Rfl: 0   metoprolol  succinate (TOPROL -XL) 50 MG 24 hr tablet, TAKE 1 TABLET(50 MG) BY MOUTH DAILY WITH OR IMMEDIATELY FOLLOWING A MEAL, Disp: 90 tablet, Rfl: 3   Multiple Vitamin (MULTIVITAMIN WITH MINERALS) TABS tablet, Take 1 tablet by mouth daily., Disp: 30 tablet, Rfl: 1   OXYGEN , Inhale 3 L into the lungs. Continous, Disp: , Rfl:    predniSONE  (DELTASONE ) 20 MG tablet, 3 tabs poqday 1-2, 2 tabs poqday 3-4, 1 tab poqday 5-6, Disp: 12 tablet, Rfl: 0   valsartan  (DIOVAN ) 160 MG tablet, Take 1 tablet (160 mg total) by mouth 2 (two) times daily., Disp: 180 tablet, Rfl: 2   zolpidem  (AMBIEN ) 5 MG tablet, Take 1 tablet (5 mg total) by mouth at bedtime as needed for sleep., Disp: 30 tablet, Rfl: 2   zolpidem  (AMBIEN ) 5 MG tablet, Take 1 tablet (5 mg total) by mouth at bedtime as needed for sleep., Disp: 30 tablet, Rfl: 3

## 2024-11-28 ENCOUNTER — Telehealth (INDEPENDENT_AMBULATORY_CARE_PROVIDER_SITE_OTHER): Payer: Self-pay | Admitting: Physician Assistant

## 2024-11-28 NOTE — Telephone Encounter (Signed)
 Discussed the case with Dr. Tobie, he is willing to see the patient in consult.  Patient replaced on his schedule and neck several weeks.  He understands to reach out to me with any questions he may have in the meantime.

## 2024-12-03 ENCOUNTER — Ambulatory Visit (INDEPENDENT_AMBULATORY_CARE_PROVIDER_SITE_OTHER): Admitting: Otolaryngology

## 2024-12-04 ENCOUNTER — Ambulatory Visit
